# Patient Record
Sex: Female | Born: 1955
Health system: Southern US, Community
[De-identification: ages and names within clinical notes are randomized; demographics above are authoritative.]

## PROBLEM LIST (undated history)

## (undated) DIAGNOSIS — I10 Essential (primary) hypertension: Secondary | ICD-10-CM

## (undated) DIAGNOSIS — G43909 Migraine, unspecified, not intractable, without status migrainosus: Secondary | ICD-10-CM

## (undated) DIAGNOSIS — Z87442 Personal history of urinary calculi: Secondary | ICD-10-CM

## (undated) DIAGNOSIS — Z9989 Dependence on other enabling machines and devices: Secondary | ICD-10-CM

## (undated) DIAGNOSIS — Z800 Family history of malignant neoplasm of digestive organs: Secondary | ICD-10-CM

## (undated) DIAGNOSIS — F419 Anxiety disorder, unspecified: Secondary | ICD-10-CM

## (undated) DIAGNOSIS — C649 Malignant neoplasm of unspecified kidney, except renal pelvis: Secondary | ICD-10-CM

## (undated) DIAGNOSIS — R112 Nausea with vomiting, unspecified: Secondary | ICD-10-CM

## (undated) DIAGNOSIS — G4730 Sleep apnea, unspecified: Secondary | ICD-10-CM

## (undated) DIAGNOSIS — K649 Unspecified hemorrhoids: Secondary | ICD-10-CM

## (undated) DIAGNOSIS — Z8051 Family history of malignant neoplasm of kidney: Secondary | ICD-10-CM

## (undated) DIAGNOSIS — J45909 Unspecified asthma, uncomplicated: Secondary | ICD-10-CM

## (undated) DIAGNOSIS — Z808 Family history of malignant neoplasm of other organs or systems: Secondary | ICD-10-CM

## (undated) DIAGNOSIS — E785 Hyperlipidemia, unspecified: Secondary | ICD-10-CM

## (undated) DIAGNOSIS — N189 Chronic kidney disease, unspecified: Secondary | ICD-10-CM

## (undated) DIAGNOSIS — K219 Gastro-esophageal reflux disease without esophagitis: Secondary | ICD-10-CM

## (undated) DIAGNOSIS — R7303 Prediabetes: Secondary | ICD-10-CM

## (undated) DIAGNOSIS — M1990 Unspecified osteoarthritis, unspecified site: Secondary | ICD-10-CM

## (undated) DIAGNOSIS — D649 Anemia, unspecified: Secondary | ICD-10-CM

## (undated) DIAGNOSIS — Z9889 Other specified postprocedural states: Secondary | ICD-10-CM

## (undated) DIAGNOSIS — E042 Nontoxic multinodular goiter: Secondary | ICD-10-CM

## (undated) DIAGNOSIS — C73 Malignant neoplasm of thyroid gland: Secondary | ICD-10-CM

## (undated) DIAGNOSIS — K5792 Diverticulitis of intestine, part unspecified, without perforation or abscess without bleeding: Secondary | ICD-10-CM

## (undated) HISTORY — DX: Family history of malignant neoplasm of other organs or systems: Z80.8

## (undated) HISTORY — DX: Essential (primary) hypertension: I10

## (undated) HISTORY — DX: Nontoxic multinodular goiter: E04.2

## (undated) HISTORY — DX: Family history of malignant neoplasm of kidney: Z80.51

## (undated) HISTORY — DX: Malignant neoplasm of unspecified kidney, except renal pelvis: C64.9

## (undated) HISTORY — DX: Family history of malignant neoplasm of digestive organs: Z80.0

## (undated) HISTORY — DX: Malignant neoplasm of thyroid gland: C73

## (undated) HISTORY — PX: OTHER SURGICAL HISTORY: SHX169

## (undated) HISTORY — PX: NASAL SEPTUM SURGERY: SHX37

## (undated) HISTORY — PX: ABDOMINAL HYSTERECTOMY: SHX81

---

## 1976-09-10 HISTORY — PX: APPENDECTOMY: SHX54

## 1998-05-10 ENCOUNTER — Encounter: Payer: Self-pay | Admitting: *Deleted

## 1998-05-10 ENCOUNTER — Ambulatory Visit (HOSPITAL_COMMUNITY): Admission: RE | Admit: 1998-05-10 | Discharge: 1998-05-10 | Payer: Self-pay | Admitting: *Deleted

## 1998-05-10 ENCOUNTER — Encounter (HOSPITAL_COMMUNITY): Admission: RE | Admit: 1998-05-10 | Discharge: 1998-08-08 | Payer: Self-pay | Admitting: *Deleted

## 2000-07-26 ENCOUNTER — Ambulatory Visit (HOSPITAL_COMMUNITY): Admission: RE | Admit: 2000-07-26 | Discharge: 2000-07-26 | Payer: Self-pay | Admitting: *Deleted

## 2000-08-02 ENCOUNTER — Ambulatory Visit (HOSPITAL_COMMUNITY): Admission: RE | Admit: 2000-08-02 | Discharge: 2000-08-02 | Payer: Self-pay | Admitting: *Deleted

## 2003-09-11 HISTORY — PX: CHOLECYSTECTOMY: SHX55

## 2004-04-27 ENCOUNTER — Ambulatory Visit (HOSPITAL_COMMUNITY): Admission: RE | Admit: 2004-04-27 | Discharge: 2004-04-27 | Payer: Self-pay | Admitting: Internal Medicine

## 2004-05-01 ENCOUNTER — Observation Stay (HOSPITAL_COMMUNITY): Admission: RE | Admit: 2004-05-01 | Discharge: 2004-05-02 | Payer: Self-pay | Admitting: Internal Medicine

## 2004-07-20 ENCOUNTER — Ambulatory Visit (HOSPITAL_COMMUNITY): Admission: RE | Admit: 2004-07-20 | Discharge: 2004-07-20 | Payer: Self-pay | Admitting: Internal Medicine

## 2004-12-28 ENCOUNTER — Ambulatory Visit (HOSPITAL_COMMUNITY): Admission: RE | Admit: 2004-12-28 | Discharge: 2004-12-28 | Payer: Self-pay | Admitting: Internal Medicine

## 2005-05-03 ENCOUNTER — Ambulatory Visit (HOSPITAL_COMMUNITY): Admission: RE | Admit: 2005-05-03 | Discharge: 2005-05-03 | Payer: Self-pay | Admitting: Internal Medicine

## 2005-05-21 ENCOUNTER — Ambulatory Visit: Admission: RE | Admit: 2005-05-21 | Discharge: 2005-05-21 | Payer: Self-pay | Admitting: Internal Medicine

## 2005-05-29 ENCOUNTER — Ambulatory Visit: Payer: Self-pay | Admitting: Pulmonary Disease

## 2005-06-27 ENCOUNTER — Ambulatory Visit: Payer: Self-pay | Admitting: Pulmonary Disease

## 2005-07-25 ENCOUNTER — Ambulatory Visit: Payer: Self-pay | Admitting: Pulmonary Disease

## 2006-02-12 ENCOUNTER — Emergency Department (HOSPITAL_COMMUNITY): Admission: EM | Admit: 2006-02-12 | Discharge: 2006-02-12 | Payer: Self-pay | Admitting: Emergency Medicine

## 2006-05-16 ENCOUNTER — Ambulatory Visit (HOSPITAL_COMMUNITY): Admission: RE | Admit: 2006-05-16 | Discharge: 2006-05-16 | Payer: Self-pay | Admitting: Internal Medicine

## 2006-08-30 ENCOUNTER — Ambulatory Visit (HOSPITAL_COMMUNITY): Admission: RE | Admit: 2006-08-30 | Discharge: 2006-08-30 | Payer: Self-pay | Admitting: Internal Medicine

## 2007-06-05 ENCOUNTER — Ambulatory Visit (HOSPITAL_COMMUNITY): Admission: RE | Admit: 2007-06-05 | Discharge: 2007-06-05 | Payer: Self-pay | Admitting: Internal Medicine

## 2007-08-17 ENCOUNTER — Emergency Department (HOSPITAL_COMMUNITY): Admission: EM | Admit: 2007-08-17 | Discharge: 2007-08-17 | Payer: Self-pay | Admitting: Emergency Medicine

## 2008-06-10 ENCOUNTER — Ambulatory Visit (HOSPITAL_COMMUNITY): Admission: RE | Admit: 2008-06-10 | Discharge: 2008-06-10 | Payer: Self-pay | Admitting: Internal Medicine

## 2009-05-24 ENCOUNTER — Ambulatory Visit (HOSPITAL_COMMUNITY): Admission: RE | Admit: 2009-05-24 | Discharge: 2009-05-25 | Payer: Self-pay | Admitting: Urology

## 2010-07-03 ENCOUNTER — Ambulatory Visit (HOSPITAL_COMMUNITY): Admission: RE | Admit: 2010-07-03 | Discharge: 2010-07-03 | Payer: Self-pay | Admitting: Internal Medicine

## 2010-07-19 ENCOUNTER — Ambulatory Visit (HOSPITAL_COMMUNITY): Admission: RE | Admit: 2010-07-19 | Discharge: 2010-07-19 | Payer: Self-pay | Admitting: Internal Medicine

## 2010-10-01 ENCOUNTER — Encounter: Payer: Self-pay | Admitting: Internal Medicine

## 2010-12-15 LAB — CBC
HCT: 32.9 % — ABNORMAL LOW (ref 36.0–46.0)
Hemoglobin: 11.6 g/dL — ABNORMAL LOW (ref 12.0–15.0)
MCHC: 33.7 g/dL (ref 30.0–36.0)
MCHC: 34 g/dL (ref 30.0–36.0)
MCV: 86.7 fL (ref 78.0–100.0)
MCV: 87 fL (ref 78.0–100.0)
Platelets: 144 10*3/uL — ABNORMAL LOW (ref 150–400)
RBC: 3.78 MIL/uL — ABNORMAL LOW (ref 3.87–5.11)
RDW: 15.8 % — ABNORMAL HIGH (ref 11.5–15.5)

## 2010-12-15 LAB — ABO/RH: ABO/RH(D): O POS

## 2010-12-15 LAB — TYPE AND SCREEN
ABO/RH(D): O POS
Antibody Screen: NEGATIVE

## 2010-12-15 LAB — PROTIME-INR
INR: 0.9 (ref 0.00–1.49)
Prothrombin Time: 12.3 seconds (ref 11.6–15.2)

## 2010-12-15 LAB — BASIC METABOLIC PANEL: Creatinine, Ser: 0.7 mg/dL (ref 0.4–1.2)

## 2011-01-26 NOTE — Procedures (Signed)
Rebecca Scott, Rebecca Scott           ACCOUNT NO.:  [id number]   MEDICAL RECORD NO.:  [id number]          PATIENT TYPE:  OUT   LOCATION:  SLEEP LAB                     FACILITY:  [facility]   PHYSICIAN:  Marcelyn Bruins, M.D. [facility] DATE OF BIRTH:  02-13-1956   DATE OF STUDY:  05/21/2005                              NOCTURNAL POLYSOMNOGRAM   REFERRING PHYSICIAN:  Dr. Carylon Perches   DATE OF STUDY:  May 21, 2005   INDICATION FOR THE STUDY:  Hypersomnia with sleep apnea. Epworth score: 16.   SLEEP ARCHITECTURE:  The patient had a total sleep time of 276 minutes with  adequate slow wave sleep but significantly decreased REM. Sleep onset  latency was very prolonged at 64 minutes as was REM onset at 169 minutes.  Sleep efficiency was only 72%.   RESPIRATORY DATA:  Split night protocol revealed 51 obstructive events in  the first 96 minutes of sleep, which gave the patient a respiratory  disturbance index of 32 events per hour with O2 desaturation as low as 82%.  Events were not positional and were associated with loud snoring. By  protocol the patient was then placed on nasal pillows and CPAP pressure was  increased to control both events and snoring. At a final pressure of 8 cm  the patient had excellent control of both parameters.   OXYGEN DATA:  The patient had desaturation as low as 82% associated with her  obstructive events.   CARDIAC DATA:  No clinically significant cardiac arrhythmias.   MOVEMENT/PARASOMNIA:  The patient was found to have very large numbers of  leg jerks with 3.5 of them per hour causing arousal or awakening. It is  unclear how much of this may be due to the patient's obstructive sleep  apnea, and how it may be due to a secondary sleep disorder such as the  periodic leg movement syndrome. Clinical correlation is suggested if the  patient fails to respond appropriately to CPAP therapy.   IMPRESSION/RECOMMENDATION:  1.  Moderate obstructive sleep apnea with excellent  control with 8 cm of      continuous positive airway pressure delivered by nasal pillows.  2.  Frequent leg jerks with significant sleep disruption. Clinical      correlation is suggested if the patient fails to respond appropriately      to  continuous positive airway pressure.                                            ______________________________  Marcelyn Bruins, M.D. [facility]  Diplomate, American Board of Sleep  Medicine     KC/MEDQ  D:  05/29/2005 08:43:36  T:  05/29/2005 18:26:20  Job:  [id number]

## 2011-01-26 NOTE — Op Note (Signed)
NAME:  Rebecca Scott, Rebecca Scott                     ACCOUNT NO.:  [id number]   MEDICAL RECORD NO.:  [id number]                   PATIENT TYPE:  AMB   LOCATION:  DAY                                  FACILITY:  [facility]   PHYSICIAN:  Bernerd Limbo. Leona Carry, M.D.             DATE OF BIRTH:  1955/09/18   DATE OF PROCEDURE:  05/01/2004  DATE OF DISCHARGE:                                 OPERATIVE REPORT   PREOPERATIVE DIAGNOSIS:  Chronic cholecystitis.   POSTOPERATIVE DIAGNOSIS:  Chronic cholecystitis.   PROCEDURE:  Laparoscopic cholecystectomy.   COMPLICATIONS:  None.   SURGEON:  Bernerd Limbo. Leona Carry, M.D.   ASSISTANT:  Dalia Heading, M.D.   OPERATIVE PROCEDURE:  Under adequate general anesthesia the patient was  prepped and draped in the usual manner.  A small incision was made above the  umbilicus.  Through this incision, a Veress needle was inserted into the  peritoneal cavity and the abdomen then insufflated with 4-5 liters of CO2  and 15 mmHg.  The needle was removed and the trocar with the Vis-A-Port  attached was inserted into the peritoneal cavity under direct vision.   A small incision was then made about 2 cm below and to the right of the  xiphoid.  Through this opening a 10-mm trocar was inserted.  Through the 10-  mm port the dissecting instrument was inserted.  Two small incisions were  made on the right below the lowest rib.  The first about 2 cm below the  lowest rib and the second, 2 cm and lateral to the first.  Through these  openings 5-mm trocars were inserted.  Through the 5-mm ports the retracting  instruments were inserted.  The gallbladder was grasped with the tip and at  the infundibulum upward lateral traction placed on this structure.  There  were multiple adhesions around the gallbladder.  These were removed by blunt  traction and electrofulguration.  The gallbladder was also grasped at the  infundibulum and upward and lateral traction placed on it.  The cystic duct  was identified, mobilized, doubly clipped distally and singly clipped  proximally and then transected.   The scar tissue around the cystic duct was electrofulgurated.  The adhesions  were freed.  The cystic duct was then triply clipped distally, singly  clipped proximally and transected.  Then, utilizing electrocautery the  gallbladder was removed from below upwards with minimal difficulty.  The  gallbladder was then removed through the upper 10 mm port utilizing a bag.  A small piece of Surgicel was placed in the gallbladder bed and at the  completion of this portion of the operation all bleeding was under control.   The instruments were removed under direct vision.  The fascial layers were  closed with interrupted #0 Vicryl.  The skin and subcutaneous tissues were  closed with skin clips.  Telfa and OpSite dressings were applied.  The  patient tolerated the procedure nicely  and left the room in good condition.      ___________________________________________                                            Bernerd Limbo. Leona Carry, M.D.   NMD/MEDQ  D:  05/01/2004  T:  05/01/2004  Job:  [id number]

## 2011-01-26 NOTE — Procedures (Signed)
[name], [name]           ACCOUNT NO.:  [id number]   MEDICAL RECORD NO.:  [id number]         PATIENT TYPE:  REC   LOCATION:                                 FACILITY:   PHYSICIAN:  Kingsley Callander. Ouida Sills, MD            DATE OF BIRTH:   DATE OF PROCEDURE:  12/28/2004  DATE OF DISCHARGE:                                    STRESS TEST   Rebecca Scott exercised 9 minutes 15 seconds (15 seconds into stage IV of  the Bruce protocol), obtaining a maximal heart rate of 159 (92% of the age  predicted maximal heart rate) at a work load of 10.1 METs and discontinued  exercise due to leg fatigue.  She experienced mild chest pain at peak  exercise.  There were no arrhythmias.  There were no EKG changes diagnostic  of ischemia.  Her baseline EKG revealed normal sinus rhythm at 69 beats per  minute with nonspecific T-wave changes.   IMPRESSION:  No evidence of exercise-induced ischemia.  Myoview images  pending.      ROF/MEDQ  D:  12/28/2004  T:  12/28/2004  Job:  [id number]

## 2011-01-26 NOTE — H&P (Signed)
NAME:  Rebecca Scott, Rebecca Scott                     ACCOUNT NO.:  [id number]   MEDICAL RECORD NO.:  [id number]                   PATIENT TYPE:  AMB   LOCATION:  DAY                                  FACILITY:  [facility]   PHYSICIAN:  Bernerd Limbo. Leona Carry, M.D.             DATE OF BIRTH:  1956/04/13   DATE OF ADMISSION:  DATE OF DISCHARGE:                                HISTORY & PHYSICAL   HISTORY OF PRESENT ILLNESS:  This 55 year old white female is admitted to  this hospital for a laparoscopic cholecystectomy.   HISTORY OF PRESENT ILLNESS:  The patient gives a one to two year history of  recurring episodes of right upper quadrant pain radiating to the right  subscapular area.  There is associated belching, bloating, and excessive  flatus.  The symptoms have progressed in severity despite being given an  antiacid by her physician.   She did have a CT scan at [facility] that did reveal contracted  gallbladder.  Hepatobiliary scan was performed on the 18th of August.  It  did reveal a fairly good ejection fraction, but later in the day, the  patient developed severe symptoms of pain in the right upper quadrant with  radiation to the right subscapular area secondary to the ingestion of a  fatty meal.   PAST MEDICAL HISTORY:  The patient is a gravida 2, para 2, AB 0.  She has  had an appendectomy in 1978.  She had a hysterectomy in 1986 for  endometriosis.  She has been hospitalized on two occasions for kidney  stones.   ALLERGIES:  She is allergic to SULFA.   MEDICATIONS:  She presently is taking Evista.   PHYSICAL EXAMINATION:  GENERAL:  A healthy, 55 year old white female, in no  acute distress.  VITAL SIGNS:  Blood pressure 136/86.  Pulse 80.  Respirations 20.  HEENT:  Negative.  No evidence of any jaundice.  NECK:  Supple.  Thyroid is not enlarged.  No palpable cervical adenopathy.  CARDIOVASCULAR:  Regular sinus rhythm, no thrills or murmurs.  RESPIRATORY:  Chest clear to  percussion and auscultation.  BREASTS:  Moderate size, nipples symmetrical.  No abnormal masses.  ABDOMEN:  Slightly obese.  There is tenderness to deep palpation in the  right upper quadrant with radiation to the right subscapular area.  There is  normal peristalsis.  There is a healed Pfannenstiel operative scar.  PELVIC:  Examination was deferred.  RECTAL:  Negative.  BACK/LIMBS:  Negative.   ADMITTING DIAGNOSIS:  Chronic cholecystitis.   DISPOSITION:  The patient is admitted for a laparoscopic cholecystectomy.  The surgery, risks and complications and possible consequences have been  discussed with the patient and her husband.  They agree to the surgery.  She  has been scheduled for the 22nd of August.     ___________________________________________  Bernerd Limbo. Leona Carry, M.D.   NMD/MEDQ  D:  04/28/2004  T:  04/28/2004  Job:  [id number]

## 2011-06-26 ENCOUNTER — Other Ambulatory Visit (HOSPITAL_COMMUNITY): Payer: Self-pay | Admitting: Internal Medicine

## 2011-06-26 DIAGNOSIS — Z139 Encounter for screening, unspecified: Secondary | ICD-10-CM

## 2011-07-13 ENCOUNTER — Ambulatory Visit (HOSPITAL_COMMUNITY)
Admission: RE | Admit: 2011-07-13 | Discharge: 2011-07-13 | Disposition: A | Discharge status: Home / Self Care | Payer: BC Managed Care – PPO | Source: Ambulatory Visit | Attending: Internal Medicine | Admitting: Internal Medicine

## 2011-07-13 DIAGNOSIS — Z1231 Encounter for screening mammogram for malignant neoplasm of breast: Secondary | ICD-10-CM | POA: Insufficient documentation

## 2011-07-13 DIAGNOSIS — Z139 Encounter for screening, unspecified: Secondary | ICD-10-CM

## 2011-11-15 ENCOUNTER — Other Ambulatory Visit (HOSPITAL_COMMUNITY): Payer: Self-pay | Admitting: Internal Medicine

## 2011-11-15 ENCOUNTER — Ambulatory Visit (HOSPITAL_COMMUNITY)
Admission: RE | Admit: 2011-11-15 | Discharge: 2011-11-15 | Disposition: A | Discharge status: Home / Self Care | Payer: 59 | Source: Ambulatory Visit | Attending: Internal Medicine | Admitting: Internal Medicine

## 2011-11-15 DIAGNOSIS — R1031 Right lower quadrant pain: Secondary | ICD-10-CM | POA: Insufficient documentation

## 2011-11-15 DIAGNOSIS — N200 Calculus of kidney: Secondary | ICD-10-CM

## 2011-11-15 DIAGNOSIS — N1330 Unspecified hydronephrosis: Secondary | ICD-10-CM | POA: Insufficient documentation

## 2012-02-07 DIAGNOSIS — H029 Unspecified disorder of eyelid: Secondary | ICD-10-CM | POA: Insufficient documentation

## 2012-05-21 ENCOUNTER — Telehealth: Payer: Self-pay | Admitting: Pulmonary Disease

## 2012-05-21 NOTE — Telephone Encounter (Signed)
I spoke with pt and stated she wanted an rx sent to her DME for new cpap machien.s he has not seen [facility] since 2006. I advised her she was suppose to follow up after being set up on cpap. She stated she had no reason to follow up with him and did not want to see him. She requested we send her sleep study to her and she will get this through her pcp. She wanted this faxed to her at [phone]. I have done so. Nothing further was needed

## 2012-07-04 ENCOUNTER — Other Ambulatory Visit (HOSPITAL_COMMUNITY): Payer: Self-pay | Admitting: Internal Medicine

## 2012-07-04 DIAGNOSIS — Z139 Encounter for screening, unspecified: Secondary | ICD-10-CM

## 2012-07-21 ENCOUNTER — Ambulatory Visit (HOSPITAL_COMMUNITY)
Admission: RE | Admit: 2012-07-21 | Discharge: 2012-07-21 | Disposition: A | Discharge status: Home / Self Care | Payer: 59 | Source: Ambulatory Visit | Attending: Internal Medicine | Admitting: Internal Medicine

## 2012-07-21 DIAGNOSIS — Z1231 Encounter for screening mammogram for malignant neoplasm of breast: Secondary | ICD-10-CM | POA: Insufficient documentation

## 2012-07-21 DIAGNOSIS — Z139 Encounter for screening, unspecified: Secondary | ICD-10-CM

## 2013-06-30 ENCOUNTER — Other Ambulatory Visit (HOSPITAL_COMMUNITY): Payer: Self-pay | Admitting: Internal Medicine

## 2013-06-30 DIAGNOSIS — Z139 Encounter for screening, unspecified: Secondary | ICD-10-CM

## 2013-07-27 ENCOUNTER — Ambulatory Visit (HOSPITAL_COMMUNITY)
Admission: RE | Admit: 2013-07-27 | Discharge: 2013-07-27 | Disposition: A | Discharge status: Home / Self Care | Payer: 59 | Source: Ambulatory Visit | Attending: Internal Medicine | Admitting: Internal Medicine

## 2013-07-27 DIAGNOSIS — Z1231 Encounter for screening mammogram for malignant neoplasm of breast: Secondary | ICD-10-CM | POA: Insufficient documentation

## 2013-07-27 DIAGNOSIS — Z139 Encounter for screening, unspecified: Secondary | ICD-10-CM

## 2014-01-14 DIAGNOSIS — N390 Urinary tract infection, site not specified: Secondary | ICD-10-CM | POA: Insufficient documentation

## 2014-01-14 DIAGNOSIS — N952 Postmenopausal atrophic vaginitis: Secondary | ICD-10-CM | POA: Insufficient documentation

## 2014-01-14 DIAGNOSIS — N3946 Mixed incontinence: Secondary | ICD-10-CM | POA: Insufficient documentation

## 2014-04-08 ENCOUNTER — Encounter: Payer: Self-pay | Admitting: Pulmonary Disease

## 2014-07-27 ENCOUNTER — Encounter: Payer: Self-pay | Admitting: Cardiology

## 2014-07-29 ENCOUNTER — Ambulatory Visit (INDEPENDENT_AMBULATORY_CARE_PROVIDER_SITE_OTHER): Payer: 59 | Admitting: Cardiology

## 2014-07-29 ENCOUNTER — Encounter: Payer: Self-pay | Admitting: Cardiology

## 2014-07-29 ENCOUNTER — Other Ambulatory Visit: Payer: Self-pay | Admitting: *Deleted

## 2014-07-29 VITALS — BP 102/64 | HR 62 | Ht 63.0 in | Wt 203.0 lb

## 2014-07-29 DIAGNOSIS — E785 Hyperlipidemia, unspecified: Secondary | ICD-10-CM

## 2014-07-29 DIAGNOSIS — I10 Essential (primary) hypertension: Secondary | ICD-10-CM

## 2014-07-29 DIAGNOSIS — R55 Syncope and collapse: Secondary | ICD-10-CM | POA: Insufficient documentation

## 2014-07-29 HISTORY — DX: Essential (primary) hypertension: I10

## 2014-07-29 MED ORDER — LOSARTAN POTASSIUM 50 MG PO TABS: 50.0000 mg | ORAL_TABLET | Freq: Every day | ORAL | Status: DC

## 2014-07-29 MED ORDER — HYDROCHLOROTHIAZIDE 25 MG PO TABS: 25.0000 mg | ORAL_TABLET | ORAL | Status: DC | PRN

## 2014-07-29 NOTE — Progress Notes (Signed)
Clinical Summary Rebecca Scott is a 58 y.o.female seen today for follow up of the following medical problems.   1. Syncope - she reports approximately 8 episodes starting in July. Over the last few months her husband had taken ill and recently passed away, the onset of her syncopal episodes correlates with recent severe stress related to this.  - first episode July. Occurred just after coming home from hospital with her husband. She was standing make the bed when she lost consciousness. Denies any prodrome. Does not remember any specific symptoms prior. LOC for a few minutes.  - second episode a few weeks later. Occurred while walking out of church helping her husband. Does not recall prodrome at that time either - episode 2 weeks ago while laying in hospital bed, after standing had episode. At that time had some mild chest pain prior.  PMH 1. HTN 2. Hyperlipidemia   Allergies  Allergen Reactions  . Isovue [Iopamidol] Itching    And hives   . Sulfa Antibiotics Hives     Current Outpatient Prescriptions  Medication Sig Dispense Refill  . clonazePAM (KLONOPIN) 0.5 MG tablet Take 0.5 mg by mouth 2 (two) times daily as needed for anxiety.    . CRESTOR 5 MG tablet Take 1 tablet by mouth daily.    Marland Kitchen KLOR-CON M20 20 MEQ tablet Take 1 tablet by mouth daily.    . hydrochlorothiazide (HYDRODIURIL) 25 MG tablet Take 1 tablet (25 mg total) by mouth as needed (edema / swelling). 30 tablet 6  . losartan (COZAAR) 50 MG tablet Take 1 tablet (50 mg total) by mouth daily. 30 tablet 6   No current facility-administered medications for this visit.     No past surgical history on file.   Allergies  Allergen Reactions  . Isovue [Iopamidol] Itching    And hives   . Sulfa Antibiotics Hives      No family history on file.   Social History Ms. Elsea does not have a smoking history on file. She has never used smokeless tobacco. Ms. Mcbeth reports that she drinks about 1.8 oz of  alcohol per week.   Review of Systems CONSTITUTIONAL: No weight loss, fever, chills, weakness or fatigue.  HEENT: Eyes: No visual loss, blurred vision, double vision or yellow sclerae.No hearing loss, sneezing, congestion, runny nose or sore throat.  SKIN: No rash or itching.  CARDIOVASCULAR: per HPI RESPIRATORY: No shortness of breath, cough or sputum.  GASTROINTESTINAL: No anorexia, nausea, vomiting or diarrhea. No abdominal pain or blood.  GENITOURINARY: No burning on urination, no polyuria NEUROLOGICAL: per HPI MUSCULOSKELETAL: No muscle, back pain, joint pain or stiffness.  LYMPHATICS: No enlarged nodes. No history of splenectomy.  PSYCHIATRIC: No history of depression or anxiety.  ENDOCRINOLOGIC: No reports of sweating, cold or heat intolerance. No polyuria or polydipsia.  Marland Kitchen   Physical Examination Filed Vitals:   07/29/14 1042  BP: 102/64  Pulse: 62   Filed Weights   07/29/14 1042  Weight: 203 lb (92.08 kg)  Lying p 62 bp 111/70 Sitting p 62 bp 111/68 Standing p 71 bp 101/67    Gen: resting comfortably, no acute distress HEENT: no scleral icterus, pupils equal round and reactive, no palptable cervical adenopathy,  CV: RRR, no m/r/g,no JVD, no carotid bruits Resp: Clear to auscultation bilaterally GI: abdomen is soft, non-tender, non-distended, normal bowel sounds, no hepatosplenomegaly MSK: extremities are warm, no edema.  Skin: warm, no rash Neuro:  no focal deficits Psych:  appropriate affect   Diagnostic Studies  07/29/14 Clinic EKG SR, non-specific ST/T changes   Assessment and Plan   1. Syncope - onset of symptoms correlated with recent stress related to serious health problems of her husband and his recent death. - blood pressure is borderline low at rest, she has borderline orthostatic changes. Suspect possible syncope due to orthostatic hypotension, or potentially vasovagal etiology.  - will decrease losartan to 50mg  daily and make HCTZ prn  swelling only, encouraged increased oral intake. Will obtain echo to evaluate for any structural heart disease. Pending response to initial strategy she may warrant further cardiac testing. Counseled to avoid driving at this time.   F/u 4 weeks.    Arnoldo Lenis, M.D

## 2014-07-29 NOTE — Patient Instructions (Signed)
   Stop Hyzaar  Begin Losartan 50mg  daily  Begin HCTZ 25mg  as needed only for edema / swelling  Above medications sent to pharm. Continue all other medications.   Your physician has requested that you have an echocardiogram. Echocardiography is a painless test that uses sound waves to create images of your heart. It provides your doctor with information about the size and shape of your heart and how well your heart's chambers and valves are working. This procedure takes approximately one hour. There are no restrictions for this procedure. Office will contact with results via phone or letter.   Follow up in  4 weeks

## 2014-07-30 ENCOUNTER — Other Ambulatory Visit (HOSPITAL_COMMUNITY): Payer: Self-pay | Admitting: Internal Medicine

## 2014-07-30 DIAGNOSIS — Z139 Encounter for screening, unspecified: Secondary | ICD-10-CM

## 2014-08-02 ENCOUNTER — Ambulatory Visit (HOSPITAL_COMMUNITY)
Admission: RE | Admit: 2014-08-02 | Discharge: 2014-08-02 | Disposition: A | Discharge status: Home / Self Care | Payer: 59 | Source: Ambulatory Visit | Attending: Internal Medicine | Admitting: Internal Medicine

## 2014-08-02 DIAGNOSIS — Z1231 Encounter for screening mammogram for malignant neoplasm of breast: Secondary | ICD-10-CM | POA: Diagnosis present

## 2014-08-02 DIAGNOSIS — Z139 Encounter for screening, unspecified: Secondary | ICD-10-CM

## 2014-08-03 ENCOUNTER — Other Ambulatory Visit: Payer: Self-pay

## 2014-08-03 ENCOUNTER — Other Ambulatory Visit (INDEPENDENT_AMBULATORY_CARE_PROVIDER_SITE_OTHER): Payer: 59

## 2014-08-03 DIAGNOSIS — R55 Syncope and collapse: Secondary | ICD-10-CM

## 2014-08-03 DIAGNOSIS — I340 Nonrheumatic mitral (valve) insufficiency: Secondary | ICD-10-CM

## 2014-08-09 ENCOUNTER — Telehealth: Payer: Self-pay | Admitting: *Deleted

## 2014-08-09 NOTE — Telephone Encounter (Signed)
Notes Recorded by Laurine Blazer, LPN on 63/87/5643 at 2:36 PM Patient notified and verbalized understanding. Already has f/u scheduled for 08/26/14 with Dr. Harl Bowie.

## 2014-08-09 NOTE — Telephone Encounter (Signed)
-----   Message from Arnoldo Lenis, MD sent at 08/09/2014  1:23 PM EST ----- Overall her echo looks good, there are no abnormalities from the study to explain her passing out episodes. Will discuss more at follow up  Zandra Abts MD

## 2014-08-26 ENCOUNTER — Encounter: Payer: Self-pay | Admitting: Cardiology

## 2014-08-26 ENCOUNTER — Ambulatory Visit (INDEPENDENT_AMBULATORY_CARE_PROVIDER_SITE_OTHER): Payer: 59 | Admitting: Cardiology

## 2014-08-26 VITALS — BP 114/70 | HR 55 | Ht 63.0 in | Wt 197.0 lb

## 2014-08-26 DIAGNOSIS — R55 Syncope and collapse: Secondary | ICD-10-CM

## 2014-08-26 NOTE — Patient Instructions (Signed)
Continue all current medications. Your physician wants you to follow up in: 6 months.  You will receive a reminder letter in the mail one-two months in advance.  If you don't receive a letter, please call our office to schedule the follow up appointment   

## 2014-08-26 NOTE — Progress Notes (Signed)
Clinical Summary Ms. Delafuente is a 58 y.o.female seen today for follow up of the following medical problems.    1. Syncope - she reports approximately 8 episodes starting in July. Over the last few months her husband had taken ill and recently passed away, the onset of her syncopal episodes correlates with recent severe stress related to this.  - first episode July. Occurred just after coming home from hospital with her husband. She was standing make the bed when she lost consciousness. Denies any prodrome. Does not remember any specific symptoms prior. LOC for a few minutes.  - second episode a few weeks later. Occurred while walking out of church helping her husband. Does not recall prodrome at that time either - episode 2 weeks ago while laying in hospital bed, after standing had episode. At that time had some mild chest pain prior.   - since last visit completed echo that showed VLEF 55% , grade I diastolic dysfunction, no significant valvular pathology. We also decreased her losartan to 50mg  daily and changed her HCTZ to prn only. Since those changes has not had any syncopal episodes. She has had some dizzy spells, approx 2 per week.        PMH 1. HTN 2. Hyperlipidemia    Allergies  Allergen Reactions  . Isovue [Iopamidol] Itching    And hives   . Sulfa Antibiotics Hives     Current Outpatient Prescriptions  Medication Sig Dispense Refill  . clonazePAM (KLONOPIN) 0.5 MG tablet Take 0.5 mg by mouth 2 (two) times daily as needed for anxiety.    . CRESTOR 5 MG tablet Take 1 tablet by mouth daily.    . hydrochlorothiazide (HYDRODIURIL) 25 MG tablet Take 1 tablet (25 mg total) by mouth as needed (edema / swelling). 30 tablet 6  . KLOR-CON M20 20 MEQ tablet Take 1 tablet by mouth daily.    Marland Kitchen losartan (COZAAR) 50 MG tablet Take 1 tablet (50 mg total) by mouth daily. 30 tablet 6   No current facility-administered medications for this visit.     No past surgical  history on file.   Allergies  Allergen Reactions  . Isovue [Iopamidol] Itching    And hives   . Sulfa Antibiotics Hives      No family history on file.   Social History Ms. Marney has an unknown smoking status. She has never used smokeless tobacco. Ms. Toft reports that she drinks about 1.8 oz of alcohol per week.   Review of Systems CONSTITUTIONAL: No weight loss, fever, chills, weakness or fatigue.  HEENT: Eyes: No visual loss, blurred vision, double vision or yellow sclerae.No hearing loss, sneezing, congestion, runny nose or sore throat.  SKIN: No rash or itching.  CARDIOVASCULAR: per HPI RESPIRATORY: No shortness of breath, cough or sputum.  GASTROINTESTINAL: No anorexia, nausea, vomiting or diarrhea. No abdominal pain or blood.  GENITOURINARY: No burning on urination, no polyuria NEUROLOGICAL: + dizziness  MUSCULOSKELETAL: No muscle, back pain, joint pain or stiffness.  LYMPHATICS: No enlarged nodes. No history of splenectomy.  PSYCHIATRIC: No history of depression or anxiety.  ENDOCRINOLOGIC: No reports of sweating, cold or heat intolerance. No polyuria or polydipsia.  Marland Kitchen   Physical Examination p 55 bp 114/70 Gen: resting comfortably, no acute distress HEENT: no scleral icterus, pupils equal round and reactive, no palptable cervical adenopathy,  CV: RRR, no m/r/g, no JVD, no carotid bruits Resp: Clear to auscultation bilaterally GI: abdomen is soft, non-tender, non-distended, normal bowel sounds,  no hepatosplenomegaly MSK: extremities are warm, no edema.  Skin: warm, no rash Neuro:  no focal deficits Psych: appropriate affect   Diagnostic Studies Echo 07/2014  Study Conclusions  - Left ventricle: The cavity size was normal. Wall thickness was normal. The estimated ejection fraction was 55%. Wall motion was normal; there were no regional wall motion abnormalities. Doppler parameters are consistent with abnormal left ventricular relaxation (grade  1 diastolic dysfunction). - Aortic valve: There was trivial regurgitation. - Mitral valve: Mildly thickened leaflets . Mild systolic bowing without prolapse, involving the anterior leaflet. There was mild regurgitation directed posteriorly. - Left atrium: The atrium was moderately dilated. - Right atrium: Central venous pressure (est): 3 mm Hg. - Atrial septum: The septum bowed from left to right, consistent with increased left atrial pressure. - Tricuspid valve: There was trivial regurgitation. - Pulmonary arteries: PA peak pressure: 23 mm Hg (S). - Pericardium, extracardiac: A prominent pericardial fat pad was present.  Impressions:  - Normal LV wall thickness and chamber size with LVEF approximately 10%, grade 1 diastolic dysfunction. Mildly thickened mitral leaflets with bowing of the anterior leaflet and mild posteriorly directed mitral regurgitation. Moderate left atrial enlargement. Trivial tricuspid regurgitation with PASP 23 mmHg.    Assessment and Plan   1. Syncope - onset of symptoms correlated with recent stress related to serious health problems of her husband and his recent death. - she had borderline orthostatic changes at last visit. Symptoms improved after decreasing her bp meds. Still with some dizzy spells, discussed decreasing her ARB further however she is reluctant at this time and wishes to continue current doses - continue to follow clinically, if recurrence of episodes consider home monitor.       Arnoldo Lenis, M.D.

## 2015-03-04 ENCOUNTER — Other Ambulatory Visit: Payer: Self-pay | Admitting: *Deleted

## 2015-03-04 MED ORDER — LOSARTAN POTASSIUM 50 MG PO TABS: 50.0000 mg | ORAL_TABLET | Freq: Every day | ORAL | Status: DC

## 2015-04-06 ENCOUNTER — Other Ambulatory Visit: Payer: Self-pay | Admitting: Cardiology

## 2015-07-20 ENCOUNTER — Other Ambulatory Visit (HOSPITAL_COMMUNITY): Payer: Self-pay | Admitting: Internal Medicine

## 2015-07-20 DIAGNOSIS — Z1231 Encounter for screening mammogram for malignant neoplasm of breast: Secondary | ICD-10-CM

## 2015-08-10 ENCOUNTER — Ambulatory Visit (HOSPITAL_COMMUNITY)
Admission: RE | Admit: 2015-08-10 | Discharge: 2015-08-10 | Disposition: A | Discharge status: Home / Self Care | Payer: 59 | Source: Ambulatory Visit | Attending: Internal Medicine | Admitting: Internal Medicine

## 2015-08-10 DIAGNOSIS — Z1231 Encounter for screening mammogram for malignant neoplasm of breast: Secondary | ICD-10-CM | POA: Diagnosis not present

## 2015-09-18 ENCOUNTER — Emergency Department (HOSPITAL_COMMUNITY)
Admission: EM | Admit: 2015-09-18 | Discharge: 2015-09-18 | Disposition: A | Discharge status: Home / Self Care | Payer: 59 | Attending: Emergency Medicine | Admitting: Emergency Medicine

## 2015-09-18 ENCOUNTER — Other Ambulatory Visit: Payer: Self-pay

## 2015-09-18 ENCOUNTER — Emergency Department (HOSPITAL_COMMUNITY): Payer: 59

## 2015-09-18 ENCOUNTER — Encounter (HOSPITAL_COMMUNITY): Payer: Self-pay | Admitting: Emergency Medicine

## 2015-09-18 DIAGNOSIS — Z792 Long term (current) use of antibiotics: Secondary | ICD-10-CM | POA: Insufficient documentation

## 2015-09-18 DIAGNOSIS — G4730 Sleep apnea, unspecified: Secondary | ICD-10-CM | POA: Diagnosis not present

## 2015-09-18 DIAGNOSIS — Z79899 Other long term (current) drug therapy: Secondary | ICD-10-CM | POA: Diagnosis not present

## 2015-09-18 DIAGNOSIS — Z7952 Long term (current) use of systemic steroids: Secondary | ICD-10-CM | POA: Diagnosis not present

## 2015-09-18 DIAGNOSIS — J45901 Unspecified asthma with (acute) exacerbation: Secondary | ICD-10-CM | POA: Diagnosis not present

## 2015-09-18 DIAGNOSIS — Z9981 Dependence on supplemental oxygen: Secondary | ICD-10-CM | POA: Insufficient documentation

## 2015-09-18 DIAGNOSIS — Z7982 Long term (current) use of aspirin: Secondary | ICD-10-CM | POA: Insufficient documentation

## 2015-09-18 DIAGNOSIS — I10 Essential (primary) hypertension: Secondary | ICD-10-CM | POA: Insufficient documentation

## 2015-09-18 DIAGNOSIS — E785 Hyperlipidemia, unspecified: Secondary | ICD-10-CM | POA: Diagnosis not present

## 2015-09-18 DIAGNOSIS — R05 Cough: Secondary | ICD-10-CM | POA: Diagnosis present

## 2015-09-18 HISTORY — DX: Hyperlipidemia, unspecified: E78.5

## 2015-09-18 HISTORY — DX: Unspecified asthma, uncomplicated: J45.909

## 2015-09-18 HISTORY — DX: Essential (primary) hypertension: I10

## 2015-09-18 HISTORY — DX: Sleep apnea, unspecified: G47.30

## 2015-09-18 HISTORY — DX: Dependence on other enabling machines and devices: Z99.89

## 2015-09-18 MED ORDER — HYDROCOD POLST-CPM POLST ER 10-8 MG/5ML PO SUER
5.0000 mL | Freq: Once | ORAL | Status: AC
  Administered 2015-09-18: 5 mL via ORAL
  Filled 2015-09-18: qty 5

## 2015-09-18 MED ORDER — PREDNISONE 10 MG PO TABS: 20.0000 mg | ORAL_TABLET | Freq: Two times a day (BID) | ORAL | Status: DC

## 2015-09-18 MED ORDER — HYDROCOD POLST-CPM POLST ER 10-8 MG/5ML PO SUER: 5.0000 mL | Freq: Two times a day (BID) | ORAL | Status: DC | PRN

## 2015-09-18 MED ORDER — ALBUTEROL SULFATE (2.5 MG/3ML) 0.083% IN NEBU
5.0000 mg | INHALATION_SOLUTION | Freq: Once | RESPIRATORY_TRACT | Status: AC
  Administered 2015-09-18: 5 mg via RESPIRATORY_TRACT
  Filled 2015-09-18: qty 6

## 2015-09-18 MED ORDER — ALBUTEROL SULFATE HFA 108 (90 BASE) MCG/ACT IN AERS
2.0000 | INHALATION_SPRAY | RESPIRATORY_TRACT | Status: DC | PRN
  Administered 2015-09-18: 2 via RESPIRATORY_TRACT
  Filled 2015-09-18: qty 6.7

## 2015-09-18 MED ORDER — IPRATROPIUM-ALBUTEROL 0.5-2.5 (3) MG/3ML IN SOLN
3.0000 mL | Freq: Once | RESPIRATORY_TRACT | Status: AC
  Administered 2015-09-18: 3 mL via RESPIRATORY_TRACT
  Filled 2015-09-18: qty 3

## 2015-09-18 MED ORDER — PREDNISONE 10 MG PO TABS
60.0000 mg | ORAL_TABLET | Freq: Once | ORAL | Status: AC
  Administered 2015-09-18: 60 mg via ORAL
  Filled 2015-09-18: qty 1

## 2015-09-18 NOTE — ED Notes (Signed)
PT states she was tx with tamiflu on 09/09/15 and started on zpack on 09/13/15 and continues to have "rattling" in her chest with SOB on exertion and with productive thick white mucus. PT states she took mucus relief medication today at 1300.

## 2015-09-18 NOTE — Discharge Instructions (Signed)
Follow up with Dr. Willey Blade. Return here for worsening symptoms.

## 2015-09-18 NOTE — ED Provider Notes (Signed)
CSN: [id number]     Arrival date & time 09/18/15  1836 History   First MD Initiated Contact with Patient 09/18/15 1942     Chief Complaint  Patient presents with  . Shortness of Breath     (Consider location/radiation/quality/duration/timing/severity/associated sxs/prior Treatment) Patient is a 60 y.o. female presenting with cough. The history is provided by the patient.  Cough Cough characteristics:  Productive Sputum characteristics:  White Severity:  Moderate Onset quality:  Gradual Duration:  3 weeks Progression:  Unchanged Chronicity:  New Smoker: no   Relieved by:  Nothing Worsened by:  Lying down Associated symptoms: shortness of breath    Rebecca Scott is a 60 y.o. female who presents to the ED with cough and shortness of breath. She reports being treated with Tamiflu 09/09/15 and ten a Z-Pak 09/13/15. She reports that she continues to feel short of breath and has a rattling in her chest. She has a productive cough with thick white mucous. She tood mucus relief today without relief. Patient reports feeling like she has a vibrator in her chest and that she has to sit up to sleep. She has had asthma in the past but only a couple episodes as an adult. She does not have an inhaler or medication for asthma.   Past Medical History  Diagnosis Date  . Hypertension   . Hyperlipemia   . Sleep apnea   . CPAP (continuous positive airway pressure) dependence   . Asthma    Past Surgical History  Procedure Laterality Date  . Abdominal hysterectomy    . Appendectomy    . Nasal septum surgery     History reviewed. No pertinent family history. Social History  Substance Use Topics  . Smoking status: Never Smoker   . Smokeless tobacco: Never Used  . Alcohol Use: No   OB History    Gravida Para Term Preterm AB TAB SAB Ectopic Multiple Living            1     Review of Systems  HENT: Positive for congestion.   Respiratory: Positive for cough, chest tightness and shortness of  breath.   all other systems negative    Allergies  Isovue; Sulfa antibiotics; and Macrobid  Home Medications   Prior to Admission medications   Medication Sig Start Date End Date Taking? Authorizing Provider  aspirin EC 81 MG tablet Take 81 mg by mouth daily.    Yes Historical Provider, MD  clonazePAM (KLONOPIN) 0.5 MG tablet Take 0.5 mg by mouth 2 (two) times daily as needed for anxiety.   Yes Historical Provider, MD  glucosamine-chondroitin (GLUCOSAMINE-CHONDROITIN DS) 500-400 MG tablet Take 1 tablet by mouth daily.    Yes Historical Provider, MD  hydrochlorothiazide (HYDRODIURIL) 25 MG tablet Take 1 tablet (25 mg total) by mouth as needed (edema / swelling). Patient taking differently: Take 25 mg by mouth every other day.  07/29/14  Yes Arnoldo Lenis, MD  losartan (COZAAR) 50 MG tablet Take 1 tablet (50 mg total) by mouth daily. 03/04/15  Yes Arnoldo Lenis, MD  Multiple Vitamin (MULTI-VITAMINS) TABS Take 1 tablet by mouth daily.    Yes Historical Provider, MD  Phenylephrine-DM-GG-APAP (MUCINEX FAST-MAX SEVERE COLD) 5-10-200-325 MG/10ML LIQD Take 20 mLs by mouth daily as needed (for cough).   Yes Historical Provider, MD  rosuvastatin (CRESTOR) 5 MG tablet Take 5 mg by mouth daily.  08/18/15  Yes Historical Provider, MD  traZODone (DESYREL) 50 MG tablet  08/18/15  Yes Historical  Provider, MD  azithromycin (ZITHROMAX) 250 MG tablet Take 250-500 mg by mouth See admin instructions. Reported on 09/18/2015    Historical Provider, MD  chlorpheniramine-HYDROcodone (TUSSIONEX PENNKINETIC ER) 10-8 MG/5ML SUER Take 5 mLs by mouth every 12 (twelve) hours as needed for cough. 09/18/15   Nyjai Graff Bunnie Pion, NP  predniSONE (DELTASONE) 10 MG tablet Take 2 tablets (20 mg total) by mouth 2 (two) times daily with a meal. 09/18/15   Jamarkus [place] Bunnie Pion, NP  TAMIFLU 75 MG capsule Take 75 mg by mouth 2 (two) times daily. Reported on 09/18/2015 09/09/15   Historical Provider, MD   BP 124/52 mmHg  Pulse 72  Temp(Src) 98.3 F  (36.8 C) (Oral)  Resp 14  Ht 5\' 3"  (1.6 m)  Wt 90.719 kg  BMI 35.44 kg/m2  SpO2 99% Physical Exam  Constitutional: She is oriented to person, place, and time. She appears well-developed and well-nourished.  HENT:  Head: Normocephalic.  Eyes: Conjunctivae and EOM are normal.  Neck: Neck supple.  Cardiovascular: Normal rate.   Pulmonary/Chest: No respiratory distress. She has decreased breath sounds in the right lower field and the left lower field. She has wheezes.  Abdominal: Soft. There is no tenderness.  Musculoskeletal: Normal range of motion.  Neurological: She is alert and oriented to person, place, and time. No cranial nerve deficit.  Skin: Skin is warm and dry.  Psychiatric: She has a normal mood and affect. Her behavior is normal.  Nursing note and vitals reviewed.   ED Course  Procedures (including critical care time) CXR, Duoneb, Prednisone 60 mg PO Tussionex  Labs Review Labs Reviewed - No data to display  Imaging Review Dg Chest 2 View  09/18/2015  CLINICAL DATA:  Shortness of breath for 3 days.  Cough. EXAM: CHEST  2 VIEW COMPARISON:  05/20/2009 FINDINGS: The cardiomediastinal contours are normal. Mild diffuse interstitial thickening. Subsegmental atelectasis or scarring in the left midlung zone, unchanged. Pulmonary vasculature is normal. No consolidation, pleural effusion, or pneumothorax. No acute osseous abnormalities are seen. IMPRESSION: Mild diffuse interstitial thickening, may reflect bronchitis or asthma. Electronically Signed   By: Jeb Levering M.D.   On: 09/18/2015 19:47   After breathing treatment patient states that she does not feel a lot different.  Re examined and there is improvement in air movement.   Dr. Alvino Chapel in to examine the patient and discuss plan of care with the patient.  MDM  60 y.o. female with cough and wheezing and flu like symptoms that have been persistent since 09/09/15. Stable for d/c without respiratory distress, O2 SAT  99% on R/A. Will give Rx for prednisone burst and she will use albuterol inhaler as needed. Will give Rx for Tussionex and she will follow up with her PCP or return here for worsening symptoms. X-ray results reviewed and discussed with the patient.  Final diagnoses:  Reactive airway disease with wheezing with acute exacerbation      Ashley Murrain, NP 09/19/15 [id number]  Davonna Belling, MD 09/22/15 [phone]

## 2016-01-06 ENCOUNTER — Ambulatory Visit (HOSPITAL_COMMUNITY)
Admission: RE | Admit: 2016-01-06 | Discharge: 2016-01-06 | Disposition: A | Discharge status: Home / Self Care | Payer: 59 | Source: Ambulatory Visit | Attending: Internal Medicine | Admitting: Internal Medicine

## 2016-01-06 ENCOUNTER — Other Ambulatory Visit (HOSPITAL_COMMUNITY): Payer: Self-pay | Admitting: Internal Medicine

## 2016-01-06 DIAGNOSIS — M545 Low back pain: Secondary | ICD-10-CM | POA: Insufficient documentation

## 2016-01-06 DIAGNOSIS — M5136 Other intervertebral disc degeneration, lumbar region: Secondary | ICD-10-CM | POA: Diagnosis not present

## 2016-08-21 ENCOUNTER — Other Ambulatory Visit (HOSPITAL_COMMUNITY): Payer: Self-pay | Admitting: Internal Medicine

## 2016-08-21 DIAGNOSIS — Z1231 Encounter for screening mammogram for malignant neoplasm of breast: Secondary | ICD-10-CM

## 2016-08-22 ENCOUNTER — Ambulatory Visit (HOSPITAL_COMMUNITY)
Admission: RE | Admit: 2016-08-22 | Discharge: 2016-08-22 | Disposition: A | Discharge status: Home / Self Care | Payer: 59 | Source: Ambulatory Visit | Attending: Internal Medicine | Admitting: Internal Medicine

## 2016-08-22 DIAGNOSIS — Z1231 Encounter for screening mammogram for malignant neoplasm of breast: Secondary | ICD-10-CM | POA: Diagnosis present

## 2016-12-17 ENCOUNTER — Other Ambulatory Visit (HOSPITAL_COMMUNITY): Payer: Self-pay | Admitting: Internal Medicine

## 2016-12-17 ENCOUNTER — Ambulatory Visit (HOSPITAL_COMMUNITY)
Admission: RE | Admit: 2016-12-17 | Discharge: 2016-12-17 | Disposition: A | Discharge status: Home / Self Care | Payer: 59 | Source: Ambulatory Visit | Attending: Internal Medicine | Admitting: Internal Medicine

## 2016-12-17 DIAGNOSIS — R05 Cough: Secondary | ICD-10-CM

## 2016-12-17 DIAGNOSIS — R059 Cough, unspecified: Secondary | ICD-10-CM

## 2017-02-09 ENCOUNTER — Emergency Department (HOSPITAL_COMMUNITY): Payer: 59

## 2017-02-09 ENCOUNTER — Emergency Department (HOSPITAL_COMMUNITY)
Admission: EM | Admit: 2017-02-09 | Discharge: 2017-02-09 | Disposition: A | Discharge status: Home / Self Care | Payer: 59 | Attending: Emergency Medicine | Admitting: Emergency Medicine

## 2017-02-09 ENCOUNTER — Encounter (HOSPITAL_COMMUNITY): Payer: Self-pay | Admitting: Emergency Medicine

## 2017-02-09 DIAGNOSIS — Z7982 Long term (current) use of aspirin: Secondary | ICD-10-CM | POA: Insufficient documentation

## 2017-02-09 DIAGNOSIS — K5792 Diverticulitis of intestine, part unspecified, without perforation or abscess without bleeding: Secondary | ICD-10-CM | POA: Diagnosis not present

## 2017-02-09 DIAGNOSIS — J45909 Unspecified asthma, uncomplicated: Secondary | ICD-10-CM | POA: Diagnosis not present

## 2017-02-09 DIAGNOSIS — R52 Pain, unspecified: Secondary | ICD-10-CM

## 2017-02-09 DIAGNOSIS — Z79899 Other long term (current) drug therapy: Secondary | ICD-10-CM | POA: Insufficient documentation

## 2017-02-09 DIAGNOSIS — I10 Essential (primary) hypertension: Secondary | ICD-10-CM | POA: Diagnosis not present

## 2017-02-09 DIAGNOSIS — R300 Dysuria: Secondary | ICD-10-CM | POA: Diagnosis present

## 2017-02-09 LAB — COMPREHENSIVE METABOLIC PANEL
ALT: 25 U/L (ref 14–54)
ANION GAP: 8 (ref 5–15)
AST: 23 U/L (ref 15–41)
Albumin: 3.9 g/dL (ref 3.5–5.0)
Alkaline Phosphatase: 94 U/L (ref 38–126)
BILIRUBIN TOTAL: 0.8 mg/dL (ref 0.3–1.2)
BUN: 9 mg/dL (ref 6–20)
CHLORIDE: 107 mmol/L (ref 101–111)
CO2: 29 mmol/L (ref 22–32)
Calcium: 9.5 mg/dL (ref 8.9–10.3)
Creatinine, Ser: 0.67 mg/dL (ref 0.44–1.00)
Glucose, Bld: 87 mg/dL (ref 65–99)
POTASSIUM: 3.2 mmol/L — AB (ref 3.5–5.1)
Sodium: 144 mmol/L (ref 135–145)
TOTAL PROTEIN: 7.6 g/dL (ref 6.5–8.1)

## 2017-02-09 LAB — CBC WITH DIFFERENTIAL/PLATELET
BASOS ABS: 0 10*3/uL (ref 0.0–0.1)
Basophils Relative: 1 %
EOS PCT: 6 %
Eosinophils Absolute: 0.3 10*3/uL (ref 0.0–0.7)
HEMATOCRIT: 35.5 % — AB (ref 36.0–46.0)
Hemoglobin: 11.1 g/dL — ABNORMAL LOW (ref 12.0–15.0)
Lymphocytes Relative: 23 %
Lymphs Abs: 1.4 10*3/uL (ref 0.7–4.0)
MCH: 27.2 pg (ref 26.0–34.0)
MCHC: 31.3 g/dL (ref 30.0–36.0)
MCV: 87 fL (ref 78.0–100.0)
MONO ABS: 0.5 10*3/uL (ref 0.1–1.0)
MONOS PCT: 8 %
NEUTROS ABS: 3.8 10*3/uL (ref 1.7–7.7)
Neutrophils Relative %: 64 %
PLATELETS: 154 10*3/uL (ref 150–400)
RBC: 4.08 MIL/uL (ref 3.87–5.11)
RDW: 15.5 % (ref 11.5–15.5)
WBC: 6 10*3/uL (ref 4.0–10.5)

## 2017-02-09 LAB — URINALYSIS, ROUTINE W REFLEX MICROSCOPIC
Bilirubin Urine: NEGATIVE
GLUCOSE, UA: NEGATIVE mg/dL
HGB URINE DIPSTICK: NEGATIVE
KETONES UR: NEGATIVE mg/dL
Leukocytes, UA: NEGATIVE
Nitrite: NEGATIVE
PH: 7 (ref 5.0–8.0)
PROTEIN: NEGATIVE mg/dL
Specific Gravity, Urine: 1.004 — ABNORMAL LOW (ref 1.005–1.030)

## 2017-02-09 MED ORDER — HYDROMORPHONE HCL 1 MG/ML IJ SOLN
1.0000 mg | Freq: Once | INTRAMUSCULAR | Status: AC
  Administered 2017-02-09: 1 mg via INTRAVENOUS
  Filled 2017-02-09: qty 1

## 2017-02-09 MED ORDER — METRONIDAZOLE 500 MG PO TABS: ORAL_TABLET | ORAL | 0 refills | Status: DC

## 2017-02-09 MED ORDER — HYDROCODONE-ACETAMINOPHEN 5-325 MG PO TABS: 1.0000 | ORAL_TABLET | Freq: Four times a day (QID) | ORAL | 0 refills | Status: DC | PRN

## 2017-02-09 MED ORDER — ONDANSETRON HCL 4 MG/2ML IJ SOLN: 4.0000 mg | Freq: Once | INTRAMUSCULAR | Status: DC

## 2017-02-09 MED ORDER — CIPROFLOXACIN HCL 500 MG PO TABS: ORAL_TABLET | ORAL | 0 refills | Status: DC

## 2017-02-09 MED ORDER — METRONIDAZOLE IN NACL 5-0.79 MG/ML-% IV SOLN
500.0000 mg | Freq: Once | INTRAVENOUS | Status: AC
  Administered 2017-02-09: 500 mg via INTRAVENOUS
  Filled 2017-02-09: qty 100

## 2017-02-09 MED ORDER — ONDANSETRON 4 MG PO TBDP: ORAL_TABLET | ORAL | 0 refills | Status: DC

## 2017-02-09 MED ORDER — CIPROFLOXACIN IN D5W 400 MG/200ML IV SOLN
400.0000 mg | Freq: Once | INTRAVENOUS | Status: AC
  Administered 2017-02-09: 400 mg via INTRAVENOUS
  Filled 2017-02-09: qty 200

## 2017-02-09 MED ORDER — ONDANSETRON HCL 4 MG/2ML IJ SOLN
4.0000 mg | Freq: Once | INTRAMUSCULAR | Status: AC
  Administered 2017-02-09: 4 mg via INTRAVENOUS
  Filled 2017-02-09: qty 2

## 2017-02-09 MED ORDER — HYDROMORPHONE HCL 1 MG/ML IJ SOLN
1.0000 mg | Freq: Once | INTRAMUSCULAR | Status: DC
Start: 2017-02-09 — End: 2017-02-09

## 2017-02-09 NOTE — ED Provider Notes (Signed)
[facility] DEPT Provider Note   CSN: [id number] Arrival date & time: 02/09/17  1103     History   Chief Complaint Chief Complaint  Patient presents with  . Dysuria    HPI Rebecca Scott is a 61 y.o. female.  Patient states that for 2 days she's had some dysuria and pain suprapubic. She started himself on Macrodantin yesterday. No fever no chills no vomiting   The history is provided by the patient. No language interpreter was used.  Dysuria   This is a recurrent problem. The current episode started 2 days ago. The problem occurs every urination. The problem has not changed since onset.The quality of the pain is described as burning. The pain is at a severity of 4/10. The pain is moderate. There has been no fever. Pertinent negatives include no chills, no frequency and no hematuria.    Past Medical History:  Diagnosis Date  . Asthma   . CPAP (continuous positive airway pressure) dependence   . Hyperlipemia   . Hypertension   . Sleep apnea     Patient Active Problem List   Diagnosis Date Noted  . HTN (hypertension) 07/29/2014  . Hyperlipidemia 07/29/2014  . Syncope 07/29/2014    Past Surgical History:  Procedure Laterality Date  . ABDOMINAL HYSTERECTOMY    . APPENDECTOMY    . NASAL SEPTUM SURGERY      OB History    Gravida Para Term Preterm AB Living             1   SAB TAB Ectopic Multiple Live Births                   Home Medications    Prior to Admission medications   Medication Sig Start Date End Date Taking? Authorizing Provider  aspirin EC 81 MG tablet Take 81 mg by mouth daily.    Yes [provider]  clonazePAM (KLONOPIN) 0.5 MG tablet Take 0.5 mg by mouth 2 (two) times daily as needed for anxiety.   Yes [provider]  esomeprazole (NEXIUM) 40 MG capsule Take 40 mg by mouth daily.   Yes [provider]  glucosamine-chondroitin (GLUCOSAMINE-CHONDROITIN DS) 500-400 MG tablet Take 1 tablet by mouth daily.    Yes  [provider]  losartan-hydrochlorothiazide (HYZAAR) 100-25 MG tablet Take 1 tablet by mouth daily. 01/15/17  Yes [provider]  MAGNESIUM CARBONATE PO Take 1 tablet by mouth daily.   Yes [provider]  Multiple Vitamin (MULTI-VITAMINS) TABS Take 1 tablet by mouth daily.    Yes [provider]  rosuvastatin (CRESTOR) 5 MG tablet Take 5 mg by mouth daily.  08/18/15  Yes [provider]    Family History History reviewed. No pertinent family history.  Social History Social History  Substance Use Topics  . Smoking status: Never Smoker  . Smokeless tobacco: Never Used  . Alcohol use No     Allergies   Iodinated diagnostic agents; Isovue [iopamidol]; Nitrofurantoin; Sulfa antibiotics; Macrobid [nitrofurantoin monohyd macro]; and Regulatory affairs officer   Review of Systems Review of Systems  Constitutional: Negative for appetite change, chills and fatigue.  HENT: Negative for congestion, ear discharge and sinus pressure.   Eyes: Negative for discharge.  Respiratory: Negative for cough.   Cardiovascular: Negative for chest pain.  Gastrointestinal: Positive for abdominal pain. Negative for diarrhea.  Genitourinary: Positive for dysuria. Negative for frequency and hematuria.  Musculoskeletal: Negative for back pain.  Skin: Negative for rash.  Neurological: Negative  for seizures and headaches.  Psychiatric/Behavioral: Negative for hallucinations.     Physical Exam Updated Vital Signs BP 110/68   Pulse 60   Temp 98.1 F (36.7 C) (Oral)   Resp 16   Ht 5\' 3"  (1.6 m)   Wt 93 kg (205 lb)   SpO2 98%   BMI 36.31 kg/m   Physical Exam  Constitutional: She is oriented to person, place, and time. She appears well-developed.  HENT:  Head: Normocephalic.  Eyes: Conjunctivae and EOM are normal. No scleral icterus.  Neck: Neck supple. No thyromegaly present.  Cardiovascular: Normal rate and regular rhythm.  Exam reveals no gallop and no friction  rub.   No murmur heard. Pulmonary/Chest: No stridor. She has no wheezes. She has no rales. She exhibits no tenderness.  Abdominal: She exhibits no distension. There is no tenderness. There is no rebound.  Musculoskeletal: Normal range of motion. She exhibits no edema.  Lymphadenopathy:    She has no cervical adenopathy.  Neurological: She is oriented to person, place, and time. She exhibits normal muscle tone. Coordination normal.  Skin: No rash noted. No erythema.  Psychiatric: She has a normal mood and affect. Her behavior is normal.     ED Treatments / Results  Labs (all labs ordered are listed, but only abnormal results are displayed) Labs Reviewed  URINALYSIS, ROUTINE W REFLEX MICROSCOPIC - Abnormal; Notable for the following:       Result Value   Color, Urine STRAW (*)    Specific Gravity, Urine 1.004 (*)    All other components within normal limits  CBC WITH DIFFERENTIAL/PLATELET  COMPREHENSIVE METABOLIC PANEL    EKG  EKG Interpretation None       Radiology Ct Renal Stone Study  Result Date: 02/09/2017 CLINICAL DATA:  Generalized abdominal pain, urinary frequency, history of bladder infections EXAM: CT ABDOMEN AND PELVIS WITHOUT CONTRAST TECHNIQUE: Multidetector CT imaging of the abdomen and pelvis was performed following the standard protocol without IV contrast. COMPARISON:  11/15/2011 FINDINGS: Lower chest: Minor basilar atelectasis. No lower lobe pneumonia. Normal heart size. No pericardial or pleural effusion. Hepatobiliary: No focal liver abnormality is seen. Status post cholecystectomy. No biliary dilatation. Pancreas: Unremarkable. No pancreatic ductal dilatation or surrounding inflammatory changes. Spleen: Normal in size without focal abnormality. Adrenals/Urinary Tract: Normal adrenal glands. Punctate nonobstructing left intrarenal calculi. No definite hydro nephrosis, perinephric or periureteral inflammatory process. No hydroureter. Negative for significant  obstructing ureteral calculus. Right kidney has an extrarenal pelvis. Stable left midpole renal cyst anteriorly measures 2.7 cm, image 30 series 2. Right kidney demonstrates a small exophytic hyperdense lesion, in the midpole posteriorly image 36, which may represent a hyperdense cyst with hemorrhage or debris. This remains indeterminate by noncontrast imaging. Stomach/Bowel: Negative for bowel obstruction, significant dilatation, ileus, or free air. Appendix not visualized. No fluid collection, or abscess. No ascites. Sigmoid diverticular disease evident. Focal mild edema/inflammation about a sigmoid diverticulum, image 67 series 2. This is confirmed on the coronal and sagittal reconstructions. Appearance compatible with mild acute focal diverticulitis. No evidence of perforation. Vascular/Lymphatic: Aortic atherosclerosis noted. Negative for aneurysm. Benign-appearing pelvic vascular calcifications. No adenopathy. Reproductive: Status post hysterectomy. No adnexal masses. Other: No abdominal wall hernia or abnormality. No abdominopelvic ascites. Musculoskeletal: Degenerative changes noted. No acute osseous finding. IMPRESSION: Findings compatible with mild focal acute sigmoid diverticulitis in the left hemipelvis. Negative for obstruction, perforation, or abscess. Remote cholecystectomy, appendectomy and hysterectomy Nonobstructing intrarenal nephrolithiasis No acute obstructing urinary tract or ureteral calculus Stable probable left  renal cyst Indeterminate 11 mm hyperdense right renal lesion. Consider further evaluation with nonemergent renal ultrasound initially. Aortic atherosclerosis without aneurysm Electronically Signed   By: Jerilynn Mages.  Shick M.D.   On: 02/09/2017 14:26    Procedures Procedures (including critical care time)  Medications Ordered in ED Medications  ciprofloxacin (CIPRO) IVPB 400 mg (not administered)  metroNIDAZOLE (FLAGYL) IVPB 500 mg (not administered)     Initial Impression /  Assessment and Plan / ED Course  I have reviewed the triage vital signs and the nursing notes.  Pertinent labs & imaging results that were available during my care of the patient were reviewed by me and considered in my medical decision making (see chart for details).     CT scan shows diverticulitis. But no perforation. Patient will be sent home with pain medicine nausea medicine Cipro and Flagyl Final Clinical Impressions(s) / ED Diagnoses   Final diagnoses:  Pain    New Prescriptions New Prescriptions   No medications on file     [place] Ferguson, MD 02/09/17 [phone]

## 2017-02-09 NOTE — Discharge Instructions (Signed)
Follow-up with her doctor this week for recheck drink plenty of fluids. Return if worsening pain or developing high fever

## 2017-02-09 NOTE — ED Triage Notes (Signed)
Several day hx of bladder infection type sx with flank pain  Dr Willey Blade is PCP

## 2017-03-19 ENCOUNTER — Other Ambulatory Visit (HOSPITAL_COMMUNITY): Payer: Self-pay | Admitting: Internal Medicine

## 2017-03-19 DIAGNOSIS — N281 Cyst of kidney, acquired: Secondary | ICD-10-CM

## 2017-03-26 ENCOUNTER — Ambulatory Visit (HOSPITAL_COMMUNITY)
Admission: RE | Admit: 2017-03-26 | Discharge: 2017-03-26 | Disposition: A | Discharge status: Home / Self Care | Payer: 59 | Source: Ambulatory Visit | Attending: Internal Medicine | Admitting: Internal Medicine

## 2017-03-26 DIAGNOSIS — N281 Cyst of kidney, acquired: Secondary | ICD-10-CM | POA: Insufficient documentation

## 2017-03-29 ENCOUNTER — Other Ambulatory Visit (HOSPITAL_COMMUNITY): Payer: Self-pay | Admitting: Internal Medicine

## 2017-03-29 DIAGNOSIS — R93421 Abnormal radiologic findings on diagnostic imaging of right kidney: Secondary | ICD-10-CM

## 2017-04-02 ENCOUNTER — Ambulatory Visit (HOSPITAL_COMMUNITY)
Admission: RE | Admit: 2017-04-02 | Discharge: 2017-04-02 | Disposition: A | Discharge status: Home / Self Care | Payer: 59 | Source: Ambulatory Visit | Attending: Internal Medicine | Admitting: Internal Medicine

## 2017-04-02 DIAGNOSIS — R93421 Abnormal radiologic findings on diagnostic imaging of right kidney: Secondary | ICD-10-CM | POA: Diagnosis present

## 2017-04-02 DIAGNOSIS — Q453 Other congenital malformations of pancreas and pancreatic duct: Secondary | ICD-10-CM | POA: Insufficient documentation

## 2017-04-02 DIAGNOSIS — N281 Cyst of kidney, acquired: Secondary | ICD-10-CM | POA: Diagnosis not present

## 2017-04-02 DIAGNOSIS — R938 Abnormal findings on diagnostic imaging of other specified body structures: Secondary | ICD-10-CM | POA: Diagnosis present

## 2017-04-02 DIAGNOSIS — K760 Fatty (change of) liver, not elsewhere classified: Secondary | ICD-10-CM | POA: Insufficient documentation

## 2017-04-02 MED ORDER — SODIUM CHLORIDE 0.9 % IV SOLN
INTRAVENOUS | Status: AC
  Filled 2017-04-02: qty 150

## 2017-04-02 MED ORDER — GADOBENATE DIMEGLUMINE 529 MG/ML IV SOLN
19.0000 mL | Freq: Once | INTRAVENOUS | Status: AC | PRN
  Administered 2017-04-02: 19 mL via INTRAVENOUS

## 2017-04-11 ENCOUNTER — Other Ambulatory Visit: Payer: Self-pay | Admitting: Urology

## 2017-05-08 NOTE — Patient Instructions (Addendum)
Rebecca Scott  05/08/2017   Your procedure is scheduled on: 05-17-17  Report to [facility] Main  Entrance Take [place]  elevators to 3rd floor to  [place] at 9:45AM.   Call this number if you have problems the morning of surgery [phone]   Remember: ONLY 1 PERSON MAY GO WITH YOU TO SHORT STAY TO GET  READY MORNING OF [place].  Do not eat food or drink liquids :After Midnight.     Take these medicines the morning of surgery with A SIP OF WATER: clonazepam(klonopin) if needed, rosuvastatin(crestor)                                 You may not have any metal on your body including hair pins and              piercings  Do not wear jewelry, make-up, lotions, powders or perfumes, deodorant             Do not wear nail polish.  Do not shave  48 hours prior to surgery.           Do not bring valuables to the hospital. [place].  Contacts, dentures or bridgework may not be worn into surgery.  Leave suitcase in the car. After surgery it may be brought to your room.                Please read over the following fact sheets you were given: _____________________________________________________________________            How to Manage Your Diabetes Before and After Surgery  Why is it important to control my blood sugar before and after surgery? . Improving blood sugar levels before and after surgery helps healing and can limit problems. . A way of improving blood sugar control is eating a healthy diet by: o  Eating less sugar and carbohydrates o  Increasing activity/exercise o  Talking with your doctor about reaching your blood sugar goals . High blood sugars (greater than 180 mg/dL) can raise your risk of infections and slow your recovery, so you will need to focus on controlling your diabetes during the weeks before surgery. . Make sure that the doctor who takes care of your diabetes knows about  your planned surgery including the date and location.  How to Manage Your Diabetes Before and After Surgery  Why is it important to control my blood sugar before and after surgery? . Improving blood sugar levels before and after surgery helps healing and can limit problems. . A way of improving blood sugar control is eating a healthy diet by: o  Eating less sugar and carbohydrates o  Increasing activity/exercise o  Talking with your doctor about reaching your blood sugar goals . High blood sugars (greater than 180 mg/dL) can raise your risk of infections and slow your recovery, so you will need to focus on controlling your diabetes during the weeks before surgery. . Make sure that the doctor who takes care of your diabetes knows about your planned surgery including the date and location.  How do I manage my blood sugar before surgery? . Check your blood sugar at least 4 times a day, starting 2 days before  surgery, to make sure that the level is not too high or low. o Check your blood sugar the morning of your surgery when you wake up and every 2 hours until you get to the Short Stay unit. . If your blood sugar is less than 70 mg/dL, you will need to treat for low blood sugar: o Do not take insulin. o Treat a low blood sugar (less than 70 mg/dL) with  cup of clear juice (cranberry or apple), 4 glucose tablets, OR glucose gel. o Recheck blood sugar in 15 minutes after treatment (to make sure it is greater than 70 mg/dL). If your blood sugar is not greater than 70 mg/dL on recheck, call [phone] for further instructions. . Report your blood sugar to the short stay nurse when you get to Short Stay.  . If you are admitted to the hospital after surgery: o Your blood sugar will be checked by the staff and you will probably be given insulin after surgery (instead of oral diabetes medicines) to make sure you have good blood sugar levels. o The goal for blood sugar control after surgery is 80-180  mg/dL.   Patient Signature:  Date:   Nurse Signature:  Date:   Reviewed and Endorsed by [facility] Patient Education Committee, August 2015  [facility] - Preparing for Surgery Before surgery, you can play an important role.  Because skin is not sterile, your skin needs to be as free of germs as possible.  You can reduce the number of germs on your skin by washing with CHG (chlorahexidine gluconate) soap before surgery.  CHG is an antiseptic cleaner which kills germs and bonds with the skin to continue killing germs even after washing. Please DO NOT use if you have an allergy to CHG or antibacterial soaps.  If your skin becomes reddened/irritated stop using the CHG and inform your nurse when you arrive at Short Stay. Do not shave (including legs and underarms) for at least 48 hours prior to the first CHG shower.  You may shave your face/neck. Please follow these instructions carefully:  1.  Shower with CHG Soap the night before surgery and the  morning of Surgery.  2.  If you choose to wash your hair, wash your hair first as usual with your  normal  shampoo.  3.  After you shampoo, rinse your hair and body thoroughly to remove the  shampoo.                           4.  Use CHG as you would any other liquid soap.  You can apply chg directly  to the skin and wash                       Gently with a scrungie or clean washcloth.  5.  Apply the CHG Soap to your body ONLY FROM THE NECK DOWN.   Do not use on face/ open                           Wound or open sores. Avoid contact with eyes, ears mouth and genitals (private parts).                       Wash face,  Genitals (private parts) with your normal soap.             6.  Wash thoroughly, paying special attention to the area where your surgery  will be performed.  7.  Thoroughly rinse your body with warm water from the neck down.  8.  DO NOT shower/wash with your normal soap after using and rinsing off  the CHG Soap.                9.  Pat  yourself dry with a clean towel.            10.  Wear clean pajamas.            11.  Place clean sheets on your bed the night of your first shower and do not  sleep with pets. Day of Surgery : Do not apply any lotions/deodorants the morning of surgery.  Please wear clean clothes to the hospital/surgery center.  FAILURE TO FOLLOW THESE INSTRUCTIONS MAY RESULT IN THE CANCELLATION OF YOUR SURGERY PATIENT SIGNATURE_________________________________  NURSE SIGNATURE__________________________________  ________________________________________________________________________

## 2017-05-08 NOTE — Progress Notes (Addendum)
LOV D Fagan III 03-14-17 on chart   Urinalysis Dr Willey Blade 04-30-17 on chart   ekg 09-11-16  FROM DR Asencion Noble III OFFICE ON CHART   CXR  12-17-16 EPIC

## 2017-05-09 ENCOUNTER — Encounter (HOSPITAL_COMMUNITY)
Admission: RE | Admit: 2017-05-09 | Discharge: 2017-05-09 | Disposition: A | Discharge status: Home / Self Care | Payer: 59 | Source: Ambulatory Visit | Attending: Urology | Admitting: Urology

## 2017-05-09 ENCOUNTER — Encounter (HOSPITAL_COMMUNITY): Payer: Self-pay

## 2017-05-09 DIAGNOSIS — Z01812 Encounter for preprocedural laboratory examination: Secondary | ICD-10-CM | POA: Insufficient documentation

## 2017-05-09 DIAGNOSIS — N289 Disorder of kidney and ureter, unspecified: Secondary | ICD-10-CM | POA: Diagnosis not present

## 2017-05-09 HISTORY — DX: Chronic kidney disease, unspecified: N18.9

## 2017-05-09 HISTORY — DX: Gastro-esophageal reflux disease without esophagitis: K21.9

## 2017-05-09 HISTORY — DX: Other specified postprocedural states: Z98.890

## 2017-05-09 HISTORY — DX: Anemia, unspecified: D64.9

## 2017-05-09 HISTORY — DX: Unspecified hemorrhoids: K64.9

## 2017-05-09 HISTORY — DX: Migraine, unspecified, not intractable, without status migrainosus: G43.909

## 2017-05-09 HISTORY — DX: Nausea with vomiting, unspecified: R11.2

## 2017-05-09 HISTORY — DX: Prediabetes: R73.03

## 2017-05-09 LAB — CBC
HCT: 34.4 % — ABNORMAL LOW (ref 36.0–46.0)
Hemoglobin: 11 g/dL — ABNORMAL LOW (ref 12.0–15.0)
MCH: 27.2 pg (ref 26.0–34.0)
MCHC: 32 g/dL (ref 30.0–36.0)
MCV: 85.1 fL (ref 78.0–100.0)
PLATELETS: 151 10*3/uL (ref 150–400)
RBC: 4.04 MIL/uL (ref 3.87–5.11)
RDW: 16.4 % — AB (ref 11.5–15.5)
WBC: 5.9 10*3/uL (ref 4.0–10.5)

## 2017-05-09 LAB — BASIC METABOLIC PANEL
Anion gap: 7 (ref 5–15)
BUN: 9 mg/dL (ref 6–20)
CALCIUM: 9.5 mg/dL (ref 8.9–10.3)
CHLORIDE: 106 mmol/L (ref 101–111)
CO2: 27 mmol/L (ref 22–32)
CREATININE: 0.61 mg/dL (ref 0.44–1.00)
GFR calc Af Amer: >60 mL/min (ref >60)
Glucose, Bld: 97 mg/dL (ref 65–99)
Potassium: 3.3 mmol/L — ABNORMAL LOW (ref 3.5–5.1)
SODIUM: 140 mmol/L (ref 135–145)

## 2017-05-09 LAB — HEMOGLOBIN A1C
Hgb A1c MFr Bld: 5.7 % — ABNORMAL HIGH (ref 4.8–5.6)
Mean Plasma Glucose: 116.89 mg/dL

## 2017-05-09 NOTE — Progress Notes (Signed)
No bowel prep indicated in epic . Patient brought bowel prep instructions from [facility] to PAT appt; presented to nurse at end of appt after PAT instructions were printed and reviewed. RN spent extra time reviewing bowel prep instructions  with patient as she had questions . Patient thanked Therapist, sports for reviewing.

## 2017-05-11 DIAGNOSIS — C649 Malignant neoplasm of unspecified kidney, except renal pelvis: Secondary | ICD-10-CM

## 2017-05-11 HISTORY — DX: Malignant neoplasm of unspecified kidney, except renal pelvis: C64.9

## 2017-05-17 ENCOUNTER — Encounter (HOSPITAL_COMMUNITY)
Admission: RE | Disposition: A | Discharge status: Home / Self Care | Payer: Self-pay | Source: Ambulatory Visit | Attending: Urology

## 2017-05-17 ENCOUNTER — Encounter (HOSPITAL_COMMUNITY): Payer: Self-pay | Admitting: Emergency Medicine

## 2017-05-17 ENCOUNTER — Inpatient Hospital Stay (HOSPITAL_COMMUNITY): Payer: 59 | Admitting: Registered Nurse

## 2017-05-17 ENCOUNTER — Inpatient Hospital Stay (HOSPITAL_COMMUNITY)
Admission: RE | Admit: 2017-05-17 | Discharge: 2017-05-20 | DRG: 658 | Disposition: A | Discharge status: Home / Self Care | Payer: 59 | Source: Ambulatory Visit | Attending: Urology | Admitting: Urology

## 2017-05-17 DIAGNOSIS — Z9049 Acquired absence of other specified parts of digestive tract: Secondary | ICD-10-CM | POA: Diagnosis not present

## 2017-05-17 DIAGNOSIS — Z7982 Long term (current) use of aspirin: Secondary | ICD-10-CM

## 2017-05-17 DIAGNOSIS — E785 Hyperlipidemia, unspecified: Secondary | ICD-10-CM | POA: Diagnosis present

## 2017-05-17 DIAGNOSIS — Z881 Allergy status to other antibiotic agents status: Secondary | ICD-10-CM

## 2017-05-17 DIAGNOSIS — G4730 Sleep apnea, unspecified: Secondary | ICD-10-CM | POA: Diagnosis present

## 2017-05-17 DIAGNOSIS — Z90710 Acquired absence of both cervix and uterus: Secondary | ICD-10-CM | POA: Diagnosis not present

## 2017-05-17 DIAGNOSIS — Z888 Allergy status to other drugs, medicaments and biological substances status: Secondary | ICD-10-CM | POA: Diagnosis not present

## 2017-05-17 DIAGNOSIS — G43909 Migraine, unspecified, not intractable, without status migrainosus: Secondary | ICD-10-CM | POA: Diagnosis present

## 2017-05-17 DIAGNOSIS — Z882 Allergy status to sulfonamides status: Secondary | ICD-10-CM | POA: Diagnosis not present

## 2017-05-17 DIAGNOSIS — K219 Gastro-esophageal reflux disease without esophagitis: Secondary | ICD-10-CM | POA: Diagnosis present

## 2017-05-17 DIAGNOSIS — K660 Peritoneal adhesions (postprocedural) (postinfection): Secondary | ICD-10-CM | POA: Diagnosis present

## 2017-05-17 DIAGNOSIS — N2889 Other specified disorders of kidney and ureter: Secondary | ICD-10-CM | POA: Diagnosis present

## 2017-05-17 DIAGNOSIS — E669 Obesity, unspecified: Secondary | ICD-10-CM | POA: Diagnosis present

## 2017-05-17 DIAGNOSIS — Z6838 Body mass index (BMI) 38.0-38.9, adult: Secondary | ICD-10-CM

## 2017-05-17 DIAGNOSIS — I129 Hypertensive chronic kidney disease with stage 1 through stage 4 chronic kidney disease, or unspecified chronic kidney disease: Secondary | ICD-10-CM | POA: Diagnosis present

## 2017-05-17 DIAGNOSIS — C641 Malignant neoplasm of right kidney, except renal pelvis: Principal | ICD-10-CM | POA: Diagnosis present

## 2017-05-17 DIAGNOSIS — N189 Chronic kidney disease, unspecified: Secondary | ICD-10-CM | POA: Diagnosis present

## 2017-05-17 DIAGNOSIS — Z91018 Allergy to other foods: Secondary | ICD-10-CM | POA: Diagnosis not present

## 2017-05-17 DIAGNOSIS — Z91041 Radiographic dye allergy status: Secondary | ICD-10-CM

## 2017-05-17 HISTORY — PX: ROBOTIC ASSITED PARTIAL NEPHRECTOMY: SHX6087

## 2017-05-17 LAB — GLUCOSE, CAPILLARY: Glucose-Capillary: 124 mg/dL — ABNORMAL HIGH (ref 65–99)

## 2017-05-17 LAB — TYPE AND SCREEN
ABO/RH(D): O POS
Antibody Screen: NEGATIVE

## 2017-05-17 LAB — HEMOGLOBIN AND HEMATOCRIT, BLOOD
HEMATOCRIT: 33.2 % — AB (ref 36.0–46.0)
HEMOGLOBIN: 10.7 g/dL — AB (ref 12.0–15.0)

## 2017-05-17 SURGERY — NEPHRECTOMY, PARTIAL, ROBOT-ASSISTED
Anesthesia: General | Laterality: Right

## 2017-05-17 MED ORDER — HYDROMORPHONE HCL-NACL 0.5-0.9 MG/ML-% IV SOSY
PREFILLED_SYRINGE | INTRAVENOUS | Status: AC
  Filled 2017-05-17: qty 4

## 2017-05-17 MED ORDER — PROMETHAZINE HCL 25 MG/ML IJ SOLN
6.2500 mg | INTRAMUSCULAR | Status: DC | PRN
  Administered 2017-05-17: 6.25 mg via INTRAVENOUS

## 2017-05-17 MED ORDER — FENTANYL CITRATE (PF) 250 MCG/5ML IJ SOLN
INTRAMUSCULAR | Status: AC
  Filled 2017-05-17: qty 5

## 2017-05-17 MED ORDER — ONDANSETRON HCL 4 MG/2ML IJ SOLN
INTRAMUSCULAR | Status: DC | PRN
  Administered 2017-05-17: 4 mg via INTRAVENOUS

## 2017-05-17 MED ORDER — OXYCODONE HCL 5 MG PO TABS
5.0000 mg | ORAL_TABLET | ORAL | Status: DC | PRN
  Administered 2017-05-17 – 2017-05-20 (×6): 5 mg via ORAL
  Filled 2017-05-17 (×6): qty 1

## 2017-05-17 MED ORDER — MIDAZOLAM HCL 2 MG/2ML IJ SOLN
INTRAMUSCULAR | Status: AC
  Filled 2017-05-17: qty 2

## 2017-05-17 MED ORDER — SODIUM CHLORIDE 0.9 % IJ SOLN
INTRAMUSCULAR | Status: AC
  Filled 2017-05-17: qty 10

## 2017-05-17 MED ORDER — HYDROCODONE-ACETAMINOPHEN 5-325 MG PO TABS: 1.0000 | ORAL_TABLET | Freq: Four times a day (QID) | ORAL | 0 refills | Status: DC | PRN

## 2017-05-17 MED ORDER — LACTATED RINGERS IV SOLN
INTRAVENOUS | Status: DC
  Administered 2017-05-17 – 2017-05-19 (×4): via INTRAVENOUS

## 2017-05-17 MED ORDER — FENTANYL CITRATE (PF) 250 MCG/5ML IJ SOLN
INTRAMUSCULAR | Status: DC | PRN
  Administered 2017-05-17: 100 ug via INTRAVENOUS
  Administered 2017-05-17 (×3): 50 ug via INTRAVENOUS

## 2017-05-17 MED ORDER — LIDOCAINE 2% (20 MG/ML) 5 ML SYRINGE
INTRAMUSCULAR | Status: DC | PRN
  Administered 2017-05-17: 60 mg via INTRAVENOUS

## 2017-05-17 MED ORDER — LIDOCAINE 2% (20 MG/ML) 5 ML SYRINGE
INTRAMUSCULAR | Status: AC
  Filled 2017-05-17: qty 5

## 2017-05-17 MED ORDER — ROCURONIUM BROMIDE 50 MG/5ML IV SOSY
PREFILLED_SYRINGE | INTRAVENOUS | Status: AC
  Filled 2017-05-17: qty 10

## 2017-05-17 MED ORDER — SUGAMMADEX SODIUM 200 MG/2ML IV SOLN
INTRAVENOUS | Status: DC | PRN
  Administered 2017-05-17: 200 mg via INTRAVENOUS

## 2017-05-17 MED ORDER — PROPOFOL 10 MG/ML IV BOLUS
INTRAVENOUS | Status: DC | PRN
  Administered 2017-05-17: 200 mg via INTRAVENOUS

## 2017-05-17 MED ORDER — ACETAMINOPHEN 500 MG PO TABS
1000.0000 mg | ORAL_TABLET | Freq: Four times a day (QID) | ORAL | Status: AC
  Administered 2017-05-17 – 2017-05-18 (×3): 1000 mg via ORAL
  Filled 2017-05-17 (×4): qty 2

## 2017-05-17 MED ORDER — FENTANYL CITRATE (PF) 100 MCG/2ML IJ SOLN
INTRAMUSCULAR | Status: AC
  Filled 2017-05-17: qty 4

## 2017-05-17 MED ORDER — PANTOPRAZOLE SODIUM 40 MG PO TBEC
80.0000 mg | DELAYED_RELEASE_TABLET | Freq: Every day | ORAL | Status: DC
  Administered 2017-05-18 – 2017-05-19 (×2): 80 mg via ORAL
  Filled 2017-05-17 (×2): qty 2

## 2017-05-17 MED ORDER — HYDROMORPHONE HCL-NACL 0.5-0.9 MG/ML-% IV SOSY
0.2500 mg | PREFILLED_SYRINGE | INTRAVENOUS | Status: DC | PRN
  Administered 2017-05-17 (×4): 0.5 mg via INTRAVENOUS

## 2017-05-17 MED ORDER — STERILE WATER FOR IRRIGATION IR SOLN
Status: DC | PRN
  Administered 2017-05-17: 1000 mL

## 2017-05-17 MED ORDER — ONDANSETRON HCL 4 MG/2ML IJ SOLN
4.0000 mg | INTRAMUSCULAR | Status: DC | PRN
  Administered 2017-05-18 – 2017-05-20 (×5): 4 mg via INTRAVENOUS
  Filled 2017-05-17 (×5): qty 2

## 2017-05-17 MED ORDER — PROMETHAZINE HCL 25 MG/ML IJ SOLN
INTRAMUSCULAR | Status: AC
  Filled 2017-05-17: qty 1

## 2017-05-17 MED ORDER — HYDROMORPHONE HCL-NACL 0.5-0.9 MG/ML-% IV SOSY
0.5000 mg | PREFILLED_SYRINGE | INTRAVENOUS | Status: DC | PRN
  Administered 2017-05-17 – 2017-05-18 (×3): 0.5 mg via INTRAVENOUS
  Filled 2017-05-17 (×3): qty 1

## 2017-05-17 MED ORDER — DIPHENHYDRAMINE HCL 50 MG/ML IJ SOLN: 12.5000 mg | Freq: Four times a day (QID) | INTRAMUSCULAR | Status: DC | PRN

## 2017-05-17 MED ORDER — DEXAMETHASONE SODIUM PHOSPHATE 10 MG/ML IJ SOLN
INTRAMUSCULAR | Status: AC
  Filled 2017-05-17: qty 1

## 2017-05-17 MED ORDER — SCOPOLAMINE 1 MG/3DAYS TD PT72
MEDICATED_PATCH | TRANSDERMAL | Status: DC | PRN
  Administered 2017-05-17: 1 via TRANSDERMAL

## 2017-05-17 MED ORDER — HEMOSTATIC AGENTS (NO CHARGE) OPTIME
TOPICAL | Status: DC | PRN
  Administered 2017-05-17: 1 via TOPICAL

## 2017-05-17 MED ORDER — DEXAMETHASONE SODIUM PHOSPHATE 10 MG/ML IJ SOLN
INTRAMUSCULAR | Status: DC | PRN
  Administered 2017-05-17: 10 mg via INTRAVENOUS

## 2017-05-17 MED ORDER — PROPOFOL 10 MG/ML IV BOLUS
INTRAVENOUS | Status: AC
  Filled 2017-05-17: qty 20

## 2017-05-17 MED ORDER — ROCURONIUM BROMIDE 10 MG/ML (PF) SYRINGE
PREFILLED_SYRINGE | INTRAVENOUS | Status: DC | PRN
  Administered 2017-05-17: 20 mg via INTRAVENOUS
  Administered 2017-05-17: 10 mg via INTRAVENOUS
  Administered 2017-05-17: 50 mg via INTRAVENOUS

## 2017-05-17 MED ORDER — HYDROCHLOROTHIAZIDE 25 MG PO TABS: 25.0000 mg | ORAL_TABLET | Freq: Every day | ORAL | Status: DC

## 2017-05-17 MED ORDER — LACTATED RINGERS IR SOLN
Status: DC | PRN
  Administered 2017-05-17: 1

## 2017-05-17 MED ORDER — CLONAZEPAM 0.5 MG PO TABS: 0.5000 mg | ORAL_TABLET | Freq: Two times a day (BID) | ORAL | Status: DC | PRN

## 2017-05-17 MED ORDER — SCOPOLAMINE 1 MG/3DAYS TD PT72
MEDICATED_PATCH | TRANSDERMAL | Status: AC
  Filled 2017-05-17: qty 1

## 2017-05-17 MED ORDER — LOSARTAN POTASSIUM 50 MG PO TABS
100.0000 mg | ORAL_TABLET | Freq: Every day | ORAL | Status: DC
  Administered 2017-05-19 – 2017-05-20 (×2): 100 mg via ORAL
  Filled 2017-05-17 (×3): qty 2

## 2017-05-17 MED ORDER — LOSARTAN POTASSIUM-HCTZ 100-25 MG PO TABS: 1.0000 | ORAL_TABLET | Freq: Every day | ORAL | Status: DC

## 2017-05-17 MED ORDER — ROSUVASTATIN CALCIUM 5 MG PO TABS
5.0000 mg | ORAL_TABLET | Freq: Every day | ORAL | Status: DC
  Administered 2017-05-18 – 2017-05-20 (×3): 5 mg via ORAL
  Filled 2017-05-17 (×3): qty 1

## 2017-05-17 MED ORDER — CEFAZOLIN SODIUM-DEXTROSE 2-4 GM/100ML-% IV SOLN
2.0000 g | INTRAVENOUS | Status: AC
  Administered 2017-05-17: 2 g via INTRAVENOUS
  Filled 2017-05-17: qty 100

## 2017-05-17 MED ORDER — DIPHENHYDRAMINE HCL 12.5 MG/5ML PO ELIX
12.5000 mg | ORAL_SOLUTION | Freq: Four times a day (QID) | ORAL | Status: DC | PRN
Start: 2017-05-17 — End: 2017-05-20

## 2017-05-17 MED ORDER — MIDAZOLAM HCL 5 MG/5ML IJ SOLN
INTRAMUSCULAR | Status: DC | PRN
  Administered 2017-05-17 (×2): 1 mg via INTRAVENOUS

## 2017-05-17 MED ORDER — BUPIVACAINE LIPOSOME 1.3 % IJ SUSP
20.0000 mL | Freq: Once | INTRAMUSCULAR | Status: AC
  Administered 2017-05-17: 40 mL
  Filled 2017-05-17: qty 20

## 2017-05-17 MED ORDER — ONDANSETRON HCL 4 MG/2ML IJ SOLN
INTRAMUSCULAR | Status: AC
  Filled 2017-05-17: qty 2

## 2017-05-17 SURGICAL SUPPLY — 74 items
ADH SKN CLS APL DERMABOND .7 (GAUZE/BANDAGES/DRESSINGS) ×1
AGENT HMST KT MTR STRL THRMB (HEMOSTASIS)
APL ESCP 34 STRL LF DISP (HEMOSTASIS) ×1
APPLICATOR SURGIFLO ENDO (HEMOSTASIS) ×2 IMPLANT
BAG SPEC RTRVL LRG 6X4 10 (ENDOMECHANICALS) ×1
CHLORAPREP W/TINT 26ML (MISCELLANEOUS) ×2 IMPLANT
CLIP SUT LAPRA TY ABSORB (SUTURE) ×4 IMPLANT
CLIP VESOLOCK LG 6/CT PURPLE (CLIP) ×2 IMPLANT
CLIP VESOLOCK MED LG 6/CT (CLIP) ×4 IMPLANT
CLIP VESOLOCK XL 6/CT (CLIP) IMPLANT
COVER SURGICAL LIGHT HANDLE (MISCELLANEOUS) ×2 IMPLANT
COVER TIP SHEARS 8 DVNC (MISCELLANEOUS) ×1 IMPLANT
COVER TIP SHEARS 8MM DA VINCI (MISCELLANEOUS) ×1
DECANTER SPIKE VIAL GLASS SM (MISCELLANEOUS) ×2 IMPLANT
DERMABOND ADVANCED (GAUZE/BANDAGES/DRESSINGS) ×1
DERMABOND ADVANCED .7 DNX12 (GAUZE/BANDAGES/DRESSINGS) ×1 IMPLANT
DRAIN CHANNEL 15F RND FF 3/16 (WOUND CARE) ×2 IMPLANT
DRAPE ARM DVNC X/XI (DISPOSABLE) ×4 IMPLANT
DRAPE COLUMN DVNC XI (DISPOSABLE) ×1 IMPLANT
DRAPE DA VINCI XI ARM (DISPOSABLE) ×4
DRAPE DA VINCI XI COLUMN (DISPOSABLE) ×1
DRAPE INCISE IOBAN 66X45 STRL (DRAPES) ×2 IMPLANT
DRAPE LAPAROSCOPIC ABDOMINAL (DRAPES) ×2 IMPLANT
DRAPE SHEET LG 3/4 BI-LAMINATE (DRAPES) ×2 IMPLANT
ELECT PENCIL ROCKER SW 15FT (MISCELLANEOUS) ×2 IMPLANT
ELECT REM PT RETURN 15FT ADLT (MISCELLANEOUS) ×2 IMPLANT
EVACUATOR SILICONE 100CC (DRAIN) ×2 IMPLANT
GLOVE BIO SURGEON STRL SZ 6.5 (GLOVE) ×2 IMPLANT
GLOVE BIOGEL M STRL SZ7.5 (GLOVE) ×4 IMPLANT
GOWN STRL REUS W/TWL LRG LVL3 (GOWN DISPOSABLE) ×4 IMPLANT
HEMOSTAT SURGICEL 4X8 (HEMOSTASIS) ×1 IMPLANT
IRRIG SUCT STRYKERFLOW 2 WTIP (MISCELLANEOUS) ×2
IRRIGATION SUCT STRKRFLW 2 WTP (MISCELLANEOUS) IMPLANT
KIT BASIN OR (CUSTOM PROCEDURE TRAY) ×2 IMPLANT
LOOP VESSEL MAXI BLUE (MISCELLANEOUS) ×2 IMPLANT
MARKER SKIN DUAL TIP RULER LAB (MISCELLANEOUS) ×2 IMPLANT
NDL INSUFFLATION 14GA 120MM (NEEDLE) ×1 IMPLANT
NEEDLE INSUFFLATION 14GA 120MM (NEEDLE) ×2 IMPLANT
NS IRRIG 1000ML POUR BTL (IV SOLUTION) ×2 IMPLANT
PORT ACCESS TROCAR AIRSEAL 12 (TROCAR) ×1 IMPLANT
PORT ACCESS TROCAR AIRSEAL 5M (TROCAR) ×1
POSITIONER SURGICAL ARM (MISCELLANEOUS) ×4 IMPLANT
POUCH SPECIMEN RETRIEVAL 10MM (ENDOMECHANICALS) ×2 IMPLANT
RELOAD STAPLE 60 2.6 WHT THN (STAPLE) IMPLANT
RELOAD STAPLER WHITE 60MM (STAPLE) IMPLANT
SEAL CANN UNIV 5-8 DVNC XI (MISCELLANEOUS) ×4 IMPLANT
SEAL XI 5MM-8MM UNIVERSAL (MISCELLANEOUS) ×4
SET TRI-LUMEN FLTR TB AIRSEAL (TUBING) ×2 IMPLANT
SOLUTION ELECTROLUBE (MISCELLANEOUS) ×2 IMPLANT
SPONGE LAP 4X18 X RAY DECT (DISPOSABLE) ×2 IMPLANT
STAPLE ECHEON FLEX 60 POW ENDO (STAPLE) IMPLANT
STAPLER RELOAD WHITE 60MM (STAPLE)
SURGIFLO W/THROMBIN 8M KIT (HEMOSTASIS) ×1 IMPLANT
SUT ETHILON 3 0 PS 1 (SUTURE) ×2 IMPLANT
SUT MNCRL AB 4-0 PS2 18 (SUTURE) ×4 IMPLANT
SUT PDS AB 1 CT1 27 (SUTURE) ×4 IMPLANT
SUT V-LOC BARB 180 2/0GR6 GS22 (SUTURE)
SUT VIC AB 0 CT1 27 (SUTURE) ×8
SUT VIC AB 0 CT1 27XBRD ANTBC (SUTURE) ×4 IMPLANT
SUT VIC AB 2-0 SH 27 (SUTURE) ×4
SUT VIC AB 2-0 SH 27X BRD (SUTURE) ×2 IMPLANT
SUT VLOC 180 2-0 9IN GS21 (SUTURE) ×1 IMPLANT
SUT VLOC BARB 180 ABS3/0GR12 (SUTURE) ×2
SUTURE V-LC BRB 180 2/0GR6GS22 (SUTURE) IMPLANT
SUTURE VLOC BRB 180 ABS3/0GR12 (SUTURE) ×1 IMPLANT
TAPE STRIPS DRAPE STRL (GAUZE/BANDAGES/DRESSINGS) ×2 IMPLANT
TOWEL OR 17X26 10 PK STRL BLUE (TOWEL DISPOSABLE) ×4 IMPLANT
TOWEL OR NON WOVEN STRL DISP B (DISPOSABLE) ×2 IMPLANT
TRAY FOLEY CATH 14FR (SET/KITS/TRAYS/PACK) ×1 IMPLANT
TRAY FOLEY W/METER SILVER 16FR (SET/KITS/TRAYS/PACK) ×1 IMPLANT
TRAY LAPAROSCOPIC (CUSTOM PROCEDURE TRAY) ×2 IMPLANT
TROCAR BLADELESS OPT 5 100 (ENDOMECHANICALS) IMPLANT
TROCAR XCEL 12X100 BLDLESS (ENDOMECHANICALS) ×2 IMPLANT
WATER STERILE IRR 1000ML POUR (IV SOLUTION) ×4 IMPLANT

## 2017-05-17 NOTE — H&P (Signed)
Rebecca Scott is an 61 y.o. female.    Chief Complaint: Pre-op RIGHT Robotic Partial Nephrectomy  HPI:   1 - Right Renal Cancer - 3mm Rt mid lateral 90% exophytic avidly enhancing solid mass incidental on CT then dedicated MRI 2018. 1 artery / 1 vein right renovascular anatomy.    PMH sig for obesity, benign hyst, lap chole, HLD, HTN. Her PCP is Rebecca Noble MD in [place].   Today "Rebecca Scott" is seen to proceed with RIGHT robotic partial nephrectomy. No interval fevers.    Past Medical History:  Diagnosis Date  . Anemia   . Asthma    seasonal asthma rarely   . Chronic kidney disease    renal mass right kidney   . CPAP (continuous positive airway pressure) dependence   . GERD (gastroesophageal reflux disease)   . Hemorrhoids   . Hyperlipemia   . Hypertension   . Migraine   . PONV (postoperative nausea and vomiting)    patient reports being "slower to wake than average "   . Pre-diabetes    "ive been told i'm borderline"   . Sleep apnea    CPAP use     Past Surgical History:  Procedure Laterality Date  . ABDOMINAL HYSTERECTOMY    . APPENDECTOMY    . CHOLECYSTECTOMY    . NASAL SEPTUM SURGERY    . TONSILLECTOMY     per patient not surgically removed " i htink they may have rotted out "     No family history on file. Social History:  reports that she has never smoked. She has never used smokeless tobacco. She reports that she drinks alcohol. She reports that she does not use drugs.  Allergies:  Allergies  Allergen Reactions  . Iodinated Diagnostic Agents Anaphylaxis  . Isovue [Iopamidol] Itching    And hives   . Nitrofurantoin Hives  . Sulfa Antibiotics Hives  . Adhesive [Tape] Rash and Other (See Comments)    Paper tape ONLY  . Macrobid [Nitrofurantoin Monohyd Macro] Rash  . Mango Flavor Swelling and Rash    Tongue swelling, feels like throat is closing in on her     No prescriptions prior to admission.    No results found for this or any previous visit  (from the past 48 hour(s)). No results found.  Review of Systems  Constitutional: Negative.  Negative for chills and fever.  HENT: Negative.   Eyes: Negative.   Respiratory: Negative.   Cardiovascular: Negative.   Gastrointestinal: Negative for nausea and vomiting.  Genitourinary: Negative for hematuria.  Musculoskeletal: Negative.   Skin: Negative.   Neurological: Negative.   Endo/Heme/Allergies: Negative.   Psychiatric/Behavioral: Negative.     There were no vitals taken for this visit. Physical Exam  Constitutional: She appears well-developed.  HENT:  Head: Normocephalic.  Eyes: Pupils are equal, round, and reactive to light.  Neck: Normal range of motion.  Cardiovascular: Normal rate.   Respiratory: Effort normal.  GI: Soft.  Stable truncal obesity. Few scars w/o hernias.   Genitourinary:  Genitourinary Comments: No CVAT  Musculoskeletal: Normal range of motion.  Neurological: She is alert.  Skin: Skin is warm.  Psychiatric: She has a normal mood and affect.     Assessment/Plan  Proceed as planned with RIGHT robotic partial nephrectomy. Risks, benefits, alternatives, expected peri-op course discussed previously and reiterated today.   Alexis Frock, MD 05/17/2017, 6:24 AM

## 2017-05-17 NOTE — Anesthesia Procedure Notes (Signed)
Procedure Name: Intubation Date/Time: 05/17/2017 12:42 PM Performed by: Talbot Grumbling Pre-anesthesia Checklist: Patient identified, Emergency Drugs available, Suction available, Patient being monitored and Timeout performed Patient Re-evaluated:Patient Re-evaluated prior to induction Oxygen Delivery Method: Circle system utilized Preoxygenation: Pre-oxygenation with 100% oxygen Induction Type: IV induction Ventilation: Oral airway inserted - appropriate to patient size Laryngoscope Size: Mac and 3 Grade View: Grade III Tube type: Oral Tube size: 7.5 mm Number of attempts: 1 Airway Equipment and Method: Rigid stylet Placement Confirmation: ETT inserted through vocal cords under direct vision,  positive ETCO2,  CO2 detector and breath sounds checked- equal and bilateral Secured at: 22 cm Tube secured with: Tape Dental Injury: Teeth and Oropharynx as per pre-operative assessment

## 2017-05-17 NOTE — Transfer of Care (Signed)
Immediate Anesthesia Transfer of Care Note  Patient: Rebecca Scott  Procedure(s) Performed: Procedure(s): XI ROBOTIC ASSITED PARTIAL NEPHRECTOMY (Right)  Patient Location: PACU  Anesthesia Type:general  Level of Consciousness:  sedated, patient cooperative and responds to stimulation  Airway & Oxygen Therapy:Patient Spontanous Breathing and Patient connected to face mask oxgen  Post-op Assessment:  Report given to PACU RN and Post -op Vital signs reviewed and stable  Post vital signs:  Reviewed and stable  Last Vitals:  Vitals:   05/17/17 1048  BP: 123/66  Pulse: (!) 57  Resp: 18  Temp: 36.9 C  SpO2: 938%    Complications: No apparent anesthesia complications

## 2017-05-17 NOTE — Discharge Instructions (Signed)

## 2017-05-17 NOTE — Brief Op Note (Signed)
05/17/2017  4:23 PM  PATIENT:  Rebecca Scott  61 y.o. female  PRE-OPERATIVE DIAGNOSIS:  RIGHT RENAL MASS  POST-OPERATIVE DIAGNOSIS:  RIGHT RENAL MASS  PROCEDURE:  Procedure(s): XI ROBOTIC ASSITED PARTIAL NEPHRECTOMY (Right)  SURGEON:  Surgeon(s) and Role:    Alexis Frock, MD - Primary  PHYSICIAN ASSISTANT:   ASSISTANTS: Clemetine Marker PA   ANESTHESIA:   general  EBL:  Total I/O In: 1000 [I.V.:1000] Out: 170 [Urine:120; Blood:50]  BLOOD ADMINISTERED:none  DRAINS: 1 - JP to bulb, 2- Foley to gravity   LOCAL MEDICATIONS USED:  MARCAINE     SPECIMEN:  Source of Specimen:  Rt renal mass  DISPOSITION OF SPECIMEN:  PATHOLOGY  COUNTS:  YES  TOURNIQUET:  * No tourniquets in log *  DICTATION: .Other Dictation: Dictation Number [id number]  PLAN OF CARE: Admit to inpatient   PATIENT DISPOSITION:  PACU - hemodynamically stable.   Delay start of Pharmacological VTE agent (>24hrs) due to surgical blood loss or risk of bleeding: yes

## 2017-05-17 NOTE — Interval H&P Note (Signed)
History and Physical Interval Note:  05/17/2017 12:01 PM  Rebecca Scott  has presented today for surgery, with the diagnosis of RIGHT RENAL MASS  The various methods of treatment have been discussed with the patient and family. After consideration of risks, benefits and other options for treatment, the patient has consented to  Procedure(s): XI ROBOTIC ASSITED PARTIAL NEPHRECTOMY (Right) as a surgical intervention .  The patient's history has been reviewed, patient examined, no change in status, stable for surgery.  I have reviewed the patient's chart and labs.  Questions were answered to the patient's satisfaction.     Schawn Byas

## 2017-05-17 NOTE — Anesthesia Preprocedure Evaluation (Signed)
Anesthesia Evaluation  Patient identified by MRN, date of birth, ID band Patient awake    Reviewed: Allergy & Precautions, NPO status , Patient's Chart, lab work & pertinent test results  History of Anesthesia Complications (+) PONV  Airway Mallampati: II  TM Distance: >3 FB Neck ROM: Full    Dental no notable dental hx.    Pulmonary sleep apnea and Continuous Positive Airway Pressure Ventilation ,    Pulmonary exam normal breath sounds clear to auscultation       Cardiovascular hypertension, Normal cardiovascular exam Rhythm:Regular Rate:Normal     Neuro/Psych negative neurological ROS  negative psych ROS   GI/Hepatic negative GI ROS, Neg liver ROS,   Endo/Other  negative endocrine ROS  Renal/GU negative Renal ROS  negative genitourinary   Musculoskeletal negative musculoskeletal ROS (+)   Abdominal   Peds negative pediatric ROS (+)  Hematology negative hematology ROS (+)   Anesthesia Other Findings   Reproductive/Obstetrics negative OB ROS                             Anesthesia Physical Anesthesia Plan  ASA: III  Anesthesia Plan: General   Post-op Pain Management:    Induction: Intravenous  PONV Risk Score and Plan: 2 and Ondansetron and Dexamethasone  Airway Management Planned: Oral ETT  Additional Equipment:   Intra-op Plan:   Post-operative Plan: Extubation in OR  Informed Consent: I have reviewed the patients History and Physical, chart, labs and discussed the procedure including the risks, benefits and alternatives for the proposed anesthesia with the patient or authorized representative who has indicated his/her understanding and acceptance.   Dental advisory given  Plan Discussed with: CRNA and Surgeon  Anesthesia Plan Comments:         Anesthesia Quick Evaluation

## 2017-05-17 NOTE — Anesthesia Postprocedure Evaluation (Signed)
Anesthesia Post Note  Patient: Rebecca Scott  Procedure(s) Performed: Procedure(s) (LRB): XI ROBOTIC ASSITED PARTIAL NEPHRECTOMY (Right)     Patient location during evaluation: PACU Anesthesia Type: General Level of consciousness: awake and alert Pain management: pain level controlled Vital Signs Assessment: post-procedure vital signs reviewed and stable Respiratory status: spontaneous breathing, nonlabored ventilation, respiratory function stable and patient connected to nasal cannula oxygen Cardiovascular status: blood pressure returned to baseline and stable Postop Assessment: no signs of nausea or vomiting Anesthetic complications: no    Last Vitals:  Vitals:   05/17/17 1545 05/17/17 1600  BP: (!) 141/81 137/70  Pulse: 66 64  Resp: 15 18  Temp:    SpO2: 100% 100%    Last Pain:  Vitals:   05/17/17 1600  TempSrc:   PainSc: Asleep                 Deaisa Merida S

## 2017-05-18 ENCOUNTER — Encounter (HOSPITAL_COMMUNITY): Payer: Self-pay | Admitting: Urology

## 2017-05-18 LAB — BASIC METABOLIC PANEL
Anion gap: 8 (ref 5–15)
BUN: 12 mg/dL (ref 6–20)
CALCIUM: 8.5 mg/dL — AB (ref 8.9–10.3)
CO2: 26 mmol/L (ref 22–32)
CREATININE: 0.78 mg/dL (ref 0.44–1.00)
Chloride: 104 mmol/L (ref 101–111)
GLUCOSE: 132 mg/dL — AB (ref 65–99)
Potassium: 3.8 mmol/L (ref 3.5–5.1)
Sodium: 138 mmol/L (ref 135–145)

## 2017-05-18 LAB — HEMOGLOBIN AND HEMATOCRIT, BLOOD
HCT: 30.4 % — ABNORMAL LOW (ref 36.0–46.0)
Hemoglobin: 9.8 g/dL — ABNORMAL LOW (ref 12.0–15.0)

## 2017-05-18 MED ORDER — ACETAMINOPHEN 10 MG/ML IV SOLN
1000.0000 mg | Freq: Four times a day (QID) | INTRAVENOUS | Status: AC
  Administered 2017-05-18 – 2017-05-19 (×4): 1000 mg via INTRAVENOUS
  Filled 2017-05-18 (×4): qty 100

## 2017-05-18 MED ORDER — SODIUM CHLORIDE 0.9 % IV BOLUS (SEPSIS)
500.0000 mL | Freq: Once | INTRAVENOUS | Status: AC
  Administered 2017-05-18: 500 mL via INTRAVENOUS

## 2017-05-18 NOTE — Progress Notes (Signed)
1 Day Post-Op Subjective: Patient reports worry about bp/low pulse. Pain 7/10--better w/ narcotics. No N/V.  Objective: Vital signs in last 24 hours: Temp:  [87.4 F (30.8 C)-98.5 F (36.9 C)] 98.4 F (36.9 C) (09/08 0345) Pulse Rate:  [44-66] 44 (09/08 0737) Resp:  [10-18] 16 (09/08 0345) BP: (84-141)/(35-81) 84/44 (09/08 0737) SpO2:  [94 %-100 %] 97 % (09/08 0549) Weight:  [94.8 kg (209 lb)] 94.8 kg (209 lb) (09/07 1048)  Intake/Output from previous day: 09/07 0701 - 09/08 0700 In: 3785 [P.O.:960; I.V.:2825] Out: 855 [Urine:670; Drains:135; Blood:50] Intake/Output this shift: No intake/output data recorded.  Physical Exam:  Constitutional: Vital signs reviewed. WD WN in NAD   Eyes: PERRL, No scleral icterus.   Cardiovascular: Low rate Pulmonary/Chest: Normal effort Abdominal: Soft. Appropriately tender. Incisions look fine.  Extremities: No cyanosis or edema   Lab Results:  Recent Labs  05/17/17 1619 05/18/17 0544  HGB 10.7* 9.8*  HCT 33.2* 30.4*   BMET  Recent Labs  05/18/17 0544  NA 138  K 3.8  CL 104  CO2 26  GLUCOSE 132*  BUN 12  CREATININE 0.78  CALCIUM 8.5*   No results for input(s): LABPT, INR in the last 72 hours. No results for input(s): LABURIN in the last 72 hours. No results found for this or any previous visit.  Studies/Results: No results found.  Assessment/Plan:   POD 1 rt part. Neph. Somewhat lor HR and BP but no signs of active bleeding. Hct sl lower. UOP excellent, JP drain serosanguinous.   Will recheck hct in am. D/C scop patch Begin IV tylenol Advance diet as tol OOB in chair--will hold off on ambulation til BP better Reassured pt   LOS: 1 day   Franchot Gallo M 05/18/2017, 8:46 AM

## 2017-05-19 LAB — CREATININE, FLUID (PLEURAL, PERITONEAL, JP DRAINAGE): CREAT FL: 0.7 mg/dL

## 2017-05-19 LAB — HEMOGLOBIN AND HEMATOCRIT, BLOOD
HCT: 28.2 % — ABNORMAL LOW (ref 36.0–46.0)
Hemoglobin: 9.1 g/dL — ABNORMAL LOW (ref 12.0–15.0)

## 2017-05-19 NOTE — Progress Notes (Signed)
2 Days Post-Op Subjective: Patient reports feels better--Ofirmev helping w/ pain. + flatus. Minimal nausea  Objective: Vital signs in last 24 hours: Temp:  [98.8 F (37.1 C)-99.8 F (37.7 C)] 98.8 F (37.1 C) (09/09 0529) Pulse Rate:  [41-58] 49 (09/09 0529) Resp:  [15-16] 16 (09/09 0529) BP: (84-113)/(38-61) 103/47 (09/09 0529) SpO2:  [94 %-100 %] 98 % (09/09 0529)  Intake/Output from previous day: 09/08 0701 - 09/09 0700 In: 1845 [P.O.:120; I.V.:1425; IV Piggyback:300] Out: 2836 [Urine:2750; Drains:705] Intake/Output this shift: No intake/output data recorded.  Physical Exam:  Constitutional: Vital signs reviewed. WD WN in NAD   Eyes: PERRL, No scleral icterus.   Cardiovascular: Decreased rate. Pulmonary/Chest: Normal effort Abdominal: Soft. Sl. tympanitic. Appropriate tenderness, wounds C/D/I   Lab Results:  Recent Labs  05/17/17 1619 05/18/17 0544 05/19/17 0510  HGB 10.7* 9.8* 9.1*  HCT 33.2* 30.4* 28.2*   BMET  Recent Labs  05/18/17 0544  NA 138  K 3.8  CL 104  CO2 26  GLUCOSE 132*  BUN 12  CREATININE 0.78  CALCIUM 8.5*   No results for input(s): LABPT, INR in the last 72 hours. No results for input(s): LABURIN in the last 72 hours. No results found for this or any previous visit.  Studies/Results: No results found.  Assessment/Plan:   POD 2 rt part nephrectomy. Overall looking better.   JP drainage up--will send for creatinine.  Hct down some--will follow  Ambulate, d/c foley, sl IV   LOS: 2 days   Franchot Gallo M 05/19/2017, 7:18 AM

## 2017-05-20 LAB — HEMOGLOBIN AND HEMATOCRIT, BLOOD
HEMATOCRIT: 28.6 % — AB (ref 36.0–46.0)
Hemoglobin: 9.1 g/dL — ABNORMAL LOW (ref 12.0–15.0)

## 2017-05-20 NOTE — Op Note (Signed)
NAME:  SHANERA, MESKE                   ACCOUNT NO.:  MEDICAL RECORD NO.:  [id number]  LOCATION:                                 FACILITY:  PHYSICIAN:  Alexis Frock, MD          DATE OF BIRTH:  DATE OF PROCEDURE: 05/17/2017                              OPERATIVE REPORT   DIAGNOSIS:  Small right renal mass.  PROCEDURES: 1. Robotic-assisted laparoscopic right partial nephrectomy. 2. Intraoperative ultrasound with interpretation.  ESTIMATED BLOOD LOSS:  50 mL.  COMPLICATION:  None.  SPECIMENS:  Right partial nephrectomy for permanent pathology.  FINDINGS: 1. Approximately 80% exophytic right lateral renal mass by direct     visualization and intraoperative ultrasound. 2. Single artery, single vein right renovascular anatomy. 3. Moderate intraabdominal adhesions, mostly omental, some right     pericolonic consistent with prior cholecystectomy.  ASSISTANT:  Debbrah Alar, PA.  DRAINS: 1. Jackson-Pratt drain to bulb suction. 2. Foley catheter to straight drain.  INDICATION:  Ms. Laskowski is a very pleasant 61 year old woman who was found incidentally to have a right renal mass that was solid and enhancing.  This was confirmed on dedicated MRI, this was concerning for localized renal neoplasm.  She was referred for consideration of management options.  Options were discussed including surveillance protocols, which, she does meet criteria for versus ablative therapy versus surgical extirpation with and without nephron sparing, and she adamantly wished to proceed with partial nephrectomy.  Informed consent was obtained and placed in the medical record.  PROCEDURE IN DETAIL:  The patient being [facility], was verified. Procedure being right robotic partial nephrectomy was confirmed. Procedure was carried out.  Time-out was performed.  Intravenous antibiotics were administered.  Foley catheter was placed per urethra to straight drain.  General endotracheal anesthesia was  introduced.  The patient then placed into a right side up full flank position, applying 15 degrees of stable flexion, superior arm elevator, axillary roll, sequential compression devices, bottom leg bent, top leg straight.  She was further fashioned to the operating table using 3-inch tape over foam padding across her supraxiphoid chest and her pelvis.  Beanbag was also deployed as her superior arm elevator.  All bony prominences were carefully padded.  Next, a high-flow, low pressure pneumoperitoneum was obtained using Veress technique in the right lower quadrant having passed the aspiration and drop test.  An 8-mm robotic camera port was placed and positioned approximately 1 handbreadth superolateral to the umbilicus.  Laparoscopic examination of the peritoneal cavity immediately revealed some significant adhesions, mostly omental, some pericolonic of the ascending colon and some along the inferior edge of the liver consistent with prior cholecystectomy.  Additional ports were then very carefully placed as follows:  Right subcostal 8-mm robotic port, right inferior paramedian 8-mm robotic port approximately 1 handbreadth superior to the pubic ramus and two 12-mm assistant port sites in the midline; one AirSeal type 2 fingerbreadths above the camera port and one non-AirSeal type 2 fingerbreadths below the camera port. Robot was docked and passed through the electronic checks.  Initial attention was directed at adhesiolysis, very careful adhesiolysis was performed versus some omental attachments between the omentum  and anterolateral abdominal wall, then of the inferior liver edge to allow more superior liver mobility.  Next, an additional 5-mm trocar was placed in the subxiphoid location.  Through which, a self-locking grasper was used to elevate the lower aspect of the liver to allow better visualization of the anterior surface of Gerota's fascia. Additional adhesiolysis was performed of  some pericolonic adhesions of the ascending colon.  With better visualization now, a right far lateral 8-mm robotic port was placed approximately 1 handbreadth superomedial to the anterior superior iliac spine.  The colon was then carefully swept medially the ascending from the area of the cecum to the area of the hepatic flexure, and the lower pole of the kidney was identified, placed on gentle lateral traction and the duodenum was encountered and very carefully kocherized medially, such that it lie completely medial to the lateral edge of the inferior vena cava.  The gonadal vein was encountered and visualized as it coursed into the inferior vena cava. Dissection proceeded just lateral to this and the right ureter was encountered, placed on gentle lateral traction and dissection was proceeded within this triangle towards the area of the renal hilum. Renal hilum consisted of single artery, single vein, renovascular anatomy as anticipated.  The artery was circumferentially mobilized and marked with vessel loop.  Attention was directed identifying the mass in question.  The anterior and lateral aspects of the kidney were defatted from the area of the hilum inferiorly according to suspected area of mass and the mass was encountered at approximately the level of the hilum, then a lateral orientation.  It appeared to be predominantly exophytic.  Intraoperative ultrasound was then performed using drop in probe.  Intraoperative ultrasound revealed a solid mass approximately 80% exophytic.  It was hypervascular as anticipated.  Using combination of the visual cues and ultrasound guidance, the edges for partial nephrectomy was scored using cautery.  Next, warm ischemia was achieved using two bulldog clamps on the artery alone and careful partial nephrectomy was performed using cold scissors keeping what appeared to be a rim of normal parenchyma with the partial nephrectomy specimen, which was  then placed into an EndoCatch bag for later retrieval.  First layer renorrhaphy was performed using running 3-0 V-Loc suture oversewing several small venous sinuses.  Second layer renorrhaphy performed using interrupted 0 Vicryl, sandwiched between Hem-O-Loks and lapper ties, which resulted in excellent parenchymal apposition. Bulldog clamps were then removed for total warm ischemia time of 12 minutes.  Hemostasis appeared excellent.  A 10 mL of FloSeal was applied on the area of the partial nephrectomy.  All needles were removed. Sponge and needle counts were correct.  At this point, given the extensive mobilization of the kidney, given the lateral posterior aspect of the mass in order to avoid renal ptosis, nephropexy was performed using running 2-0 V-Loc suture reapproximating lateral perirenal fascia to the posterolateral peri-psoas fat, but not directly to the psoas musculature, this resulted in excellent reapproximation of the anatomic position of the kidney.  At this point, all sponge and needle counts were correct.  The colon was carefully inspected.  Given adhesiolysis, no evidence of injury was noted.  A closed suction drain was brought through the previous lateral most robotic port site near the peritoneal cavity.  Vessel loop was removed.  Sponge and needle counts were again verified correct.  Robot was then undocked.  Specimen was retrieved by extending the previous inferior most assistant port site for distance approximately 2 cm removing the  partial nephrectomy specimen and closing the site at the level of the fascia using figure-of-eight PDS.  The superior most assistant port site was closed at the level of the fascia using Carter-Thomason suture passer under laparoscopic vision.  All incision sites were infiltrated with dilute lyophilized Marcaine and closed at the level of the skin using subcuticular Monocryl followed by Dermabond.  Procedure was then terminated.  The  patient tolerated the procedure well.  There were no immediate periprocedural complications. The patient was taken to the postanesthesia care unit in stable condition.  Please note, first assistant, Debbrah Alar, was absolutely crucial for all robotic portions of the procedure today.  She provided invaluable retraction, suctioning, clip application, vascular clamp application, navigating intraoperative ultrasound; without which, this would not be possible.          ______________________________ Alexis Frock, MD     TM/MEDQ  D:  05/17/2017  T:  05/17/2017  Job:  [id number]

## 2017-05-20 NOTE — Progress Notes (Signed)
Patient is alert,oriented x 4. Ambulatory without assist. Gait is steady.  Dressing RLQ CDI. Incisions intact. Discharge instructions reviewed with patient. Questions, concerns denied. Discussed used of pain medication adverse effects. Pt made aware not to take OTC in combination with Narcotic medication.

## 2017-05-20 NOTE — Progress Notes (Signed)
Pulled JP, per order. Patient tolerated well. Gauze dressing in place. Will continue to monitor.

## 2017-05-20 NOTE — Discharge Summary (Signed)
[facility] Discharge Summary  Admit date: 05/17/2017  Discharge date and time: 05/20/17   Discharge to: Home  Discharge Service: Urology  Discharge Attending Physician:  Dr. Tresa Moore  Discharge  Diagnoses: Right renal mass  Secondary Diagnosis: Active Problems:   Renal mass   OR Procedures: Procedure(s): XI ROBOTIC ASSITED PARTIAL NEPHRECTOMY 05/17/2017   Ancillary Procedures: None   Discharge Day Services: The patient was seen and examined by the Urology team both in the morning and immediately prior to discharge.  Vital signs and laboratory values were stable and within normal limits.  The physical exam was benign and unchanged and all surgical wounds were examined.  Discharge instructions were explained and all questions answered.  Subjective  No acute events overnight. Pain Controlled. No fever or chills.  Objective Patient Vitals for the past 8 hrs:  BP Temp Temp src Pulse Resp SpO2 Weight  05/20/17 0423 (!) 109/53 98.9 F (37.2 C) Oral (!) 52 15 100 % 98.4 kg (216 lb 14.9 oz)  05/19/17 2352 - 99.5 F (37.5 C) Oral - - - -   No intake/output data recorded.  General Appearance:        No acute distress Lungs:                       Normal work of breathing on room air Heart:                                Regular rate and rhythm Abdomen:                         Soft, non-tender, non-distended. Incisions c/d/i Extremities:                      Warm and well perfused   Hospital Course:  The patient underwent a right robotic partial nephrectomy for a small renal mass on 05/17/2017.  The patient tolerated the procedure well, was extubated in the OR, and afterwards was taken to the PACU for routine post-surgical care. When stable the patient was transferred to the floor.   The patient did well postoperatively.  The patient's diet was slowly advanced and at the time of discharge was tolerating a regular diet. The JP was checked for Cr, which was serum, and as such removed  prior to discharge. Hgb initially trended downward, but was stable at discharge at 9.1.  The patient was discharged home 3 Days Post-Op, at which point was tolerating a regular solid diet, was able to void spontaneously, have adequate pain control with P.O. pain medication, and could ambulate without difficulty. The patient will follow up with [place] for post op check.   Condition at Discharge: Improved  Discharge Medications:  Allergies as of 05/20/2017      Reactions   Iodinated Diagnostic Agents Anaphylaxis   Isovue [iopamidol] Itching   And hives    Nitrofurantoin Hives   Sulfa Antibiotics Hives   Adhesive [tape] Rash, Other (See Comments)   Paper tape ONLY   Macrobid [nitrofurantoin Monohyd Macro] Rash   Mango Flavor Swelling, Rash   Tongue swelling, feels like throat is closing in on her       Medication List    STOP taking these medications   aspirin EC 81 MG tablet   cholecalciferol 1000 units tablet Commonly known as:  VITAMIN D   ciprofloxacin 500  MG tablet Commonly known as:  CIPRO   COSAMIN DS PO   doxycycline 100 MG EC tablet Commonly known as:  DORYX   Magnesium 250 MG Tabs   metroNIDAZOLE 500 MG tablet Commonly known as:  FLAGYL   MULTI-VITAMINS Tabs   vitamin B-12 1000 MCG tablet Commonly known as:  CYANOCOBALAMIN     TAKE these medications   clonazePAM 0.5 MG tablet Commonly known as:  KLONOPIN Take 0.5 mg by mouth 2 (two) times daily as needed for anxiety.   esomeprazole 40 MG capsule Commonly known as:  NEXIUM Take 40 mg by mouth daily as needed (for acid reflux.).   HYDROcodone-acetaminophen 5-325 MG tablet Commonly known as:  NORCO/VICODIN Take 1-2 tablets by mouth every 6 (six) hours as needed for moderate pain. What changed:  how much to take   losartan-hydrochlorothiazide 100-25 MG tablet Commonly known as:  HYZAAR Take 1 tablet by mouth daily.   ondansetron 4 MG disintegrating tablet Commonly known as:  ZOFRAN ODT 4mg  ODT q4 hours  prn nausea/vomit   rosuvastatin 5 MG tablet Commonly known as:  CRESTOR Take 5 mg by mouth daily.            Discharge Care Instructions        Start     Ordered   05/20/17 0000  Diet - low sodium heart healthy     05/20/17 0708   05/20/17 0000  Increase activity slowly     05/20/17 0708   05/20/17 0000  Discharge instructions    Comments:  Activity:  You are encouraged to ambulate frequently (about every hour during waking hours) to help prevent blood clots from forming in your legs or lungs.  However, you should not engage in any heavy lifting (> 10-15 lbs), strenuous activity, or straining.  Diet: You should advance your diet as instructed by your physician.  It will be normal to have some bloating, nausea, and abdominal discomfort intermittently.  Prescriptions:  You will be provided a prescription for pain medication to take as needed.  If your pain is not severe enough to require the prescription pain medication, you may take extra strength Tylenol instead which will have less side effects.  You should also take a prescribed stool softener to avoid straining with bowel movements as the prescription pain medication may constipate you.  Incisions: You may remove your dressing bandages 48 hours after surgery if not removed in the hospital.  You will either have some small staples or special tissue glue at each of the incision sites. Once the bandages are removed (if present), the incisions may stay open to air.  You may start showering (but not soaking or bathing in water) the 2nd day after surgery and the incisions simply need to be patted dry after the shower.  No additional care is needed.  What to call us about: You should call the office [phone]) if you develop fever > 101 or develop persistent vomiting.   05/20/17 0708   05/20/17 0000  Call MD for:  temperature >100.4     05/20/17 0708   05/20/17 0000  Call MD for:  persistant nausea and vomiting     05/20/17 0708    05/20/17 0000  Call MD for:  severe uncontrolled pain     05/20/17 0708   05/20/17 0000  Call MD for:  difficulty breathing, headache or visual disturbances     05/20/17 0708   05/20/17 0000  Call MD for:  redness, tenderness, or  signs of infection (pain, swelling, redness, odor or green/yellow discharge around incision site)     05/20/17 0708   05/17/17 0000  HYDROcodone-acetaminophen (NORCO/VICODIN) 5-325 MG tablet  Every 6 hours PRN    Question:  Supervising Provider  AnswerTresa Moore, Hubbard Robinson   05/17/17 1518

## 2017-05-22 ENCOUNTER — Telehealth: Payer: Self-pay | Admitting: General Surgery

## 2017-05-22 NOTE — Telephone Encounter (Signed)
Spoke with patient overnight who states that she had a temperature of 100.7. Otherwise feeling bloated and denies BM since surgery. Spoke with her about options including coming to the ED for evaluation, and taking antipyretics overnight and CTM. She elected for the latter.   Spoke with patient this AM, T down to 99.15F. Feels slightly better. Still no BM. Encouraged her to try a suppository, enema, or Mg Citrate. Also gave call back instructions including if fever returned. All questions answered.

## 2017-06-11 ENCOUNTER — Other Ambulatory Visit (HOSPITAL_COMMUNITY): Payer: Self-pay | Admitting: Internal Medicine

## 2017-06-11 DIAGNOSIS — Z780 Asymptomatic menopausal state: Secondary | ICD-10-CM

## 2017-06-13 ENCOUNTER — Other Ambulatory Visit (HOSPITAL_COMMUNITY): Payer: Self-pay | Admitting: Internal Medicine

## 2017-06-13 DIAGNOSIS — R1011 Right upper quadrant pain: Secondary | ICD-10-CM

## 2017-06-17 ENCOUNTER — Ambulatory Visit (HOSPITAL_COMMUNITY)
Admission: RE | Admit: 2017-06-17 | Discharge: 2017-06-17 | Disposition: A | Discharge status: Home / Self Care | Payer: 59 | Source: Ambulatory Visit | Attending: Internal Medicine | Admitting: Internal Medicine

## 2017-06-17 DIAGNOSIS — R1011 Right upper quadrant pain: Secondary | ICD-10-CM | POA: Insufficient documentation

## 2017-06-17 DIAGNOSIS — Z9049 Acquired absence of other specified parts of digestive tract: Secondary | ICD-10-CM | POA: Diagnosis not present

## 2017-06-19 ENCOUNTER — Ambulatory Visit (HOSPITAL_COMMUNITY)
Admission: RE | Admit: 2017-06-19 | Discharge: 2017-06-19 | Disposition: A | Discharge status: Home / Self Care | Payer: 59 | Source: Ambulatory Visit | Attending: Internal Medicine | Admitting: Internal Medicine

## 2017-06-19 DIAGNOSIS — Z780 Asymptomatic menopausal state: Secondary | ICD-10-CM | POA: Diagnosis present

## 2017-06-24 ENCOUNTER — Encounter: Payer: Self-pay | Admitting: Internal Medicine

## 2017-06-27 ENCOUNTER — Emergency Department (HOSPITAL_COMMUNITY): Payer: 59

## 2017-06-27 ENCOUNTER — Emergency Department (HOSPITAL_COMMUNITY)
Admission: EM | Admit: 2017-06-27 | Discharge: 2017-06-28 | Disposition: A | Discharge status: Home / Self Care | Payer: 59 | Attending: Emergency Medicine | Admitting: Emergency Medicine

## 2017-06-27 ENCOUNTER — Encounter (HOSPITAL_COMMUNITY): Payer: Self-pay | Admitting: Emergency Medicine

## 2017-06-27 DIAGNOSIS — I10 Essential (primary) hypertension: Secondary | ICD-10-CM | POA: Insufficient documentation

## 2017-06-27 DIAGNOSIS — R1011 Right upper quadrant pain: Secondary | ICD-10-CM | POA: Diagnosis present

## 2017-06-27 DIAGNOSIS — R109 Unspecified abdominal pain: Secondary | ICD-10-CM | POA: Diagnosis not present

## 2017-06-27 DIAGNOSIS — Z79899 Other long term (current) drug therapy: Secondary | ICD-10-CM | POA: Insufficient documentation

## 2017-06-27 DIAGNOSIS — E876 Hypokalemia: Secondary | ICD-10-CM | POA: Insufficient documentation

## 2017-06-27 DIAGNOSIS — J45909 Unspecified asthma, uncomplicated: Secondary | ICD-10-CM | POA: Diagnosis not present

## 2017-06-27 HISTORY — DX: Diverticulitis of intestine, part unspecified, without perforation or abscess without bleeding: K57.92

## 2017-06-27 LAB — URINALYSIS, ROUTINE W REFLEX MICROSCOPIC
Bilirubin Urine: NEGATIVE
Glucose, UA: NEGATIVE mg/dL
HGB URINE DIPSTICK: NEGATIVE
Ketones, ur: NEGATIVE mg/dL
LEUKOCYTES UA: NEGATIVE
NITRITE: NEGATIVE
PROTEIN: NEGATIVE mg/dL
SPECIFIC GRAVITY, URINE: 1.01 (ref 1.005–1.030)
pH: 7 (ref 5.0–8.0)

## 2017-06-27 LAB — COMPREHENSIVE METABOLIC PANEL
ALBUMIN: 3.9 g/dL (ref 3.5–5.0)
ALK PHOS: 104 U/L (ref 38–126)
ALT: 20 U/L (ref 14–54)
AST: 23 U/L (ref 15–41)
Anion gap: 9 (ref 5–15)
BUN: 12 mg/dL (ref 6–20)
CALCIUM: 9.4 mg/dL (ref 8.9–10.3)
CHLORIDE: 104 mmol/L (ref 101–111)
CO2: 26 mmol/L (ref 22–32)
CREATININE: 0.66 mg/dL (ref 0.44–1.00)
GFR calc non Af Amer: >60 mL/min (ref >60)
GLUCOSE: 101 mg/dL — AB (ref 65–99)
Potassium: 3.2 mmol/L — ABNORMAL LOW (ref 3.5–5.1)
SODIUM: 139 mmol/L (ref 135–145)
Total Bilirubin: 0.6 mg/dL (ref 0.3–1.2)
Total Protein: 7.4 g/dL (ref 6.5–8.1)

## 2017-06-27 LAB — CBC WITH DIFFERENTIAL/PLATELET
BASOS ABS: 0 10*3/uL (ref 0.0–0.1)
Basophils Relative: 1 %
Eosinophils Absolute: 0.5 10*3/uL (ref 0.0–0.7)
Eosinophils Relative: 5 %
HEMATOCRIT: 33.8 % — AB (ref 36.0–46.0)
Hemoglobin: 10.8 g/dL — ABNORMAL LOW (ref 12.0–15.0)
LYMPHS ABS: 2.5 10*3/uL (ref 0.7–4.0)
LYMPHS PCT: 30 %
MCH: 28.1 pg (ref 26.0–34.0)
MCHC: 32 g/dL (ref 30.0–36.0)
MCV: 87.8 fL (ref 78.0–100.0)
MONO ABS: 0.5 10*3/uL (ref 0.1–1.0)
Monocytes Relative: 6 %
NEUTROS ABS: 5.1 10*3/uL (ref 1.7–7.7)
Neutrophils Relative %: 58 %
Platelets: 148 10*3/uL — ABNORMAL LOW (ref 150–400)
RBC: 3.85 MIL/uL — AB (ref 3.87–5.11)
RDW: 15.8 % — AB (ref 11.5–15.5)
WBC: 8.6 10*3/uL (ref 4.0–10.5)

## 2017-06-27 LAB — LIPASE, BLOOD: Lipase: 26 U/L (ref 11–51)

## 2017-06-27 LAB — TROPONIN I: Troponin I: <0.03 ng/mL (ref <0.03)

## 2017-06-27 MED ORDER — DICYCLOMINE HCL 20 MG PO TABS: 20.0000 mg | ORAL_TABLET | Freq: Four times a day (QID) | ORAL | 0 refills | Status: DC | PRN

## 2017-06-27 MED ORDER — MORPHINE SULFATE (PF) 4 MG/ML IV SOLN
4.0000 mg | INTRAVENOUS | Status: DC | PRN
  Administered 2017-06-27: 4 mg via INTRAVENOUS
  Filled 2017-06-27: qty 1

## 2017-06-27 MED ORDER — HYDROCODONE-ACETAMINOPHEN 5-325 MG PO TABS: ORAL_TABLET | ORAL | 0 refills | Status: DC

## 2017-06-27 MED ORDER — POTASSIUM CHLORIDE CRYS ER 20 MEQ PO TBCR
40.0000 meq | EXTENDED_RELEASE_TABLET | Freq: Once | ORAL | Status: AC
  Administered 2017-06-27: 40 meq via ORAL
  Filled 2017-06-27: qty 2

## 2017-06-27 NOTE — ED Notes (Signed)
Tried for an IV, but did not get it. Am asking another RN to try so that I am able to give Morphine and also draw blood

## 2017-06-27 NOTE — ED Triage Notes (Signed)
Intermittent for a while, pain under rib cage on right side, particle kidney removed in September

## 2017-06-27 NOTE — Discharge Instructions (Signed)
Eat a bland diet, avoiding greasy, fatty, fried foods, as well as spicy and acidic foods or beverages.  Avoid eating within 2 to 3 hours before going to bed or laying down.  Also avoid teas, colas, coffee, chocolate, pepermint and spearment.  Take over the counter pepcid, one tablet by mouth twice a day, for the next 2 to 3 weeks.  May also take over the counter maalox/mylanta, as directed on packaging, as needed for discomfort.  Take the prescriptions as directed.  Call your regular medical doctor tomorrow to schedule a follow up appointment within the next 3 days. Call your GI doctor tomorrow to confirm your previously scheduled follow up appointment for next week.  Return to the Emergency Department immediately if worsening.

## 2017-06-27 NOTE — ED Provider Notes (Signed)
[facility] EMERGENCY DEPARTMENT Provider Note   CSN: [id number] Arrival date & time: 06/27/17  1739     History   Chief Complaint Chief Complaint  Patient presents with  . Abdominal Pain    HPI Rebecca Scott is a 61 y.o. female.   Abdominal Pain      Pt was seen at 2100. Per pt, c/o gradual onset and worsening of persistent right upper abd "pain" for the past 2 months, worse since yesterday. Pt describes the pain as "aching," and "bloating," with radiation into her mid-epigastric and right flank areas. Pt has been evaluated by her Urologist as well as her PMD for this complaint and has a referral to GI MD. Denies CP/palpitations, no SOB/cough, no N/V/D, no fevers, no rash.   Past Medical History:  Diagnosis Date  . Anemia   . Asthma    seasonal asthma rarely   . Chronic kidney disease    renal mass right kidney   . CPAP (continuous positive airway pressure) dependence   . Diverticulitis   . GERD (gastroesophageal reflux disease)   . Hemorrhoids   . Hyperlipemia   . Hypertension   . Migraine   . PONV (postoperative nausea and vomiting)    patient reports being "slower to wake than average "   . Pre-diabetes    "ive been told i'm borderline"   . Sleep apnea    CPAP use     Patient Active Problem List   Diagnosis Date Noted  . Renal mass 05/17/2017  . HTN (hypertension) 07/29/2014  . Hyperlipidemia 07/29/2014  . Syncope 07/29/2014    Past Surgical History:  Procedure Laterality Date  . ABDOMINAL HYSTERECTOMY    . APPENDECTOMY    . CHOLECYSTECTOMY    . NASAL SEPTUM SURGERY    . ROBOTIC ASSITED PARTIAL NEPHRECTOMY Right 05/17/2017   Procedure: XI ROBOTIC ASSITED PARTIAL NEPHRECTOMY;  Surgeon: Alexis Frock, MD;  Location: [facility]S;  Service: Urology;  Laterality: Right;  . TONSILLECTOMY     per patient not surgically removed " i htink they may have rotted out "     OB History    Gravida Para Term Preterm AB Living             1   SAB TAB Ectopic  Multiple Live Births                   Home Medications    Prior to Admission medications   Medication Sig Start Date End Date Taking? Authorizing Provider  clonazePAM (KLONOPIN) 0.5 MG tablet Take 0.5 mg by mouth 2 (two) times daily as needed for anxiety.    [provider]  esomeprazole (NEXIUM) 40 MG capsule Take 40 mg by mouth daily as needed (for acid reflux.).     [provider]  HYDROcodone-acetaminophen (NORCO/VICODIN) 5-325 MG tablet Take 1-2 tablets by mouth every 6 (six) hours as needed for moderate pain. 05/17/17   Debbrah Alar, PA-C  losartan-hydrochlorothiazide (HYZAAR) 100-25 MG tablet Take 1 tablet by mouth daily. 01/15/17   [provider]  ondansetron (ZOFRAN ODT) 4 MG disintegrating tablet 4mg  ODT q4 hours prn nausea/vomit Patient not taking: Reported on 05/06/2017 02/09/17   Milton Ferguson, MD  rosuvastatin (CRESTOR) 5 MG tablet Take 5 mg by mouth daily.  08/18/15   [provider]    Family History No family history on file.  Social History Social History  Substance Use Topics  . Smoking status: Never Smoker  .  Smokeless tobacco: Never Used  . Alcohol use Yes     Comment: rarely      Allergies   Iodinated diagnostic agents; Isovue [iopamidol]; Nitrofurantoin; Sulfa antibiotics; Adhesive [tape]; Macrobid [nitrofurantoin monohyd macro]; and Regulatory affairs officer   Review of Systems Review of Systems  Gastrointestinal: Positive for abdominal pain.  ROS: Statement: All systems negative except as marked or noted in the HPI; Constitutional: Negative for fever and chills. ; ; Eyes: Negative for eye pain, redness and discharge. ; ; ENMT: Negative for ear pain, hoarseness, nasal congestion, sinus pressure and sore throat. ; ; Cardiovascular: Negative for chest pain, palpitations, diaphoresis, dyspnea and peripheral edema. ; ; Respiratory: Negative for cough, wheezing and stridor. ; ; Gastrointestinal: +abd pain. Negative for nausea, vomiting,  diarrhea, blood in stool, hematemesis, jaundice and rectal bleeding. . ; ; Genitourinary: Negative for dysuria, flank pain and hematuria. ; ; Musculoskeletal: Negative for back pain and neck pain. Negative for swelling and trauma.; ; Skin: Negative for pruritus, rash, abrasions, blisters, bruising and skin lesion.; ; Neuro: Negative for headache, lightheadedness and neck stiffness. Negative for weakness, altered level of consciousness, altered mental status, extremity weakness, paresthesias, involuntary movement, seizure and syncope.        Physical Exam Updated Vital Signs BP 126/65 (BP Location: Right Arm)   Pulse (!) 57   Temp 98.6 F (37 C) (Oral)   Resp 19   Ht 5\' 3"  (1.6 m)   Wt 93 kg (205 lb)   SpO2 100%   BMI 36.31 kg/m   Physical Exam 2105: Physical examination:  Nursing notes reviewed; Vital signs and O2 SAT reviewed;  Constitutional: Well developed, Well nourished, Well hydrated, In no acute distress; Head:  Normocephalic, atraumatic; Eyes: EOMI, PERRL, No scleral icterus; ENMT: Mouth and pharynx normal, Mucous membranes moist; Neck: Supple, Full range of motion, No lymphadenopathy; Cardiovascular: Regular rate and rhythm, No gallop; Respiratory: Breath sounds clear & equal bilaterally, No wheezes.  Speaking full sentences with ease, Normal respiratory effort/excursion; Chest: Nontender, Movement normal. No rash.; Abdomen: Soft, +RUQ and mid-epigastric area tenderness to palp. No rebound or guarding. Nondistended, Normal bowel sounds; Genitourinary: No CVA tenderness; Spine:  No midline CS, TS, LS tenderness. No rash.;; Extremities: Pulses normal, No tenderness, No edema, No calf edema or asymmetry.; Neuro: AA&Ox3, Major CN grossly intact.  Speech clear. No gross focal motor or sensory deficits in extremities. Climbs on and off stretcher easily by herself. Gait steady..; Skin: Color normal, Warm, Dry.   ED Treatments / Results  Labs (all labs ordered are listed, but only abnormal  results are displayed)   EKG  EKG Interpretation  Date/Time:  Thursday June 27 2017 22:26:04 EDT Ventricular Rate:  70 PR Interval:    QRS Duration: 105 QT Interval:  495 QTC Calculation: 535 R Axis:   77 Text Interpretation:  Sinus rhythm Low voltage, precordial leads Borderline T abnormalities, anterior leads Prolonged QT interval When compared with ECG of 09/18/2015 and  05/20/2009 QT has lengthened Confirmed by Francine Graven [phone]) on 06/27/2017 10:46:16 PM       Radiology   Procedures Procedures (including critical care time)  Medications Ordered in ED Medications  morphine 4 MG/ML injection 4 mg (4 mg Intravenous Given 06/27/17 2213)     Initial Impression / Assessment and Plan / ED Course  I have reviewed the triage vital signs and the nursing notes.  Pertinent labs & imaging results that were available during my care of the patient were reviewed by me  and considered in my medical decision making (see chart for details).  MDM Reviewed: previous chart, nursing note and vitals Reviewed previous: labs, ECG and ultrasound Interpretation: labs, ECG and x-ray    Results for orders placed or performed during the hospital encounter of 06/27/17  Urinalysis, Routine w reflex microscopic  Result Value Ref Range   Color, Urine STRAW (A) YELLOW   APPearance CLEAR CLEAR   Specific Gravity, Urine 1.010 1.005 - 1.030   pH 7.0 5.0 - 8.0   Glucose, UA NEGATIVE NEGATIVE mg/dL   Hgb urine dipstick NEGATIVE NEGATIVE   Bilirubin Urine NEGATIVE NEGATIVE   Ketones, ur NEGATIVE NEGATIVE mg/dL   Protein, ur NEGATIVE NEGATIVE mg/dL   Nitrite NEGATIVE NEGATIVE   Leukocytes, UA NEGATIVE NEGATIVE  Comprehensive metabolic panel  Result Value Ref Range   Sodium 139 135 - 145 mmol/L   Potassium 3.2 (L) 3.5 - 5.1 mmol/L   Chloride 104 101 - 111 mmol/L   CO2 26 22 - 32 mmol/L   Glucose, Bld 101 (H) 65 - 99 mg/dL   BUN 12 6 - 20 mg/dL   Creatinine, Ser 0.66 0.44 - 1.00 mg/dL     Calcium 9.4 8.9 - 10.3 mg/dL   Total Protein 7.4 6.5 - 8.1 g/dL   Albumin 3.9 3.5 - 5.0 g/dL   AST 23 15 - 41 U/L   ALT 20 14 - 54 U/L   Alkaline Phosphatase 104 38 - 126 U/L   Total Bilirubin 0.6 0.3 - 1.2 mg/dL   GFR calc non Af Amer >60 >60 mL/min   GFR calc Af Amer >60 >60 mL/min   Anion gap 9 5 - 15  Lipase, blood  Result Value Ref Range   Lipase 26 11 - 51 U/L  Troponin I  Result Value Ref Range   Troponin I <0.03 <0.03 ng/mL  CBC with Differential  Result Value Ref Range   WBC 8.6 4.0 - 10.5 K/uL   RBC 3.85 (L) 3.87 - 5.11 MIL/uL   Hemoglobin 10.8 (L) 12.0 - 15.0 g/dL   HCT 33.8 (L) 36.0 - 46.0 %   MCV 87.8 78.0 - 100.0 fL   MCH 28.1 26.0 - 34.0 pg   MCHC 32.0 30.0 - 36.0 g/dL   RDW 15.8 (H) 11.5 - 15.5 %   Platelets 148 (L) 150 - 400 K/uL   Neutrophils Relative % 58 %   Neutro Abs 5.1 1.7 - 7.7 K/uL   Lymphocytes Relative 30 %   Lymphs Abs 2.5 0.7 - 4.0 K/uL   Monocytes Relative 6 %   Monocytes Absolute 0.5 0.1 - 1.0 K/uL   Eosinophils Relative 5 %   Eosinophils Absolute 0.5 0.0 - 0.7 K/uL   Basophils Relative 1 %   Basophils Absolute 0.0 0.0 - 0.1 K/uL    US Abdomen Limited Ruq Result Date: 06/17/2017 CLINICAL DATA:  Right upper quadrant pain ; history of previous cholecystectomy. EXAM: ULTRASOUND ABDOMEN LIMITED RIGHT UPPER QUADRANT COMPARISON:  Abdominal CT scan of February 09, 2017 FINDINGS: Gallbladder: The gallbladder is surgically absent. Common bile duct: Diameter: 10-13 mm. Liver: No focal lesion identified. The echotexture is mildly increased. The surface contour is smooth. There is no intrahepatic ductal dilation. Portal vein is patent on color Doppler imaging with normal direction of blood flow towards the liver. IMPRESSION: Previous cholecystectomy. Post cholecystectomy dilation of the common bile duct unchanged in appearance from the abdominal CT of February 09, 2017. Increased hepatic echotexture most compatible with fatty infiltrative change.  Electronically  Signed   By: David  Martinique M.D.   On: 06/17/2017 08:10   Dg Chest 2 View Result Date: 06/27/2017 CLINICAL DATA:  Right-sided lower chest pain for 2 days. EXAM: CHEST  2 VIEW COMPARISON:  12/17/2016 FINDINGS: Slightly shallow inspiration. Heart size and pulmonary vascularity are normal. No airspace disease or consolidation in the lungs. No blunting of costophrenic angles. No pneumothorax. Mediastinal contours appear intact. IMPRESSION: No active cardiopulmonary disease. Electronically Signed   By: Lucienne Capers M.D.   On: 06/27/2017 22:46   Ct Abdomen Pelvis Wo Contrast Result Date: 06/27/2017 CLINICAL DATA:  Pain under the right ribcage. Portion of the kidney removed in September. Allergic to IV contrast material. EXAM: CT ABDOMEN AND PELVIS WITHOUT CONTRAST TECHNIQUE: Multidetector CT imaging of the abdomen and pelvis was performed following the standard protocol without IV contrast. COMPARISON:  02/09/2017 FINDINGS: Lower chest: The lung bases are clear. Hepatobiliary: No focal liver abnormality is seen. Status post cholecystectomy. No biliary dilatation. Pancreas: Unremarkable. No pancreatic ductal dilatation or surrounding inflammatory changes. Spleen: Normal in size without focal abnormality. Adrenals/Urinary Tract: No adrenal gland nodules. Postoperative changes along the posterior mid pole of the right kidney with present suture material and stranding consistent with recent postoperative change. No loculated fluid collections. Cyst on the left kidney without change. Punctate sized stone in the upper pole left kidney. No hydronephrosis or hydroureter on either side. Bladder is decompressed. No bladder filling defects identified. Stomach/Bowel: Contrast material flows through to the colon without evidence of bowel obstruction. Stomach and small bowel are unremarkable without evidence of distention or wall thickening. Scattered stool throughout the colon. Diverticula in the sigmoid region. No  inflammatory changes. No colonic distention or wall thickening. Vascular/Lymphatic: A few scattered aortic calcifications are present. No aortic aneurysm. No significant lymphadenopathy. Reproductive: Status post hysterectomy. No adnexal masses. Other: No free air or free fluid in the abdomen. Infiltration in the midline anterior abdominal wall likely represents postoperative change. No loculated fluid collections. Musculoskeletal: Mild degenerative changes in the spine. No destructive bone lesions or acute displaced fractures identified. IMPRESSION: 1. Postoperative changes consistent with partial right nephrectomy. Stranding in the perinephric space. No loculated fluid collections. 2. Nonobstructing stone in the upper pole left kidney. Small left renal cyst. 3. No evidence of bowel obstruction or inflammation. 4. Aortic atherosclerosis. Electronically Signed   By: Lucienne Capers M.D.   On: 06/27/2017 23:25    2330:  Potassium repleted PO. H/H per baseline. Workup otherwise reassuring. VS remain stable. Pt has tol PO well without N/V. States she would like to go home now. Tx symptomatically at this time; f/u with GI MD as previously arranged. Dx and testing d/w pt and family.  Questions answered.  Verb understanding, agreeable to d/c home with outpt f/u.      Final Clinical Impressions(s) / ED Diagnoses   Final diagnoses:  None    New Prescriptions New Prescriptions   No medications on file      Francine Graven, DO 07/01/17 3154

## 2017-07-04 ENCOUNTER — Ambulatory Visit (INDEPENDENT_AMBULATORY_CARE_PROVIDER_SITE_OTHER): Payer: 59 | Admitting: Internal Medicine

## 2017-07-04 ENCOUNTER — Encounter (INDEPENDENT_AMBULATORY_CARE_PROVIDER_SITE_OTHER): Payer: Self-pay | Admitting: *Deleted

## 2017-07-04 ENCOUNTER — Encounter (INDEPENDENT_AMBULATORY_CARE_PROVIDER_SITE_OTHER): Payer: Self-pay | Admitting: Internal Medicine

## 2017-07-04 VITALS — BP 112/82 | HR 68 | Temp 98.7°F | Resp 18 | Ht 63.0 in | Wt 208.5 lb

## 2017-07-04 DIAGNOSIS — K219 Gastro-esophageal reflux disease without esophagitis: Secondary | ICD-10-CM | POA: Diagnosis not present

## 2017-07-04 DIAGNOSIS — R1011 Right upper quadrant pain: Secondary | ICD-10-CM | POA: Diagnosis not present

## 2017-07-04 MED ORDER — LIDOCAINE 5 % EX PTCH: 1.0000 | MEDICATED_PATCH | CUTANEOUS | 1 refills | Status: DC

## 2017-07-04 MED ORDER — PANTOPRAZOLE SODIUM 40 MG PO TBEC: 40.0000 mg | DELAYED_RELEASE_TABLET | Freq: Every day | ORAL | 5 refills | Status: DC

## 2017-07-04 NOTE — Patient Instructions (Signed)
Apply Lidoderm patch as directed. 12 hours on 12 hours off. Esophagogastroduodenoscopy to be scheduled.

## 2017-07-04 NOTE — Progress Notes (Signed)
Reason for consultatio.;  Right upper quadrant abdominal pain and nausea.  History of present illness:  Rebecca Scott 61-year-old Caucasian female who is referred through courtesy of Dr. Willey Blade for GI evaluation. She is accompanied by her friend Rebecca Scott. She began to have right upper quadrant/subcostal pain in August 2018. This pain was sporadic and associated with nausea. She felt as if she had pressure in this area and would last from a few minutes to several hours. Back in June 2018 she was evaluated in emergency room for dysuria and hematuria and felt to have diverticulitis and treated with Cipro and Flagyl. Unenhanced CT revealed right renal lesion which was further evaluated with ultrasound and MR and was felt to be renal cell carcinoma. She states she had robotic surgery on 05/17/2017. She spent 3 days in the hospital because of low heart rate and hypotension. She had some postop pain which has gradually resolved. About 3 weeks ago she began to have pain and right upper quadrant and would last almost all day. She felt as if she has baby's foot stuck in the rib cage and other times she would feel as if there is softball and right upper quadrant. She has noted intermittent retrosternal pain. She also has had low-grade fever. She's had nausea but no vomiting. This pain has been radiating into her back. At times his pain is sharp and intense. She had one episode like this on 06/27/2017 when she was seen in emergency room and had lab studies including CBC comprehensive chemistry panel lipase and troponin level. Lipase and troponin levels were normal. LFTs were also normal. She was discharged in dicyclomine which she took for a few days and did not see any benefit and therefore stopped this medication. She feels worse. She has been burping frequently. She also complains of regurgitation heartburn and has early satiety. However she has not lost any weight. Her bowels been irregular since she had kidney  surgery. She may have couple stools on a given day and then may skip for few days. She denies melena or rectal bleeding. There is no history of fall or blunt injury to her abdomen. She also denies back problems.    Current Medications: Outpatient Encounter Prescriptions as of 07/04/2017  Medication Sig  . clonazePAM (KLONOPIN) 0.5 MG tablet Take 0.5 mg by mouth 2 (two) times daily as needed for anxiety.  Marland Kitchen losartan-hydrochlorothiazide (HYZAAR) 100-25 MG tablet Take 1 tablet by mouth daily.  . ondansetron (ZOFRAN ODT) 4 MG disintegrating tablet 4mg  ODT q4 hours prn nausea/vomit  . rosuvastatin (CRESTOR) 5 MG tablet Take 5 mg by mouth daily.   Marland Kitchen dicyclomine (BENTYL) 20 MG tablet Take 1 tablet (20 mg total) by mouth every 6 (six) hours as needed for spasms (abdominal cramping). (Patient not taking: Reported on 07/04/2017)  . HYDROcodone-acetaminophen (NORCO/VICODIN) 5-325 MG tablet Take 1-2 tablets by mouth every 6 (six) hours as needed for moderate pain. (Patient not taking: Reported on 07/04/2017)  . [DISCONTINUED] esomeprazole (NEXIUM) 40 MG capsule Take 40 mg by mouth daily as needed (for acid reflux.).   . [DISCONTINUED] HYDROcodone-acetaminophen (NORCO/VICODIN) 5-325 MG tablet 1 or 2 tabs PO q6 hours prn pain (Patient not taking: Reported on 07/04/2017)   No facility-administered encounter medications on file as of 07/04/2017.    Past medical history:   Hypertension. Hyperlipidemia. Pre-diabetes. Obesity. Peak weight was 235 pounds. Now she is 208 pounds. History of kidney stones. Sleep apnea. Uses CPAP. Striatum migraine. Anxiety. Appendectomy 1978. She had one  ovary removed 7 years ago. Multiple laparoscopies for endometriosis. She had hysterectomy with removal of remaining ovary in 1986. Nasal surgery for DNS and 2003. Cholecystectomy in 2005. Anterior and posterior repair with sling in 2010. She has screening colonoscopy in 2011 and [place]. She states she had few  small polyps. Repeat advised in 10 years. Robotic-assisted partial right nephrectomy for renal cell carcinoma on 05/17/2017.   Allergies:  Allergies  Allergen Reactions  . Iodinated Diagnostic Agents Anaphylaxis  . Isovue [Iopamidol] Itching    And hives   . Nitrofurantoin Hives  . Sulfa Antibiotics Hives  . Adhesive [Tape] Rash and Other (See Comments)    Paper tape ONLY  . Macrobid [Nitrofurantoin Monohyd Macro] Rash  . Mango Flavor Swelling and Rash    Tongue swelling, feels like throat is closing in on her     Family history:  Mother died at age 67 of cholangiocarcinoma within 4 months of diagnosis. She also had diabetes hypertension and COPD. Health status of father unknown as she was adopted. She has 2 brothers and 1 sister and they all have hypertension. Sister also has COPD and back problems.  Social history:  She is widowed. Her husband died of lung carcinoma at age 66. She lost one son 3 days after birth due to congenital abnormalities. She has son age 57 in good health. She does not smoke cigarettes or drink alcohol. She works at [employer].    Physical examination: Blood pressure 112/82, pulse 68, temperature 98.7 F (37.1 C), temperature source Oral, resp. rate 18, height 5\' 3"  (1.6 m), weight 208 lb 8 oz (94.6 kg). Patient is alert and in no acute distress. Conjunctiva is pink. Sclera is nonicteric Oropharyngeal mucosa is normal. No neck masses or thyromegaly noted. Cardiac exam with regular rhythm normal S1 and S2. No murmur or gallop noted. Lungs are clear to auscultation. Abdomen is full. Bowel sounds are normal. No bruit noted. On palpation abdomen is soft. She has mild tenderness midepigastric region as well as below the right costal margin. She also has mild tenderness over right costal margin. No organomegaly or masses. Well-healed small scars from recent robotic kidney surgery. No LE edema or clubbing noted.  Labs/studies Results: Lab data  from 06/10/2017   WBC 7.2, H&H 10.2 and 31.8. Platelet count 164K.  Glucose 94, BUN 8, creatinine 0.66  Serum calcium 9.6.  Serum sodium 143 potassium 3.9, chloride 104 CO2 28. Bilirubin 0.3, AP 108, AST 18, ALT 25, total protein 6.6 and albumin 4.1.  Serum amylase 63.   Lab data from 06/27/2017 (ER visit )  WBC 8.6, H&H 10.8 and 33.8 and platelet count 148K.   bilirubin 0.6, AP 104, AST 23, ALT 20, total protein 7.4 and albumin 3.9.   serum lipase 26.  Ultrasound from 06/17/2017  Reveals absent gallbladder dilated CBD measuring 10-13 mm but no evidence of gallstones. Fatty liver.  Abdominopelvic CT without contrast from 06/27/2017  Evidence of cholecystectomy., Fatty liver. Mildly dilated CBD and CHD. Postoperative changes along the posterior midpole of right kidney with suture material and stranding. No fluid collection. Punctate stone in upper pole of left kidney and a small cyst.  I have reviewed ultrasound and CT.  Assessment:  #1. Right upper quadrant abdominal pain. This pain started over 2 months ago but has gotten worse over the last 3 weeks. This pain radiates into her back and at times she has chest discomfort. She also complains of regurgitation nausea and early satiety. Workup includes  normal LFTs serum calcium serum amylase and lipase; ultrasound and abdominopelvic CT revealing no abnormality to account for her symptoms.  Her pain and presentation has features of both GI and non-GI etiology. Differential diagnosis includes biliary colic, peptic ulcer disease or she could have neuropathic pain. Peptic ulcer disease can be easily ruled out. If she develops bump in transaminases it was suggests biliary colic. If GI workup is negative would consider MR of lower thoracic spine.   #2. GERD. GERD symptoms are possibly secondary to ongoing symptomatology. She does not have alarm symptoms.   #3. Mild anemia. She developed post op anemia with hemoglobin dropping to 9.1 g  following robotic partial nephrectomy for renal cell carcinoma. Hemoglobin has been gradually coming up and now up to 10.8 g.    Recommendations:  Pantoprazole 40 mg by mouth every morning. Lidoderm patch to be applied to right subcostal costal area where she experiences most of the pain. She will applied 12 hours on 12 hours off. If Lidoderm patch is not covered by her insurance she will try OTC preparation. Patient will keeps daily symptom diary with her next week or two. Diagnostic esophagogastroduodenoscopy near future. Office visit in 4 weeks.

## 2017-07-05 ENCOUNTER — Other Ambulatory Visit (INDEPENDENT_AMBULATORY_CARE_PROVIDER_SITE_OTHER): Payer: Self-pay | Admitting: *Deleted

## 2017-07-05 DIAGNOSIS — R1011 Right upper quadrant pain: Secondary | ICD-10-CM

## 2017-07-05 DIAGNOSIS — K219 Gastro-esophageal reflux disease without esophagitis: Secondary | ICD-10-CM | POA: Insufficient documentation

## 2017-07-08 ENCOUNTER — Other Ambulatory Visit (HOSPITAL_COMMUNITY): Payer: Self-pay | Admitting: Internal Medicine

## 2017-07-08 ENCOUNTER — Ambulatory Visit (HOSPITAL_COMMUNITY)
Admission: RE | Admit: 2017-07-08 | Discharge: 2017-07-08 | Disposition: A | Discharge status: Home / Self Care | Payer: 59 | Source: Ambulatory Visit | Attending: Internal Medicine | Admitting: Internal Medicine

## 2017-07-08 ENCOUNTER — Encounter (HOSPITAL_COMMUNITY): Payer: Self-pay | Admitting: *Deleted

## 2017-07-08 ENCOUNTER — Encounter (HOSPITAL_COMMUNITY)
Admission: RE | Disposition: A | Discharge status: Home / Self Care | Payer: Self-pay | Source: Ambulatory Visit | Attending: Internal Medicine

## 2017-07-08 DIAGNOSIS — Z8249 Family history of ischemic heart disease and other diseases of the circulatory system: Secondary | ICD-10-CM | POA: Insufficient documentation

## 2017-07-08 DIAGNOSIS — Z888 Allergy status to other drugs, medicaments and biological substances status: Secondary | ICD-10-CM | POA: Diagnosis not present

## 2017-07-08 DIAGNOSIS — I10 Essential (primary) hypertension: Secondary | ICD-10-CM | POA: Insufficient documentation

## 2017-07-08 DIAGNOSIS — Z833 Family history of diabetes mellitus: Secondary | ICD-10-CM | POA: Diagnosis not present

## 2017-07-08 DIAGNOSIS — Z882 Allergy status to sulfonamides status: Secondary | ICD-10-CM | POA: Diagnosis not present

## 2017-07-08 DIAGNOSIS — G43809 Other migraine, not intractable, without status migrainosus: Secondary | ICD-10-CM | POA: Diagnosis not present

## 2017-07-08 DIAGNOSIS — Z91041 Radiographic dye allergy status: Secondary | ICD-10-CM | POA: Insufficient documentation

## 2017-07-08 DIAGNOSIS — R1011 Right upper quadrant pain: Secondary | ICD-10-CM | POA: Diagnosis not present

## 2017-07-08 DIAGNOSIS — R110 Nausea: Secondary | ICD-10-CM | POA: Insufficient documentation

## 2017-07-08 DIAGNOSIS — R1013 Epigastric pain: Secondary | ICD-10-CM | POA: Diagnosis not present

## 2017-07-08 DIAGNOSIS — Z79899 Other long term (current) drug therapy: Secondary | ICD-10-CM | POA: Diagnosis not present

## 2017-07-08 DIAGNOSIS — E669 Obesity, unspecified: Secondary | ICD-10-CM | POA: Insufficient documentation

## 2017-07-08 DIAGNOSIS — E785 Hyperlipidemia, unspecified: Secondary | ICD-10-CM | POA: Diagnosis not present

## 2017-07-08 DIAGNOSIS — F419 Anxiety disorder, unspecified: Secondary | ICD-10-CM | POA: Diagnosis not present

## 2017-07-08 DIAGNOSIS — Z90710 Acquired absence of both cervix and uterus: Secondary | ICD-10-CM | POA: Insufficient documentation

## 2017-07-08 DIAGNOSIS — R6881 Early satiety: Secondary | ICD-10-CM | POA: Diagnosis not present

## 2017-07-08 DIAGNOSIS — Z825 Family history of asthma and other chronic lower respiratory diseases: Secondary | ICD-10-CM | POA: Diagnosis not present

## 2017-07-08 DIAGNOSIS — K219 Gastro-esophageal reflux disease without esophagitis: Secondary | ICD-10-CM

## 2017-07-08 DIAGNOSIS — G4730 Sleep apnea, unspecified: Secondary | ICD-10-CM | POA: Diagnosis not present

## 2017-07-08 DIAGNOSIS — Z9049 Acquired absence of other specified parts of digestive tract: Secondary | ICD-10-CM | POA: Diagnosis not present

## 2017-07-08 DIAGNOSIS — Z9102 Food additives allergy status: Secondary | ICD-10-CM | POA: Diagnosis not present

## 2017-07-08 DIAGNOSIS — R7303 Prediabetes: Secondary | ICD-10-CM | POA: Insufficient documentation

## 2017-07-08 DIAGNOSIS — Z85528 Personal history of other malignant neoplasm of kidney: Secondary | ICD-10-CM | POA: Insufficient documentation

## 2017-07-08 DIAGNOSIS — Z1231 Encounter for screening mammogram for malignant neoplasm of breast: Secondary | ICD-10-CM

## 2017-07-08 DIAGNOSIS — K2970 Gastritis, unspecified, without bleeding: Secondary | ICD-10-CM | POA: Diagnosis not present

## 2017-07-08 DIAGNOSIS — Z6836 Body mass index (BMI) 36.0-36.9, adult: Secondary | ICD-10-CM | POA: Insufficient documentation

## 2017-07-08 DIAGNOSIS — Z905 Acquired absence of kidney: Secondary | ICD-10-CM | POA: Diagnosis not present

## 2017-07-08 HISTORY — PX: ESOPHAGOGASTRODUODENOSCOPY: SHX5428

## 2017-07-08 HISTORY — PX: BIOPSY: SHX5522

## 2017-07-08 SURGERY — EGD (ESOPHAGOGASTRODUODENOSCOPY)
Anesthesia: Moderate Sedation

## 2017-07-08 MED ORDER — MEPERIDINE HCL 50 MG/ML IJ SOLN
INTRAMUSCULAR | Status: AC
  Filled 2017-07-08: qty 1

## 2017-07-08 MED ORDER — LIDOCAINE VISCOUS 2 % MT SOLN
OROMUCOSAL | Status: AC
  Filled 2017-07-08: qty 15

## 2017-07-08 MED ORDER — MIDAZOLAM HCL 5 MG/5ML IJ SOLN
INTRAMUSCULAR | Status: AC
  Filled 2017-07-08: qty 10

## 2017-07-08 MED ORDER — STERILE WATER FOR IRRIGATION IR SOLN
Status: DC | PRN
  Administered 2017-07-08: 14:00:00

## 2017-07-08 MED ORDER — LIDOCAINE VISCOUS 2 % MT SOLN
OROMUCOSAL | Status: DC | PRN
  Administered 2017-07-08: 4 mL via OROMUCOSAL

## 2017-07-08 MED ORDER — SODIUM CHLORIDE 0.9 % IV SOLN
INTRAVENOUS | Status: DC
  Administered 2017-07-08: 14:00:00 via INTRAVENOUS

## 2017-07-08 MED ORDER — MIDAZOLAM HCL 5 MG/5ML IJ SOLN
INTRAMUSCULAR | Status: DC | PRN
  Administered 2017-07-08 (×3): 2 mg via INTRAVENOUS
  Administered 2017-07-08: 1 mg via INTRAVENOUS

## 2017-07-08 MED ORDER — MEPERIDINE HCL 50 MG/ML IJ SOLN
INTRAMUSCULAR | Status: DC | PRN
  Administered 2017-07-08 (×2): 25 mg via INTRAVENOUS

## 2017-07-08 NOTE — Interval H&P Note (Signed)
History and Physical Interval Note:  07/08/2017 2:18 PM  Rebecca Scott  has presented today for surgery, with the diagnosis of Right upper quadrant pain  The various methods of treatment have been discussed with the patient and family. After consideration of risks, benefits and other options for treatment, the patient has consented to  Procedure(s) with comments: ESOPHAGOGASTRODUODENOSCOPY (EGD) (N/A) - 220 as a surgical intervention .  The patient's history has been reviewed, patient examined, no change in status, stable for surgery.  I have reviewed the patient's chart and labs.  Questions were answered to the patient's satisfaction.    She thought she felt better over the weekend but the pain is back today.  She is not using Lidoderm as it was not approved yet.   [employer]

## 2017-07-08 NOTE — H&P (View-Only) (Signed)
Reason for consultatio.;  Right upper quadrant abdominal pain and nausea.  History of present illness:  Rebecca Scott 61-year-old Caucasian female who is referred through courtesy of Dr. Willey Blade for GI evaluation. She is accompanied by her friend Ellery Plunk. She began to have right upper quadrant/subcostal pain in August 2018. This pain was sporadic and associated with nausea. She felt as if she had pressure in this area and would last from a few minutes to several hours. Back in June 2018 she was evaluated in emergency room for dysuria and hematuria and felt to have diverticulitis and treated with Cipro and Flagyl. Unenhanced CT revealed right renal lesion which was further evaluated with ultrasound and MR and was felt to be renal cell carcinoma. She states she had robotic surgery on 05/17/2017. She spent 3 days in the hospital because of low heart rate and hypotension. She had some postop pain which has gradually resolved. About 3 weeks ago she began to have pain and right upper quadrant and would last almost all day. She felt as if she has baby's foot stuck in the rib cage and other times she would feel as if there is softball and right upper quadrant. She has noted intermittent retrosternal pain. She also has had low-grade fever. She's had nausea but no vomiting. This pain has been radiating into her back. At times his pain is sharp and intense. She had one episode like this on 06/27/2017 when she was seen in emergency room and had lab studies including CBC comprehensive chemistry panel lipase and troponin level. Lipase and troponin levels were normal. LFTs were also normal. She was discharged in dicyclomine which she took for a few days and did not see any benefit and therefore stopped this medication. She feels worse. She has been burping frequently. She also complains of regurgitation heartburn and has early satiety. However she has not lost any weight. Her bowels been irregular since she had kidney  surgery. She may have couple stools on a given day and then may skip for few days. She denies melena or rectal bleeding. There is no history of fall or blunt injury to her abdomen. She also denies back problems.    Current Medications: Outpatient Encounter Prescriptions as of 07/04/2017  Medication Sig  . clonazePAM (KLONOPIN) 0.5 MG tablet Take 0.5 mg by mouth 2 (two) times daily as needed for anxiety.  Marland Kitchen losartan-hydrochlorothiazide (HYZAAR) 100-25 MG tablet Take 1 tablet by mouth daily.  . ondansetron (ZOFRAN ODT) 4 MG disintegrating tablet 4mg  ODT q4 hours prn nausea/vomit  . rosuvastatin (CRESTOR) 5 MG tablet Take 5 mg by mouth daily.   Marland Kitchen dicyclomine (BENTYL) 20 MG tablet Take 1 tablet (20 mg total) by mouth every 6 (six) hours as needed for spasms (abdominal cramping). (Patient not taking: Reported on 07/04/2017)  . HYDROcodone-acetaminophen (NORCO/VICODIN) 5-325 MG tablet Take 1-2 tablets by mouth every 6 (six) hours as needed for moderate pain. (Patient not taking: Reported on 07/04/2017)  . [DISCONTINUED] esomeprazole (NEXIUM) 40 MG capsule Take 40 mg by mouth daily as needed (for acid reflux.).   . [DISCONTINUED] HYDROcodone-acetaminophen (NORCO/VICODIN) 5-325 MG tablet 1 or 2 tabs PO q6 hours prn pain (Patient not taking: Reported on 07/04/2017)   No facility-administered encounter medications on file as of 07/04/2017.    Past medical history:   Hypertension. Hyperlipidemia. Pre-diabetes. Obesity. Peak weight was 235 pounds. Now she is 208 pounds. History of kidney stones. Sleep apnea. Uses CPAP. Striatum migraine. Anxiety. Appendectomy 1978. She had one  ovary removed 7 years ago. Multiple laparoscopies for endometriosis. She had hysterectomy with removal of remaining ovary in 1986. Nasal surgery for DNS and 2003. Cholecystectomy in 2005. Anterior and posterior repair with sling in 2010. She has screening colonoscopy in 2011 and [place]. She states she had few  small polyps. Repeat advised in 10 years. Robotic-assisted partial right nephrectomy for renal cell carcinoma on 05/17/2017.   Allergies:  Allergies  Allergen Reactions  . Iodinated Diagnostic Agents Anaphylaxis  . Isovue [Iopamidol] Itching    And hives   . Nitrofurantoin Hives  . Sulfa Antibiotics Hives  . Adhesive [Tape] Rash and Other (See Comments)    Paper tape ONLY  . Macrobid [Nitrofurantoin Monohyd Macro] Rash  . Mango Flavor Swelling and Rash    Tongue swelling, feels like throat is closing in on her     Family history:  Mother died at age 13 of cholangiocarcinoma within 4 months of diagnosis. She also had diabetes hypertension and COPD. Health status of father unknown as she was adopted. She has 2 brothers and 1 sister and they all have hypertension. Sister also has COPD and back problems.  Social history:  She is widowed. Her husband died of lung carcinoma at age 65. She lost one son 3 days after birth due to congenital abnormalities. She has son age 75 in good health. She does not smoke cigarettes or drink alcohol. She works at [employer].    Physical examination: Blood pressure 112/82, pulse 68, temperature 98.7 F (37.1 C), temperature source Oral, resp. rate 18, height 5\' 3"  (1.6 m), weight 208 lb 8 oz (94.6 kg). Patient is alert and in no acute distress. Conjunctiva is pink. Sclera is nonicteric Oropharyngeal mucosa is normal. No neck masses or thyromegaly noted. Cardiac exam with regular rhythm normal S1 and S2. No murmur or gallop noted. Lungs are clear to auscultation. Abdomen is full. Bowel sounds are normal. No bruit noted. On palpation abdomen is soft. She has mild tenderness midepigastric region as well as below the right costal margin. She also has mild tenderness over right costal margin. No organomegaly or masses. Well-healed small scars from recent robotic kidney surgery. No LE edema or clubbing noted.  Labs/studies Results: Lab data  from 06/10/2017   WBC 7.2, H&H 10.2 and 31.8. Platelet count 164K.  Glucose 94, BUN 8, creatinine 0.66  Serum calcium 9.6.  Serum sodium 143 potassium 3.9, chloride 104 CO2 28. Bilirubin 0.3, AP 108, AST 18, ALT 25, total protein 6.6 and albumin 4.1.  Serum amylase 63.   Lab data from 06/27/2017 (ER visit )  WBC 8.6, H&H 10.8 and 33.8 and platelet count 148K.   bilirubin 0.6, AP 104, AST 23, ALT 20, total protein 7.4 and albumin 3.9.   serum lipase 26.  Ultrasound from 06/17/2017  Reveals absent gallbladder dilated CBD measuring 10-13 mm but no evidence of gallstones. Fatty liver.  Abdominopelvic CT without contrast from 06/27/2017  Evidence of cholecystectomy., Fatty liver. Mildly dilated CBD and CHD. Postoperative changes along the posterior midpole of right kidney with suture material and stranding. No fluid collection. Punctate stone in upper pole of left kidney and a small cyst.  I have reviewed ultrasound and CT.  Assessment:  #1. Right upper quadrant abdominal pain. This pain started over 2 months ago but has gotten worse over the last 3 weeks. This pain radiates into her back and at times she has chest discomfort. She also complains of regurgitation nausea and early satiety. Workup includes  normal LFTs serum calcium serum amylase and lipase; ultrasound and abdominopelvic CT revealing no abnormality to account for her symptoms.  Her pain and presentation has features of both GI and non-GI etiology. Differential diagnosis includes biliary colic, peptic ulcer disease or she could have neuropathic pain. Peptic ulcer disease can be easily ruled out. If she develops bump in transaminases it was suggests biliary colic. If GI workup is negative would consider MR of lower thoracic spine.   #2. GERD. GERD symptoms are possibly secondary to ongoing symptomatology. She does not have alarm symptoms.   #3. Mild anemia. She developed post op anemia with hemoglobin dropping to 9.1 g  following robotic partial nephrectomy for renal cell carcinoma. Hemoglobin has been gradually coming up and now up to 10.8 g.    Recommendations:  Pantoprazole 40 mg by mouth every morning. Lidoderm patch to be applied to right subcostal costal area where she experiences most of the pain. She will applied 12 hours on 12 hours off. If Lidoderm patch is not covered by her insurance she will try OTC preparation. Patient will keeps daily symptom diary with her next week or two. Diagnostic esophagogastroduodenoscopy near future. Office visit in 4 weeks.

## 2017-07-08 NOTE — Discharge Instructions (Signed)
Resume aspirin on 07/10/2017. Resume medications and diet as before. No driving for 24 hours. Physician will call with biopsy results and further recommendations.   Upper Endoscopy, Care After Refer to this sheet in the next few weeks. These instructions provide you with information about caring for yourself after your procedure. Your health care provider may also give you more specific instructions. Your treatment has been planned according to current medical practices, but problems sometimes occur. Call your health care provider if you have any problems or questions after your procedure. What can I expect after the procedure? After the procedure, it is common to have:  A sore throat.  Bloating.  Nausea.  Follow these instructions at home:  Follow instructions from your health care provider about what to eat or drink after your procedure.  Return to your normal activities as told by your health care provider. Ask your health care provider what activities are safe for you.  Take over-the-counter and prescription medicines only as told by your health care provider.  Do not drive for 24 hours if you received a sedative.  Keep all follow-up visits as told by your health care provider. This is important. Contact a health care provider if:  You have a sore throat that lasts longer than one day.  You have trouble swallowing. Get help right away if:  You have a fever.  You vomit blood or your vomit looks like coffee grounds.  You have bloody, black, or tarry stools.  You have a severe sore throat or you cannot swallow.  You have difficulty breathing.  You have severe pain in your chest or belly. This information is not intended to replace advice given to you by your health care provider. Make sure you discuss any questions you have with your health care provider. Document Released: 02/26/2012 Document Revised: 02/02/2016 Document Reviewed: 06/09/2015 Elsevier Interactive  Patient Education  2017 [employer].

## 2017-07-08 NOTE — Op Note (Signed)
[facility] Patient Name: Rebecca Scott Procedure Date: 07/08/2017 2:05 PM MRN: [id number] Date of Birth: June 14, 1956 Attending MD: Hildred Laser , MD CSN: [id number] Age: 61 Admit Type: Outpatient Procedure:                Upper GI endoscopy Indications:              Epigastric abdominal pain, Abdominal pain in the                            right upper quadrant Providers:                Hildred Laser, MD, Otis Peak B. Sharon Seller, RN, Janeece Riggers, RN Referring MD:             Asencion Noble, MD Medicines:                Lidocaine spray, Meperidine 50 mg IV, Midazolam 7                            mg IV Complications:            No immediate complications. Estimated Blood Loss:     Estimated blood loss was minimal. Procedure:                Pre-Anesthesia Assessment:                           - Prior to the procedure, a History and Physical                            was performed, and patient medications and                            allergies were reviewed. The patient's tolerance of                            previous anesthesia was also reviewed. The risks                            and benefits of the procedure and the sedation                            options and risks were discussed with the patient.                            All questions were answered, and informed consent                            was obtained. Prior Anticoagulants: The patient                            last took aspirin 4 days prior to the procedure.  ASA Grade Assessment: II - A patient with mild                            systemic disease. After reviewing the risks and                            benefits, the patient was deemed in satisfactory                            condition to undergo the procedure.                           After obtaining informed consent, the endoscope was                            passed under direct vision. Throughout the                             procedure, the patient's blood pressure, pulse, and                            oxygen saturations were monitored continuously. The                            [id number] ([id number]) scope was introduced through the                            mouth, and advanced to the third part of duodenum.                            The upper GI endoscopy was accomplished without                            difficulty. The patient tolerated the procedure                            well. Scope In: 2:28:48 PM Scope Out: 2:36:56 PM Total Procedure Duration: 0 hours 8 minutes 8 seconds  Findings:      The examined esophagus was normal.      The Z-line was regular and was found 39 cm from the incisors.      Patchy mild inflammation characterized by congestion (edema) and       erythema was found in the gastric antrum. Biopsies were taken with a       cold forceps for histology.      The exam of the stomach was otherwise normal.      The duodenal bulb, second portion of the duodenum and third portion of       the duodenum were normal. Impression:               - Normal esophagus.                           - Z-line regular, 39 cm from the incisors.                           -  Gastritis. Biopsied.                           - Normal duodenal bulb, second portion of the                            duodenum and third portion of the duodenum.                           Comment: This finding would not explain patien't                            symptoms. Moderate Sedation:      Moderate (conscious) sedation was administered by the endoscopy nurse       and supervised by the endoscopist. The following parameters were       monitored: oxygen saturation, heart rate, blood pressure, CO2       capnography and response to care. Total physician intraservice time was       15 minutes. Recommendation:           - Patient has a contact number available for                            emergencies. The  signs and symptoms of potential                            delayed complications were discussed with the                            patient. Return to normal activities tomorrow.                            Written discharge instructions were provided to the                            patient.                           - Resume previous diet today.                           - Continue present medications.                           - No aspirin, ibuprofen, naproxen, or other                            non-steroidal anti-inflammatory drugs for 2 days.                           - Await pathology results. Procedure Code(s):        --- Professional ---                           [phone], Esophagogastroduodenoscopy, flexible,  transoral; with biopsy, single or multiple                           99152, Moderate sedation services provided by the                            same physician or other qualified health care                            professional performing the diagnostic or                            therapeutic service that the sedation supports,                            requiring the presence of an independent trained                            observer to assist in the monitoring of the                            patient's level of consciousness and physiological                            status; initial 15 minutes of intraservice time,                            patient age 91 years or older Diagnosis Code(s):        --- Professional ---                           K29.70, Gastritis, unspecified, without bleeding                           R10.13, Epigastric pain                           R10.11, Right upper quadrant pain CPT copyright 2016 [employer]. All rights reserved. The codes documented in this report are preliminary and upon coder review may  be revised to meet current compliance requirements. Hildred Laser, MD Hildred Laser,  MD 07/08/2017 2:49:01 PM This report has been signed electronically. Number of Addenda: 0

## 2017-07-11 ENCOUNTER — Encounter (HOSPITAL_COMMUNITY): Payer: Self-pay | Admitting: Internal Medicine

## 2017-07-11 ENCOUNTER — Encounter (INDEPENDENT_AMBULATORY_CARE_PROVIDER_SITE_OTHER): Payer: Self-pay | Admitting: Internal Medicine

## 2017-07-12 ENCOUNTER — Other Ambulatory Visit (HOSPITAL_COMMUNITY)
Admission: RE | Admit: 2017-07-12 | Discharge: 2017-07-12 | Disposition: A | Discharge status: Home / Self Care | Payer: 59 | Source: Ambulatory Visit | Attending: Internal Medicine | Admitting: Internal Medicine

## 2017-07-12 ENCOUNTER — Other Ambulatory Visit (INDEPENDENT_AMBULATORY_CARE_PROVIDER_SITE_OTHER): Payer: Self-pay | Admitting: *Deleted

## 2017-07-12 ENCOUNTER — Other Ambulatory Visit (INDEPENDENT_AMBULATORY_CARE_PROVIDER_SITE_OTHER): Payer: Self-pay | Admitting: Internal Medicine

## 2017-07-12 ENCOUNTER — Encounter (INDEPENDENT_AMBULATORY_CARE_PROVIDER_SITE_OTHER): Payer: Self-pay

## 2017-07-12 DIAGNOSIS — R1011 Right upper quadrant pain: Secondary | ICD-10-CM

## 2017-07-12 LAB — HEPATIC FUNCTION PANEL
ALK PHOS: 94 U/L (ref 38–126)
ALT: 26 U/L (ref 14–54)
AST: 25 U/L (ref 15–41)
Albumin: 3.8 g/dL (ref 3.5–5.0)
Total Bilirubin: 0.6 mg/dL (ref 0.3–1.2)
Total Protein: 6.7 g/dL (ref 6.5–8.1)

## 2017-07-17 ENCOUNTER — Ambulatory Visit (INDEPENDENT_AMBULATORY_CARE_PROVIDER_SITE_OTHER): Payer: 59 | Admitting: Internal Medicine

## 2017-07-17 ENCOUNTER — Telehealth (INDEPENDENT_AMBULATORY_CARE_PROVIDER_SITE_OTHER): Payer: Self-pay | Admitting: *Deleted

## 2017-07-17 ENCOUNTER — Ambulatory Visit (HOSPITAL_COMMUNITY)
Admission: RE | Admit: 2017-07-17 | Discharge: 2017-07-17 | Disposition: A | Discharge status: Home / Self Care | Payer: 59 | Source: Ambulatory Visit | Attending: Internal Medicine | Admitting: Internal Medicine

## 2017-07-17 DIAGNOSIS — E079 Disorder of thyroid, unspecified: Secondary | ICD-10-CM | POA: Insufficient documentation

## 2017-07-17 DIAGNOSIS — R1011 Right upper quadrant pain: Secondary | ICD-10-CM | POA: Diagnosis present

## 2017-07-17 NOTE — Telephone Encounter (Signed)
Rec'd additional information from [facility] regarding the Lidoderm patches. I called and redid the PA and this medication was approved. Both the patient's pharmacy and patient was made aware.  Reference # [id number] Approved till 01/14/2017.

## 2017-07-19 ENCOUNTER — Other Ambulatory Visit (INDEPENDENT_AMBULATORY_CARE_PROVIDER_SITE_OTHER): Payer: Self-pay | Admitting: Internal Medicine

## 2017-07-19 DIAGNOSIS — G8929 Other chronic pain: Secondary | ICD-10-CM

## 2017-07-19 DIAGNOSIS — E079 Disorder of thyroid, unspecified: Secondary | ICD-10-CM

## 2017-07-19 DIAGNOSIS — M549 Dorsalgia, unspecified: Principal | ICD-10-CM

## 2017-07-19 DIAGNOSIS — R937 Abnormal findings on diagnostic imaging of other parts of musculoskeletal system: Secondary | ICD-10-CM

## 2017-07-19 DIAGNOSIS — E0789 Other specified disorders of thyroid: Secondary | ICD-10-CM

## 2017-07-22 ENCOUNTER — Other Ambulatory Visit (INDEPENDENT_AMBULATORY_CARE_PROVIDER_SITE_OTHER): Payer: Self-pay | Admitting: Internal Medicine

## 2017-07-24 ENCOUNTER — Ambulatory Visit (HOSPITAL_COMMUNITY)
Admission: RE | Admit: 2017-07-24 | Discharge: 2017-07-24 | Disposition: A | Discharge status: Home / Self Care | Payer: 59 | Source: Ambulatory Visit | Attending: Internal Medicine | Admitting: Internal Medicine

## 2017-07-24 DIAGNOSIS — E0789 Other specified disorders of thyroid: Secondary | ICD-10-CM | POA: Diagnosis present

## 2017-07-24 DIAGNOSIS — E042 Nontoxic multinodular goiter: Secondary | ICD-10-CM | POA: Insufficient documentation

## 2017-07-24 DIAGNOSIS — E079 Disorder of thyroid, unspecified: Secondary | ICD-10-CM

## 2017-07-30 ENCOUNTER — Encounter (INDEPENDENT_AMBULATORY_CARE_PROVIDER_SITE_OTHER): Payer: Self-pay | Admitting: Internal Medicine

## 2017-07-30 ENCOUNTER — Ambulatory Visit (INDEPENDENT_AMBULATORY_CARE_PROVIDER_SITE_OTHER): Payer: 59 | Admitting: Internal Medicine

## 2017-07-30 VITALS — BP 112/76 | HR 68 | Temp 98.7°F | Resp 18 | Ht 63.0 in | Wt 209.4 lb

## 2017-07-30 DIAGNOSIS — K219 Gastro-esophageal reflux disease without esophagitis: Secondary | ICD-10-CM

## 2017-07-30 DIAGNOSIS — R1011 Right upper quadrant pain: Secondary | ICD-10-CM | POA: Diagnosis not present

## 2017-07-30 DIAGNOSIS — E042 Nontoxic multinodular goiter: Secondary | ICD-10-CM

## 2017-07-30 DIAGNOSIS — R112 Nausea with vomiting, unspecified: Secondary | ICD-10-CM | POA: Diagnosis not present

## 2017-07-30 MED ORDER — FAMOTIDINE 20 MG PO TABS: 20.0000 mg | ORAL_TABLET | Freq: Every day | ORAL | Status: DC

## 2017-07-30 MED ORDER — ONDANSETRON 4 MG PO TBDP: 4.0000 mg | ORAL_TABLET | Freq: Three times a day (TID) | ORAL | 0 refills | Status: DC | PRN

## 2017-07-30 NOTE — Patient Instructions (Addendum)
Resume regular brisk walking or exercise 3-4 times a week as discussed. Further follow-up of thyroid nodules per Dr. Willey Blade. Bone scan to be scheduled when approved.

## 2017-07-30 NOTE — Progress Notes (Signed)
Presenting complaint;  Follow-up for right upper quadrant abdominal pain and GERD.  Database and subjective:  Patient is 61 year old Caucasian female who was last seen on 07/04/2017 for 19-month history of right upper quadrant abdominal pain.  She had normal LFTs serum calcium amylase lipase ultrasound and CT.  She was also having GERD symptoms.  Patient was begun on pantoprazole.  I felt her pain could be referred from her back.  Therefore MR of thoracic spine was obtained.  It reveals broad disc bulging between T11 and 12 without nerve impingement.  Study also showed multiple hemangiomas and thyroid nodules.  Thyroid ultrasound was obtained revealing multiple nodules felt to be benign and follow-up exam recommended in 1 year. She was also prescribed Lidoderm. She had LFTs after an episode of intense pain these were normal.  She returns for reevaluation today.  She does not feel any better.  She remains with intermittent sharp pain under the right rib cage.  She had an episode Saturday and took a pain pill.  She has been using Lidoderm patch for the last 5-6 days and it may have helped slightly.  She describes her pain to be something pushing or twisting.  She seemed to get better when she lies flat and stretches.  She remains with intermittent nausea.  She is having less heartburn but is not completely gone away.  Her appetite is fair.  She has not lost any weight.  She did vomit once a few weeks ago after lunch.  She did not experience hematemesis.  Her bowels are regular.  Every now and then she has loose stool.  She did take dicyclomine given to her by Dr. Willey Blade but it did not make much difference.  She Has food and symptom diary which was reviewed with the patient.  She has been under a lot of stress.  She lost her aunts recently because of complicated pancreatitis.   Current Medications: Outpatient Encounter Medications as of 07/30/2017  Medication Sig  . aspirin EC 81 MG tablet Take 1 tablet  (81 mg total) by mouth daily.  . clonazePAM (KLONOPIN) 0.5 MG tablet Take 0.5 mg by mouth 2 (two) times daily as needed for anxiety.  Marland Kitchen HYDROcodone-acetaminophen (NORCO/VICODIN) 5-325 MG tablet Take 1-2 tablets by mouth every 6 (six) hours as needed for moderate pain.  Marland Kitchen lidocaine (LIDODERM) 5 % Place 1 patch onto the skin daily. Remove & Discard patch within 12 hours or as directed by MD  . losartan-hydrochlorothiazide (HYZAAR) 100-25 MG tablet Take 1 tablet by mouth daily.  . ondansetron (ZOFRAN ODT) 4 MG disintegrating tablet 4mg  ODT q4 hours prn nausea/vomit  . pantoprazole (PROTONIX) 40 MG tablet Take 1 tablet (40 mg total) by mouth daily before breakfast.  . rosuvastatin (CRESTOR) 5 MG tablet Take 5 mg by mouth daily.   . [DISCONTINUED] Lidocaine 4 % LOTN Apply 1 application topically daily as needed (pain).   No facility-administered encounter medications on file as of 07/30/2017.      Objective: Blood pressure 112/76, pulse 68, temperature 98.7 F (37.1 C), temperature source Oral, resp. rate 18, height 5\' 3"  (1.6 m), weight 209 lb 6.4 oz (95 kg). Patient is alert and in no acute distress. Conjunctiva is pink. Sclera is nonicteric Oropharyngeal mucosa is normal. No neck masses or thyromegaly noted. Cardiac exam with regular rhythm normal S1 and S2. No murmur or gallop noted. Lungs are clear to auscultation. Abdomen is full.  On palpation abdomen is soft.  She remains with mild  tenderness below the right costal margin on deep palpation.  No organomegaly or masses noted. No LE edema or clubbing noted.  Labs/studies Results:  MRI THORACIC SPINE WITHOUT CONTRAST  TECHNIQUE: Multiplanar, multisequence MR imaging of the thoracic spine was performed. No intravenous contrast was administered.  COMPARISON:  None.  FINDINGS: Alignment:  Physiologic.  Vertebrae: No fracture, evidence of discitis, or aggressive bone lesion. T1 and T2 hyperintense bone lesion in the T3, T9, T10  and T11 vertebral bodies most consistent with hemangiomas. T1 hypointense and T2 hyperintense 11 mm bone lesion in the posterior aspect of the T6 vertebral body with stippled low signal foci on the T2 weighted images suggesting an atypical hemangioma.  Cord:  Normal signal and morphology.  Paraspinal and other soft tissues: No focal paraspinal abnormality. 2 x 2.2 cm T2 hyperintense right thyroid mass of uncertain etiology. 13 mm T2 hyperintense left thyroid mass.  Disc levels:  Disc spaces:  Disc spaces are relatively well maintained.  T1-T2: No significant disc protrusion, foraminal stenosis or central canal stenosis.  T2-T3: No significant disc protrusion, foraminal stenosis or central canal stenosis.  T3-T4: No significant disc protrusion, foraminal stenosis or central canal stenosis.  T4-T5: No significant disc protrusion, foraminal stenosis or central canal stenosis.  T5-T6: No significant disc protrusion, foraminal stenosis or central canal stenosis.  T6-T7: No significant disc protrusion, foraminal stenosis or central canal stenosis.  T7-T8: No significant disc protrusion, foraminal stenosis or central canal stenosis.  T8-T9: No significant disc protrusion, foraminal stenosis or central canal stenosis.  T9-T10: No significant disc protrusion, foraminal stenosis or central canal stenosis.  T10-T11: No significant disc protrusion, foraminal stenosis or central canal stenosis.  T11-T12: Mild broad-based disc bulge. No foraminal or central canal stenosis.  IMPRESSION: 1. No acute osseous abnormality of the thoracic spine. 2. No significant thoracic spine disc protrusion, foraminal stenosis or central canal stenosis. 3. 2 x 2.2 cm T2 hyperintense right thyroid mass of uncertain etiology. 13 mm T2 hyperintense left thyroid mass. Recommend further evaluation with a dedicated thyroid ultrasound.   Electronically Signed   By: Kathreen Devoid    On: 07/17/2017 15:48  LFTs from 07/12/2017(following an episode of intense pain).  Bilirubin 0.6, AP 94, AST 25, ALT 26, total protein 6.7 and albumin 3.8.  Thyroid ultrasound.  THYROID ULTRASOUND  TECHNIQUE: Ultrasound examination of the thyroid gland and adjacent soft tissues was performed.  COMPARISON:  07/17/2017  FINDINGS: Parenchymal Echotexture: Moderately heterogenous  Isthmus: 3 mm  Right lobe: 5.0 x 2.4 x 2.3 cm  Left lobe: 4.3 x 1.5 x 1.5 cm  _________________________________________________________  Estimated total number of nodules >/= 1 cm: 2  Number of spongiform nodules >/=  2 cm not described below (TR1): 0  Number of mixed cystic and solid nodules >/= 1.5 cm not described below ([place]): 0  _________________________________________________________  Nodule # 3:  Location: Right; Inferior  Maximum size: 2.4 cm; Other 2 dimensions: 2.2 x 2.2 cm  Composition: solid/almost completely solid (2)  Echogenicity: isoechoic (1)  Shape: not taller-than-wide (0)  Margins: smooth (0)  Echogenic foci: none (0)  ACR TI-RADS total points: 3.  ACR TI-RADS risk category: TR3 (3 points).  ACR TI-RADS recommendations:  *Given size (>/= 1.5 - 2.4 cm) and appearance, a follow-up ultrasound in 1 year should be considered based on TI-RADS criteria.  _________________________________________________________  Nodule # 4:  Location: Left; Superior  Maximum size: 1.5 cm; Other 2 dimensions: 1.3 x 1.2 cm  Composition: solid/almost completely solid (  2)  Echogenicity: isoechoic (1)  Shape: not taller-than-wide (0)  Margins: smooth (0)  Echogenic foci: none (0)  ACR TI-RADS total points: 3.  ACR TI-RADS risk category: TR3 (3 points).  ACR TI-RADS recommendations:  *Given size (>/= 1.5 - 2.4 cm) and appearance, a follow-up ultrasound in 1 year should be considered based on TI-RADS  criteria.  _________________________________________________________  There are additional small subcentimeter hypoechoic cystic nodules in the right lobe noted. These are not fully described by TI RADS criteria but would not meet criteria for any biopsy or follow-up.  IMPRESSION: 2.4 cm right inferior TR 3 nodule and 1.5 cm left superior TR 3 nodule, both meet criteria for follow-up in 1 year. Currently no biopsy is indicated.  The above is in keeping with the ACR TI-RADS recommendations - J Am Coll Radiol 2017;14:587-595.   Electronically Signed   By: Jerilynn Mages.  Shick M.D.   On: 07/24/2017 14:19  Assessment:  #1.  Right upper quadrant abdominal pain.  She remains with pain.  She has had few episodes where she had to take hydrocodone for relief.  MRI of thoracic spine did not reveal the abnormality to account for her pain.  She does have mild broad-based disc bulge at T11/T12 without nerve impingement.  I wonder if her pain is caused by more than one mechanism.  Bone scan has not yet been approved.  #2.  GERD.  Symptoms are not well controlled with single dose of PPI and dietary measures.  Recent EGD was negative for erosive esophagitis.  #3.  Nausea with sporadic vomiting.  Recent EGD was negative for peptic ulcer disease.  Gastric biopsy was negative for H. pylori infection.  If nausea and vomiting continue may have to we will out gastroparesis.  #4.  Multiple thyroid nodules.  These were discovered on MRI of thoracic spine and subsequently followed up with thyroid ultrasound.  Dr. Annamaria Boots recommended follow-up ultrasound in 1 year but will let Dr. Willey Blade make the final determination.   Plan:  Ondansetron prescription refilled. Pepcid OTC 20 mg p.o. Nightly. Patient advised to begin brisk walking or exercise 3-4 times a week. Bone scan pending.  It has not yet been approved by her insurance. Office visit in 2 months.

## 2017-07-31 ENCOUNTER — Ambulatory Visit: Payer: 59 | Admitting: Nurse Practitioner

## 2017-08-05 ENCOUNTER — Encounter (INDEPENDENT_AMBULATORY_CARE_PROVIDER_SITE_OTHER): Payer: Self-pay | Admitting: Internal Medicine

## 2017-08-07 ENCOUNTER — Encounter (HOSPITAL_COMMUNITY)
Admission: RE | Admit: 2017-08-07 | Discharge: 2017-08-07 | Disposition: A | Discharge status: Home / Self Care | Payer: 59 | Source: Ambulatory Visit | Attending: Internal Medicine | Admitting: Internal Medicine

## 2017-08-07 DIAGNOSIS — R937 Abnormal findings on diagnostic imaging of other parts of musculoskeletal system: Secondary | ICD-10-CM

## 2017-08-07 DIAGNOSIS — G8929 Other chronic pain: Secondary | ICD-10-CM

## 2017-08-07 DIAGNOSIS — M549 Dorsalgia, unspecified: Secondary | ICD-10-CM | POA: Diagnosis present

## 2017-08-07 MED ORDER — TECHNETIUM TC 99M MEDRONATE IV KIT
25.0000 | PACK | Freq: Once | INTRAVENOUS | Status: AC | PRN
  Administered 2017-08-07: 19.8 via INTRAVENOUS

## 2017-08-08 ENCOUNTER — Ambulatory Visit (INDEPENDENT_AMBULATORY_CARE_PROVIDER_SITE_OTHER): Payer: 59 | Admitting: Internal Medicine

## 2017-08-11 ENCOUNTER — Other Ambulatory Visit: Payer: Self-pay

## 2017-08-11 ENCOUNTER — Emergency Department (HOSPITAL_COMMUNITY)
Admission: EM | Admit: 2017-08-11 | Discharge: 2017-08-11 | Disposition: A | Discharge status: Home / Self Care | Payer: 59 | Attending: Emergency Medicine | Admitting: Emergency Medicine

## 2017-08-11 ENCOUNTER — Encounter (HOSPITAL_COMMUNITY): Payer: Self-pay | Admitting: Emergency Medicine

## 2017-08-11 ENCOUNTER — Emergency Department (HOSPITAL_COMMUNITY): Payer: 59

## 2017-08-11 DIAGNOSIS — Z79899 Other long term (current) drug therapy: Secondary | ICD-10-CM | POA: Insufficient documentation

## 2017-08-11 DIAGNOSIS — N189 Chronic kidney disease, unspecified: Secondary | ICD-10-CM | POA: Diagnosis not present

## 2017-08-11 DIAGNOSIS — R55 Syncope and collapse: Secondary | ICD-10-CM

## 2017-08-11 DIAGNOSIS — I129 Hypertensive chronic kidney disease with stage 1 through stage 4 chronic kidney disease, or unspecified chronic kidney disease: Secondary | ICD-10-CM | POA: Insufficient documentation

## 2017-08-11 DIAGNOSIS — Z7982 Long term (current) use of aspirin: Secondary | ICD-10-CM | POA: Diagnosis not present

## 2017-08-11 LAB — CBC WITH DIFFERENTIAL/PLATELET
BASOS ABS: 0 10*3/uL (ref 0.0–0.1)
Basophils Relative: 1 %
EOS PCT: 5 %
Eosinophils Absolute: 0.3 10*3/uL (ref 0.0–0.7)
HCT: 35.4 % — ABNORMAL LOW (ref 36.0–46.0)
HEMOGLOBIN: 10.8 g/dL — AB (ref 12.0–15.0)
LYMPHS ABS: 1.7 10*3/uL (ref 0.7–4.0)
LYMPHS PCT: 34 %
MCH: 27.3 pg (ref 26.0–34.0)
MCHC: 30.5 g/dL (ref 30.0–36.0)
MCV: 89.6 fL (ref 78.0–100.0)
Monocytes Absolute: 0.5 10*3/uL (ref 0.1–1.0)
Monocytes Relative: 10 %
NEUTROS ABS: 2.6 10*3/uL (ref 1.7–7.7)
NEUTROS PCT: 50 %
PLATELETS: 160 10*3/uL (ref 150–400)
RBC: 3.95 MIL/uL (ref 3.87–5.11)
RDW: 15.7 % — ABNORMAL HIGH (ref 11.5–15.5)
WBC: 5.1 10*3/uL (ref 4.0–10.5)

## 2017-08-11 LAB — COMPREHENSIVE METABOLIC PANEL
ALK PHOS: 108 U/L (ref 38–126)
ALT: 25 U/L (ref 14–54)
AST: 25 U/L (ref 15–41)
Albumin: 3.9 g/dL (ref 3.5–5.0)
Anion gap: 5 (ref 5–15)
BUN: 11 mg/dL (ref 6–20)
CALCIUM: 9.5 mg/dL (ref 8.9–10.3)
CHLORIDE: 109 mmol/L (ref 101–111)
CO2: 29 mmol/L (ref 22–32)
CREATININE: 0.68 mg/dL (ref 0.44–1.00)
Glucose, Bld: 101 mg/dL — ABNORMAL HIGH (ref 65–99)
Potassium: 3.4 mmol/L — ABNORMAL LOW (ref 3.5–5.1)
SODIUM: 143 mmol/L (ref 135–145)
Total Bilirubin: 0.6 mg/dL (ref 0.3–1.2)
Total Protein: 7.4 g/dL (ref 6.5–8.1)

## 2017-08-11 LAB — CBG MONITORING, ED: GLUCOSE-CAPILLARY: 99 mg/dL (ref 65–99)

## 2017-08-11 LAB — TSH: TSH: 1.464 u[IU]/mL (ref 0.350–4.500)

## 2017-08-11 LAB — T4, FREE: Free T4: 0.68 ng/dL (ref 0.61–1.12)

## 2017-08-11 MED ORDER — SODIUM CHLORIDE 0.9 % IV BOLUS (SEPSIS)
500.0000 mL | Freq: Once | INTRAVENOUS | Status: AC
  Administered 2017-08-11: 500 mL via INTRAVENOUS

## 2017-08-11 MED ORDER — SODIUM CHLORIDE 0.9 % IV SOLN
INTRAVENOUS | Status: DC
  Administered 2017-08-11: 125 mL/h via INTRAVENOUS

## 2017-08-11 NOTE — ED Triage Notes (Signed)
Pt reports singing in church and having a syncopal episode.  States she did not fall and pretty much just sat back down in the pew.  This has been occurring for several months with vertigo.

## 2017-08-11 NOTE — Discharge Instructions (Signed)
Follow-up with your primary care doctor this coming week to be rechecked.  Return to the emergency room as needed for worsening symptoms.  Make sure to drink plenty of fluids and stay hydrated

## 2017-08-11 NOTE — ED Notes (Signed)
Pt reports persistent pain at right ribs at right upper quad of abdomen.  States this has been present x 4 months and she is concerned about what it could be.  States she was called with her bone scan results, but she did not understand what was explained on the phone.  Reports she has had syncope like this before when under a lot of stress with her husband's cancer and her mother's illness and death in 23-Oct-2022.  The dx of kidney cancer has been especially hard on her and she does not know what to do.  Noted pt has not been referred to oncology and no tx plan is in place.

## 2017-08-11 NOTE — ED Provider Notes (Signed)
[facility] EMERGENCY DEPARTMENT Provider Note   CSN: [id number] Arrival date & time: 08/11/17  1230   History   Chief Complaint Chief Complaint  Patient presents with  . Loss of Consciousness    HPI Rebecca Scott is a 61 y.o. female.  HPI Patient presents to the emergency room for evaluation of a syncopal episode.  Patient states she was at church today.  The patient was singing at church when she started to feel lightheaded.  Apparently patient sat back down in the pew and did not fall.  Patient thinks she may have lost consciousness for a few seconds.  Patient feels very weak now.  She denies any trouble with chest pain or shortness of breath.  She denies any trouble with headache.  No vomiting or diarrhea.  No focal numbness or weakness just generalized weakness.  Patient does have a history of having constant pain in the right upper quadrant for several months now.  She is not sure what is causing it.  She has seen Dr. Laural Golden.  As part of her evaluation she recently a bone scan.  Patient also states she had a thoracic MRI and thyroid nodules were noted as part of that.  She was told that she just needed to get repeat imaging in 1 year but is planning on seeing someone regarding the thyroid nodules.  She denies having any blood testing Past Medical History:  Diagnosis Date  . Anemia   . Asthma    seasonal asthma rarely   . Chronic kidney disease    renal mass right kidney   . CPAP (continuous positive airway pressure) dependence   . Diverticulitis   . GERD (gastroesophageal reflux disease)   . Hemorrhoids   . Hyperlipemia   . Hypertension   . Migraine   . PONV (postoperative nausea and vomiting)    patient reports being "slower to wake than average "   . Pre-diabetes    "ive been told i'm borderline"   . Sleep apnea    CPAP use     Patient Active Problem List   Diagnosis Date Noted  . Abdominal pain, right upper quadrant 07/05/2017  . Gastroesophageal reflux  disease 07/05/2017  . Renal mass 05/17/2017  . HTN (hypertension) 07/29/2014  . Hyperlipidemia 07/29/2014  . Syncope 07/29/2014    Past Surgical History:  Procedure Laterality Date  . ABDOMINAL HYSTERECTOMY    . APPENDECTOMY  1978  . BIOPSY  07/08/2017   Procedure: BIOPSY;  Surgeon: Rogene Houston, MD;  Location: [facility]ITE;  Service: Endoscopy;;  gastric  . CHOLECYSTECTOMY  2005  . ESOPHAGOGASTRODUODENOSCOPY N/A 07/08/2017   Procedure: ESOPHAGOGASTRODUODENOSCOPY (EGD);  Surgeon: Rogene Houston, MD;  Location: [facility];  Service: Endoscopy;  Laterality: N/A;  220  . NASAL SEPTUM SURGERY    . ROBOTIC ASSITED PARTIAL NEPHRECTOMY Right 05/17/2017   Procedure: XI ROBOTIC ASSITED PARTIAL NEPHRECTOMY;  Surgeon: Alexis Frock, MD;  Location: [facility] ORS;  Service: Urology;  Laterality: Right;    OB History    Gravida Para Term Preterm AB Living             1   SAB TAB Ectopic Multiple Live Births                   Home Medications    Prior to Admission medications   Medication Sig Start Date End Date Taking? Authorizing Provider  aspirin EC 81 MG tablet Take 1 tablet (81 mg total)  by mouth daily. 07/10/17   Rehman, Mechele Dawley, MD  clonazePAM (KLONOPIN) 0.5 MG tablet Take 0.5 mg by mouth 2 (two) times daily as needed for anxiety.    [provider]  famotidine (PEPCID) 20 MG tablet Take 1 tablet (20 mg total) at bedtime by mouth. 07/30/17   Rehman, Mechele Dawley, MD  HYDROcodone-acetaminophen (NORCO/VICODIN) 5-325 MG tablet Take 1-2 tablets by mouth every 6 (six) hours as needed for moderate pain. 05/17/17   Debbrah Alar, PA-C  lidocaine (LIDODERM) 5 % Place 1 patch onto the skin daily. Remove & Discard patch within 12 hours or as directed by MD 07/04/17   Rogene Houston, MD  losartan-hydrochlorothiazide (HYZAAR) 100-25 MG tablet Take 1 tablet by mouth daily. 01/15/17   [provider]  ondansetron (ZOFRAN ODT) 4 MG disintegrating tablet Take 1 tablet (4 mg total)  every 8 (eight) hours as needed by mouth for nausea or vomiting. 4mg  ODT q4 hours prn nausea/vomit 07/30/17   Rehman, Mechele Dawley, MD  pantoprazole (PROTONIX) 40 MG tablet Take 1 tablet (40 mg total) by mouth daily before breakfast. 07/04/17   Rehman, Mechele Dawley, MD  rosuvastatin (CRESTOR) 5 MG tablet Take 5 mg by mouth daily.  08/18/15   [provider]    Family History Family History  Problem Relation Age of Onset  . COPD Mother   . Diabetes Mother   . Hypertension Mother   . High Cholesterol Mother   . COPD Sister   . Hypertension Brother   . High Cholesterol Brother   . Hypertension Brother   . Asthma Son     Social History Social History   Tobacco Use  . Smoking status: Never Smoker  . Smokeless tobacco: Never Used  Substance Use Topics  . Alcohol use: Yes    Comment: rarely   . Drug use: No     Allergies   Iodinated diagnostic agents; Isovue [iopamidol]; Nitrofurantoin; Sulfa antibiotics; Adhesive [tape]; Macrobid [nitrofurantoin monohyd macro]; and Regulatory affairs officer   Review of Systems Review of Systems  All other systems reviewed and are negative.    Physical Exam Updated Vital Signs Ht 1.6 m (5\' 3" )   Wt 94.8 kg (209 lb)   BMI 37.02 kg/m   Physical Exam  Constitutional: She appears well-developed and well-nourished. No distress.  HENT:  Head: Normocephalic and atraumatic.  Right Ear: External ear normal.  Left Ear: External ear normal.  Eyes: Conjunctivae are normal. Right eye exhibits no discharge. Left eye exhibits no discharge. No scleral icterus.  Neck: Neck supple. No tracheal deviation present.  Cardiovascular: Normal rate, regular rhythm and intact distal pulses.  Pulmonary/Chest: Effort normal and breath sounds normal. No stridor. No respiratory distress. She has no wheezes. She has no rales.  Abdominal: Soft. Bowel sounds are normal. She exhibits no distension. There is no tenderness. There is no rebound and no guarding.  Musculoskeletal:  She exhibits no edema or tenderness.  Neurological: She is alert. She has normal strength. No cranial nerve deficit (no facial droop, extraocular movements intact, no slurred speech) or sensory deficit. She exhibits normal muscle tone. She displays no seizure activity. Coordination normal.  Skin: Skin is warm and dry. No rash noted.  Psychiatric: She has a normal mood and affect.  Nursing note and vitals reviewed.    ED Treatments / Results  Labs (all labs ordered are listed, but only abnormal results are displayed) Labs Reviewed  CBC WITH DIFFERENTIAL/PLATELET  COMPREHENSIVE METABOLIC PANEL  TSH  T4,  FREE  CBG MONITORING, ED    EKG  EKG Interpretation  Date/Time:  Sunday August 11 2017 12:43:25 EST Ventricular Rate:  60 PR Interval:    QRS Duration: 99 QT Interval:  498 QTC Calculation: 498 R Axis:   33 Text Interpretation:  Sinus rhythm Borderline repolarization abnormality Borderline prolonged QT interval No significant change since last tracing Confirmed by Dorie Rank [phone]) on 08/11/2017 12:55:28 PM       Radiology No results found.  Outpatient Bone scan IMPRESSION: Scattered degenerative type uptake as above. Questionable foci of abnormal tracer localization versus artifacts at the posterior RIGHT seventh and eighth ribs ; these lesions are doubtful, consider radiographic correlation. Otherwise negative exam. Electronically Signed   By: Lavonia Dana M.D.   On: 08/07/2017 17:46  Outpatient thyroid US result IMPRESSION: 2.4 cm right inferior TR 3 nodule and 1.5 cm left superior TR 3 nodule, both meet criteria for follow-up in 1 year. Currently no biopsy is indicated.  The above is in keeping with the ACR TI-RADS recommendations - J Am Coll Radiol 2017;14:587-595.   Electronically Signed   By: Jerilynn Mages.  Shick M.D.   On: 07/24/2017 14:19 Procedures Procedures (including critical care time)  Medications Ordered in ED Medications  sodium chloride 0.9  % bolus 500 mL (not administered)    And  0.9 %  sodium chloride infusion (not administered)     Initial Impression / Assessment and Plan / ED Course  I have reviewed the triage vital signs and the nursing notes.  Pertinent labs & imaging results that were available during my care of the patient were reviewed by me and considered in my medical decision making (see chart for details).   Pt presented to the ED after a syncopal episode.  Pt states she has had episodes in the past.  ED workup is reassuring.  She does not have a history of heart failure coronary artery disease.  Overall low risk for any severe cardiac dysrhythmia.  I think it is reasonable for the patient to follow-up with her primary care doctor to discuss further outpatient testing.  Patient also mentions some concerns with her incidental thyroid nodules and chronic right-sided abdominal pain.  No acute abnormalities noted with that today.  I did add on thyroid testing and rib x-rays because of the abnormalities noted on her recent outpatient workup.  No acute findings noted  Final Clinical Impressions(s) / ED Diagnoses   Final diagnoses:  None    ED Discharge Orders    None       Dorie Rank, MD 08/11/17 1539

## 2017-08-21 DIAGNOSIS — E042 Nontoxic multinodular goiter: Secondary | ICD-10-CM | POA: Insufficient documentation

## 2017-08-21 DIAGNOSIS — R7303 Prediabetes: Secondary | ICD-10-CM | POA: Insufficient documentation

## 2017-08-21 HISTORY — DX: Nontoxic multinodular goiter: E04.2

## 2017-08-26 ENCOUNTER — Ambulatory Visit (HOSPITAL_COMMUNITY)
Admission: RE | Admit: 2017-08-26 | Discharge: 2017-08-26 | Disposition: A | Discharge status: Home / Self Care | Payer: 59 | Source: Ambulatory Visit | Attending: Internal Medicine | Admitting: Internal Medicine

## 2017-08-26 ENCOUNTER — Encounter (HOSPITAL_COMMUNITY): Payer: Self-pay

## 2017-08-26 DIAGNOSIS — Z1231 Encounter for screening mammogram for malignant neoplasm of breast: Secondary | ICD-10-CM | POA: Diagnosis present

## 2017-09-16 ENCOUNTER — Ambulatory Visit (HOSPITAL_COMMUNITY)
Admission: RE | Admit: 2017-09-16 | Discharge: 2017-09-16 | Disposition: A | Discharge status: Home / Self Care | Payer: 59 | Source: Ambulatory Visit | Attending: Internal Medicine | Admitting: Internal Medicine

## 2017-09-16 DIAGNOSIS — R55 Syncope and collapse: Secondary | ICD-10-CM

## 2017-09-16 DIAGNOSIS — I493 Ventricular premature depolarization: Secondary | ICD-10-CM | POA: Insufficient documentation

## 2017-10-03 ENCOUNTER — Ambulatory Visit (INDEPENDENT_AMBULATORY_CARE_PROVIDER_SITE_OTHER): Payer: 59 | Admitting: Internal Medicine

## 2017-10-03 ENCOUNTER — Encounter (INDEPENDENT_AMBULATORY_CARE_PROVIDER_SITE_OTHER): Payer: Self-pay | Admitting: Internal Medicine

## 2017-10-03 VITALS — BP 102/62 | HR 66 | Temp 98.2°F | Resp 18 | Ht 63.0 in | Wt 213.1 lb

## 2017-10-03 DIAGNOSIS — R1011 Right upper quadrant pain: Secondary | ICD-10-CM | POA: Diagnosis not present

## 2017-10-03 DIAGNOSIS — R194 Change in bowel habit: Secondary | ICD-10-CM | POA: Diagnosis not present

## 2017-10-03 NOTE — Progress Notes (Signed)
Presenting complaint;  Follow-up for right upper quadrant abdominal pain and GERD.  Database and Subjective:  Patient is 62 year old Caucasian female who was initially seen on 07/04/2017 for right upper quadrant abdominal pain which started sometime in August 2018 but got worse after robotic surgery for small right renal cell carcinoma.  Initial evaluation included normal LFTs amylase lipase serum calcium ultrasound and abdominal pelvic CT.Marland Kitchen She also had typical symptoms of GERD.  She was begun on Pantoprazole and given prescription for Lidoderm.  She underwent diagnostic esophagogastroduodenoscopy. EGD revealed gastritis and H. pylori serology was negative. MR of thoracic spine was obtained revealing multiple hemangiomas and thyroid nodules along with bulging disc between T11 and T12.  There was no evidence of nerve impingement. Thyroid ultrasound suggested multinodular goiter. Bone scan revealed vague increased uptake involving right seventh and eighth ribs but rib films were negative. Patient was given pain medication to be used on as-needed basis. Patient requested consultation at [facility] where she was seen by Dr. Odette Fraction of hematology oncology.  She was begun on gabapentin.  She had CBC comprehensive chemistry panel and LDH and these are unremarkable except mild anemia with hemoglobin of 11.1 g which is chronic.  Has been referred to pain clinic.  Also seeing an endocrinologist at [facility].  Patient does not feel any better.  She says Lidoderm helps with the back pain but not the pain in the right rib cage.  She seemed to have worse pain when she has been sitting for more than an hour and a half.  She stands and stretches it seemed to help.  She describes pain to be intense at times.  She is taking hydrocodone with acetaminophen on as-needed basis.  She usually takes 1 or 2 doses a week but 1 day she had to take 3 pills.  Denies if there is a big bulging mass there.  Yesterday she had  chili beans and develop pain on the right rib cage.  She believes this is a different kind of pain. She remains concerned as to the cause of this pain.  Her biggest fear is that she may have metastatic disease.  She is having difficult time coping with her symptoms. Heartburn is well controlled with therapy.  She does not have regurgitation.  She remains with irregular bowel movements.  She either has constipation or diarrhea.  For the last several weeks she has been passing pencil thin stools.  Last colonoscopy was 7 years ago. Says family history is positive for inflammatory bowel disease.  Her maternal aunt has ulcerative colitis and maternal grandmother had Crohn's disease. Patient states gabapentin has helped her sleep better at night but so far it does not help with pain.   Current Medications: Outpatient Encounter Medications as of 10/03/2017  Medication Sig  . aspirin EC 81 MG tablet Take 1 tablet (81 mg total) by mouth daily.  . clonazePAM (KLONOPIN) 0.5 MG tablet Take 0.5 mg by mouth 2 (two) times daily as needed for anxiety.  . gabapentin (NEURONTIN) 100 MG capsule Take 100 mg by mouth 3 (three) times daily.  Marland Kitchen HYDROcodone-acetaminophen (NORCO/VICODIN) 5-325 MG tablet Take 1-2 tablets by mouth every 6 (six) hours as needed for moderate pain.  Marland Kitchen lidocaine (LIDODERM) 5 % Place 1 patch onto the skin daily. Remove & Discard patch within 12 hours or as directed by MD  . losartan-hydrochlorothiazide (HYZAAR) 100-25 MG tablet Take 1 tablet by mouth daily.  . ondansetron (ZOFRAN ODT) 4 MG disintegrating tablet  Take 1 tablet (4 mg total) every 8 (eight) hours as needed by mouth for nausea or vomiting. 4mg  ODT q4 hours prn nausea/vomit  . pantoprazole (PROTONIX) 40 MG tablet Take 1 tablet (40 mg total) by mouth daily before breakfast. (Patient taking differently: Take 40 mg by mouth daily. )  . potassium chloride SA (K-DUR,KLOR-CON) 20 MEQ tablet Take 20 mEq by mouth daily.   . rosuvastatin  (CRESTOR) 5 MG tablet Take 5 mg by mouth daily.   . famotidine (PEPCID) 20 MG tablet Take 1 tablet (20 mg total) at bedtime by mouth. (Patient not taking: Reported on 10/03/2017)  . predniSONE (DELTASONE) 50 MG tablet Take 50 mg by mouth. Patient is to take prior to kidney test on 10/04/2017.   No facility-administered encounter medications on file as of 10/03/2017.      Objective: Blood pressure 102/62, pulse 66, temperature 98.2 F (36.8 C), temperature source Oral, resp. rate 18, height 5\' 3"  (1.6 m), weight 213 lb 1.6 oz (96.7 kg). Patient is alert and in no acute distress. Conjunctiva is pink. Sclera is nonicteric Oropharyngeal mucosa is normal. No neck masses or thyromegaly noted. Cardiac exam with regular rhythm normal S1 and S2. No murmur or gallop noted. Lungs are clear to auscultation. Abdomen is full.  Bowel sounds are normal.  On palpation abdomen is soft.  She has mild vague tenderness below the costal margin.  No organomegaly or masses. No LE edema or clubbing noted.  Labs/studies Results: Lab data from 09/26/2017 C5 0.9.  H&H 11.6 and 33.6.  Platelet count 158K.   Glucose 117 BUN 14 and creatinine 0.75. Bilirubin 0.4, AP 106, AST 19, ALT 26 and albumin 3.8. Serum calcium 9.4.  Assessment:  #1.  Right upper quadrant abdominal pain this duration.  She had this pain prior to renal surgery for pain got worse after she had robotic partial right nephrectomy.  She has extensive workup as outlined in database.  Workup has been negative.  This pain appears to be musculoskeletal or neuropathic pain.  She may also have pain due to dyspepsia which appears to be different pain and triggered by meals.  EGD 3 months ago was negative for peptic ulcer disease or gastritis. Since patient is having difficult time coping with her symptoms it is very appropriate for her to be seen at the pain clinic so that her pain can be managed more effectively.  #2.  GERD.  Doing well with therapy.  #3.   Change in bowel habits.  She has irregular bowel habits and most likely has IBS but now she is having pencil thin stools and this abnormality appears to be fixed.  Therefore colonic neoplasms/stricture needs to be ruled out.  Index of suspicion is low.   Plan:  Famotidine removed from the list as she is not taking this medication. Pantoprazole at 40 mg p.o. every morning. Diagnostic.  Colonoscopy to be scheduled in the near future. Patient encouraged to begin physical activity gradually as tolerated. Office visit in 6 months.

## 2017-10-03 NOTE — Patient Instructions (Signed)
Colonoscopy to be scheduled. 

## 2017-10-07 ENCOUNTER — Telehealth (INDEPENDENT_AMBULATORY_CARE_PROVIDER_SITE_OTHER): Payer: Self-pay | Admitting: *Deleted

## 2017-10-07 ENCOUNTER — Encounter (INDEPENDENT_AMBULATORY_CARE_PROVIDER_SITE_OTHER): Payer: Self-pay | Admitting: *Deleted

## 2017-10-07 ENCOUNTER — Other Ambulatory Visit (INDEPENDENT_AMBULATORY_CARE_PROVIDER_SITE_OTHER): Payer: Self-pay | Admitting: Internal Medicine

## 2017-10-07 DIAGNOSIS — R1011 Right upper quadrant pain: Secondary | ICD-10-CM

## 2017-10-07 DIAGNOSIS — R194 Change in bowel habit: Secondary | ICD-10-CM

## 2017-10-07 MED ORDER — PEG 3350-KCL-NA BICARB-NACL 420 G PO SOLR: 4000.0000 mL | Freq: Once | ORAL | 0 refills | Status: AC

## 2017-10-07 NOTE — Telephone Encounter (Signed)
Patient needs trilyte 

## 2017-10-22 DIAGNOSIS — F329 Major depressive disorder, single episode, unspecified: Secondary | ICD-10-CM | POA: Insufficient documentation

## 2017-10-22 NOTE — Patient Instructions (Addendum)
Rebecca Scott  10/22/2017     @PREFPERIOPPHARMACY @   Your procedure is scheduled on  10/28/2017   Report to [facility] at  615   A.M.  Call this number if you have problems the morning of surgery:  [phone]   Remember:  Do not eat food or drink liquids after midnight.  Take these medicines the morning of surgery with A SIP OF WATER  Klonopin, neurontin, hydrocodone, claritin, zofran, protonix.   Do not wear jewelry, make-up or nail polish.  Do not wear lotions, powders, or perfumes, or deodorant.  Do not shave 48 hours prior to surgery.  Men may shave face and neck.  Do not bring valuables to the hospital.  [facility] is not responsible for any belongings or valuables.  Contacts, dentures or bridgework may not be worn into surgery.  Leave your suitcase in the car.  After surgery it may be brought to your room.  For patients admitted to the hospital, discharge time will be determined by your treatment team.  Patients discharged the day of surgery will not be allowed to drive home.   Name and phone number of your driver:   family Special instructions:  Follow the diet and prep instructions given to you by Dr Olevia Perches office.  Please read over the following fact sheets that you were given. Anesthesia Post-op Instructions and Care and Recovery After Surgery       Colonoscopy, Adult A colonoscopy is an exam to look at the large intestine. It is done to check for problems, such as:  Lumps (tumors).  Growths (polyps).  Swelling (inflammation).  Bleeding.  What happens before the procedure? Eating and drinking Follow instructions from your doctor about eating and drinking. These instructions may include:  A few days before the procedure - follow a low-fiber diet. ? Avoid nuts. ? Avoid seeds. ? Avoid dried fruit. ? Avoid raw fruits. ? Avoid vegetables.  1-3 days before the procedure - follow a clear liquid diet. Avoid liquids that have red or  purple dye. Drink only clear liquids, such as: ? Clear broth or bouillon. ? Black coffee or tea. ? Clear juice. ? Clear soft drinks or sports drinks. ? Gelatin dessert. ? Popsicles.  On the day of the procedure - do not eat or drink anything during the 2 hours before the procedure.  Bowel prep If you were prescribed an oral bowel prep:  Take it as told by your doctor. Starting the day before your procedure, you will need to drink a lot of liquid. The liquid will cause you to poop (have bowel movements) until your poop is almost clear or light green.  If your skin or butt gets irritated from diarrhea, you may: ? Wipe the area with wipes that have medicine in them, such as adult wet wipes with aloe and vitamin E. ? Put something on your skin that soothes the area, such as petroleum jelly.  If you throw up (vomit) while drinking the bowel prep, take a break for up to 60 minutes. Then begin the bowel prep again. If you keep throwing up and you cannot take the bowel prep without throwing up, call your doctor.  General instructions  Ask your doctor about changing or stopping your normal medicines. This is important if you take diabetes medicines or blood thinners.  Plan to have someone take you home from the hospital or clinic. What happens during the procedure?  An IV tube may be put into one of your veins.  You will be given medicine to help you relax (sedative).  To reduce your risk of infection: ? Your doctors will wash their hands. ? Your anal area will be washed with soap.  You will be asked to lie on your side with your knees bent.  Your doctor will get a long, thin, flexible tube ready. The tube will have a camera and a light on the end.  The tube will be put into your anus.  The tube will be gently put into your large intestine.  Air will be delivered into your large intestine to keep it open. You may feel some pressure or cramping.  The camera will be used to take  photos.  A small tissue sample may be removed from your body to be looked at under a microscope (biopsy). If any possible problems are found, the tissue will be sent to a lab for testing.  If small growths are found, your doctor may remove them and have them checked for cancer.  The tube that was put into your anus will be slowly removed. The procedure may vary among doctors and hospitals. What happens after the procedure?  Your doctor will check on you often until the medicines you were given have worn off.  Do not drive for 24 hours after the procedure.  You may have a small amount of blood in your poop.  You may pass gas.  You may have mild cramps or bloating in your belly (abdomen).  It is up to you to get the results of your procedure. Ask your doctor, or the department performing the procedure, when your results will be ready. This information is not intended to replace advice given to you by your health care provider. Make sure you discuss any questions you have with your health care provider. Document Released: 09/29/2010 Document Revised: 06/27/2016 Document Reviewed: 11/08/2015 Elsevier Interactive Patient Education  2017 [employer]  Colonoscopy, Adult, Care After This sheet gives you information about how to care for yourself after your procedure. Your health care provider may also give you more specific instructions. If you have problems or questions, contact your health care provider. What can I expect after the procedure? After the procedure, it is common to have:  A small amount of blood in your stool for 24 hours after the procedure.  Some gas.  Mild abdominal cramping or bloating.  Follow these instructions at home: General instructions   For the first 24 hours after the procedure: ? Do not drive or use machinery. ? Do not sign important documents. ? Do not drink alcohol. ? Do your regular daily activities at a slower pace than normal. ? Eat soft,  easy-to-digest foods. ? Rest often.  Take over-the-counter or prescription medicines only as told by your health care provider.  It is up to you to get the results of your procedure. Ask your health care provider, or the department performing the procedure, when your results will be ready. Relieving cramping and bloating  Try walking around when you have cramps or feel bloated.  Apply heat to your abdomen as told by your health care provider. Use a heat source that your health care provider recommends, such as a moist heat pack or a heating pad. ? Place a towel between your skin and the heat source. ? Leave the heat on for 20-30 minutes. ? Remove the heat if your skin turns bright red. This is  especially important if you are unable to feel pain, heat, or cold. You may have a greater risk of getting burned. Eating and drinking  Drink enough fluid to keep your urine clear or pale yellow.  Resume your normal diet as instructed by your health care provider. Avoid heavy or fried foods that are hard to digest.  Avoid drinking alcohol for as long as instructed by your health care provider. Contact a health care provider if:  You have blood in your stool 2-3 days after the procedure. Get help right away if:  You have more than a small spotting of blood in your stool.  You pass large blood clots in your stool.  Your abdomen is swollen.  You have nausea or vomiting.  You have a fever.  You have increasing abdominal pain that is not relieved with medicine. This information is not intended to replace advice given to you by your health care provider. Make sure you discuss any questions you have with your health care provider. Document Released: 04/10/2004 Document Revised: 05/21/2016 Document Reviewed: 11/08/2015 Elsevier Interactive Patient Education  2018 [place] Anesthesia is a term that refers to techniques, procedures, and medicines that help a  person stay safe and comfortable during a medical procedure. Monitored anesthesia care, or sedation, is one type of anesthesia. Your anesthesia specialist may recommend sedation if you will be having a procedure that does not require you to be unconscious, such as:  Cataract surgery.  A dental procedure.  A biopsy.  A colonoscopy.  During the procedure, you may receive a medicine to help you relax (sedative). There are three levels of sedation:  Mild sedation. At this level, you may feel awake and relaxed. You will be able to follow directions.  Moderate sedation. At this level, you will be sleepy. You may not remember the procedure.  Deep sedation. At this level, you will be asleep. You will not remember the procedure.  The more medicine you are given, the deeper your level of sedation will be. Depending on how you respond to the procedure, the anesthesia specialist may change your level of sedation or the type of anesthesia to fit your needs. An anesthesia specialist will monitor you closely during the procedure. Let your health care provider know about:  Any allergies you have.  All medicines you are taking, including vitamins, herbs, eye drops, creams, and over-the-counter medicines.  Any use of steroids (by mouth or as a cream).  Any problems you or family members have had with sedatives and anesthetic medicines.  Any blood disorders you have.  Any surgeries you have had.  Any medical conditions you have, such as sleep apnea.  Whether you are pregnant or may be pregnant.  Any use of cigarettes, alcohol, or street drugs. What are the risks? Generally, this is a safe procedure. However, problems may occur, including:  Getting too much medicine (oversedation).  Nausea.  Allergic reaction to medicines.  Trouble breathing. If this happens, a breathing tube may be used to help with breathing. It will be removed when you are awake and breathing on your own.  Heart  trouble.  Lung trouble.  Before the procedure Staying hydrated Follow instructions from your health care provider about hydration, which may include:  Up to 2 hours before the procedure - you may continue to drink clear liquids, such as water, clear fruit juice, black coffee, and plain tea.  Eating and drinking restrictions Follow instructions from your health care provider  about eating and drinking, which may include:  8 hours before the procedure - stop eating heavy meals or foods such as meat, fried foods, or fatty foods.  6 hours before the procedure - stop eating light meals or foods, such as toast or cereal.  6 hours before the procedure - stop drinking milk or drinks that contain milk.  2 hours before the procedure - stop drinking clear liquids.  Medicines Ask your health care provider about:  Changing or stopping your regular medicines. This is especially important if you are taking diabetes medicines or blood thinners.  Taking medicines such as aspirin and ibuprofen. These medicines can thin your blood. Do not take these medicines before your procedure if your health care provider instructs you not to.  Tests and exams  You will have a physical exam.  You may have blood tests done to show: ? How well your kidneys and liver are working. ? How well your blood can clot.  General instructions  Plan to have someone take you home from the hospital or clinic.  If you will be going home right after the procedure, plan to have someone with you for 24 hours.  What happens during the procedure?  Your blood pressure, heart rate, breathing, level of pain and overall condition will be monitored.  An IV tube will be inserted into one of your veins.  Your anesthesia specialist will give you medicines as needed to keep you comfortable during the procedure. This may mean changing the level of sedation.  The procedure will be performed. After the procedure  Your blood  pressure, heart rate, breathing rate, and blood oxygen level will be monitored until the medicines you were given have worn off.  Do not drive for 24 hours if you received a sedative.  You may: ? Feel sleepy, clumsy, or nauseous. ? Feel forgetful about what happened after the procedure. ? Have a sore throat if you had a breathing tube during the procedure. ? Vomit. This information is not intended to replace advice given to you by your health care provider. Make sure you discuss any questions you have with your health care provider. Document Released: 05/23/2005 Document Revised: 02/03/2016 Document Reviewed: 12/18/2015 Elsevier Interactive Patient Education  2018 [place], Care After These instructions provide you with information about caring for yourself after your procedure. Your health care provider may also give you more specific instructions. Your treatment has been planned according to current medical practices, but problems sometimes occur. Call your health care provider if you have any problems or questions after your procedure. What can I expect after the procedure? After your procedure, it is common to:  Feel sleepy for several hours.  Feel clumsy and have poor balance for several hours.  Feel forgetful about what happened after the procedure.  Have poor judgment for several hours.  Feel nauseous or vomit.  Have a sore throat if you had a breathing tube during the procedure.  Follow these instructions at home: For at least 24 hours after the procedure:   Do not: ? Participate in activities in which you could fall or become injured. ? Drive. ? Use heavy machinery. ? Drink alcohol. ? Take sleeping pills or medicines that cause drowsiness. ? Make important decisions or sign legal documents. ? Take care of children on your own.  Rest. Eating and drinking  Follow the diet that is recommended by your health care provider.  If you  vomit, drink water, juice,  or soup when you can drink without vomiting.  Make sure you have little or no nausea before eating solid foods. General instructions  Have a responsible adult stay with you until you are awake and alert.  Take over-the-counter and prescription medicines only as told by your health care provider.  If you smoke, do not smoke without supervision.  Keep all follow-up visits as told by your health care provider. This is important. Contact a health care provider if:  You keep feeling nauseous or you keep vomiting.  You feel light-headed.  You develop a rash.  You have a fever. Get help right away if:  You have trouble breathing. This information is not intended to replace advice given to you by your health care provider. Make sure you discuss any questions you have with your health care provider. Document Released: 12/18/2015 Document Revised: 04/18/2016 Document Reviewed: 12/18/2015 Elsevier Interactive Patient Education  2018 [place], Care After These instructions provide you with information about caring for yourself after your procedure. Your health care provider may also give you more specific instructions. Your treatment has been planned according to current medical practices, but problems sometimes occur. Call your health care provider if you have any problems or questions after your procedure. What can I expect after the procedure? After your procedure, it is common to:  Feel sleepy for several hours.  Feel clumsy and have poor balance for several hours.  Feel forgetful about what happened after the procedure.  Have poor judgment for several hours.  Feel nauseous or vomit.  Have a sore throat if you had a breathing tube during the procedure.  Follow these instructions at home: For at least 24 hours after the procedure:   Do not: ? Participate in activities in which you could fall or become  injured. ? Drive. ? Use heavy machinery. ? Drink alcohol. ? Take sleeping pills or medicines that cause drowsiness. ? Make important decisions or sign legal documents. ? Take care of children on your own.  Rest. Eating and drinking  Follow the diet that is recommended by your health care provider.  If you vomit, drink water, juice, or soup when you can drink without vomiting.  Make sure you have little or no nausea before eating solid foods. General instructions  Have a responsible adult stay with you until you are awake and alert.  Take over-the-counter and prescription medicines only as told by your health care provider.  If you smoke, do not smoke without supervision.  Keep all follow-up visits as told by your health care provider. This is important. Contact a health care provider if:  You keep feeling nauseous or you keep vomiting.  You feel light-headed.  You develop a rash.  You have a fever. Get help right away if:  You have trouble breathing. This information is not intended to replace advice given to you by your health care provider. Make sure you discuss any questions you have with your health care provider. Document Released: 12/18/2015 Document Revised: 04/18/2016 Document Reviewed: 12/18/2015 Elsevier Interactive Patient Education  [employer].

## 2017-10-23 ENCOUNTER — Encounter (HOSPITAL_COMMUNITY)
Admission: RE | Admit: 2017-10-23 | Discharge: 2017-10-23 | Disposition: A | Discharge status: Home / Self Care | Payer: 59 | Source: Ambulatory Visit | Attending: Internal Medicine | Admitting: Internal Medicine

## 2017-10-23 ENCOUNTER — Encounter (HOSPITAL_COMMUNITY): Payer: Self-pay

## 2017-10-28 ENCOUNTER — Ambulatory Visit (HOSPITAL_COMMUNITY)
Admission: RE | Admit: 2017-10-28 | Discharge: 2017-10-28 | Disposition: A | Discharge status: Home Health | Payer: 59 | Source: Ambulatory Visit | Attending: Internal Medicine | Admitting: Internal Medicine

## 2017-10-28 ENCOUNTER — Ambulatory Visit (HOSPITAL_COMMUNITY): Payer: 59 | Admitting: Anesthesiology

## 2017-10-28 ENCOUNTER — Encounter (HOSPITAL_COMMUNITY)
Admission: RE | Disposition: A | Discharge status: Home Health | Payer: Self-pay | Source: Ambulatory Visit | Attending: Internal Medicine

## 2017-10-28 DIAGNOSIS — Z79899 Other long term (current) drug therapy: Secondary | ICD-10-CM | POA: Insufficient documentation

## 2017-10-28 DIAGNOSIS — Z882 Allergy status to sulfonamides status: Secondary | ICD-10-CM | POA: Insufficient documentation

## 2017-10-28 DIAGNOSIS — Z881 Allergy status to other antibiotic agents status: Secondary | ICD-10-CM | POA: Diagnosis not present

## 2017-10-28 DIAGNOSIS — Z85528 Personal history of other malignant neoplasm of kidney: Secondary | ICD-10-CM | POA: Diagnosis not present

## 2017-10-28 DIAGNOSIS — E785 Hyperlipidemia, unspecified: Secondary | ICD-10-CM | POA: Diagnosis not present

## 2017-10-28 DIAGNOSIS — K219 Gastro-esophageal reflux disease without esophagitis: Secondary | ICD-10-CM | POA: Insufficient documentation

## 2017-10-28 DIAGNOSIS — R7303 Prediabetes: Secondary | ICD-10-CM | POA: Diagnosis not present

## 2017-10-28 DIAGNOSIS — K5730 Diverticulosis of large intestine without perforation or abscess without bleeding: Secondary | ICD-10-CM | POA: Insufficient documentation

## 2017-10-28 DIAGNOSIS — Z7982 Long term (current) use of aspirin: Secondary | ICD-10-CM | POA: Insufficient documentation

## 2017-10-28 DIAGNOSIS — Z91041 Radiographic dye allergy status: Secondary | ICD-10-CM | POA: Diagnosis not present

## 2017-10-28 DIAGNOSIS — I10 Essential (primary) hypertension: Secondary | ICD-10-CM | POA: Diagnosis not present

## 2017-10-28 DIAGNOSIS — R194 Change in bowel habit: Secondary | ICD-10-CM

## 2017-10-28 DIAGNOSIS — R1011 Right upper quadrant pain: Secondary | ICD-10-CM

## 2017-10-28 DIAGNOSIS — G4730 Sleep apnea, unspecified: Secondary | ICD-10-CM | POA: Diagnosis not present

## 2017-10-28 DIAGNOSIS — K644 Residual hemorrhoidal skin tags: Secondary | ICD-10-CM | POA: Diagnosis not present

## 2017-10-28 DIAGNOSIS — Z9989 Dependence on other enabling machines and devices: Secondary | ICD-10-CM | POA: Insufficient documentation

## 2017-10-28 DIAGNOSIS — Z888 Allergy status to other drugs, medicaments and biological substances status: Secondary | ICD-10-CM | POA: Diagnosis not present

## 2017-10-28 DIAGNOSIS — Z905 Acquired absence of kidney: Secondary | ICD-10-CM | POA: Insufficient documentation

## 2017-10-28 HISTORY — PX: COLONOSCOPY WITH PROPOFOL: SHX5780

## 2017-10-28 LAB — GLUCOSE, CAPILLARY
GLUCOSE-CAPILLARY: 94 mg/dL (ref 65–99)
Glucose-Capillary: 124 mg/dL — ABNORMAL HIGH (ref 65–99)

## 2017-10-28 SURGERY — COLONOSCOPY WITH PROPOFOL
Anesthesia: Monitor Anesthesia Care

## 2017-10-28 MED ORDER — MIDAZOLAM HCL 2 MG/2ML IJ SOLN
INTRAMUSCULAR | Status: AC
  Filled 2017-10-28: qty 2

## 2017-10-28 MED ORDER — LACTATED RINGERS IV SOLN
INTRAVENOUS | Status: DC
  Administered 2017-10-28: 1000 mL via INTRAVENOUS

## 2017-10-28 MED ORDER — MIDAZOLAM HCL 2 MG/2ML IJ SOLN
1.0000 mg | INTRAMUSCULAR | Status: AC
  Administered 2017-10-28: 2 mg via INTRAVENOUS

## 2017-10-28 MED ORDER — PROPOFOL 500 MG/50ML IV EMUL
INTRAVENOUS | Status: DC | PRN
  Administered 2017-10-28: 100 ug/kg/min via INTRAVENOUS

## 2017-10-28 MED ORDER — GLYCOPYRROLATE 0.2 MG/ML IJ SOLN
0.2000 mg | Freq: Once | INTRAMUSCULAR | Status: AC | PRN
  Administered 2017-10-28: 0.2 mg via INTRAVENOUS

## 2017-10-28 MED ORDER — PANTOPRAZOLE SODIUM 40 MG PO TBEC: 40.0000 mg | DELAYED_RELEASE_TABLET | Freq: Two times a day (BID) | ORAL | 2 refills | Status: DC

## 2017-10-28 MED ORDER — FENTANYL CITRATE (PF) 100 MCG/2ML IJ SOLN
INTRAMUSCULAR | Status: AC
  Filled 2017-10-28: qty 2

## 2017-10-28 MED ORDER — ATROPINE SULFATE 0.4 MG/ML IJ SOLN
INTRAMUSCULAR | Status: DC | PRN
  Administered 2017-10-28: 0.2 mg via INTRAVENOUS

## 2017-10-28 MED ORDER — GLYCOPYRROLATE 0.2 MG/ML IJ SOLN
INTRAMUSCULAR | Status: AC
  Filled 2017-10-28: qty 1

## 2017-10-28 MED ORDER — ONDANSETRON HCL 4 MG/2ML IJ SOLN
4.0000 mg | Freq: Once | INTRAMUSCULAR | Status: AC
  Administered 2017-10-28: 4 mg via INTRAVENOUS

## 2017-10-28 MED ORDER — FENTANYL CITRATE (PF) 100 MCG/2ML IJ SOLN
25.0000 ug | Freq: Once | INTRAMUSCULAR | Status: AC
  Administered 2017-10-28: 25 ug via INTRAVENOUS

## 2017-10-28 MED ORDER — PROPOFOL 10 MG/ML IV BOLUS
INTRAVENOUS | Status: DC | PRN
  Administered 2017-10-28 (×2): 20 mg via INTRAVENOUS

## 2017-10-28 MED ORDER — ONDANSETRON HCL 4 MG/2ML IJ SOLN
INTRAMUSCULAR | Status: AC
Start: 2017-10-28 — End: 2017-10-28
  Filled 2017-10-28: qty 2

## 2017-10-28 NOTE — Discharge Instructions (Signed)
Increase pantoprazole to 40 mg by mouth twice daily. Resume other medications as before. High fiber diet. No driving for 24 hours. Next colonoscopy in 10 years.   Colonoscopy, Adult, Care After This sheet gives you information about how to care for yourself after your procedure. Your health care provider may also give you more specific instructions. If you have problems or questions, contact your health care provider. What can I expect after the procedure? After the procedure, it is common to have:  A small amount of blood in your stool for 24 hours after the procedure.  Some gas.  Mild abdominal cramping or bloating.  Follow these instructions at home: General instructions   For the first 24 hours after the procedure: ? Do not drive or use machinery. ? Do not sign important documents. ? Do not drink alcohol. ? Do your regular daily activities at a slower pace than normal. ? Eat soft, easy-to-digest foods. ? Rest often.  Take over-the-counter or prescription medicines only as told by your health care provider.  It is up to you to get the results of your procedure. Ask your health care provider, or the department performing the procedure, when your results will be ready. Relieving cramping and bloating  Try walking around when you have cramps or feel bloated.  Apply heat to your abdomen as told by your health care provider. Use a heat source that your health care provider recommends, such as a moist heat pack or a heating pad. ? Place a towel between your skin and the heat source. ? Leave the heat on for 20-30 minutes. ? Remove the heat if your skin turns bright red. This is especially important if you are unable to feel pain, heat, or cold. You may have a greater risk of getting burned. Eating and drinking  Drink enough fluid to keep your urine clear or pale yellow.  Resume your normal diet as instructed by your health care provider. Avoid heavy or fried foods that are  hard to digest.  Avoid drinking alcohol for as long as instructed by your health care provider. Contact a health care provider if:  You have blood in your stool 2-3 days after the procedure. Get help right away if:  You have more than a small spotting of blood in your stool.  You pass large blood clots in your stool.  Your abdomen is swollen.  You have nausea or vomiting.  You have a fever.  You have increasing abdominal pain that is not relieved with medicine. This information is not intended to replace advice given to you by your health care provider. Make sure you discuss any questions you have with your health care provider.   High-Fiber Diet Fiber, also called dietary fiber, is a type of carbohydrate found in fruits, vegetables, whole grains, and beans. A high-fiber diet can have many health benefits. Your health care provider may recommend a high-fiber diet to help:  Prevent constipation. Fiber can make your bowel movements more regular.  Lower your cholesterol.  Relieve hemorrhoids, uncomplicated diverticulosis, or irritable bowel syndrome.  Prevent overeating as part of a weight-loss plan.  Prevent heart disease, type 2 diabetes, and certain cancers.  What is my plan? The recommended daily intake of fiber includes:  38 grams for men under age 25.  60 grams for men over age 49.  73 grams for women under age 48.  64 grams for women over age 3.  You can get the recommended daily intake of dietary  fiber by eating a variety of fruits, vegetables, grains, and beans. Your health care provider may also recommend a fiber supplement if it is not possible to get enough fiber through your diet. What do I need to know about a high-fiber diet?  Fiber supplements have not been widely studied for their effectiveness, so it is better to get fiber through food sources.  Always check the fiber content on thenutrition facts label of any prepackaged food. Look for foods that  contain at least 5 grams of fiber per serving.  Ask your dietitian if you have questions about specific foods that are related to your condition, especially if those foods are not listed in the following section.  Increase your daily fiber consumption gradually. Increasing your intake of dietary fiber too quickly may cause bloating, cramping, or gas.  Drink plenty of water. Water helps you to digest fiber. What foods can I eat? Grains Whole-grain breads. Multigrain cereal. Oats and oatmeal. Brown rice. Barley. Bulgur wheat. [place]. Bran muffins. Popcorn. Rye wafer crackers. Vegetables Sweet potatoes. Spinach. Kale. Artichokes. Cabbage. Broccoli. Green peas. Carrots. Squash. Fruits Berries. Pears. Apples. Oranges. Avocados. Prunes and raisins. Dried figs. Meats and Other Protein Sources [place], kidney, pinto, and soy beans. Split peas. Lentils. Nuts and seeds. Dairy Fiber-fortified yogurt. Beverages Fiber-fortified soy milk. Fiber-fortified orange juice. Other Fiber bars. The items listed above may not be a complete list of recommended foods or beverages. Contact your dietitian for more options. What foods are not recommended? Grains White bread. Pasta made with refined flour. White rice. Vegetables Fried potatoes. Canned vegetables. Well-cooked vegetables. Fruits Fruit juice. Cooked, strained fruit. Meats and Other Protein Sources Fatty cuts of meat. Fried Sales executive or fried fish. Dairy Milk. Yogurt. Cream cheese. Sour cream. Beverages Soft drinks. Other Cakes and pastries. Butter and oils. The items listed above may not be a complete list of foods and beverages to avoid. Contact your dietitian for more information. What are some tips for including high-fiber foods in my diet?  Eat a wide variety of high-fiber foods.  Make sure that half of all grains consumed each day are whole grains.  Replace breads and cereals made from refined flour or white flour with whole-grain breads  and cereals.  Replace white rice with brown rice, bulgur wheat, or millet.  Start the day with a breakfast that is high in fiber, such as a cereal that contains at least 5 grams of fiber per serving.  Use beans in place of meat in soups, salads, or pasta.  Eat high-fiber snacks, such as berries, raw vegetables, nuts, or popcorn. This information is not intended to replace advice given to you by your health care provider. Make sure you discuss any questions you have with your health care provider.

## 2017-10-28 NOTE — Anesthesia Postprocedure Evaluation (Addendum)
Anesthesia Post Note  Patient: Rebecca Scott  Procedure(s) Performed: COLONOSCOPY WITH PROPOFOL (N/A )  Patient location during evaluation: PACU Anesthesia Type: MAC Level of consciousness: awake and alert and patient cooperative Pain management: satisfactory to patient Vital Signs Assessment: post-procedure vital signs reviewed and stable Respiratory status: spontaneous breathing Cardiovascular status: stable Postop Assessment: no apparent nausea or vomiting Anesthetic complications: no     Last Vitals:  Vitals:   10/28/17 0815 10/28/17 0833  BP: (!) 97/54 (!) 100/59  Pulse: 69 65  Resp: 14 16  Temp:  36.6 C  SpO2: 100% 100%    Last Pain:  Vitals:   10/28/17 0833  TempSrc: Oral  PainSc: 0-No pain                 Chayanne Speir

## 2017-10-28 NOTE — Anesthesia Procedure Notes (Signed)
Procedure Name: MAC Date/Time: 10/28/2017 7:30 AM Performed by: Vista Deck, CRNA Pre-anesthesia Checklist: Patient identified, Emergency Drugs available, Suction available, Timeout performed and Patient being monitored Patient Re-evaluated:Patient Re-evaluated prior to induction Oxygen Delivery Method: Non-rebreather mask

## 2017-10-28 NOTE — Transfer of Care (Signed)
Immediate Anesthesia Transfer of Care Note  Patient: Rebecca Scott  Procedure(s) Performed: COLONOSCOPY WITH PROPOFOL (N/A )  Patient Location: PACU  Anesthesia Type:MAC  Level of Consciousness: awake, alert  and patient cooperative  Airway & Oxygen Therapy: Patient Spontanous Breathing and non-rebreather face mask  Post-op Assessment: Report given to RN and Post -op Vital signs reviewed and stable  Post vital signs: Reviewed and stable  Last Vitals:  Vitals:   10/28/17 0715 10/28/17 0730  BP: (!) 115/54 (!) 118/56  Pulse:    Resp: (!) 22 14  Temp:    SpO2: 95% 93%    Last Pain:  Vitals:   10/28/17 0732  PainSc: 3          Complications: No apparent anesthesia complications

## 2017-10-28 NOTE — Anesthesia Preprocedure Evaluation (Signed)
Anesthesia Evaluation  Patient identified by MRN, date of birth, ID band Patient awake    Reviewed: Allergy & Precautions, NPO status , Patient's Chart, lab work & pertinent test results  History of Anesthesia Complications (+) PONV and history of anesthetic complications  Airway Mallampati: II  TM Distance: >3 FB Neck ROM: Full    Dental no notable dental hx.    Pulmonary sleep apnea and Continuous Positive Airway Pressure Ventilation ,    Pulmonary exam normal breath sounds clear to auscultation       Cardiovascular hypertension, Pt. on medications Normal cardiovascular exam Rhythm:Regular Rate:Normal     Neuro/Psych  Headaches, negative psych ROS   GI/Hepatic negative GI ROS, Neg liver ROS, GERD  ,  Endo/Other  negative endocrine ROSdiabetes (pre-DM)  Renal/GU Renal diseasenegative Renal ROS  negative genitourinary   Musculoskeletal negative musculoskeletal ROS (+)   Abdominal   Peds negative pediatric ROS (+)  Hematology negative hematology ROS (+) anemia ,   Anesthesia Other Findings   Reproductive/Obstetrics negative OB ROS                             Anesthesia Physical Anesthesia Plan  ASA: III  Anesthesia Plan: MAC   Post-op Pain Management:    Induction: Intravenous  PONV Risk Score and Plan:   Airway Management Planned: Simple Face Mask  Additional Equipment:   Intra-op Plan:   Post-operative Plan:   Informed Consent: I have reviewed the patients History and Physical, chart, labs and discussed the procedure including the risks, benefits and alternatives for the proposed anesthesia with the patient or authorized representative who has indicated his/her understanding and acceptance.     Plan Discussed with:   Anesthesia Plan Comments:         Anesthesia Quick Evaluation

## 2017-10-28 NOTE — Op Note (Signed)
[facility] Patient Name: Rebecca Scott Procedure Date: 10/28/2017 7:03 AM MRN: [id number] Date of Birth: 1956-04-04 Attending MD: Hildred Laser , MD CSN: [id number] Age: 62 Admit Type: Outpatient Procedure:                Colonoscopy Indications:              Change in bowel habits, Incidental abdominal pain                            noted Providers:                Hildred Laser, MD, Otis Peak B. Sharon Seller, RN, Aram Candela Referring MD:             Asencion Noble, MD Medicines:                Propofol per Anesthesia Complications:            No immediate complications. Estimated Blood Loss:     Estimated blood loss: none. Procedure:                Pre-Anesthesia Assessment:                           - Prior to the procedure, a History and Physical                            was performed, and patient medications and                            allergies were reviewed. The patient's tolerance of                            previous anesthesia was also reviewed. The risks                            and benefits of the procedure and the sedation                            options and risks were discussed with the patient.                            All questions were answered, and informed consent                            was obtained. Prior Anticoagulants: The patient has                            taken no previous anticoagulant or antiplatelet                            agents. ASA Grade Assessment: III - A patient with  severe systemic disease. After reviewing the risks                            and benefits, the patient was deemed in                            satisfactory condition to undergo the procedure.                           After obtaining informed consent, the colonoscope                            was passed under direct vision. Throughout the                            procedure, the patient's blood pressure, pulse,  and                            oxygen saturations were monitored continuously. The                            EC-[id number] ([id number]) scope was introduced through                            the and advanced to the the cecum, identified by                            appendiceal orifice and ileocecal valve. The                            ileocecal valve, appendiceal orifice, and rectum                            were photographed. The colonoscopy was somewhat                            difficult due to significant looping. Successful                            completion of the procedure was aided by using                            manual pressure and withdrawing and reinserting the                            scope. The quality of the bowel preparation was                            excellent. Scope In: 7:39:27 AM Scope Out: 7:57:44 AM Scope Withdrawal Time: 0 hours 10 minutes 6 seconds  Total Procedure Duration: 0 hours 18 minutes 17 seconds  Findings:      The perianal and digital rectal examinations were normal.      Two small-mouthed diverticula were found in the sigmoid colon.  The exam was otherwise normal throughout the examined colon.      External hemorrhoids were found during retroflexion. The hemorrhoids       were small. Impression:               - Diverticulosis in the sigmoid colon.                           - External hemorrhoids.                           - No specimens collected. Moderate Sedation:      Per Anesthesia Care Recommendation:           - Patient has a contact number available for                            emergencies. The signs and symptoms of potential                            delayed complications were discussed with the                            patient. Return to normal activities tomorrow.                            Written discharge instructions were provided to the                            patient.                           - High fiber diet  today.                           - Continue present medications.                           - Repeat colonoscopy in 10 years for screening                            purposes. Procedure Code(s):        --- Professional ---                           [phone], Colonoscopy, flexible; diagnostic, including                            collection of specimen(s) by brushing or washing,                            when performed (separate procedure) Diagnosis Code(s):        --- Professional ---                           K64.4, Residual hemorrhoidal skin tags  R19.4, Change in bowel habit                           K57.30, Diverticulosis of large intestine without                            perforation or abscess without bleeding CPT copyright 2016 American Medical Association. All rights reserved. The codes documented in this report are preliminary and upon coder review may  be revised to meet current compliance requirements. Hildred Laser, MD Hildred Laser, MD 10/28/2017 8:07:37 AM This report has been signed electronically. Number of Addenda: 0

## 2017-10-28 NOTE — H&P (Signed)
Rebecca Scott is an 62 y.o. female.   Chief Complaint: Patient is here for colonoscopy. HPI: Patient is 63 year old Caucasian female who presents with several week history of irregular bowel movements.  She has diarrhea and/or constipation.  She also has noted her stool to be pencil thin.  She denies melena or rectal bleeding.  She remains with right upper quadrant pain which started over 6 months ago.  She has undergone extensive workup which has been negative.  She has been evaluated at [facility] and begun on gabapentin.  She also is being evaluated pain clinic and she was advised to increase pantoprazole to twice daily.  She wanted to talk with me today before increasing the dose.  Heartburn is well controlled but she also complains of intermittent mild epigastric pain.  She denies nausea vomiting or weight loss. Family history is significant for ulcerative colitis and maternal aunt and Crohn's disease in maternal grandmother.  Last colonoscopy was normal 7 years ago. Family history is negative for CRC.  Past Medical History:  Diagnosis Date  .    Marland Kitchen Asthma    seasonal asthma rarely   .  History of small renal cell carcinoma right kidney; status post robotic partial nephrectomy in September 2018.      Marland Kitchen CPAP (continuous positive airway pressure) dependence   . Diverticulitis   . GERD (gastroesophageal reflux disease)   . Hemorrhoids   . Hyperlipemia   . Hypertension   . Migraine   . PONV (postoperative nausea and vomiting)    patient reports being "slower to wake than average "   . Pre-diabetes    "ive been told i'm borderline"   . Sleep apnea    CPAP use     Past Surgical History:  Procedure Laterality Date  . ABDOMINAL HYSTERECTOMY    . APPENDECTOMY  1978  . BIOPSY  07/08/2017   Procedure: BIOPSY;  Surgeon: Rogene Houston, MD;  [facility];  Service: Endoscopy;;  gastric  . CHOLECYSTECTOMY  2005  . ESOPHAGOGASTRODUODENOSCOPY N/A 07/08/2017   Procedure:  ESOPHAGOGASTRODUODENOSCOPY (EGD);  Surgeon: Rogene Houston, MD;  [facility];  Service: Endoscopy;  Laterality: N/A;  220  . NASAL SEPTUM SURGERY    . ROBOTIC ASSITED PARTIAL NEPHRECTOMY Right 05/17/2017   Procedure: XI ROBOTIC ASSITED PARTIAL NEPHRECTOMY;  Surgeon: Alexis Frock, MD;  Location: [facility];  Service: Urology;  Laterality: Right;    Family History  Problem Relation Age of Onset  . COPD Mother   . Diabetes Mother   . Hypertension Mother   . High Cholesterol Mother   . COPD Sister   . Hypertension Brother   . High Cholesterol Brother   . Hypertension Brother   . Asthma Son    Social History:  reports that  has never smoked. she has never used smokeless tobacco. She reports that she drinks alcohol. She reports that she does not use drugs.  Allergies:  Allergies  Allergen Reactions  . Iodinated Diagnostic Agents Hives and Itching  . Isovue [Iopamidol] Itching    And hives   . Nitrofurantoin Hives  . Sulfa Antibiotics Hives  . Adhesive [Tape] Rash and Other (See Comments)    Paper tape ONLY  . Mango Flavor Swelling and Rash    Tongue swelling, feels like throat is closing in on her     Medications Prior to Admission  Medication Sig Dispense Refill  . clonazePAM (KLONOPIN) 0.5 MG tablet Take 0.5 mg by  mouth 2 (two) times daily.     . DULoxetine (CYMBALTA) 20 MG capsule Take 20 mg by mouth 2 (two) times daily.    Marland Kitchen gabapentin (NEURONTIN) 100 MG capsule Take 100 mg by mouth 3 (three) times daily.  3  . HYDROcodone-acetaminophen (NORCO/VICODIN) 5-325 MG tablet Take 1-2 tablets by mouth every 6 (six) hours as needed for moderate pain. (Patient taking differently: Take 1 tablet by mouth daily as needed for moderate pain. ) 20 tablet 0  . lidocaine (LIDODERM) 5 % Place 1 patch onto the skin daily. Remove & Discard patch within 12 hours or as directed by MD (Patient taking differently: Place 1 patch onto the skin daily as needed (pain). Remove & Discard patch  within 12 hours or as directed by MD) 30 patch 1  . loratadine (CLARITIN) 10 MG tablet Take 10 mg by mouth daily as needed for allergies.    Marland Kitchen losartan-hydrochlorothiazide (HYZAAR) 100-25 MG tablet Take 1 tablet by mouth daily.    . ondansetron (ZOFRAN ODT) 4 MG disintegrating tablet Take 1 tablet (4 mg total) every 8 (eight) hours as needed by mouth for nausea or vomiting. 4mg  ODT q4 hours prn nausea/vomit 30 tablet 0  . pantoprazole (PROTONIX) 40 MG tablet Take 1 tablet (40 mg total) by mouth daily before breakfast. (Patient taking differently: Take 40 mg by mouth daily. ) 30 tablet 5  . potassium chloride SA (K-DUR,KLOR-CON) 20 MEQ tablet Take 20 mEq by mouth daily.     . rosuvastatin (CRESTOR) 5 MG tablet Take 5 mg by mouth daily.     Marland Kitchen aspirin EC 81 MG tablet Take 1 tablet (81 mg total) by mouth daily.      Results for orders placed or performed during the hospital encounter of 10/28/17 (from the past 48 hour(s))  Glucose, capillary     Status: Abnormal   Collection Time: 10/28/17  6:54 AM  Result Value Ref Range   Glucose-Capillary 124 (H) 65 - 99 mg/dL   No results found.  ROS  Pulse (!) 53, temperature 98.2 F (36.8 C), resp. rate (!) 21, SpO2 94 %. Physical Exam  Constitutional: She appears well-developed and well-nourished.  HENT:  Mouth/Throat: Oropharynx is clear and moist.  Eyes: Conjunctivae are normal. No scleral icterus.  Neck: No thyromegaly present.  Cardiovascular: Normal rate, regular rhythm and normal heart sounds.  No murmur heard. Respiratory: Effort normal and breath sounds normal.  GI:  Abdomen is full.  It is soft with mild tenderness below the right costal margin epigastric region.  No organomegaly or masses.  Lymphadenopathy:    She has no cervical adenopathy.     Assessment/Plan Change in bowel habits. Chronic right upper quadrant pain felt to be unrelated to GI tract. Diagnostic colonoscopy.  Hildred Laser, MD 10/28/2017, 7:18 AM

## 2017-10-31 ENCOUNTER — Encounter (HOSPITAL_COMMUNITY): Payer: Self-pay | Admitting: Internal Medicine

## 2017-11-05 ENCOUNTER — Encounter: Payer: Self-pay | Admitting: Physical Therapy

## 2017-11-05 ENCOUNTER — Ambulatory Visit: Payer: 59 | Attending: Anesthesiology | Admitting: Physical Therapy

## 2017-11-05 DIAGNOSIS — M545 Low back pain: Secondary | ICD-10-CM | POA: Insufficient documentation

## 2017-11-05 DIAGNOSIS — G8929 Other chronic pain: Secondary | ICD-10-CM

## 2017-11-05 DIAGNOSIS — M546 Pain in thoracic spine: Secondary | ICD-10-CM

## 2017-11-05 NOTE — Therapy (Signed)
[facility] [place], [place], [address] Phone: [phone]   Fax:  [phone]  Physical Therapy Evaluation  Patient Details  Name: Rebecca Scott MRN: [id number] Date of Birth: September 27, 1955 Referring Provider: Barnet Glasgow   Encounter Date: 11/05/2017  PT End of Session - 11/05/17 1554    Visit Number  1    Number of Visits  12    Date for PT Re-Evaluation  12/17/17    PT Start Time  0235    PT Stop Time  0324    PT Time Calculation (min)  49 min    Activity Tolerance  Patient tolerated treatment well    Behavior During Therapy  [facility] for tasks assessed/performed       Past Medical History:  Diagnosis Date  . Anemia   . Asthma    seasonal asthma rarely   . Chronic kidney disease    renal mass right kidney   . CPAP (continuous positive airway pressure) dependence   . Diverticulitis   . GERD (gastroesophageal reflux disease)   . Hemorrhoids   . Hyperlipemia   . Hypertension   . Migraine   . PONV (postoperative nausea and vomiting)    patient reports being "slower to wake than average "   . Pre-diabetes    "ive been told i'm borderline"   . Sleep apnea    CPAP use     Past Surgical History:  Procedure Laterality Date  . ABDOMINAL HYSTERECTOMY    . APPENDECTOMY  1978  . BIOPSY  07/08/2017   Procedure: BIOPSY;  Surgeon: Rogene Houston, MD;  [facility];  Service: Endoscopy;;  gastric  . CHOLECYSTECTOMY  2005  . COLONOSCOPY WITH PROPOFOL N/A 10/28/2017   Procedure: COLONOSCOPY WITH PROPOFOL;  Surgeon: Rogene Houston, MD;  [facility];  Service: Endoscopy;  Laterality: N/A;  7:30  . ESOPHAGOGASTRODUODENOSCOPY N/A 07/08/2017   Procedure: ESOPHAGOGASTRODUODENOSCOPY (EGD);  Surgeon: Rogene Houston, MD;  [facility];  Service: Endoscopy;  Laterality: N/A;  220  . NASAL SEPTUM SURGERY    . ROBOTIC ASSITED PARTIAL NEPHRECTOMY Right 05/17/2017   Procedure: XI ROBOTIC ASSITED  PARTIAL NEPHRECTOMY;  Surgeon: Alexis Frock, MD;  [facility];  Service: Urology;  Laterality: Right;    There were no vitals filed for this visit.   Subjective Assessment - 11/05/17 1652    Subjective  The patient reports ongoing mid and low back since october of 2018.  She reports radiating pain into her right anterior chest wall.  She also reports right sided low back pain.  Her reported pain-level in both regions is rated at a 6/10 and higher with prolonged sitting and walking.      Pertinent History  Partial kidney removal (2018); knee pain.    Limitations  Sitting    How long can you sit comfortably?  15 minutes.    How long can you walk comfortably?  Short community distances.    Diagnostic tests  Multilevel lumbar DDD.    Pain Score  6     Pain Location  Back    Pain Orientation  Right    Pain Descriptors / Indicators  Aching;Shooting;Sharp    Pain Type  Acute pain         [facility] PT Assessment - 11/05/17 0001      Assessment   Medical Diagnosis  Thoracic and low abck pain.    Referring Provider  Barnet Glasgow  Onset Date/Surgical Date  -- October 2018.      Precautions   Precautions  None      Restrictions   Weight Bearing Restrictions  No      Balance Screen   Has the patient fallen in the past 6 months  Yes    How many times?  -- 1.    Has the patient had a decrease in activity level because of a fear of falling?   No    Is the patient reluctant to leave their home because of a fear of falling?   No      Home Environment   Living Environment  Private residence      Prior Function   Level of Independence  Independent      Posture/Postural Control   Posture Comments  The patient's posture is quite good.      ROM / Strength   AROM / PROM / Strength  AROM;Strength      AROM   Overall AROM Comments  Active lumbar extension= 9 degrees and flexion limited by 75% at least in part due to pain.      Strength   Overall Strength Comments  Normal  bilateral LE strength.      Palpation   Palpation comment  Tender to palpation over right mid-thoracic region from T5 to T8 and very tender over right lumbar erector spinae musculature.      Special Tests    Special Tests  -- Bilateral LE DTR's= 1+/4+.    Other special tests  (-)SLR;(-)FABER testing and equal leg lengths.      Ambulation/Gait   Gait Comments  WNL.             Objective measurements completed on examination: See above findings.      [facility] Adult PT Treatment/Exercise - 11/05/17 0001      Modalities   Modalities  Electrical Stimulation;Moist Heat      Moist Heat Therapy   Number Minutes Moist Heat  15 Minutes    Moist Heat Location  -- T and L-spine.      Electrical Stimulation   Electrical Stimulation Location  Right mid-back and right low back.    Electrical Stimulation Action  Pre-mod.    Electrical Stimulation Parameters  Constant at 80-150 Hz x 15 minutes.                  PT Long Term Goals - 11/05/17 1732      PT LONG TERM GOAL #1   Title  Independent with a HEP.    Time  6    Period  Weeks    Status  New      PT LONG TERM GOAL #2   Title  Sit 30 minutes with pain not > 3/10.    Time  6    Period  Weeks    Status  New      PT LONG TERM GOAL #3   Title  Walk a community distance with pain not > 3/10.    Time  6    Period  Weeks    Status  New      PT LONG TERM GOAL #4   Title  Perform ADL's with pain not > 3/10.    Time  6    Period  Weeks    Status  New             Plan - 11/05/17 1728    Clinical Impression Statement  The patient presents to OPPT with c/o right sided mid and low back pain.  She has radiating pain to her right anterior chest wall.  She is palpably tender in her mid right thoracic region and right low back region.  Her lumbar range of motion is very limited and her pain and deficits have impaired her functional mobility.  Patient will benefit from skilled physical therapy intervention.     Clinical Presentation  Stable    Clinical Decision Making  Low    Rehab Potential  Good    PT Frequency  2x / week    PT Duration  6 weeks    PT Treatment/Interventions  ADLs/Self Care Home Management;Ultrasound;Traction;Moist Heat;Therapeutic activities;Therapeutic exercise;Patient/family education;Manual techniques;Dry needling    PT Next Visit Plan  Modalited as needed.  Right thoracic and costo-vertebral mobs; STW/M to right low back musculature.  Scapular strengthening and low back/core exercises.    Consulted and Agree with Plan of Care  Patient       Patient will benefit from skilled therapeutic intervention in order to improve the following deficits and impairments:  Decreased activity tolerance, Decreased range of motion, Pain  Visit Diagnosis: Pain in thoracic spine - Plan: PT plan of care cert/re-cert  Chronic right-sided low back pain without sciatica - Plan: PT plan of care cert/re-cert     Problem List Patient Active Problem List   Diagnosis Date Noted  . RUQ abdominal pain 10/07/2017  . Change in bowel habits 10/07/2017  . Abdominal pain, right upper quadrant 07/05/2017  . Gastroesophageal reflux disease 07/05/2017  . Renal mass 05/17/2017  . HTN (hypertension) 07/29/2014  . Hyperlipidemia 07/29/2014  . Syncope 07/29/2014    Lossie Kalp, [place] MPT 11/05/2017, 5:36 PM  [facility] [place], [place], [address] Phone: [phone]   Fax:  [phone]  Name: Rebecca Scott MRN: [id number] Date of Birth: [birth year]

## 2017-11-07 ENCOUNTER — Ambulatory Visit: Payer: 59 | Admitting: Physical Therapy

## 2017-11-07 DIAGNOSIS — M545 Low back pain: Secondary | ICD-10-CM

## 2017-11-07 DIAGNOSIS — G8929 Other chronic pain: Secondary | ICD-10-CM

## 2017-11-07 DIAGNOSIS — M546 Pain in thoracic spine: Secondary | ICD-10-CM

## 2017-11-07 NOTE — Therapy (Addendum)
[facility] [place], [place], [address] Phone: [phone]   Fax:  [phone]  Physical Therapy Treatment  Patient Details  Name: Rebecca Scott MRN: [id number] Date of Birth: 25-Mar-1956 Referring Provider: Barnet Glasgow   Encounter Date: 11/07/2017  PT End of Session - 11/07/17 1602    Visit Number  2    Number of Visits  12    Date for PT Re-Evaluation  12/17/17    PT Start Time  1601    PT Stop Time  1652    PT Time Calculation (min)  51 min    Activity Tolerance  Patient tolerated treatment well    Behavior During Therapy  [facility] for tasks assessed/performed       Past Medical History:  Diagnosis Date  . Anemia   . Asthma    seasonal asthma rarely   . Chronic kidney disease    renal mass right kidney   . CPAP (continuous positive airway pressure) dependence   . Diverticulitis   . GERD (gastroesophageal reflux disease)   . Hemorrhoids   . Hyperlipemia   . Hypertension   . Migraine   . PONV (postoperative nausea and vomiting)    patient reports being "slower to wake than average "   . Pre-diabetes    "ive been told i'm borderline"   . Sleep apnea    CPAP use     Past Surgical History:  Procedure Laterality Date  . ABDOMINAL HYSTERECTOMY    . APPENDECTOMY  1978  . BIOPSY  07/08/2017   Procedure: BIOPSY;  Surgeon: Rogene Houston, MD;  [facility];  Service: Endoscopy;;  gastric  . CHOLECYSTECTOMY  2005  . COLONOSCOPY WITH PROPOFOL N/A 10/28/2017   Procedure: COLONOSCOPY WITH PROPOFOL;  Surgeon: Rogene Houston, MD;  [facility];  Service: Endoscopy;  Laterality: N/A;  7:30  . ESOPHAGOGASTRODUODENOSCOPY N/A 07/08/2017   Procedure: ESOPHAGOGASTRODUODENOSCOPY (EGD);  Surgeon: Rogene Houston, MD;  [facility];  Service: Endoscopy;  Laterality: N/A;  220  . NASAL SEPTUM SURGERY    . ROBOTIC ASSITED PARTIAL NEPHRECTOMY Right 05/17/2017   Procedure: XI ROBOTIC ASSITED  PARTIAL NEPHRECTOMY;  Surgeon: Alexis Frock, MD;  Location: [facility];  Service: Urology;  Laterality: Right;    There were no vitals filed for this visit.  Subjective Assessment - 11/07/17 1603    Subjective  Patient reported thoracic and low back pain is about a 5-6/10.    Pertinent History  Partial kidney removal (2018); knee pain.    Limitations  Sitting    How long can you sit comfortably?  15 minutes.    How long can you walk comfortably?  Short community distances.    Diagnostic tests  Multilevel lumbar DDD.    Currently in Pain?  Yes    Pain Score  6     Pain Location  Back    Pain Orientation  Right;Mid    Pain Descriptors / Indicators  Sore;Aching    Pain Type  Acute pain         [facility] PT Assessment - 11/07/17 0001      Assessment   Medical Diagnosis  Thoracic and low abck pain.      Precautions   Precautions  None                  [facility] Adult PT Treatment/Exercise - 11/07/17 0001      Exercises   Exercises  Lumbar      Lumbar Exercises: Seated   Other Seated Lumbar Exercises  scapular retractions x20      Lumbar Exercises: Supine   Pelvic Tilt  20 reps;5 seconds      Modalities   Modalities  Electrical Stimulation      Moist Heat Therapy   Number Minutes Moist Heat  10 Minutes      Electrical Stimulation   Electrical Stimulation Location  Right mid-back and right low back.    Electrical Stimulation Action  Pre-mod    Electrical Stimulation Parameters  80-150 Hz x10    Electrical Stimulation Goals  Pain      Manual Therapy   Manual Therapy  Soft tissue mobilization;Joint mobilization    Manual therapy comments  STW/M to right low back/QL and thoracic paraspinals     Joint Mobilization  Costovertebral mobs and mid thoracic mobilization Grade II for pain        Nustep Level 3 x10 minutes Bridging 2x10           PT Long Term Goals - 11/05/17 1732      PT LONG TERM GOAL #1   Title  Independent with a HEP.    Time  6     Period  Weeks    Status  New      PT LONG TERM GOAL #2   Title  Sit 30 minutes with pain not > 3/10.    Time  6    Period  Weeks    Status  New      PT LONG TERM GOAL #3   Title  Walk a community distance with pain not > 3/10.    Time  6    Period  Weeks    Status  New      PT LONG TERM GOAL #4   Title  Perform ADL's with pain not > 3/10.    Time  6    Period  Weeks    Status  New            Plan - 11/07/17 1820    Clinical Impression Statement  Patient was limited with today's treatment secondary to pain. Patient stated she had low back pain with nustep but was unable to describe her pain. Patient also stated "pulling" with bridging but continued to end of set. Patient very tender to palpation at R QL and along thoracic paraspinals. Patient stated improvement in pain after modalities. No adverse effects noted upon removal.    Clinical Presentation  Stable    Clinical Decision Making  Low    Rehab Potential  Good    PT Frequency  2x / week    PT Duration  6 weeks    PT Treatment/Interventions  ADLs/Self Care Home Management;Ultrasound;Traction;Moist Heat;Therapeutic activities;Therapeutic exercise;Patient/family education;Manual techniques;Dry needling    PT Next Visit Plan  Modalited as needed.  Right thoracic and costo-vertebral mobs; STW/M to right low back musculature.  Scapular strengthening and low back/core exercises.    Consulted and Agree with Plan of Care  Patient       Patient will benefit from skilled therapeutic intervention in order to improve the following deficits and impairments:  Decreased activity tolerance, Decreased range of motion, Pain  Visit Diagnosis: Pain in thoracic spine  Chronic right-sided low back pain without sciatica     Problem List Patient Active Problem List   Diagnosis Date Noted  . RUQ abdominal pain 10/07/2017  . Change in bowel habits 10/07/2017  .  Abdominal pain, right upper quadrant 07/05/2017  . Gastroesophageal  reflux disease 07/05/2017  . Renal mass 05/17/2017  . HTN (hypertension) 07/29/2014  . Hyperlipidemia 07/29/2014  . Syncope 07/29/2014   Gabriela Eves, PT, DPT 11/07/2017, 6:24 PM  [facility] [address] [place], [place], [address] Phone: [phone]   Fax:  [phone]  Name: LOLLIE GUNNER MRN: [id number] Date of Birth: 10/17/1955

## 2017-11-12 ENCOUNTER — Encounter: Payer: Self-pay | Admitting: Family Medicine

## 2017-11-12 ENCOUNTER — Ambulatory Visit: Payer: 59 | Attending: Anesthesiology | Admitting: Physical Therapy

## 2017-11-12 ENCOUNTER — Ambulatory Visit: Payer: 59 | Admitting: Family Medicine

## 2017-11-12 VITALS — BP 115/68 | HR 59 | Temp 97.4°F | Ht 63.0 in | Wt 215.0 lb

## 2017-11-12 DIAGNOSIS — N3000 Acute cystitis without hematuria: Secondary | ICD-10-CM | POA: Diagnosis not present

## 2017-11-12 DIAGNOSIS — M545 Low back pain: Secondary | ICD-10-CM | POA: Diagnosis present

## 2017-11-12 DIAGNOSIS — G8929 Other chronic pain: Secondary | ICD-10-CM | POA: Diagnosis present

## 2017-11-12 DIAGNOSIS — R55 Syncope and collapse: Secondary | ICD-10-CM | POA: Diagnosis not present

## 2017-11-12 DIAGNOSIS — M546 Pain in thoracic spine: Secondary | ICD-10-CM | POA: Diagnosis present

## 2017-11-12 DIAGNOSIS — Z7689 Persons encountering health services in other specified circumstances: Secondary | ICD-10-CM

## 2017-11-12 DIAGNOSIS — E042 Nontoxic multinodular goiter: Secondary | ICD-10-CM

## 2017-11-12 LAB — MICROSCOPIC EXAMINATION: RBC, UA: NONE SEEN /hpf (ref 0–?)

## 2017-11-12 LAB — URINALYSIS, COMPLETE
BILIRUBIN UA: NEGATIVE
GLUCOSE, UA: NEGATIVE
KETONES UA: NEGATIVE
Nitrite, UA: NEGATIVE
PROTEIN UA: NEGATIVE
RBC UA: NEGATIVE
SPEC GRAV UA: 1.01 (ref 1.005–1.030)
Urobilinogen, Ur: 0.2 mg/dL (ref 0.2–1.0)
pH, UA: 7.5 (ref 5.0–7.5)

## 2017-11-12 MED ORDER — CEPHALEXIN 500 MG PO CAPS: 500.0000 mg | ORAL_CAPSULE | Freq: Two times a day (BID) | ORAL | 0 refills | Status: DC

## 2017-11-12 MED ORDER — FLUCONAZOLE 150 MG PO TABS: 150.0000 mg | ORAL_TABLET | Freq: Once | ORAL | 0 refills | Status: AC

## 2017-11-12 NOTE — Patient Instructions (Signed)
I referred you to cardiology in [place].  He will be contacted with an appointment.  If you do not hear within the next week, please call me.  We discussed the risks of prolonged benzodiazepine use, especially in the setting of pain medication use.  I want you to cut either your morning or your evening dose of Klonopin in half and follow-up with me in 1 month for further wean off of this medication.   Syncope Syncope is when you lose temporarily pass out (faint). Signs that you may be about to pass out include:  Feeling dizzy or light-headed.  Feeling sick to your stomach (nauseous).  Seeing all white or all black.  Having cold, clammy skin.  If you passed out, get help right away. Call your local emergency services (911 in the [place] Do not drive yourself to the hospital. Follow these instructions at home: Pay attention to any changes in your symptoms. Take these actions to help with your condition:  Have someone stay with you until you feel stable.  Do not drive, use machinery, or play sports until your doctor says it is okay.  Keep all follow-up visits as told by your doctor. This is important.  If you start to feel like you might pass out, lie down right away and raise (elevate) your feet above the level of your heart. Breathe deeply and steadily. Wait until all of the symptoms are gone.  Drink enough fluid to keep your pee (urine) clear or pale yellow.  If you are taking blood pressure or heart medicine, get up slowly and spend many minutes getting ready to sit and then stand. This can help with dizziness.  Take over-the-counter and prescription medicines only as told by your doctor.  Get help right away if:  You have a very bad headache.  You have unusual pain in your chest, tummy, or back.  You are bleeding from your mouth or rectum.  You have black or tarry poop (stool).  You have a very fast or uneven heartbeat (palpitations).  It hurts to breathe.  You pass out  once or more than once.  You have jerky movements that you cannot control (seizure).  You are confused.  You have trouble walking.  You are very weak.  You have vision problems. These symptoms may be an emergency. Do not wait to see if the symptoms will go away. Get medical help right away. Call your local emergency services (911 in the [place] Do not drive yourself to the hospital. This information is not intended to replace advice given to you by your health care provider. Make sure you discuss any questions you have with your health care provider. Document Released: 02/13/2008 Document Revised: 02/02/2016 Document Reviewed: 05/11/2015 Elsevier Interactive Patient Education  [employer].

## 2017-11-12 NOTE — Therapy (Addendum)
[facility] [place], [place], [address] Phone: [phone]   Fax:  [phone]  Physical Therapy Treatment  Patient Details  Name: Rebecca Scott MRN: [id number] Date of Birth: 03-04-1956 Referring Provider: Barnet Glasgow   Encounter Date: 11/12/2017  PT End of Session - 11/12/17 1304    Visit Number  3    Number of Visits  12    Date for PT Re-Evaluation  12/17/17    PT Start Time  1301    PT Stop Time  1350    PT Time Calculation (min)  49 min    Activity Tolerance  Patient limited by pain;Patient tolerated treatment well    Behavior During Therapy  [facility] for tasks assessed/performed       Past Medical History:  Diagnosis Date  . Anemia   . Asthma    seasonal asthma rarely   . Chronic kidney disease    renal mass right kidney   . CPAP (continuous positive airway pressure) dependence   . Diverticulitis   . GERD (gastroesophageal reflux disease)   . Hemorrhoids   . Hyperlipemia   . Hypertension   . Migraine   . 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 PONV (postoperative nausea and vomiting)    patient reports being "slower to wake than average "   . Pre-diabetes    "ive been told i'm borderline"   . Sleep apnea    CPAP use     Past Surgical History:  Procedure Laterality Date  . ABDOMINAL HYSTERECTOMY    . APPENDECTOMY  1978  . BIOPSY  07/08/2017   Procedure: BIOPSY;  Surgeon: Rogene Houston, MD;  [facility];  Service: Endoscopy;;  gastric  . CHOLECYSTECTOMY  2005  . COLONOSCOPY WITH PROPOFOL N/A 10/28/2017   Procedure: COLONOSCOPY WITH PROPOFOL;  Surgeon: Rogene Houston, MD;  [facility];  Service: Endoscopy;  Laterality: N/A;  7:30  . ESOPHAGOGASTRODUODENOSCOPY N/A 07/08/2017   Procedure: ESOPHAGOGASTRODUODENOSCOPY (EGD);  Surgeon: Rogene Houston, MD;  [facility];  Service: Endoscopy;  Laterality: N/A;  220  . NASAL SEPTUM  SURGERY    . ROBOTIC ASSITED PARTIAL NEPHRECTOMY Right 05/17/2017   Procedure: XI ROBOTIC ASSITED PARTIAL NEPHRECTOMY;  Surgeon: Alexis Frock, MD;  [facility];  Service: Urology;  Laterality: Right;    There were no vitals filed for this visit.  Subjective Assessment - 11/12/17 1302    Subjective  Patient reported feeling "okay." Pain is about 6/10 both in thoracic spine, around right side to xiphoid process and low back.    Pertinent History  Partial kidney removal (2018); knee pain.    Limitations  Sitting    How long can you sit comfortably?  15 minutes.    How long can you walk comfortably?  Short community distances.    Diagnostic tests  Multilevel lumbar DDD.    Currently in Pain?  Yes    Pain Score  6     Pain Location  Back    Pain Orientation  Right;Mid    Pain Descriptors / Indicators  Sore    Pain Type  Acute pain         [facility] PT Assessment - 11/12/17 0001      Assessment   Medical Diagnosis  Thoracic and low back pain.      Precautions   Precautions  None  [facility] Adult PT Treatment/Exercise - 11/12/17 0001      Exercises   Exercises  Lumbar      Lumbar Exercises: Stretches   Single Knee to Chest Stretch  Right;Left;4 reps;20 seconds    Lower Trunk Rotation  4 reps;20 seconds      Lumbar Exercises: Aerobic   UBE (Upper Arm Bike)  Level 3 x8 minutes; seat 7      Lumbar Exercises: Standing   Row  Both;20 reps with emphasis on draw in    Row Limitations  Pink XTS      Modalities   Modalities  Electrical Stimulation;Moist Heat      Manual Therapy   Manual Therapy  Soft tissue mobilization    Manual therapy comments  STW/M to right low back/QL and thoracic paraspinals and right lateral back in left sidelying     Joint Mobilization  --      Addendum: No UBE performed, NuStep Level 3 x8 minutes seat 7             PT Long Term Goals - 11/05/17 1732      PT LONG TERM GOAL #1   Title  Independent with a HEP.     Time  6    Period  Weeks    Status  New      PT LONG TERM GOAL #2   Title  Sit 30 minutes with pain not > 3/10.    Time  6    Period  Weeks    Status  New      PT LONG TERM GOAL #3   Title  Walk a community distance with pain not > 3/10.    Time  6    Period  Weeks    Status  New      PT LONG TERM GOAL #4   Title  Perform ADL's with pain not > 3/10.    Time  6    Period  Weeks    Status  New            Plan - 11/12/17 1743    Clinical Impression Statement  Patient continues to be limited with pain during some ther-ex. Patient stated rows did not cause pain in throacic spine but caused "tightness" in low back. Pt provided with multiple rest breaks to alleviate low back pain. PT instructed pt to perfrom lower trunk rotations and single knee to chest stretchses 2x daily to help stretch low back and glute musculature. PT and pt discussed trying to perfrom rows seated with lumbar support next visit. Patient tender to palpation at right QL and along the lateral aspect of the mid back. Patient noted with a decrease of pain after STW/M 5/10. Patient declined e-stim due to needing to go to another doctor's appointment.    Clinical Presentation  Stable    Clinical Decision Making  Low    Rehab Potential  Good    PT Frequency  2x / week    PT Duration  6 weeks    PT Treatment/Interventions  ADLs/Self Care Home Management;Ultrasound;Traction;Moist Heat;Therapeutic activities;Therapeutic exercise;Patient/family education;Manual techniques;Dry needling    PT Next Visit Plan  Modalited as needed.  Right thoracic and costo-vertebral mobs; STW/M to right low back musculature.  Scapular strengthening and low back/core exercises.    Consulted and Agree with Plan of Care  Patient       Patient will benefit from skilled therapeutic intervention in order to improve the following deficits and impairments:  Decreased activity tolerance, Decreased range of motion, Pain  Visit Diagnosis: Pain  in thoracic spine  Chronic right-sided low back pain without sciatica     Problem List Patient Active Problem List   Diagnosis Date Noted  . Reactive depression 10/22/2017  . RUQ abdominal pain 10/07/2017  . Change in bowel habits 10/07/2017  . Multiple thyroid nodules 08/21/2017  . Prediabetes 08/21/2017  . Right upper quadrant pain 07/05/2017  . Gastroesophageal reflux disease without esophagitis 07/05/2017  . HTN (hypertension) 07/29/2014  . Hyperlipidemia 07/29/2014  . Syncope 07/29/2014  . Mixed stress and urge urinary incontinence 01/14/2014  . Recurrent UTI 01/14/2014  . Vaginal atrophy 01/14/2014  . Benign lesion of eyelid 02/07/2012   Gabriela Eves, PT, DPT 11/12/2017, 5:52 PM  [facility] [address] [place], [place], [address] Phone: [phone]   Fax:  [phone]  Name: Rebecca Scott MRN: [id number] Date of Birth: 1956/02/22

## 2017-11-12 NOTE — Progress Notes (Signed)
Subjective: XN:[id number] care, Dysuria/ Syncope/ chronic pain HPI: Rebecca Scott is a 62 y.o. female presenting to clinic today for:  1. Urinary symptoms Patient reports a several day h/o urinary frequency and urgency .  Denies hematuria, fevers, chills, nausea, vomiting, vaginal discharge.  She has had chronic back pain since the partial nephrectomy.  2.  Syncope Patient reports history of syncope since 2015.  There is no prodrome.  She notes that this is occurred at least 2 or 3 times over the last year with the last episode being in December of this year.  These episodes have been witnessed.  She thinks that she is typically out for about 5-10 minutes.  Denies bowel or bladder incontinence during episodes.  No observed seizure-like activity.  She actually had a 48-hour Holter monitor performed in the past but this was unremarkable.  Her previous provider was supposed to refer her to cardiology for further assessment and consideration of 30-day monitor but she said she never received a call for an appointment.  She does note increased stress.  She reports that she intermittently has rib cage pain after the partial nephrectomy but never had chest pain, heart palpitations, shortness of breath, dizziness, visual disturbance, weakness with syncopal episodes.  3.  Chronic pain Patiently is under the care of pain management for pain in her right back, right side and underneath the right anterior rib cage.  She notes that this onset after her partial kidney removal.  She recently saw pain management who increased her dose of gabapentin to 300 mg 3 times daily.  She notes very rare use of the Norco, as it causes sedation and she does not like how it makes her feel.  She does report slight improvement on the gabapentin increase but pain continues to be significant and constant.  She wonders if her pain will ever be controlled.  She is afraid that pain management will not be able to help her and "give up  on her.  Denies associated nausea, vomiting.  Additionally, she wonders if this could be "an internal" shingles.  She denies current rash but does note that she had a rash immediately following surgery in the right upper quadrant.  This is totally resolved.  4.  Anxiety Patient reports that she was started on Klonopin daily after her husband became ill.  She reports that dose was increased later when her mother became ill and then she is continued it because she had recently been treated for a kidney cancer.  She does not want to be on this medication if possible, citing that she was never told that the medication could have adverse effects, particularly as she grows older and in the conjunction with opioid medications.  No ETOH use.  5.  Thyroid nodules Patient reports that she was told that she had multiple thyroid nodules found incidentally on imaging when she is being worked up for kidney cancer.  She notes that she has had a thyroid ultrasound and is being followed by endocrinology.  At this point, there are no plans for biopsy of any of the nodules.  She voices concern over this because she has history of renal carcinoma.  She notes that the lesions on the neck seem to be "getting bigger" and they are now tender.   Past Medical History:  Diagnosis Date  . Anemia   . Asthma    seasonal asthma rarely   . Chronic kidney disease    renal mass right kidney   .  CPAP (continuous positive airway pressure) dependence   . Diverticulitis   . GERD (gastroesophageal reflux disease)   . Hemorrhoids   . Hyperlipemia   . Hypertension   . Migraine   . PONV (postoperative nausea and vomiting)    patient reports being "slower to wake than average "   . Pre-diabetes    "ive been told i'm borderline"   . Sleep apnea    CPAP use    Past Surgical History:  Procedure Laterality Date  . ABDOMINAL HYSTERECTOMY    . APPENDECTOMY  1978  . BIOPSY  07/08/2017   Procedure: BIOPSY;  Surgeon: Rogene Houston, MD;  [facility];  Service: Endoscopy;;  gastric  . CHOLECYSTECTOMY  2005  . COLONOSCOPY WITH PROPOFOL N/A 10/28/2017   Procedure: COLONOSCOPY WITH PROPOFOL;  Surgeon: Rogene Houston, MD;  [facility];  Service: Endoscopy;  Laterality: N/A;  7:30  . ESOPHAGOGASTRODUODENOSCOPY N/A 07/08/2017   Procedure: ESOPHAGOGASTRODUODENOSCOPY (EGD);  Surgeon: Rogene Houston, MD;  [facility];  Service: Endoscopy;  Laterality: N/A;  220  . NASAL SEPTUM SURGERY    . ROBOTIC ASSITED PARTIAL NEPHRECTOMY Right 05/17/2017   Procedure: XI ROBOTIC ASSITED PARTIAL NEPHRECTOMY;  Surgeon: Alexis Frock, MD;  [facility]: [facility];  Service: Urology;  Laterality: Right;   Social History   Socioeconomic History  . Marital status: Widowed    Spouse name: Not on file  . Number of children: Not on file  . Years of education: Not on file  . Highest education level: Not on file  Social Needs  . Financial resource strain: Not on file  . Food insecurity - worry: Not on file  . Food insecurity - inability: Not on file  . Transportation needs - medical: Not on file  . Transportation needs - non-medical: Not on file  Occupational History  . Not on file  Tobacco Use  . Smoking status: Never Smoker  . Smokeless tobacco: Never Used  Substance and Sexual Activity  . Alcohol use: Yes    Comment: rarely   . Drug use: No  . Sexual activity: Not Currently  Other Topics Concern  . Not on file  Social History Narrative  . Not on file   Current Meds  Medication Sig  . aspirin EC 81 MG tablet Take 1 tablet (81 mg total) by mouth daily.  . clonazePAM (KLONOPIN) 0.5 MG tablet Take 0.5 mg by mouth 2 (two) times daily.   . DULoxetine (CYMBALTA) 20 MG capsule Take 20 mg by mouth 2 (two) times daily.  Marland Kitchen gabapentin (NEURONTIN) 100 MG capsule Take 300 mg by mouth 3 (three) times daily.   Marland Kitchen HYDROcodone-acetaminophen (NORCO/VICODIN) 5-325 MG tablet Take 1-2 tablets by mouth every 6 (six)  hours as needed for moderate pain. (Patient taking differently: Take 1 tablet by mouth daily as needed for moderate pain. )  . lidocaine (LIDODERM) 5 % Place 1 patch onto the skin daily. Remove & Discard patch within 12 hours or as directed by MD (Patient taking differently: Place 1 patch onto the skin daily as needed (pain). Remove & Discard patch within 12 hours or as directed by MD)  . loratadine (CLARITIN) 10 MG tablet Take 10 mg by mouth daily as needed for allergies.  Marland Kitchen losartan-hydrochlorothiazide (HYZAAR) 100-25 MG tablet Take 1 tablet by mouth daily.  . ondansetron (ZOFRAN ODT) 4 MG disintegrating tablet Take 1 tablet (4 mg total) every 8 (eight) hours as needed by mouth for  nausea or vomiting. 4mg  ODT q4 hours prn nausea/vomit  . pantoprazole (PROTONIX) 40 MG tablet Take 1 tablet (40 mg total) by mouth 2 (two) times daily before a meal.  . potassium chloride SA (K-DUR,KLOR-CON) 20 MEQ tablet Take 20 mEq by mouth daily.   . rosuvastatin (CRESTOR) 5 MG tablet Take 5 mg by mouth daily.    Family History  Problem Relation Age of Onset  . COPD Mother   . Diabetes Mother   . Hypertension Mother   . High Cholesterol Mother   . COPD Sister   . Hypertension Brother   . High Cholesterol Brother   . Hypertension Brother   . Asthma Son    Allergies  Allergen Reactions  . Iodinated Diagnostic Agents Hives and Itching  . Isovue [Iopamidol] Itching    And hives   . Nitrofurantoin Hives  . Sulfa Antibiotics Hives  . Adhesive [Tape] Rash and Other (See Comments)    Paper tape ONLY  . Mango Flavor Swelling and Rash    Tongue swelling, feels like throat is closing in on her    ROS: Per HPI  Objective: Office vital signs reviewed. BP 115/68   Pulse (!) 59   Temp (!) 97.4 F (36.3 C) (Oral)   Ht 5\' 3"  (1.6 m)   Wt 215 lb (97.5 kg)   BMI 38.09 kg/m   Physical Examination:  General: Awake, alert, well nourished, nontoxic, No acute distress HEENT: sclera white, MMM, EOMI, no  exophthalmos. Neck: Thyroid full feeling.  No discrete goiter or thyroid nodules palpated. Cardio: regular rate and rhythm, S1S2 heard, no murmurs appreciated Pulm: clear to auscultation bilaterally, no wheezes, rhonchi or rales; normal work of breathing on room air MSK: normal gait and normal station Skin: dry; intact; no rashes or lesions; appropriate temperature. Neuro: Follows commands.  No focal neurologic deficits.  No resting tremor noted. Psych: Mood stable, speech normal, affect appropriate, engaging, good eye contact, does not appear to be responding to internal stimuli. Depression screen PHQ 2/9 11/12/2017  Decreased Interest 1  Down, Depressed, Hopeless 1  PHQ - 2 Score 2  Altered sleeping 3  Tired, decreased energy 2  Change in appetite 0  Feeling bad or failure about yourself  0  Trouble concentrating 1  Moving slowly or fidgety/restless 0  Suicidal thoughts 0  PHQ-9 Score 8   No flowsheet data found.   Assessment/ Plan: 62 y.o. female   1. Acute cystitis without hematuria Urinalysis with trace leukocytes.  Urine microscopy with 3-10 white blood cells and few bacteria.  Urine sent for culture.  Given symptoms, will treat with antibiotics for UTI.  She is afebrile well-appearing with no evidence of pyelonephritis.  Home care instructions reviewed with the patient.  She voiced good understanding. - Urinalysis, Complete - Urine Culture  2. Syncope, unspecified syncope type Does not sound like seizure-like activity.  Possibly vasovagal in nature given stressful situations surrounding episodes.  Per her report, this is never been worked up with the exception of a 48-hour Holter monitor.  Given its chronicity, I do not think it needs emergent evaluation in the emergency department.  However, she would probably benefit from a 30-day cardiac monitor and cardiac ultrasound.  For this reason, I have referred her to cardiology.  Reasons for emergent evaluation emergency department  discussed with the patient.  She was good understanding and follow-up as needed. - Ambulatory referral to Cardiology  3. Encounter to establish care with new doctor Release of information  form completed.  Will await records from previous PCP, Dr Willey Blade  4. Multiple thyroid nodules Currently under the care of endocrinology.  Will cc today's note to them that they are aware of patient's concerns with regards to nodules and will address biopsy with patient.  I suspect that the nodules did not meet criteria for biopsy.  I did discuss this with the patient.  We will continue to follow along and assist as able.   Janora Norlander, DO [place] [phone]

## 2017-11-13 LAB — URINE CULTURE

## 2017-11-14 ENCOUNTER — Ambulatory Visit: Payer: 59 | Admitting: Physical Therapy

## 2017-11-14 DIAGNOSIS — M546 Pain in thoracic spine: Secondary | ICD-10-CM

## 2017-11-14 DIAGNOSIS — G8929 Other chronic pain: Secondary | ICD-10-CM

## 2017-11-14 DIAGNOSIS — M545 Low back pain: Secondary | ICD-10-CM

## 2017-11-14 NOTE — Therapy (Signed)
[facility] [place], [place], [address] Phone: [phone]   Fax:  [phone]  Physical Therapy Treatment  Patient Details  Name: Rebecca Scott MRN: [id number] Date of Birth: 1956-03-06 Referring Provider: Barnet Glasgow   Encounter Date: 11/14/2017  PT End of Session - 11/14/17 1306    Visit Number  4    Number of Visits  12    Date for PT Re-Evaluation  12/17/17    PT Start Time  1300    Activity Tolerance  Patient tolerated treatment well;Patient limited by pain    Behavior During Therapy  [facility] for tasks assessed/performed       Past Medical History:  Diagnosis Date  . Anemia   . Asthma    seasonal asthma rarely   . Chronic kidney disease    renal mass right kidney   . CPAP (continuous positive airway pressure) dependence   . Diverticulitis   . GERD (gastroesophageal reflux disease)   . Hemorrhoids   . Hyperlipemia   . Hypertension   . Migraine   . 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 PONV (postoperative nausea and vomiting)    patient reports being "slower to wake than average "   . Pre-diabetes    "ive been told i'm borderline"   . Sleep apnea    CPAP use     Past Surgical History:  Procedure Laterality Date  . ABDOMINAL HYSTERECTOMY    . APPENDECTOMY  1978  . BIOPSY  07/08/2017   Procedure: BIOPSY;  Surgeon: Rogene Houston, MD;  [facility];  Service: Endoscopy;;  gastric  . CHOLECYSTECTOMY  2005  . COLONOSCOPY WITH PROPOFOL N/A 10/28/2017   Procedure: COLONOSCOPY WITH PROPOFOL;  Surgeon: Rogene Houston, MD;  [facility];  Service: Endoscopy;  Laterality: N/A;  7:30  . ESOPHAGOGASTRODUODENOSCOPY N/A 07/08/2017   Procedure: ESOPHAGOGASTRODUODENOSCOPY (EGD);  Surgeon: Rogene Houston, MD;  [facility];  Service: Endoscopy;  Laterality: N/A;  220  . NASAL SEPTUM SURGERY    . ROBOTIC ASSITED PARTIAL NEPHRECTOMY Right  05/17/2017   Procedure: XI ROBOTIC ASSITED PARTIAL NEPHRECTOMY;  Surgeon: Alexis Frock, MD;  [facility];  Service: Urology;  Laterality: Right;    There were no vitals filed for this visit.  Subjective Assessment - 11/14/17 1304    Subjective  Pain is about an 8/10 today. Patient reported pain was so bad yesterday 9-10/10. Patient took pain medication for relief.    Pertinent History  Partial kidney removal (2018); knee pain.    Limitations  Sitting    How long can you sit comfortably?  15 minutes.    How long can you walk comfortably?  Short community distances.    Diagnostic tests  Multilevel lumbar DDD.    Currently in Pain?  Yes    Pain Score  8     Pain Location  Back    Pain Orientation  Right;Mid    Pain Descriptors / Indicators  Sore    Pain Type  Acute pain         [facility] PT Assessment - 11/14/17 0001      Assessment   Medical Diagnosis  Thoracic and low back pain.      Precautions   Precautions  None                  [facility] Adult PT Treatment/Exercise - 11/14/17 0001  Exercises   Exercises  Lumbar;Shoulder      Lumbar Exercises: Aerobic   UBE (Upper Arm Bike)  6 minutes (3 forward. 3 backward) seat 4      Lumbar Exercises: Supine   Ab Set  20 reps;3 seconds    Bent Knee Raise  10 reps;2 seconds      Shoulder Exercises: Seated   Row  Strengthening;Both;20 reps;Theraband 3 second hold with pillow behind back.     Horizontal ABduction  Strengthening;Both;20 reps;Theraband red theraband      Modalities   Modalities  Electrical Stimulation      Moist Heat Therapy   Number Minutes Moist Heat  10 Minutes    Moist Heat Location  Lumbar Spine and thoracic spine      Electrical Stimulation   Electrical Stimulation Location  mid back and low back    Electrical Stimulation Action  Pre-mod    Electrical Stimulation Parameters  80-150 Hz x10    Electrical Stimulation Goals  Pain      Manual Therapy   Manual Therapy  Soft tissue  mobilization    Manual therapy comments  STW/M to right low back/QL and thoracic paraspinals and in left sidelying                   PT Long Term Goals - 11/05/17 1732      PT LONG TERM GOAL #1   Title  Independent with a HEP.    Time  6    Period  Weeks    Status  New      PT LONG TERM GOAL #2   Title  Sit 30 minutes with pain not > 3/10.    Time  6    Period  Weeks    Status  New      PT LONG TERM GOAL #3   Title  Walk a community distance with pain not > 3/10.    Time  6    Period  Weeks    Status  New      PT LONG TERM GOAL #4   Title  Perform ADL's with pain not > 3/10.    Time  6    Period  Weeks    Status  New            Plan - 11/14/17 1355    Clinical Impression Statement  Patient was able to tolerate exercises today with no increase of pain; pain remained the same. Patient still tender to palpation at right QL. Patient requested to lay down during e-stim and moist heat next visit as sitting aggravates pain. Normal response to modalities upon removal. Patient reported 5/10 pain at end of session.    Clinical Presentation  Stable    Clinical Decision Making  Low    Rehab Potential  Good    PT Frequency  2x / week    PT Duration  6 weeks    PT Treatment/Interventions  ADLs/Self Care Home Management;Ultrasound;Traction;Moist Heat;Therapeutic activities;Therapeutic exercise;Patient/family education;Manual techniques;Dry needling    PT Next Visit Plan  Ultrasound or combo for pain relief. Modalited as needed.  Right thoracic and costo-vertebral mobs; STW/M to right low back musculature.  Scapular strengthening and low back/core exercises.    Consulted and Agree with Plan of Care  Patient       Patient will benefit from skilled therapeutic intervention in order to improve the following deficits and impairments:  Decreased activity tolerance, Decreased range of motion, Pain  Visit  Diagnosis: Pain in thoracic spine  Chronic right-sided low back pain  without sciatica     Problem List Patient Active Problem List   Diagnosis Date Noted  . Reactive depression 10/22/2017  . RUQ abdominal pain 10/07/2017  . Change in bowel habits 10/07/2017  . Multiple thyroid nodules 08/21/2017  . Prediabetes 08/21/2017  . Right upper quadrant pain 07/05/2017  . Gastroesophageal reflux disease without esophagitis 07/05/2017  . HTN (hypertension) 07/29/2014  . Hyperlipidemia 07/29/2014  . Syncope 07/29/2014  . Mixed stress and urge urinary incontinence 01/14/2014  . Recurrent UTI 01/14/2014  . Vaginal atrophy 01/14/2014  . Benign lesion of eyelid 02/07/2012    Gabriela Eves, PT, DPT 11/14/2017, 2:02 PM  [facility] [place], [place], [address] Phone: [phone]   Fax:  [phone]  Name: Rebecca Scott MRN: [id number] Date of Birth: October 29, 1955

## 2017-11-19 ENCOUNTER — Ambulatory Visit: Payer: 59 | Admitting: Physical Therapy

## 2017-11-19 ENCOUNTER — Encounter: Payer: Self-pay | Admitting: Physical Therapy

## 2017-11-19 DIAGNOSIS — G8929 Other chronic pain: Secondary | ICD-10-CM

## 2017-11-19 DIAGNOSIS — M545 Low back pain, unspecified: Secondary | ICD-10-CM

## 2017-11-19 DIAGNOSIS — M546 Pain in thoracic spine: Secondary | ICD-10-CM | POA: Diagnosis not present

## 2017-11-19 NOTE — Therapy (Signed)
[facility] [place], [place], [address] Phone: [phone]   Fax:  [phone]  Physical Therapy Treatment  Patient Details  Name: Rebecca Scott MRN: [id number] Date of Birth: 02/04/1956 Referring Provider: Barnet Glasgow   Encounter Date: 11/19/2017  PT End of Session - 11/19/17 1515    Visit Number  5    Number of Visits  12    Date for PT Re-Evaluation  12/17/17    PT Start Time  7425    PT Stop Time  1605    PT Time Calculation (min)  50 min    Activity Tolerance  Patient tolerated treatment well;Patient limited by pain    Behavior During Therapy  [facility] for tasks assessed/performed       Past Medical History:  Diagnosis Date  . Anemia   . Asthma    seasonal asthma rarely   . Chronic kidney disease    renal mass right kidney   . CPAP (continuous positive airway pressure) dependence   . Diverticulitis   . GERD (gastroesophageal reflux disease)   . Hemorrhoids   . Hyperlipemia   . Hypertension   . Migraine   . 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 PONV (postoperative nausea and vomiting)    patient reports being "slower to wake than average "   . Pre-diabetes    "ive been told i'm borderline"   . Sleep apnea    CPAP use     Past Surgical History:  Procedure Laterality Date  . ABDOMINAL HYSTERECTOMY    . APPENDECTOMY  1978  . BIOPSY  07/08/2017   Procedure: BIOPSY;  Surgeon: Rogene Houston, MD;  [facility];  Service: Endoscopy;;  gastric  . CHOLECYSTECTOMY  2005  . COLONOSCOPY WITH PROPOFOL N/A 10/28/2017   Procedure: COLONOSCOPY WITH PROPOFOL;  Surgeon: Rogene Houston, MD;  [facility];  Service: Endoscopy;  Laterality: N/A;  7:30  . ESOPHAGOGASTRODUODENOSCOPY N/A 07/08/2017   Procedure: ESOPHAGOGASTRODUODENOSCOPY (EGD);  Surgeon: Rogene Houston, MD;  [facility];  Service: Endoscopy;  Laterality: N/A;  220  . NASAL SEPTUM  SURGERY    . ROBOTIC ASSITED PARTIAL NEPHRECTOMY Right 05/17/2017   Procedure: XI ROBOTIC ASSITED PARTIAL NEPHRECTOMY;  Surgeon: Alexis Frock, MD;  [facility];  Service: Urology;  Laterality: Right;    There were no vitals filed for this visit.  Subjective Assessment - 11/19/17 1513    Subjective  Reports pain upon arrival.    Pertinent History  Partial kidney removal (2018); knee pain.    Limitations  Sitting    How long can you sit comfortably?  15 minutes.    How long can you walk comfortably?  Short community distances.    Diagnostic tests  Multilevel lumbar DDD.    Currently in Pain?  Yes    Pain Score  7     Pain Location  Back    Pain Orientation  Mid    Pain Descriptors / Indicators  Discomfort    Pain Type  Acute pain         [facility] PT Assessment - 11/19/17 0001      Assessment   Medical Diagnosis  Thoracic and low back pain.      Precautions   Precautions  None                  [facility] Adult PT Treatment/Exercise - 11/19/17 0001  Lumbar Exercises: Aerobic   UBE (Upper Arm Bike)  6 minutes (3 forward. 3 backward) seat 4      Lumbar Exercises: Supine   Ab Set  20 reps;5 seconds    Glut Set  20 reps;5 seconds    Bent Knee Raise  15 reps;2 seconds "pushing 8/10"      Shoulder Exercises: Standing   Horizontal ABduction  Strengthening;Both;10 reps;Theraband    Theraband Level (Shoulder Horizontal ABduction)  Level 1 (Yellow)    External Rotation  Strengthening;Both;10 reps;Theraband    Theraband Level (Shoulder External Rotation)  Level 1 (Yellow)    Row  Strengthening;Both;10 reps;Theraband    Theraband Level (Shoulder Row)  Level 1 (Yellow)    Other Standing Exercises  B snow angel limited to 5 reps secondary to pain      Modalities   Modalities  Electrical Stimulation;Moist Heat      Moist Heat Therapy   Number Minutes Moist Heat  15 Minutes    Moist Heat Location  Lumbar Spine      Electrical Stimulation   Electrical Stimulation  Location  B thoracolumbar paraspinals    Electrical Stimulation Action  IFC    Electrical Stimulation Parameters  1-10 hz x15 min    Electrical Stimulation Goals  Pain      Manual Therapy   Manual Therapy  Soft tissue mobilization    Soft tissue mobilization  STW/MFR to B thoracolumbar paraspinals, QL to reduce muscle tightness in prone over 2 pillows                  PT Long Term Goals - 11/05/17 1732      PT LONG TERM GOAL #1   Title  Independent with a HEP.    Time  6    Period  Weeks    Status  New      PT LONG TERM GOAL #2   Title  Sit 30 minutes with pain not > 3/10.    Time  6    Period  Weeks    Status  New      PT LONG TERM GOAL #3   Title  Walk a community distance with pain not > 3/10.    Time  6    Period  Weeks    Status  New      PT LONG TERM GOAL #4   Title  Perform ADL's with pain not > 3/10.    Time  6    Period  Weeks    Status  New            Plan - 11/19/17 1618    Clinical Impression Statement  Patient tolerated today's treatment fairly well as she intermittantly reported discomfort in mid to low back. Patient unable to tolerate snow angels at wall as she encountered increased pain and block of motion. Patient able to complete other light postural exercises with light resistance and light reps as well. Patient guided through low level core/lumbar strengthening as well with only reports of LBP with supine marching. Reported ab set as "feeling good." Patient positioned in prone over two pillows per patient approval for STW. Increased muscle tightness palpated in B lumbar paraspinals and QL. Normal modalities response noted following removal of the modalities. Patient reported 3.5/10 back pain following end of treatment.    Rehab Potential  Good    PT Frequency  2x / week    PT Duration  6 weeks    PT Treatment/Interventions  ADLs/Self Care Home Management;Ultrasound;Traction;Moist Heat;Therapeutic activities;Therapeutic  exercise;Patient/family education;Manual techniques;Dry needling    PT Next Visit Plan  Ultrasound or combo for pain relief. Modalited as needed.  Right thoracic and costo-vertebral mobs; STW/M to right low back musculature.  Scapular strengthening and low back/core exercises.    Consulted and Agree with Plan of Care  Patient       Patient will benefit from skilled therapeutic intervention in order to improve the following deficits and impairments:  Decreased activity tolerance, Decreased range of motion, Pain  Visit Diagnosis: Pain in thoracic spine  Chronic right-sided low back pain without sciatica     Problem List Patient Active Problem List   Diagnosis Date Noted  . Reactive depression 10/22/2017  . RUQ abdominal pain 10/07/2017  . Change in bowel habits 10/07/2017  . Multiple thyroid nodules 08/21/2017  . Prediabetes 08/21/2017  . Right upper quadrant pain 07/05/2017  . Gastroesophageal reflux disease without esophagitis 07/05/2017  . HTN (hypertension) 07/29/2014  . Hyperlipidemia 07/29/2014  . Syncope 07/29/2014  . Mixed stress and urge urinary incontinence 01/14/2014  . Recurrent UTI 01/14/2014  . Vaginal atrophy 01/14/2014  . Benign lesion of eyelid 02/07/2012    Standley Brooking, PTA 11/19/2017, 5:08 PM  [facility] [place], [place], [address] Phone: [phone]   Fax:  [phone]  Name: Rebecca Scott MRN: [id number] Date of Birth: July 30, 1956

## 2017-11-21 ENCOUNTER — Ambulatory Visit: Payer: 59 | Admitting: Physical Therapy

## 2017-11-21 ENCOUNTER — Encounter: Payer: Self-pay | Admitting: Physical Therapy

## 2017-11-21 DIAGNOSIS — M546 Pain in thoracic spine: Secondary | ICD-10-CM | POA: Diagnosis not present

## 2017-11-21 DIAGNOSIS — G8929 Other chronic pain: Secondary | ICD-10-CM

## 2017-11-21 DIAGNOSIS — M545 Low back pain: Secondary | ICD-10-CM

## 2017-11-21 NOTE — Therapy (Signed)
[facility] [place], [place], [address] Phone: [phone]   Fax:  [phone]  Physical Therapy Treatment  Patient Details  Name: Rebecca Scott MRN: [id number] Date of Birth: 03-05-1956 Referring Provider: Barnet Glasgow   Encounter Date: 11/21/2017  PT End of Session - 11/21/17 1523    Visit Number  6    Number of Visits  12    Date for PT Re-Evaluation  12/17/17    PT Start Time  8563    PT Stop Time  1610    PT Time Calculation (min)  53 min    Activity Tolerance  Patient tolerated treatment well;Patient limited by pain    Behavior During Therapy  [facility] for tasks assessed/performed       Past Medical History:  Diagnosis Date  . Anemia   . Asthma    seasonal asthma rarely   . Chronic kidney disease    renal mass right kidney   . CPAP (continuous positive airway pressure) dependence   . Diverticulitis   . GERD (gastroesophageal reflux disease)   . Hemorrhoids   . Hyperlipemia   . Hypertension   . Migraine   . 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 PONV (postoperative nausea and vomiting)    patient reports being "slower to wake than average "   . Pre-diabetes    "ive been told i'm borderline"   . Sleep apnea    CPAP use     Past Surgical History:  Procedure Laterality Date  . ABDOMINAL HYSTERECTOMY    . APPENDECTOMY  1978  . BIOPSY  07/08/2017   Procedure: BIOPSY;  Surgeon: Rogene Houston, MD;  [facility];  Service: Endoscopy;;  gastric  . CHOLECYSTECTOMY  2005  . COLONOSCOPY WITH PROPOFOL N/A 10/28/2017   Procedure: COLONOSCOPY WITH PROPOFOL;  Surgeon: Rogene Houston, MD;  [facility];  Service: Endoscopy;  Laterality: N/A;  7:30  . ESOPHAGOGASTRODUODENOSCOPY N/A 07/08/2017   Procedure: ESOPHAGOGASTRODUODENOSCOPY (EGD);  Surgeon: Rogene Houston, MD;  [facility];  Service: Endoscopy;  Laterality: N/A;  220  . NASAL SEPTUM  SURGERY    . ROBOTIC ASSITED PARTIAL NEPHRECTOMY Right 05/17/2017   Procedure: XI ROBOTIC ASSITED PARTIAL NEPHRECTOMY;  Surgeon: Alexis Frock, MD;  [facility]: [facility];  Service: Urology;  Laterality: Right;    There were no vitals filed for this visit.  Subjective Assessment - 11/21/17 1521    Subjective  Feels "so so."    Pertinent History  Partial kidney removal (2018); knee pain.    Limitations  Sitting    How long can you sit comfortably?  15 minutes.    How long can you walk comfortably?  Short community distances.    Diagnostic tests  Multilevel lumbar DDD.    Currently in Pain?  Yes    Pain Score  6     Pain Location  Back    Pain Orientation  Mid    Pain Descriptors / Indicators  Discomfort    Pain Type  Acute pain         [facility] PT Assessment - 11/21/17 0001      Assessment   Medical Diagnosis  Thoracic and low back pain.      Precautions   Precautions  None                  [facility] Adult PT Treatment/Exercise - 11/21/17 0001  Lumbar Exercises: Aerobic   UBE (Upper Arm Bike)  120 RPM x6 min (3 min forward, 3 min backward)    Nustep  L6 x10 min      Lumbar Exercises: Supine   Glut Set  20 reps;5 seconds    Clam  15 reps yellow theraband    Other Supine Lumbar Exercises  Mini crunch x15 reps      Shoulder Exercises: Standing   Horizontal ABduction  Strengthening;Both;10 reps;Theraband    Theraband Level (Shoulder Horizontal ABduction)  Level 1 (Yellow)    External Rotation  Strengthening;Both;10 reps;Theraband    Theraband Level (Shoulder External Rotation)  Level 1 (Yellow)    Row  Strengthening;Both;10 reps;Theraband    Theraband Level (Shoulder Row)  Level 1 (Yellow)      Modalities   Modalities  Electrical Stimulation;Moist Heat      Moist Heat Therapy   Number Minutes Moist Heat  15 Minutes    Moist Heat Location  Lumbar Spine      Electrical Stimulation   Electrical Stimulation Location  B thoracolumbar paraspinals    Electrical  Stimulation Action  IFC    Electrical Stimulation Parameters  80-150 hz x15 min    Electrical Stimulation Goals  Pain      Manual Therapy   Manual Therapy  Soft tissue mobilization    Soft tissue mobilization  STW/MFR to B thoracolumbar paraspinals, QL to reduce muscle tightness in prone over 2 pillows                  PT Long Term Goals - 11/05/17 1732      PT LONG TERM GOAL #1   Title  Independent with a HEP.    Time  6    Period  Weeks    Status  New      PT LONG TERM GOAL #2   Title  Sit 30 minutes with pain not > 3/10.    Time  6    Period  Weeks    Status  New      PT LONG TERM GOAL #3   Title  Walk a community distance with pain not > 3/10.    Time  6    Period  Weeks    Status  New      PT LONG TERM GOAL #4   Title  Perform ADL's with pain not > 3/10.    Time  6    Period  Weeks    Status  New            Plan - 11/21/17 1558    Clinical Impression Statement  Patient tolerated today's treatment fairly well as she reported mid level back pain. Patient able to tolerate postural exercises fairly well with no increased pain reports. Greatest complaint being of LBP today following supine resisted clams. Increases muscle tightness palpated in B low back and no reports of any increased pain during manual therapy in prone over two pillows. Normal modalities response noted following removal of the modalities    Rehab Potential  Good    PT Frequency  2x / week    PT Duration  6 weeks    PT Treatment/Interventions  ADLs/Self Care Home Management;Ultrasound;Traction;Moist Heat;Therapeutic activities;Therapeutic exercise;Patient/family education;Manual techniques;Dry needling    PT Next Visit Plan  Patient wishes to try traction during next treatment.    Consulted and Agree with Plan of Care  Patient       Patient will benefit from skilled therapeutic  intervention in order to improve the following deficits and impairments:  Decreased activity tolerance,  Decreased range of motion, Pain  Visit Diagnosis: Pain in thoracic spine  Chronic right-sided low back pain without sciatica     Problem List Patient Active Problem List   Diagnosis Date Noted  . Reactive depression 10/22/2017  . RUQ abdominal pain 10/07/2017  . Change in bowel habits 10/07/2017  . Multiple thyroid nodules 08/21/2017  . Prediabetes 08/21/2017  . Right upper quadrant pain 07/05/2017  . Gastroesophageal reflux disease without esophagitis 07/05/2017  . HTN (hypertension) 07/29/2014  . Hyperlipidemia 07/29/2014  . Syncope 07/29/2014  . Mixed stress and urge urinary incontinence 01/14/2014  . Recurrent UTI 01/14/2014  . Vaginal atrophy 01/14/2014  . Benign lesion of eyelid 02/07/2012    Standley Brooking, PTA 11/21/2017, 6:25 PM  [facility] [place], [place], [address] Phone: [phone]   Fax:  [phone]  Name: MADICYN MESINA MRN: [id number] Date of Birth: April 23, 1956

## 2017-11-26 ENCOUNTER — Encounter: Payer: Self-pay | Admitting: Physical Therapy

## 2017-11-26 ENCOUNTER — Ambulatory Visit: Payer: 59 | Admitting: Physical Therapy

## 2017-11-26 DIAGNOSIS — M546 Pain in thoracic spine: Secondary | ICD-10-CM

## 2017-11-26 DIAGNOSIS — M545 Low back pain, unspecified: Secondary | ICD-10-CM

## 2017-11-26 DIAGNOSIS — G8929 Other chronic pain: Secondary | ICD-10-CM

## 2017-11-26 NOTE — Therapy (Signed)
[facility] [place], [place], [address] Phone: [phone]   Fax:  [phone]  Physical Therapy Treatment  Patient Details  Name: Rebecca Scott MRN: [id number] Date of Birth: 19-Jun-1956 Referring Provider: Barnet Glasgow   Encounter Date: 11/26/2017  PT End of Session - 11/26/17 1522    Visit Number  7    Number of Visits  12    Date for PT Re-Evaluation  12/17/17    PT Start Time  6962    PT Stop Time  1610    PT Time Calculation (min)  53 min    Activity Tolerance  Patient tolerated treatment well    Behavior During Therapy  [facility] for tasks assessed/performed       Past Medical History:  Diagnosis Date  . Anemia   . Asthma    seasonal asthma rarely   . Chronic kidney disease    renal mass right kidney   . CPAP (continuous positive airway pressure) dependence   . Diverticulitis   . GERD (gastroesophageal reflux disease)   . Hemorrhoids   . Hyperlipemia   . Hypertension   . Migraine   . 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 PONV (postoperative nausea and vomiting)    patient reports being "slower to wake than average "   . Pre-diabetes    "ive been told i'm borderline"   . Sleep apnea    CPAP use     Past Surgical History:  Procedure Laterality Date  . ABDOMINAL HYSTERECTOMY    . APPENDECTOMY  1978  . BIOPSY  07/08/2017   Procedure: BIOPSY;  Surgeon: Rogene Houston, MD;  [facility];  Service: Endoscopy;;  gastric  . CHOLECYSTECTOMY  2005  . COLONOSCOPY WITH PROPOFOL N/A 10/28/2017   Procedure: COLONOSCOPY WITH PROPOFOL;  Surgeon: Rogene Houston, MD;  [facility];  Service: Endoscopy;  Laterality: N/A;  7:30  . ESOPHAGOGASTRODUODENOSCOPY N/A 07/08/2017   Procedure: ESOPHAGOGASTRODUODENOSCOPY (EGD);  Surgeon: Rogene Houston, MD;  [facility];  Service: Endoscopy;  Laterality: N/A;  220  . NASAL SEPTUM SURGERY    . ROBOTIC  ASSITED PARTIAL NEPHRECTOMY Right 05/17/2017   Procedure: XI ROBOTIC ASSITED PARTIAL NEPHRECTOMY;  Surgeon: Alexis Frock, MD;  [facility];  Service: Urology;  Laterality: Right;    There were no vitals filed for this visit.  Subjective Assessment - 11/26/17 1521    Subjective  Reports that today has been "a fair day." LBP is usually even but R can be worse. The mid back pain is just across R side.    Pertinent History  Partial kidney removal (2018); knee pain.    Limitations  Sitting    How long can you sit comfortably?  15 minutes.    How long can you walk comfortably?  Short community distances.    Diagnostic tests  Multilevel lumbar DDD.    Currently in Pain?  Yes    Pain Score  5     Pain Location  Back    Pain Orientation  Mid;Lower    Pain Descriptors / Indicators  Stabbing    Pain Type  Acute pain         [facility] PT Assessment - 11/26/17 0001      Assessment   Medical Diagnosis  Thoracic and low back pain.    Next MD Visit  01/2018      Precautions   Precautions  None                  [facility] Adult PT Treatment/Exercise - 11/26/17 0001      Lumbar Exercises: Aerobic   UBE (Upper Arm Bike)  90 RPM x6 min (3 min forward, 3 min backward)    Nustep  L4 x10 min      Lumbar Exercises: Supine   Clam  20 reps;Limitations    Clam Limitations  yellow theraband    Bent Knee Raise  15 reps    Bridge  10 reps LBP rated 7/10    Other Supine Lumbar Exercises  Mini crunch x15 reps      Shoulder Exercises: Standing   Horizontal ABduction  Strengthening;Both;15 reps;Theraband    Theraband Level (Shoulder Horizontal ABduction)  Level 1 (Yellow)    External Rotation  Strengthening;Both;15 reps;Theraband    Theraband Level (Shoulder External Rotation)  Level 1 (Yellow)      Shoulder Exercises: ROM/Strengthening   Wall Pushups  10 reps      Modalities   Modalities  Traction      Traction   Type of Traction  Lumbar    Min (lbs)  5    Max (lbs)  85    Hold  Time  99    Rest Time  5    Time  15                  PT Long Term Goals - 11/05/17 1732      PT LONG TERM GOAL #1   Title  Independent with a HEP.    Time  6    Period  Weeks    Status  New      PT LONG TERM GOAL #2   Title  Sit 30 minutes with pain not > 3/10.    Time  6    Period  Weeks    Status  New      PT LONG TERM GOAL #3   Title  Walk a community distance with pain not > 3/10.    Time  6    Period  Weeks    Status  New      PT LONG TERM GOAL #4   Title  Perform ADL's with pain not > 3/10.    Time  6    Period  Weeks    Status  New            Plan - 11/26/17 1602    Clinical Impression Statement  Patient tolerated today's treatment fairly well as more reps utilized intermittantly during the session. Patient reported LBP with bridges thus limited with exercise. VCs required for core activation throughout supine exercises. Patient interested in attempting traction which was completed at 85#. Patient educated that soreness may present following treatment and to assess her symptoms until next treatment. Patient reported "a little low back pain" during mechanical traction. Patient experienced pain and discomfort with transitioning from supine to sit from traction per patient report.     Rehab Potential  Good    PT Frequency  2x / week    PT Duration  6 weeks    PT Treatment/Interventions  ADLs/Self Care Home Management;Ultrasound;Traction;Moist Heat;Therapeutic activities;Therapeutic exercise;Patient/family education;Manual techniques;Dry needling    PT Next Visit Plan  Assess traction response and continue as symptoms dictate.    Consulted and Agree with Plan of Care  Patient       Patient will benefit from skilled therapeutic intervention in  order to improve the following deficits and impairments:  Decreased activity tolerance, Decreased range of motion, Pain  Visit Diagnosis: Pain in thoracic spine  Chronic right-sided low back pain without  sciatica     Problem List Patient Active Problem List   Diagnosis Date Noted  . Reactive depression 10/22/2017  . RUQ abdominal pain 10/07/2017  . Change in bowel habits 10/07/2017  . Multiple thyroid nodules 08/21/2017  . Prediabetes 08/21/2017  . Right upper quadrant pain 07/05/2017  . Gastroesophageal reflux disease without esophagitis 07/05/2017  . HTN (hypertension) 07/29/2014  . Hyperlipidemia 07/29/2014  . Syncope 07/29/2014  . Mixed stress and urge urinary incontinence 01/14/2014  . Recurrent UTI 01/14/2014  . Vaginal atrophy 01/14/2014  . Benign lesion of eyelid 02/07/2012    Standley Brooking, PTA 11/26/2017, 4:24 PM  [facility] [place], [place], [address] Phone: [phone]   Fax:  [phone]  Name: Rebecca Scott MRN: [id number] Date of Birth: 04-13-1956

## 2017-11-28 ENCOUNTER — Encounter: Payer: Self-pay | Admitting: Physical Therapy

## 2017-11-28 ENCOUNTER — Encounter (INDEPENDENT_AMBULATORY_CARE_PROVIDER_SITE_OTHER): Payer: Self-pay | Admitting: Internal Medicine

## 2017-11-28 ENCOUNTER — Encounter: Payer: Self-pay | Admitting: Cardiology

## 2017-11-28 ENCOUNTER — Ambulatory Visit: Payer: 59 | Admitting: Physical Therapy

## 2017-11-28 DIAGNOSIS — G8929 Other chronic pain: Secondary | ICD-10-CM

## 2017-11-28 DIAGNOSIS — M546 Pain in thoracic spine: Secondary | ICD-10-CM

## 2017-11-28 DIAGNOSIS — M545 Low back pain: Secondary | ICD-10-CM

## 2017-11-28 NOTE — Therapy (Signed)
[facility] [place], [place], [address] Phone: [phone]   Fax:  [phone]  Physical Therapy Treatment  Patient Details  Name: Rebecca Scott MRN: [id number] Date of Birth: [birth year] Referring Provider: Barnet Glasgow   Encounter Date: 11/28/2017  PT End of Session - 11/28/17 1741    Visit Number  8    Number of Visits  12    Date for PT Re-Evaluation  12/17/17    PT Start Time  0458    PT Stop Time  0555    PT Time Calculation (min)  57 min    Activity Tolerance  Patient tolerated treatment well    Behavior During Therapy  [facility] for tasks assessed/performed       Past Medical History:  Diagnosis Date  . Anemia   . Asthma    seasonal asthma rarely   . Chronic kidney disease    renal mass right kidney   . CPAP (continuous positive airway pressure) dependence   . Diverticulitis   . GERD (gastroesophageal reflux disease)   . Hemorrhoids   . Hyperlipemia   . Hypertension   . Migraine   . 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 PONV (postoperative nausea and vomiting)    patient reports being "slower to wake than average "   . Pre-diabetes    "ive been told i'm borderline"   . Sleep apnea    CPAP use     Past Surgical History:  Procedure Laterality Date  . ABDOMINAL HYSTERECTOMY    . APPENDECTOMY  1978  . BIOPSY  07/08/2017   Procedure: BIOPSY;  Surgeon: Rogene Houston, MD;  [facility];  Service: Endoscopy;;  gastric  . CHOLECYSTECTOMY  2005  . COLONOSCOPY WITH PROPOFOL N/A 10/28/2017   Procedure: COLONOSCOPY WITH PROPOFOL;  Surgeon: Rogene Houston, MD;  [facility];  Service: Endoscopy;  Laterality: N/A;  7:30  . ESOPHAGOGASTRODUODENOSCOPY N/A 07/08/2017   Procedure: ESOPHAGOGASTRODUODENOSCOPY (EGD);  Surgeon: Rogene Houston, MD;  [facility];  Service: Endoscopy;  Laterality: N/A;  220  . NASAL SEPTUM SURGERY    . ROBOTIC  ASSITED PARTIAL NEPHRECTOMY Right 05/17/2017   Procedure: XI ROBOTIC ASSITED PARTIAL NEPHRECTOMY;  Surgeon: Alexis Frock, MD;  [facility];  Service: Urology;  Laterality: Right;    There were no vitals filed for this visit.  Subjective Assessment - 11/28/17 1654    Subjective  My mid-back is better but my low back isn't.    Currently in Pain?  Yes    Pain Score  5     Pain Location  Back    Pain Orientation  Mid;Lower    Pain Descriptors / Indicators  Stabbing    Pain Type  Acute pain                      [facility] Adult PT Treatment/Exercise - 11/28/17 0001      Exercises   Exercises  Knee/Hip      Lumbar Exercises: Aerobic   UBE (Upper Arm Bike)  90 RPM's x 6 minutes.    Nustep  Level 4 x 11 minutes.      Traction   Type of Traction  Lumbar    Min (lbs)  15    Max (lbs)  85    Hold Time  99    Rest Time  5    Time  15  Manual Therapy   Manual Therapy  Soft tissue mobilization    Soft tissue mobilization  STW/M to right lower lumbar musculature and Mid-thoracic PA mobs (8 minutes total).                  PT Long Term Goals - 11/05/17 1732      PT LONG TERM GOAL #1   Title  Independent with a HEP.    Time  6    Period  Weeks    Status  New      PT LONG TERM GOAL #2   Title  Sit 30 minutes with pain not > 3/10.    Time  6    Period  Weeks    Status  New      PT LONG TERM GOAL #3   Title  Walk a community distance with pain not > 3/10.    Time  6    Period  Weeks    Status  New      PT LONG TERM GOAL #4   Title  Perform ADL's with pain not > 3/10.    Time  6    Period  Weeks    Status  New            Plan - 11/28/17 1746    Clinical Impression Statement  Patient did well with treatment today.  Resporting pain with deeper palpation today into right QL.  Thoracic pain is better.    PT Treatment/Interventions  ADLs/Self Care Home Management;Ultrasound;Traction;Moist Heat;Therapeutic activities;Therapeutic  exercise;Patient/family education;Manual techniques;Dry needling    PT Next Visit Plan  Assess traction response and continue as symptoms dictate.    Consulted and Agree with Plan of Care  Patient       Patient will benefit from skilled therapeutic intervention in order to improve the following deficits and impairments:  Decreased activity tolerance, Decreased range of motion, Pain  Visit Diagnosis: Pain in thoracic spine  Chronic right-sided low back pain without sciatica     Problem List Patient Active Problem List   Diagnosis Date Noted  . Reactive depression 10/22/2017  . RUQ abdominal pain 10/07/2017  . Change in bowel habits 10/07/2017  . Multiple thyroid nodules 08/21/2017  . Prediabetes 08/21/2017  . Right upper quadrant pain 07/05/2017  . Gastroesophageal reflux disease without esophagitis 07/05/2017  . HTN (hypertension) 07/29/2014  . Hyperlipidemia 07/29/2014  . Syncope 07/29/2014  . Mixed stress and urge urinary incontinence 01/14/2014  . Recurrent UTI 01/14/2014  . Vaginal atrophy 01/14/2014  . Benign lesion of eyelid 02/07/2012    Jarelle Ates, [place] MPT 11/28/2017, 5:55 PM  [facility] [place], [place], [address] Phone: [phone]   Fax:  [phone]  Name: Rebecca Scott MRN: [id number] Date of Birth: August 09, 1956

## 2017-12-03 ENCOUNTER — Encounter: Payer: Self-pay | Admitting: Family Medicine

## 2017-12-03 ENCOUNTER — Ambulatory Visit: Payer: 59 | Admitting: Physical Therapy

## 2017-12-03 DIAGNOSIS — M546 Pain in thoracic spine: Secondary | ICD-10-CM | POA: Diagnosis not present

## 2017-12-03 DIAGNOSIS — G8929 Other chronic pain: Secondary | ICD-10-CM

## 2017-12-03 DIAGNOSIS — Z85528 Personal history of other malignant neoplasm of kidney: Secondary | ICD-10-CM | POA: Insufficient documentation

## 2017-12-03 DIAGNOSIS — M545 Low back pain: Secondary | ICD-10-CM

## 2017-12-03 NOTE — Therapy (Signed)
[facility] [place], [place], [address] Phone: [phone]   Fax:  [phone]  Physical Therapy Treatment  Patient Details  Name: Rebecca Scott MRN: [id number] Date of Birth: Mar 04, 1956 Referring Provider: Barnet Glasgow   Encounter Date: 12/03/2017  PT End of Session - 12/03/17 1125    Visit Number  9    Number of Visits  12    Date for PT Re-Evaluation  12/17/17    PT Start Time  1119    PT Stop Time  1220    PT Time Calculation (min)  61 min    Activity Tolerance  Patient tolerated treatment well    Behavior During Therapy  [facility] for tasks assessed/performed       Past Medical History:  Diagnosis Date  . Anemia   . Asthma    seasonal asthma rarely   . Chronic kidney disease    renal mass right kidney   . CPAP (continuous positive airway pressure) dependence   . Diverticulitis   . GERD (gastroesophageal reflux disease)   . Hemorrhoids   . Hyperlipemia   . Hypertension   . Migraine   . 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 PONV (postoperative nausea and vomiting)    patient reports being "slower to wake than average "   . Pre-diabetes    "ive been told i'm borderline"   . Sleep apnea    CPAP use     Past Surgical History:  Procedure Laterality Date  . ABDOMINAL HYSTERECTOMY    . APPENDECTOMY  1978  . BIOPSY  07/08/2017   Procedure: BIOPSY;  Surgeon: Rogene Houston, MD;  [facility];  Service: Endoscopy;;  gastric  . CHOLECYSTECTOMY  2005  . COLONOSCOPY WITH PROPOFOL N/A 10/28/2017   Procedure: COLONOSCOPY WITH PROPOFOL;  Surgeon: Rogene Houston, MD;  [facility];  Service: Endoscopy;  Laterality: N/A;  7:30  . ESOPHAGOGASTRODUODENOSCOPY N/A 07/08/2017   Procedure: ESOPHAGOGASTRODUODENOSCOPY (EGD);  Surgeon: Rogene Houston, MD;  [facility];  Service: Endoscopy;  Laterality: N/A;  220  . NASAL SEPTUM SURGERY    .  ROBOTIC ASSITED PARTIAL NEPHRECTOMY Right 05/17/2017   Procedure: XI ROBOTIC ASSITED PARTIAL NEPHRECTOMY;  Surgeon: Alexis Frock, MD;  [facility]: [facility];  Service: Urology;  Laterality: Right;    There were no vitals filed for this visit.  Subjective Assessment - 12/03/17 1125    Subjective  Reports increased pain sunday to monday for unknown reason but to the point of crying.    Pertinent History  Partial kidney removal (2018); knee pain.    Limitations  Sitting    How long can you sit comfortably?  15 minutes.    How long can you walk comfortably?  Short community distances.    Diagnostic tests  Multilevel lumbar DDD.    Currently in Pain?  Yes    Pain Score  7     Pain Location  Back    Pain Orientation  Mid;Lower    Pain Descriptors / Indicators  Crying;Discomfort    Pain Type  Acute pain         [facility] PT Assessment - 12/03/17 0001      Assessment   Medical Diagnosis  Thoracic and low back pain.    Next MD Visit  01/2018      Precautions   Precautions  None  No data recorded       [facility] Adult PT Treatment/Exercise - 12/03/17 0001      Shoulder Exercises: ROM/Strengthening   UBE (Upper Arm Bike)  90 RPM x6 min (3 min forward, 3 min backward)      Modalities   Modalities  Electrical Stimulation;Moist Heat;Traction all modalities completed in supine      Moist Heat Therapy   Number Minutes Moist Heat  15 Minutes    Moist Heat Location  Lumbar Spine      Electrical Stimulation   Electrical Stimulation Location  B thoracolumbar paraspinals    Electrical Stimulation Action  IFC    Electrical Stimulation Parameters  1-10 hz x15 min    Electrical Stimulation Goals  Pain      Traction   Type of Traction  Lumbar    Min (lbs)  15    Max (lbs)  85    Hold Time  99    Rest Time  5    Time  15      Manual Therapy   Manual Therapy  Soft tissue mobilization    Soft tissue mobilization  STW to R thoracolumbar paraspinals and QL to reduce pain and  muscle tightness in prone                   PT Long Term Goals - 11/05/17 1732      PT LONG TERM GOAL #1   Title  Independent with a HEP.    Time  6    Period  Weeks    Status  New      PT LONG TERM GOAL #2   Title  Sit 30 minutes with pain not > 3/10.    Time  6    Period  Weeks    Status  New      PT LONG TERM GOAL #3   Title  Walk a community distance with pain not > 3/10.    Time  6    Period  Weeks    Status  New      PT LONG TERM GOAL #4   Title  Perform ADL's with pain not > 3/10.    Time  6    Period  Weeks    Status  New            Plan - 12/03/17 1211    Clinical Impression Statement  Patient limited by pain today during treatment. Limited exercise and manual therapy completed secondary to the pain. Increased muscle tightness noted with STW to R thoraclumbar paraspinals as well as R QL. Patient educated regarding dry needling as part of her POC and provided handout for education. Normal modalities response noted for all modalities to assist with pain control. Patient reported pain relief following treatment.    Rehab Potential  Good    PT Frequency  2x / week    PT Duration  6 weeks    PT Treatment/Interventions  ADLs/Self Care Home Management;Ultrasound;Traction;Moist Heat;Therapeutic activities;Therapeutic exercise;Patient/family education;Manual techniques;Dry needling    PT Next Visit Plan  Assess traction response and continue as symptoms dictate.    Consulted and Agree with Plan of Care  Patient       Patient will benefit from skilled therapeutic intervention in order to improve the following deficits and impairments:  Decreased activity tolerance, Decreased range of motion, Pain  Visit Diagnosis: Pain in thoracic spine  Chronic right-sided low back pain without sciatica     Problem  List Patient Active Problem List   Diagnosis Date Noted  . History of renal cell carcinoma 12/03/2017  . Reactive depression 10/22/2017  . RUQ  abdominal pain 10/07/2017  . Change in bowel habits 10/07/2017  . Multiple thyroid nodules 08/21/2017  . Prediabetes 08/21/2017  . Right upper quadrant pain 07/05/2017  . Gastroesophageal reflux disease without esophagitis 07/05/2017  . HTN (hypertension) 07/29/2014  . Hyperlipidemia 07/29/2014  . Syncope 07/29/2014  . Mixed stress and urge urinary incontinence 01/14/2014  . Recurrent UTI 01/14/2014  . Vaginal atrophy 01/14/2014  . Benign lesion of eyelid 02/07/2012    Standley Brooking, PTA 12/03/2017, 12:25 PM  [facility] [place], [place], [address] Phone: [phone]   Fax:  [phone]  Name: KARE DADO MRN: [id number] Date of Birth: Aug 02, 1956

## 2017-12-05 ENCOUNTER — Encounter: Payer: Self-pay | Admitting: Physical Therapy

## 2017-12-05 ENCOUNTER — Ambulatory Visit: Payer: 59 | Admitting: Physical Therapy

## 2017-12-05 DIAGNOSIS — G8929 Other chronic pain: Secondary | ICD-10-CM

## 2017-12-05 DIAGNOSIS — M546 Pain in thoracic spine: Secondary | ICD-10-CM | POA: Diagnosis not present

## 2017-12-05 DIAGNOSIS — M545 Low back pain: Secondary | ICD-10-CM

## 2017-12-05 NOTE — Therapy (Signed)
[facility] [place], [place], [address] Phone: [phone]   Fax:  [phone]  Physical Therapy Treatment  Patient Details  Name: Rebecca Scott MRN: [id number] Date of Birth: June 10, 1956 Referring Provider: Barnet Glasgow   Encounter Date: 12/05/2017  PT End of Session - 12/05/17 1518    Visit Number  10    Number of Visits  12    Date for PT Re-Evaluation  12/17/17    PT Start Time  7026    PT Stop Time  1610    PT Time Calculation (min)  57 min    Activity Tolerance  Patient tolerated treatment well    Behavior During Therapy  [facility] for tasks assessed/performed       Past Medical History:  Diagnosis Date  . Anemia   . Asthma    seasonal asthma rarely   . Chronic kidney disease    renal mass right kidney   . CPAP (continuous positive airway pressure) dependence   . Diverticulitis   . GERD (gastroesophageal reflux disease)   . Hemorrhoids   . Hyperlipemia   . Hypertension   . Migraine   . 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 PONV (postoperative nausea and vomiting)    patient reports being "slower to wake than average "   . Pre-diabetes    "ive been told i'm borderline"   . Sleep apnea    CPAP use     Past Surgical History:  Procedure Laterality Date  . ABDOMINAL HYSTERECTOMY    . APPENDECTOMY  1978  . BIOPSY  07/08/2017   Procedure: BIOPSY;  Surgeon: Rogene Houston, MD;  [facility];  Service: Endoscopy;;  gastric  . CHOLECYSTECTOMY  2005  . COLONOSCOPY WITH PROPOFOL N/A 10/28/2017   Procedure: COLONOSCOPY WITH PROPOFOL;  Surgeon: Rogene Houston, MD;  [facility];  Service: Endoscopy;  Laterality: N/A;  7:30  . ESOPHAGOGASTRODUODENOSCOPY N/A 07/08/2017   Procedure: ESOPHAGOGASTRODUODENOSCOPY (EGD);  Surgeon: Rogene Houston, MD;  [facility];  Service: Endoscopy;  Laterality: N/A;  220  . NASAL SEPTUM SURGERY    .  ROBOTIC ASSITED PARTIAL NEPHRECTOMY Right 05/17/2017   Procedure: XI ROBOTIC ASSITED PARTIAL NEPHRECTOMY;  Surgeon: Alexis Frock, MD;  [facility];  Service: Urology;  Laterality: Right;    There were no vitals filed for this visit.  Subjective Assessment - 12/05/17 1517    Subjective  Reports that she has not had any improvement in pain since last treatment. Reports that she does not have an oncologist as they told her they had removed all the cancer.    Pertinent History  Partial kidney removal (2018); knee pain.    Limitations  Sitting    How long can you sit comfortably?  15 minutes.    How long can you walk comfortably?  Short community distances.    Diagnostic tests  Multilevel lumbar DDD.    Currently in Pain?  Yes    Pain Score  6     Pain Location  Back    Pain Orientation  Mid;Lower    Pain Descriptors / Indicators  Discomfort    Pain Type  Acute pain         [facility] PT Assessment - 12/05/17 0001      Assessment   Medical Diagnosis  Thoracic and low back pain.    Next MD Visit  01/2018  Precautions   Precautions  None            No data recorded       [facility] Adult PT Treatment/Exercise - 12/05/17 0001      Lumbar Exercises: Aerobic   Nustep  L3 x10 min      Modalities   Modalities  Traction      Traction   Type of Traction  Lumbar    Min (lbs)  5    Max (lbs)  85    Hold Time  99    Rest Time  5    Time  15      Manual Therapy   Manual Therapy  Soft tissue mobilization    Soft tissue mobilization  STW to R thoracolumbar paraspinals and QL to reduce pain and muscle tightness in prone                   PT Long Term Goals - 11/05/17 1732      PT LONG TERM GOAL #1   Title  Independent with a HEP.    Time  6    Period  Weeks    Status  New      PT LONG TERM GOAL #2   Title  Sit 30 minutes with pain not > 3/10.    Time  6    Period  Weeks    Status  New      PT LONG TERM GOAL #3   Title  Walk a community distance  with pain not > 3/10.    Time  6    Period  Weeks    Status  New      PT LONG TERM GOAL #4   Title  Perform ADL's with pain not > 3/10.    Time  6    Period  Weeks    Status  New            Plan - 12/05/17 1645    Clinical Impression Statement  Patient tolerated today's treatment fair today as she arrived with continued LBP. Patient demonstrated facial grimacing intermittantly in R low back during Nustep which she described as a catch. Mali Applegate, MPT consulted in regards to patient's continued lack of progress and continued LBP. He recommended one more session of traction but encouraged patient to try DN. Increased discomfort with manual therapy to R low back and pressure with manual therapy to R low back. Increaesd muscle tightness palpated in patient's R lumbar paraspinals as well as QL. Mechanical lumbar traction maintained at 85# max.    Rehab Potential  Good    PT Frequency  2x / week    PT Duration  6 weeks    PT Treatment/Interventions  ADLs/Self Care Home Management;Ultrasound;Traction;Moist Heat;Therapeutic activities;Therapeutic exercise;Patient/family education;Manual techniques;Dry needling    PT Next Visit Plan  Complete DN.    Consulted and Agree with Plan of Care  Patient       Patient will benefit from skilled therapeutic intervention in order to improve the following deficits and impairments:  Decreased activity tolerance, Decreased range of motion, Pain  Visit Diagnosis: Pain in thoracic spine  Chronic right-sided low back pain without sciatica     Problem List Patient Active Problem List   Diagnosis Date Noted  . History of renal cell carcinoma 12/03/2017  . Reactive depression 10/22/2017  . RUQ abdominal pain 10/07/2017  . Change in bowel habits 10/07/2017  . Multiple thyroid nodules 08/21/2017  . Prediabetes  08/21/2017  . Right upper quadrant pain 07/05/2017  . Gastroesophageal reflux disease without esophagitis 07/05/2017  . HTN  (hypertension) 07/29/2014  . Hyperlipidemia 07/29/2014  . Syncope 07/29/2014  . Mixed stress and urge urinary incontinence 01/14/2014  . Recurrent UTI 01/14/2014  . Vaginal atrophy 01/14/2014  . Benign lesion of eyelid 02/07/2012    Standley Brooking, PTA 12/05/2017, 4:57 PM  [facility] [place], [place], [address] Phone: [phone]   Fax:  [phone]  Name: Rebecca Scott MRN: [id number] Date of Birth: [birth year]

## 2017-12-10 ENCOUNTER — Encounter: Payer: Self-pay | Admitting: Family Medicine

## 2017-12-10 ENCOUNTER — Ambulatory Visit: Payer: 59 | Admitting: Family Medicine

## 2017-12-10 ENCOUNTER — Ambulatory Visit: Payer: 59 | Attending: Anesthesiology | Admitting: Physical Therapy

## 2017-12-10 VITALS — BP 115/71 | HR 58 | Temp 98.0°F | Ht 63.0 in | Wt 214.0 lb

## 2017-12-10 DIAGNOSIS — E042 Nontoxic multinodular goiter: Secondary | ICD-10-CM | POA: Diagnosis not present

## 2017-12-10 DIAGNOSIS — F329 Major depressive disorder, single episode, unspecified: Secondary | ICD-10-CM | POA: Diagnosis not present

## 2017-12-10 DIAGNOSIS — M546 Pain in thoracic spine: Secondary | ICD-10-CM | POA: Diagnosis not present

## 2017-12-10 DIAGNOSIS — G8929 Other chronic pain: Secondary | ICD-10-CM | POA: Diagnosis present

## 2017-12-10 DIAGNOSIS — Z85528 Personal history of other malignant neoplasm of kidney: Secondary | ICD-10-CM | POA: Diagnosis not present

## 2017-12-10 DIAGNOSIS — M545 Low back pain: Secondary | ICD-10-CM | POA: Diagnosis present

## 2017-12-10 NOTE — Progress Notes (Signed)
Subjective: CC: Anxiety HPI: Rebecca Scott is a 62 y.o. female presenting to clinic today for:  1. Anxiety Patient was seen 1 month ago.  She has started wean off Klonopin.  She is down to 0.25mg  QAM and 0.25mg  QPM.  She notes that she has been doing well on this regimen.  No increased anxiety symptoms.  No excessive sedation.  2.  History of renal cell carcinoma Patient notes that she was assigned a nurse after being diagnosed with renal cell carcinoma, who voiced concern that she is never seen an oncologist.  Patient notes that in fact she has never seen an oncologist.  She does wish to see one however.  I reviewed her 11/20/2017 endocrinology note, which noted that her care had been in fact reviewed by their oncology group and it was determined that she had a very low probability for metastasis.  3.  Multiple thyroid nodules Patient was seen on 11/20/2017 by endocrinology at [facility].  She notes that there is plans for a thyroid ultrasound this Thursday.  Patient notes that her TSH and T3 levels have been fine but that her thyroglobulin level was higher than expected.  She voices some concern over this.  She does note that she continues to have intermittent episodes of heart palpitations and diarrhea alternating with constipation.  She is not currently treated with any thyroid replacement medications.  4.  Chronic thoracic back pain Patient has had a chronic thoracic back pain since having part of her kidney surgically removed for a renal cell carcinoma.  She notes that she is currently under the care of pain management and at this point they are proceeding with conservative treatment.  Today she went to the physical therapist and had dry needling performed.  She notes she feels sore after this treatment but this is her first 1 and she is not sure if it is going to be helpful yet.  Thus far, she notes that the therapies provided at physical therapy have not been substantially helpful.   Her next follow-up with pain management is not until May.  She does voice some concern because her last "approved physical therapy appointment" is upcoming.  She will essentially go without any interventions for about 2-3 weeks.  Denies lotion any weakness, numbness or tingling.  ROS: Per HPI  Past Medical History:  Diagnosis Date  . Anemia   . Asthma    seasonal asthma rarely   . Chronic kidney disease    renal mass right kidney   . CPAP (continuous positive airway pressure) dependence   . Diverticulitis   . GERD (gastroesophageal reflux disease)   . Hemorrhoids   . Hyperlipemia   . Hypertension   . Migraine   . 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 PONV (postoperative nausea and vomiting)    patient reports being "slower to wake than average "   . Pre-diabetes    "ive been told i'm borderline"   . Sleep apnea    CPAP use    Allergies  Allergen Reactions  . Iodinated Diagnostic Agents Hives and Itching  . Isovue [Iopamidol] Itching    And hives   . Nitrofurantoin Hives  . Sulfa Antibiotics Hives  . Adhesive [Tape] Rash and Other (See Comments)    Paper tape ONLY  . Mango Flavor Swelling and Rash    Tongue swelling, feels like throat is closing in on her     Current  Outpatient Medications:  .  aspirin EC 81 MG tablet, Take 1 tablet (81 mg total) by mouth daily., Disp: , Rfl:  .  cephALEXin (KEFLEX) 500 MG capsule, Take 1 capsule (500 mg total) by mouth 2 (two) times daily., Disp: 14 capsule, Rfl: 0 .  clonazePAM (KLONOPIN) 0.5 MG tablet, Take 0.5 mg by mouth 2 (two) times daily. , Disp: , Rfl:  .  DULoxetine (CYMBALTA) 20 MG capsule, Take 20 mg by mouth 2 (two) times daily., Disp: , Rfl:  .  gabapentin (NEURONTIN) 100 MG capsule, Take 300 mg by mouth 3 (three) times daily. , Disp: , Rfl: 3 .  HYDROcodone-acetaminophen (NORCO/VICODIN) 5-325 MG tablet, Take 1-2 tablets by mouth every 6 (six) hours as needed for moderate pain.  (Patient taking differently: Take 1 tablet by mouth daily as needed for moderate pain. ), Disp: 20 tablet, Rfl: 0 .  lidocaine (LIDODERM) 5 %, Place 1 patch onto the skin daily. Remove & Discard patch within 12 hours or as directed by MD (Patient taking differently: Place 1 patch onto the skin daily as needed (pain). Remove & Discard patch within 12 hours or as directed by MD), Disp: 30 patch, Rfl: 1 .  loratadine (CLARITIN) 10 MG tablet, Take 10 mg by mouth daily as needed for allergies., Disp: , Rfl:  .  losartan-hydrochlorothiazide (HYZAAR) 100-25 MG tablet, Take 1 tablet by mouth daily., Disp: , Rfl:  .  ondansetron (ZOFRAN ODT) 4 MG disintegrating tablet, Take 1 tablet (4 mg total) every 8 (eight) hours as needed by mouth for nausea or vomiting. 4mg  ODT q4 hours prn nausea/vomit, Disp: 30 tablet, Rfl: 0 .  pantoprazole (PROTONIX) 40 MG tablet, Take 1 tablet (40 mg total) by mouth 2 (two) times daily before a meal., Disp: 60 tablet, Rfl: 2 .  potassium chloride SA (K-DUR,KLOR-CON) 20 MEQ tablet, Take 20 mEq by mouth daily. , Disp: , Rfl:  .  rosuvastatin (CRESTOR) 5 MG tablet, Take 5 mg by mouth daily. , Disp: , Rfl:    Social History   Socioeconomic History  . Marital status: Widowed    Spouse name: Not on file  . Number of children: Not on file  . Years of education: Not on file  . Highest education level: Not on file  Occupational History  . Not on file  Social Needs  . Financial resource strain: Not on file  . Food insecurity:    Worry: Not on file    Inability: Not on file  . Transportation needs:    Medical: Not on file    Non-medical: Not on file  Tobacco Use  . Smoking status: Never Smoker  . Smokeless tobacco: Never Used  Substance and Sexual Activity  . Alcohol use: Yes    Comment: rarely   . Drug use: No  . Sexual activity: Not Currently  Lifestyle  . Physical activity:    Days per week: Not on file    Minutes per session: Not on file  . Stress: Not on file    Relationships  . Social connections:    Talks on phone: Not on file    Gets together: Not on file    Attends religious service: Not on file    Active member of club or organization: Not on file    Attends meetings of clubs or organizations: Not on file    Relationship status: Not on file  . Intimate partner violence:    Fear of current or ex partner: Not  on file    Emotionally abused: Not on file    Physically abused: Not on file    Forced sexual activity: Not on file  Other Topics Concern  . Not on file  Social History Narrative  . Not on file   Family History  Problem Relation Age of Onset  . COPD Mother   . Diabetes Mother   . Hypertension Mother   . High Cholesterol Mother   . Liver cancer Mother   . COPD Sister   . Hypertension Brother   . High Cholesterol Brother   . Hypertension Brother   . Asthma Son     Health Maintenance: ?Pap, HIV, TDap, Hep C screen  Objective: Office vital signs reviewed. BP 115/71   Pulse (!) 58   Temp 98 F (36.7 C) (Oral)   Ht 5\' 3"  (1.6 m)   Wt 214 lb (97.1 kg)   BMI 37.91 kg/m   Physical Examination:  General: Awake, alert, well appearing female, appears somewhat uncomfortable (grabbing her back) HEENT: Normal    Neck: No masses palpated. No lymphadenopathy; no palpable thyroid nodules.    Ears: Tympanic membranes intact, normal light reflex, no erythema, no bulging    Eyes: PERRLA, extraocular movement in tact, sclera white, no exophthalmos.    Throat: moist mucus membranes, no erythema Cardio: regular rate and rhythm, S1S2 heard, no murmurs appreciated Pulm: clear to auscultation bilaterally, no wheezes, rhonchi or rales; normal work of breathing on room air   Assessment/ Plan: 62 y.o. female   1. Multiple thyroid nodules Currently under the care of endocrinology at [facility].  She has an upcoming thyroid ultrasound for Thursday.  We will continue to follow along with their assessment and plan.  2. History of  renal cell carcinoma Per previous reports, low likelihood for metastasis.  She is status post partial nephrectomy.  Patient would like to see an oncologist and I think that this is appropriate.  I placed a referral to oncology in [place].  She will be contacted with an appointment and I will follow along for their recommendations. - Ambulatory referral to Oncology  3. Reactive depression Doing well on Cymbalta.  She is also doing well on the taper off of clonazepam.  She reduce dose to 0.25 mg daily with plans to use every other day then completely discontinue.  No refills needed at this time.  4. Chronic right-sided thoracic back pain Currently under the care of pain management.  So far, it sounds like she is not getting much relief from physical therapy and other conservative measures.  She voices some concern during today's appointment over her next appointment with pain management not being until May and her physical therapy sessions ending within the next week or so.  I did discuss with her that she may consider asking for sooner appointment if physical therapy sessions are not extended.  We will continue to follow along with their recommendations.  Additionally, it appears that she has an upcoming cardiology visit for her syncopal episodes.   Janora Norlander, DO [place] [phone]

## 2017-12-10 NOTE — Patient Instructions (Addendum)
[facility]  DRY NEEDLING CONSENT FORM   Trigger point dry needling is a physical therapy approach to treat Myofascial Pain and Dysfunction.  Dry Needling (DN) is a valuable and effective way to deactivate myofascial trigger points (muscle knots). It is skilled intervention that uses a thin filiform needle to penetrate the skin and stimulate underlying myofascial trigger points, muscular, and connective tissues for the management of neuromusculoskeletal pain and movement impairments.  A local twitch response (LTR) will be elicited.  This can sometimes feel like a deep ache in the muscle during the procedure. Multiple trigger points in multiple muscles can be treated during each treatment.  No medication of any kind is injected.   As with any medical treatment and procedure, there are possible adverse events.  While significant adverse events are uncommon, they do sometimes occur and must be considered prior to giving consent.  1. Dry needling often causes a "post needling soreness".  There can be an increase in pain from a couple of hours to 2-3 days, followed by an improvement in the overall pain state. 2. Any time a needle is used there is a risk of infection.  However, we are using new, sterile, and disposable needles; infections are extremely rare. 3. There is a possibility that you may bleed or bruise.  You may feel tired and some nausea following treatment. 4. There is a rare possibility of a pneumothorax (air in the chest cavity). 5. Allergic reaction to nickel in the stainless steel needle. 6. If a nerve is touched, it may cause paresthesia (a prickling/shock sensation) which is usually brief, but may continue for a couple of days.  Following treatment stay hydrated. Use heat if you are sore. Continue regular activities but not too vigorous initially after treatment for 24-48 hours. You may apply heat to sore muscles.  Dry Needling is best when combined with  other physical therapy interventions such as strengthening, stretching and other therapeutic modalities.    Side Bow    Lie on back with arms at side. Bow hips to right, move shoulders to the left and both legs to the left until you feel a stretch.  Hold for 30 breaths. Repeat 1-2 times. Work up to holding 1-5 minutes. Repeat on other side if needed.  Madelyn Flavors, PT 12/10/17 9:51 AM [facility] [address] [place], [place], [address] Phone: [phone]   Fax:  [phone]

## 2017-12-10 NOTE — Therapy (Signed)
[facility], [place], [address] Phone: [phone]   Fax:  [phone]  Physical Therapy Treatment  Patient Details  Name: Rebecca Scott MRN: [id number] Date of Birth: 1955-10-10 Referring Provider: Barnet Glasgow   Encounter Date: 12/10/2017  PT End of Session - 12/10/17 0904    Visit Number  11    Number of Visits  12    Date for PT Re-Evaluation  12/17/17    PT Start Time  0900    PT Stop Time  1000 delay in getting room    PT Time Calculation (min)  60 min    Activity Tolerance  Patient tolerated treatment well    Behavior During Therapy  [facility] for tasks assessed/performed       Past Medical History:  Diagnosis Date  . Anemia   . Asthma    seasonal asthma rarely   . Chronic kidney disease    renal mass right kidney   . CPAP (continuous positive airway pressure) dependence   . Diverticulitis   . GERD (gastroesophageal reflux disease)   . Hemorrhoids   . Hyperlipemia   . Hypertension   . Migraine   . 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 PONV (postoperative nausea and vomiting)    patient reports being "slower to wake than average "   . Pre-diabetes    "ive been told i'm borderline"   . Sleep apnea    CPAP use     Past Surgical History:  Procedure Laterality Date  . ABDOMINAL HYSTERECTOMY    . APPENDECTOMY  1978  . BIOPSY  07/08/2017   Procedure: BIOPSY;  Surgeon: Rogene Houston, MD;  [facility];  Service: Endoscopy;;  gastric  . CHOLECYSTECTOMY  2005  . COLONOSCOPY WITH PROPOFOL N/A 10/28/2017   Procedure: COLONOSCOPY WITH PROPOFOL;  Surgeon: Rogene Houston, MD;  [facility];  Service: Endoscopy;  Laterality: N/A;  7:30  . ESOPHAGOGASTRODUODENOSCOPY N/A 07/08/2017   Procedure: ESOPHAGOGASTRODUODENOSCOPY (EGD);  Surgeon: Rogene Houston, MD;  [facility];  Service: Endoscopy;  Laterality: N/A;  220  . NASAL  SEPTUM SURGERY    . ROBOTIC ASSITED PARTIAL NEPHRECTOMY Right 05/17/2017   Procedure: XI ROBOTIC ASSITED PARTIAL NEPHRECTOMY;  Surgeon: Alexis Frock, MD;  [facility];  Service: Urology;  Laterality: Right;    There were no vitals filed for this visit.  Subjective Assessment - 12/10/17 0904    Subjective  Patient reports temporary relief with traction for less than 24 hours.    Currently in Pain?  Yes    Pain Score  6     Pain Location  Back    Pain Orientation  Mid;Lower    Pain Descriptors / Indicators  Discomfort    Pain Type  Acute pain         [facility] PT Assessment - 12/10/17 0001      Posture/Postural Control   Posture Comments  slighly depressed R shoulder; ant pelvic tilt                   [facility] Adult PT Treatment/Exercise - 12/10/17 0001      Lumbar Exercises: Stretches   Other Lumbar Stretch Exercise  C stretch in supine for R side; pt unable to place feet together due to tightness      Modalities   Modalities  Moist Heat;Electrical Stimulation      Moist Heat  Therapy   Number Minutes Moist Heat  15 Minutes    Moist Heat Location  Lumbar Spine      Electrical Stimulation   Electrical Stimulation Location  B thoracolumbar paraspinals and gluts    Electrical Stimulation Action  IFC    Electrical Stimulation Parameters  80-150 Hz x 15 min    Electrical Stimulation Goals  Pain      Manual Therapy   Manual Therapy  Soft tissue mobilization    Soft tissue mobilization  to Bil lumbar/throraco paraspinals and gluteals       Trigger Point Dry Needling - 12/10/17 1037    Consent Given?  Yes    Education Handout Provided  Yes    Muscles Treated Upper Body  Quadratus Lumborum;Longissimus R QL; Bil multifidi L2-5    Muscles Treated Lower Body  Gluteus maximus and medius Bil    Longissimus Response  Twitch response elicited;Palpable increased muscle length L greater than R    Gluteus Maximus Response  Twitch response elicited;Palpable increased  muscle length           PT Education - 12/10/17 1430    Education provided  Yes    Education Details  DN education and aftercare; HEP    Person(s) Educated  Patient    Methods  Explanation;Demonstration;Verbal cues;Handout;Tactile cues    Comprehension  Verbalized understanding;Returned demonstration          PT Long Term Goals - 11/05/17 1732      PT LONG TERM GOAL #1   Title  Independent with a HEP.    Time  6    Period  Weeks    Status  New      PT LONG TERM GOAL #2   Title  Sit 30 minutes with pain not > 3/10.    Time  6    Period  Weeks    Status  New      PT LONG TERM GOAL #3   Title  Walk a community distance with pain not > 3/10.    Time  6    Period  Weeks    Status  New      PT LONG TERM GOAL #4   Title  Perform ADL's with pain not > 3/10.    Time  6    Period  Weeks    Status  New            Plan - 12/10/17 1431    Clinical Impression Statement  Patient presented today with no significant change in back pain. She reports temporary relief from traction. Patient was educated on DN precautions and contraindications and gave consent. She responded well to DN in lumbar mulitfiidi and gluteals. She reported increased soreness after treatment, but had some relief with estim.    PT Treatment/Interventions  ADLs/Self Care Home Management;Ultrasound;Traction;Moist Heat;Therapeutic activities;Therapeutic exercise;Patient/family education;Manual techniques;Dry needling    PT Next Visit Plan  Assess DN and continue as indicated; review C stretch       Patient will benefit from skilled therapeutic intervention in order to improve the following deficits and impairments:  Decreased activity tolerance, Decreased range of motion, Pain  Visit Diagnosis: Pain in thoracic spine  Chronic right-sided low back pain without sciatica     Problem List Patient Active Problem List   Diagnosis Date Noted  . History of renal cell carcinoma 12/03/2017  . Reactive  depression 10/22/2017  . Change in bowel habits 10/07/2017  . Multiple thyroid nodules 08/21/2017  .  Prediabetes 08/21/2017  . Gastroesophageal reflux disease without esophagitis 07/05/2017  . HTN (hypertension) 07/29/2014  . Hyperlipidemia 07/29/2014  . Syncope 07/29/2014  . Mixed stress and urge urinary incontinence 01/14/2014  . Recurrent UTI 01/14/2014  . Vaginal atrophy 01/14/2014  . Benign lesion of eyelid 02/07/2012    Madelyn Flavors PT 12/10/2017, 2:35 PM  [facility] [place], [place], [address] Phone: [phone]   Fax:  [phone]  Name: Rebecca Scott MRN: [id number] Date of Birth: [birth year]

## 2017-12-10 NOTE — Patient Instructions (Signed)
I have placed a referral to oncology.  Please call me if you do not receive an appointment within the next week.  Please have [facility] send me the results and records regarding your thyroid.  Call me for refills once he forgot which medication you need refills on.  Plan to see me sometime in the summer or sooner if needed.

## 2017-12-12 ENCOUNTER — Encounter: Payer: 59 | Admitting: Physical Therapy

## 2017-12-16 ENCOUNTER — Encounter: Payer: Self-pay | Admitting: Cardiology

## 2017-12-16 ENCOUNTER — Ambulatory Visit (INDEPENDENT_AMBULATORY_CARE_PROVIDER_SITE_OTHER): Payer: 59 | Admitting: Cardiology

## 2017-12-16 VITALS — BP 122/88 | HR 64 | Ht 63.0 in | Wt 212.4 lb

## 2017-12-16 DIAGNOSIS — R55 Syncope and collapse: Secondary | ICD-10-CM | POA: Diagnosis not present

## 2017-12-16 MED ORDER — FUROSEMIDE 20 MG PO TABS: ORAL_TABLET | ORAL | 1 refills | Status: DC

## 2017-12-16 MED ORDER — LOSARTAN POTASSIUM 100 MG PO TABS: 100.0000 mg | ORAL_TABLET | Freq: Every day | ORAL | 1 refills | Status: DC

## 2017-12-16 NOTE — Progress Notes (Signed)
Clinical Summary Rebecca Scott is a 62 y.o.female seen today for follow up of the following medical problems.    1. Syncope - she reports approximately 8 episodes starting in July. Over the last few months her husband had taken ill and recently passed away, the onset of her syncopal episodes correlates with recent severe stress related to this.  - first episode July. Occurred just after coming home from hospital with her husband. She was standing make the bed when she lost consciousness. Denies any prodrome. Does not remember any specific symptoms prior. LOC for a few minutes.  - second episode a few weeks later. Occurred while walking out of church helping her husband. Does not recall prodrome at that time either - episode 2 weeks ago while laying in hospital bed, after standing had episode. At that time had some mild chest pain prior. -  completed echo that showed VLEF 55% , grade I diastolic dysfunction, no significant valvular pathology. We also decreased her losartan to 50mg  daily and changed her HCTZ to prn only.   - ER visit 08/2017 with presyncope.  - occurred at church. Standing while singing. Felt off balance, unsteady. Went partially to ground.  - water x 4, 1 cup coffee, occasional sodas, no energy drinks, occasional tea, no EtOH. - can have intermittent diarrhea vs constipation.   - orthostatic in clinic today, DBP drops 13 points with sitting up.   2. Chronic pain - followed in pain clinic for right sided chest wall pain.      Past Medical History:  Diagnosis Date  . Anemia   . Asthma    seasonal asthma rarely   . Chronic kidney disease    renal mass right kidney   . CPAP (continuous positive airway pressure) dependence   . Diverticulitis   . GERD (gastroesophageal reflux disease)   . Hemorrhoids   . Hyperlipemia   . Hypertension   . Migraine   . 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 PONV (postoperative  nausea and vomiting)    patient reports being "slower to wake than average "   . Pre-diabetes    "ive been told i'm borderline"   . Sleep apnea    CPAP use      Allergies  Allergen Reactions  . Iodinated Diagnostic Agents Hives and Itching  . Isovue [Iopamidol] Itching    And hives   . Nitrofurantoin Hives  . Other Hives, Itching, Other (See Comments) and Rash    Paper tape ONLY  . Sulfa Antibiotics Hives  . Adhesive [Tape] Rash and Other (See Comments)    Paper tape ONLY  . Mango Flavor Swelling and Rash    Tongue swelling, feels like throat is closing in on her      Current Outpatient Medications  Medication Sig Dispense Refill  . aspirin EC 81 MG tablet Take 1 tablet (81 mg total) by mouth daily.    . clonazePAM (KLONOPIN) 0.5 MG tablet Take 0.25 mg by mouth daily as needed.    . DULoxetine (CYMBALTA) 20 MG capsule Take 20 mg by mouth 2 (two) times daily.    Marland Kitchen gabapentin (NEURONTIN) 100 MG capsule Take 300 mg by mouth 3 (three) times daily.   3  . HYDROcodone-acetaminophen (NORCO/VICODIN) 5-325 MG tablet Take 1-2 tablets by mouth every 6 (six) hours as needed for moderate pain. (Patient taking differently: Take 1 tablet by mouth daily as needed for moderate pain. )  20 tablet 0  . lidocaine (LIDODERM) 5 % Place 1 patch onto the skin daily. Remove & Discard patch within 12 hours or as directed by MD (Patient taking differently: Place 1 patch onto the skin daily as needed (pain). Remove & Discard patch within 12 hours or as directed by MD) 30 patch 1  . loratadine (CLARITIN) 10 MG tablet Take 10 mg by mouth daily as needed for allergies.    Marland Kitchen losartan-hydrochlorothiazide (HYZAAR) 100-25 MG tablet Take 1 tablet by mouth daily.    . ondansetron (ZOFRAN ODT) 4 MG disintegrating tablet Take 1 tablet (4 mg total) every 8 (eight) hours as needed by mouth for nausea or vomiting. 4mg  ODT q4 hours prn nausea/vomit 30 tablet 0  . pantoprazole (PROTONIX) 40 MG tablet Take 1 tablet (40 mg  total) by mouth 2 (two) times daily before a meal. 60 tablet 2  . potassium chloride SA (K-DUR,KLOR-CON) 20 MEQ tablet Take 20 mEq by mouth daily.     . rosuvastatin (CRESTOR) 5 MG tablet Take 5 mg by mouth daily.      No current facility-administered medications for this visit.      Past Surgical History:  Procedure Laterality Date  . ABDOMINAL HYSTERECTOMY    . APPENDECTOMY  1978  . BIOPSY  07/08/2017   Procedure: BIOPSY;  Surgeon: Rogene Houston, MD;  [facility];  Service: Endoscopy;;  gastric  . CHOLECYSTECTOMY  2005  . COLONOSCOPY WITH PROPOFOL N/A 10/28/2017   Procedure: COLONOSCOPY WITH PROPOFOL;  Surgeon: Rogene Houston, MD;  [facility];  Service: Endoscopy;  Laterality: N/A;  7:30  . ESOPHAGOGASTRODUODENOSCOPY N/A 07/08/2017   Procedure: ESOPHAGOGASTRODUODENOSCOPY (EGD);  Surgeon: Rogene Houston, MD;  [facility];  Service: Endoscopy;  Laterality: N/A;  220  . NASAL SEPTUM SURGERY    . ROBOTIC ASSITED PARTIAL NEPHRECTOMY Right 05/17/2017   Procedure: XI ROBOTIC ASSITED PARTIAL NEPHRECTOMY;  Surgeon: Alexis Frock, MD;  [facility];  Service: Urology;  Laterality: Right;     Allergies  Allergen Reactions  . Iodinated Diagnostic Agents Hives and Itching  . Isovue [Iopamidol] Itching    And hives   . Nitrofurantoin Hives  . Other Hives, Itching, Other (See Comments) and Rash    Paper tape ONLY  . Sulfa Antibiotics Hives  . Adhesive [Tape] Rash and Other (See Comments)    Paper tape ONLY  . Mango Flavor Swelling and Rash    Tongue swelling, feels like throat is closing in on her       Family History  Problem Relation Age of Onset  . COPD Mother   . Diabetes Mother   . Hypertension Mother   . High Cholesterol Mother   . Liver cancer Mother   . COPD Sister   . Hypertension Brother   . High Cholesterol Brother   . Hypertension Brother   . Asthma Son      Social History Rebecca Scott reports that she has never  smoked. She has never used smokeless tobacco. Rebecca Scott reports that she drinks alcohol.   Review of Systems CONSTITUTIONAL: No weight loss, fever, chills, weakness or fatigue.  HEENT: Eyes: No visual loss, blurred vision, double vision or yellow sclerae.No hearing loss, sneezing, congestion, runny nose or sore throat.  SKIN: No rash or itching.  CARDIOVASCULAR: per hpi RESPIRATORY: No shortness of breath, cough or sputum.  GASTROINTESTINAL: No anorexia, nausea, vomiting or diarrhea. No abdominal pain or blood.  GENITOURINARY: No burning  on urination, no polyuria NEUROLOGICAL: No headache, dizziness, syncope, paralysis, ataxia, numbness or tingling in the extremities. No change in bowel or bladder control.  MUSCULOSKELETAL: No muscle, back pain, joint pain or stiffness.  LYMPHATICS: No enlarged nodes. No history of splenectomy.  PSYCHIATRIC: No history of depression or anxiety.  ENDOCRINOLOGIC: No reports of sweating, cold or heat intolerance. No polyuria or polydipsia.  Marland Kitchen   Physical Examination Vitals:   12/16/17 1402  BP: 122/88  Pulse: 64  SpO2: 98%   Vitals:   12/16/17 1402  Weight: 212 lb 6.4 oz (96.3 kg)  Height: 5\' 3"  (1.6 m)    Gen: resting comfortably, no acute distress HEENT: no scleral icterus, pupils equal round and reactive, no palptable cervical adenopathy,  CV: RRR, no m/r/g, no jvd Resp: Clear to auscultation bilaterally GI: abdomen is soft, non-tender, non-distended, normal bowel sounds, no hepatosplenomegaly MSK: extremities are warm, no edema.  Skin: warm, no rash Neuro:  no focal deficits Psych: appropriate affect   Diagnostic Studies Echo 07/2014  Study Conclusions  - Left ventricle: The cavity size was normal. Wall thickness was normal. The estimated ejection fraction was 55%. Wall motion was normal; there were no regional wall motion abnormalities. Doppler parameters are consistent with abnormal left ventricular relaxation (grade 1  diastolic dysfunction). - Aortic valve: There was trivial regurgitation. - Mitral valve: Mildly thickened leaflets . Mild systolic bowing without prolapse, involving the anterior leaflet. There was mild regurgitation directed posteriorly. - Left atrium: The atrium was moderately dilated. - Right atrium: Central venous pressure (est): 3 mm Hg. - Atrial septum: The septum bowed from left to right, consistent with increased left atrial pressure. - Tricuspid valve: There was trivial regurgitation. - Pulmonary arteries: PA peak pressure: 23 mm Hg (S). - Pericardium, extracardiac: A prominent pericardial fat pad was present.  Impressions:  - Normal LV wall thickness and chamber size with LVEF approximately 89%, grade 1 diastolic dysfunction. Mildly thickened mitral leaflets with bowing of the anterior leaflet and mild posteriorly directed mitral regurgitation. Moderate left atrial enlargement. Trivial tricuspid regurgitation with PASP 23 mmHg.     Assessment and Plan  1. Syncope - episodes consistent with orthostatic syncope. DBP drop 13 points in clinic today with position change - d/c her HCTZ, start lasix just prn only. Encouraged aggressive hydration - given Rx for compression stockings - manual QTc 480, mildly prolonged. At this time based on history do not suspect this is related to her episodes.         Arnoldo Lenis, M.D.

## 2017-12-16 NOTE — Patient Instructions (Signed)
Your physician recommends that you schedule a follow-up appointment in: [place] has recommended you make the following change in your medication:   STOP HYZAAR   START LOSARTAN 100 MG DAILY  START LASIX 20 MG DAILY AS NEEDED FOR SWELLING  WE HAVE GIVEN YOU AN ORDER FOR COMPRESSION STOCKINGS  Thank you for choosing [facility]!!

## 2017-12-17 ENCOUNTER — Ambulatory Visit: Payer: 59 | Admitting: Physical Therapy

## 2017-12-17 DIAGNOSIS — M546 Pain in thoracic spine: Secondary | ICD-10-CM | POA: Diagnosis not present

## 2017-12-17 DIAGNOSIS — G8929 Other chronic pain: Secondary | ICD-10-CM

## 2017-12-17 DIAGNOSIS — M545 Low back pain: Secondary | ICD-10-CM

## 2017-12-17 NOTE — Therapy (Signed)
[facility] [place], [place], [address] Phone: [phone]   Fax:  [phone]  Physical Therapy Treatment  Patient Details  Name: Rebecca Scott MRN: [id number] Date of Birth: 1955/12/05 Referring Provider: Barnet Glasgow   Encounter Date: 12/17/2017  PT End of Session - 12/17/17 1032    Visit Number  12    Number of Visits  16    Date for PT Re-Evaluation  01/14/18    PT Start Time  0945    PT Stop Time  1045    PT Time Calculation (min)  60 min    Activity Tolerance  Patient tolerated treatment well    Behavior During Therapy  [facility] for tasks assessed/performed       Past Medical History:  Diagnosis Date  . Anemia   . Asthma    seasonal asthma rarely   . Chronic kidney disease    renal mass right kidney   . CPAP (continuous positive airway pressure) dependence   . Diverticulitis   . GERD (gastroesophageal reflux disease)   . Hemorrhoids   . Hyperlipemia   . Hypertension   . Migraine   . 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 PONV (postoperative nausea and vomiting)    patient reports being "slower to wake than average "   . Pre-diabetes    "ive been told i'm borderline"   . Sleep apnea    CPAP use     Past Surgical History:  Procedure Laterality Date  . ABDOMINAL HYSTERECTOMY    . APPENDECTOMY  1978  . BIOPSY  07/08/2017   Procedure: BIOPSY;  Surgeon: Rogene Houston, MD;  [facility];  Service: Endoscopy;;  gastric  . CHOLECYSTECTOMY  2005  . COLONOSCOPY WITH PROPOFOL N/A 10/28/2017   Procedure: COLONOSCOPY WITH PROPOFOL;  Surgeon: Rogene Houston, MD;  [facility];  Service: Endoscopy;  Laterality: N/A;  7:30  . ESOPHAGOGASTRODUODENOSCOPY N/A 07/08/2017   Procedure: ESOPHAGOGASTRODUODENOSCOPY (EGD);  Surgeon: Rogene Houston, MD;  [facility];  Service: Endoscopy;  Laterality: N/A;  220  . NASAL SEPTUM SURGERY    .  ROBOTIC ASSITED PARTIAL NEPHRECTOMY Right 05/17/2017   Procedure: XI ROBOTIC ASSITED PARTIAL NEPHRECTOMY;  Surgeon: Alexis Frock, MD;  [facility];  Service: Urology;  Laterality: Right;    There were no vitals filed for this visit.  Subjective Assessment - 12/17/17 0947    Subjective  Patient reports 60% improvement since last visit. She states she is almost pain free on the left side, however her pain is never less than a 4/10 at this time.  Still having rib pain and pain on R .    Pertinent History  Partial kidney removal (2018); knee pain.    How long can you sit comfortably?  3 hours    Currently in Pain?  Yes    Pain Score  4     Pain Location  Back    Pain Orientation  Lower;Right    Pain Descriptors / Indicators  Discomfort                       [facility] Adult PT Treatment/Exercise - 12/17/17 0001      Modalities   Modalities  Electrical Stimulation      Electrical Stimulation   Electrical Stimulation Location  R thoracic and R Lumbar/glutes    Electrical Stimulation Action  premod  Electrical Stimulation Parameters  80-150 Hz x 15 min    Electrical Stimulation Goals  Pain      Manual Therapy   Manual Therapy  Soft tissue mobilization;Myofascial release    Soft tissue mobilization  to R thoracolumbar paraspinals, R gluteals    Myofascial Release  TPR to R lower traps/rhomboids       Trigger Point Dry Needling - 12/17/17 1031    Muscles Treated Upper Body  Longissimus;Quadratus Lumborum;Rhomboids L2-4 Bil multifidi; R QL; R rhomboids/low traps horizontally    Muscles Treated Lower Body  Gluteus minimus;Gluteus maximus;Piriformis glut med all R    Longissimus Response  Twitch response elicited;Palpable increased muscle length    Gluteus Maximus Response  Twitch response elicited;Palpable increased muscle length    Gluteus Minimus Response  Twitch response elicited;Palpable increased muscle length    Piriformis Response  Twitch response  elicited;Palpable increased muscle length                PT Long Term Goals - 12/17/17 0949      PT LONG TERM GOAL #1   Title  Independent with a HEP.    Time  6    Period  Weeks    Status  On-going      PT LONG TERM GOAL #2   Title  Sit 30 minutes with pain not > 3/10.    Baseline  still 4/10 at 30 min    Time  6    Period  Weeks    Status  On-going      PT LONG TERM GOAL #3   Title  Walk a community distance with pain not > 3/10.    Baseline  she can get 1/2 way through [facility] and then must rest or lean on wall    Time  6    Period  Weeks    Status  On-going      PT LONG TERM GOAL #4   Title  Perform ADL's with pain not > 3/10.    Baseline  ADLs not less than a 4/10 at this time    Time  6    Period  Weeks    Status  On-going            Plan - 12/17/17 1035    Clinical Impression Statement  Patient responded very well to her first session of DN reporting 60% improvement. We continued another session today primarily on the R side where pain persists. Patient responded well to this and to manual therapy in the R thoracic region. Patient would benefit from a few more sessions of DN to meet long term goals.    Rehab Potential  Good    PT Frequency  1x / week    PT Duration  4 weeks    PT Treatment/Interventions  ADLs/Self Care Home Management;Ultrasound;Traction;Moist Heat;Therapeutic activities;Therapeutic exercise;Patient/family education;Manual techniques;Dry needling    PT Next Visit Plan  Continue DN to low back, gluteals and R thoracic for up to 4 more visits; manual to same; add stretches    Consulted and Agree with Plan of Care  Patient       Patient will benefit from skilled therapeutic intervention in order to improve the following deficits and impairments:  Decreased activity tolerance, Decreased range of motion, Pain  Visit Diagnosis: Pain in thoracic spine - Plan: PT plan of care cert/re-cert  Chronic right-sided low back pain without  sciatica - Plan: PT plan of care cert/re-cert     Problem  List Patient Active Problem List   Diagnosis Date Noted  . History of renal cell carcinoma 12/03/2017  . Reactive depression 10/22/2017  . Change in bowel habits 10/07/2017  . Multiple thyroid nodules 08/21/2017  . Prediabetes 08/21/2017  . Gastroesophageal reflux disease without esophagitis 07/05/2017  . HTN (hypertension) 07/29/2014  . Hyperlipidemia 07/29/2014  . Syncope 07/29/2014  . Mixed stress and urge urinary incontinence 01/14/2014  . Recurrent UTI 01/14/2014  . Vaginal atrophy 01/14/2014  . Benign lesion of eyelid 02/07/2012    Madelyn Flavors PT 12/17/2017, 12:29 PM  [facility] [place], [place], [address] Phone: [phone]   Fax:  [phone]  Name: Rebecca Scott MRN: [id number] Date of Birth: 09-Nov-1955

## 2017-12-20 ENCOUNTER — Encounter: Payer: Self-pay | Admitting: Cardiology

## 2017-12-24 ENCOUNTER — Ambulatory Visit: Payer: 59 | Admitting: Physical Therapy

## 2017-12-24 DIAGNOSIS — M545 Low back pain: Secondary | ICD-10-CM

## 2017-12-24 DIAGNOSIS — M546 Pain in thoracic spine: Secondary | ICD-10-CM

## 2017-12-24 DIAGNOSIS — G8929 Other chronic pain: Secondary | ICD-10-CM

## 2017-12-24 NOTE — Therapy (Signed)
[facility] [place], [place], [address] Phone: [phone]   Fax:  [phone]  Physical Therapy Treatment  Patient Details  Name: HOLY BATTENFIELD MRN: [id number] Date of Birth: 22-Feb-1956 Referring Provider: Barnet Glasgow   Encounter Date: 12/24/2017  PT End of Session - 12/24/17 1037    Visit Number  13    Number of Visits  16    Date for PT Re-Evaluation  01/14/18    PT Start Time  1038    PT Stop Time  1133    PT Time Calculation (min)  55 min    Activity Tolerance  Patient tolerated treatment well;Patient limited by pain    Behavior During Therapy  [facility] for tasks assessed/performed       Past Medical History:  Diagnosis Date  . Anemia   . Asthma    seasonal asthma rarely   . Chronic kidney disease    renal mass right kidney   . CPAP (continuous positive airway pressure) dependence   . Diverticulitis   . GERD (gastroesophageal reflux disease)   . Hemorrhoids   . Hyperlipemia   . Hypertension   . Migraine   . 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 PONV (postoperative nausea and vomiting)    patient reports being "slower to wake than average "   . Pre-diabetes    "ive been told i'm borderline"   . Sleep apnea    CPAP use     Past Surgical History:  Procedure Laterality Date  . ABDOMINAL HYSTERECTOMY    . APPENDECTOMY  1978  . BIOPSY  07/08/2017   Procedure: BIOPSY;  Surgeon: Rogene Houston, MD;  [facility];  Service: Endoscopy;;  gastric  . CHOLECYSTECTOMY  2005  . COLONOSCOPY WITH PROPOFOL N/A 10/28/2017   Procedure: COLONOSCOPY WITH PROPOFOL;  Surgeon: Rogene Houston, MD;  [facility];  Service: Endoscopy;  Laterality: N/A;  7:30  . ESOPHAGOGASTRODUODENOSCOPY N/A 07/08/2017   Procedure: ESOPHAGOGASTRODUODENOSCOPY (EGD);  Surgeon: Rogene Houston, MD;  [facility];  Service: Endoscopy;  Laterality: N/A;  220  . NASAL  SEPTUM SURGERY    . ROBOTIC ASSITED PARTIAL NEPHRECTOMY Right 05/17/2017   Procedure: XI ROBOTIC ASSITED PARTIAL NEPHRECTOMY;  Surgeon: Alexis Frock, MD;  [facility];  Service: Urology;  Laterality: Right;    There were no vitals filed for this visit.  Subjective Assessment - 12/24/17 1038    Subjective  Patient reports improved pain for 3 days after last session with pain rated at 4/10. Then Friday night pain returned to 7/10.     Currently in Pain?  Yes    Pain Score  5     Pain Location  Back    Pain Orientation  Lower                       [facility] Adult PT Treatment/Exercise - 12/24/17 0001      Lumbar Exercises: Stretches   Quadruped Mid Back Stretch  2 reps;20 seconds    Other Lumbar Stretch Exercise  upper back stretch hands clasped protracting shoulders; also standing grasping post    Other Lumbar Stretch Exercise  doorway 3x30 sec      Lumbar Exercises: Supine   Other Supine Lumbar Exercises  thoracic ext over bolster and towel not tolerable      Lumbar Exercises: Quadruped   [place]  10 reps  5 sec hold      Modalities   Modalities  Electrical Stimulation;Moist Heat      Moist Heat Therapy   Number Minutes Moist Heat  15 Minutes    Moist Heat Location  Lumbar Spine;Other (comment) thoracic      Electrical Stimulation   Electrical Stimulation Location  R thoracic and R Lumbar    Electrical Stimulation Action  IFC     Electrical Stimulation Parameters  80-150 Hz x 33min    Electrical Stimulation Goals  Pain      Manual Therapy   Manual Therapy  Soft tissue mobilization    Soft tissue mobilization  to Bil thoracic and lumbar paraspinals       Trigger Point Dry Needling - 12/24/17 1252    Consent Given?  Yes    Muscles Treated Upper Body  Longissimus R longissimus; Bil multifidi T9-L4                PT Long Term Goals - 12/24/17 1400      PT LONG TERM GOAL #1   Title  Independent with a HEP.    Time  6    Period   Weeks    Status  On-going      PT LONG TERM GOAL #2   Title  Sit 30 minutes with pain not > 3/10.    Baseline  still 4/10 at 30 min    Time  6    Period  Weeks    Status  On-going      PT LONG TERM GOAL #3   Title  Walk a community distance with pain not > 3/10.    Baseline  she can get 1/2 way through [facility] and then must rest or lean on wall    Time  6    Period  Weeks    Status  On-going      PT LONG TERM GOAL #4   Title  Perform ADL's with pain not > 3/10.    Baseline  ADLs not less than a 4/10 at this time    Time  6    Period  Weeks    Status  On-going            Plan - 12/24/17 1257    Clinical Impression Statement  Patient tolerated stretches and TE fairly well today. She continues to report improvement with DN, however pain increased 3 days later this time. Overall pain is still lower. She has significant pain with thoracic extension but was able to tolerate upper back and chest stretches. Good response to DN. Decreased tone noted prior to DN.    Rehab Potential  Good    PT Frequency  1x / week    PT Duration  4 weeks    PT Treatment/Interventions  ADLs/Self Care Home Management;Ultrasound;Traction;Moist Heat;Therapeutic activities;Therapeutic exercise;Patient/family education;Manual techniques;Dry needling    PT Next Visit Plan  Continue DN to low back, gluteals and R thoracic for up to 4 more visits; manual to same; add stretches    PT Home Exercise Plan  cat/cow; upper back stretch; doorway    Consulted and Agree with Plan of Care  Patient       Patient will benefit from skilled therapeutic intervention in order to improve the following deficits and impairments:  Decreased activity tolerance, Decreased range of motion, Pain  Visit Diagnosis: Pain in thoracic spine  Chronic right-sided low back pain without sciatica     Problem List Patient Active Problem  List   Diagnosis Date Noted  . History of renal cell carcinoma 12/03/2017  . Reactive  depression 10/22/2017  . Change in bowel habits 10/07/2017  . Multiple thyroid nodules 08/21/2017  . Prediabetes 08/21/2017  . Gastroesophageal reflux disease without esophagitis 07/05/2017  . HTN (hypertension) 07/29/2014  . Hyperlipidemia 07/29/2014  . Syncope 07/29/2014  . Mixed stress and urge urinary incontinence 01/14/2014  . Recurrent UTI 01/14/2014  . Vaginal atrophy 01/14/2014  . Benign lesion of eyelid 02/07/2012    Madelyn Flavors PT 12/24/2017, 2:02 PM  [facility] [place], [place], [address] Phone: [phone]   Fax:  [phone]  Name: SHERETTA GRUMBINE MRN: [id number] Date of Birth: 09-15-1955

## 2017-12-30 ENCOUNTER — Other Ambulatory Visit (HOSPITAL_COMMUNITY): Payer: Self-pay | Admitting: *Deleted

## 2017-12-30 ENCOUNTER — Other Ambulatory Visit: Payer: Self-pay

## 2017-12-30 ENCOUNTER — Inpatient Hospital Stay (HOSPITAL_COMMUNITY): Payer: 59 | Attending: Hematology | Admitting: Hematology

## 2017-12-30 ENCOUNTER — Encounter (HOSPITAL_COMMUNITY): Payer: Self-pay | Admitting: Hematology

## 2017-12-30 ENCOUNTER — Encounter (HOSPITAL_COMMUNITY): Payer: Self-pay | Admitting: *Deleted

## 2017-12-30 VITALS — BP 115/80 | HR 63 | Temp 98.5°F | Resp 18 | Ht 63.0 in | Wt 215.3 lb

## 2017-12-30 DIAGNOSIS — K219 Gastro-esophageal reflux disease without esophagitis: Secondary | ICD-10-CM | POA: Insufficient documentation

## 2017-12-30 DIAGNOSIS — M545 Low back pain: Secondary | ICD-10-CM | POA: Diagnosis not present

## 2017-12-30 DIAGNOSIS — G894 Chronic pain syndrome: Secondary | ICD-10-CM | POA: Insufficient documentation

## 2017-12-30 DIAGNOSIS — J45909 Unspecified asthma, uncomplicated: Secondary | ICD-10-CM | POA: Diagnosis not present

## 2017-12-30 DIAGNOSIS — R1011 Right upper quadrant pain: Secondary | ICD-10-CM

## 2017-12-30 DIAGNOSIS — Z85528 Personal history of other malignant neoplasm of kidney: Secondary | ICD-10-CM

## 2017-12-30 DIAGNOSIS — Z7982 Long term (current) use of aspirin: Secondary | ICD-10-CM | POA: Insufficient documentation

## 2017-12-30 DIAGNOSIS — I129 Hypertensive chronic kidney disease with stage 1 through stage 4 chronic kidney disease, or unspecified chronic kidney disease: Secondary | ICD-10-CM | POA: Insufficient documentation

## 2017-12-30 DIAGNOSIS — Z79899 Other long term (current) drug therapy: Secondary | ICD-10-CM | POA: Diagnosis not present

## 2017-12-30 DIAGNOSIS — Z905 Acquired absence of kidney: Secondary | ICD-10-CM | POA: Diagnosis not present

## 2017-12-30 DIAGNOSIS — C641 Malignant neoplasm of right kidney, except renal pelvis: Secondary | ICD-10-CM | POA: Diagnosis not present

## 2017-12-30 DIAGNOSIS — G4730 Sleep apnea, unspecified: Secondary | ICD-10-CM | POA: Insufficient documentation

## 2017-12-30 DIAGNOSIS — R7303 Prediabetes: Secondary | ICD-10-CM | POA: Insufficient documentation

## 2017-12-30 DIAGNOSIS — E785 Hyperlipidemia, unspecified: Secondary | ICD-10-CM | POA: Insufficient documentation

## 2017-12-30 DIAGNOSIS — T50995D Adverse effect of other drugs, medicaments and biological substances, subsequent encounter: Secondary | ICD-10-CM

## 2017-12-30 DIAGNOSIS — N189 Chronic kidney disease, unspecified: Secondary | ICD-10-CM | POA: Insufficient documentation

## 2017-12-30 MED ORDER — DIPHENHYDRAMINE HCL 50 MG PO TABS: ORAL_TABLET | ORAL | 0 refills | Status: DC

## 2017-12-30 MED ORDER — METHYLPREDNISOLONE 32 MG PO TABS: ORAL_TABLET | ORAL | 0 refills | Status: DC

## 2017-12-30 NOTE — Progress Notes (Signed)
[facility] NOTE  Patient Care Team: Janora Norlander, DO as PCP - General (Family Medicine) Harl Bowie Alphonse Guild, MD as PCP - Cardiology (Cardiology) Gala Romney Cristopher Estimable, MD as Consulting Physician (Gastroenterology)  CHIEF COMPLAINTS/PURPOSE OF CONSULTATION:  Follow-up for renal cell cancer.  HISTORY OF PRESENTING ILLNESS:  Rebecca Scott 62 y.o. female is seen in consultation today for establishing follow-up after a diagnosis of right kidney cancer.  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  Since then she has been complaining of pain in the right loin region and right upper quadrant pain which radiates to the right loin.  She was also evaluated by pain management.  She underwent physical therapy and "dry needle therapy" with minimal help.  She was started on gabapentin, which she is taking 300 mg 3 times a day.  This seems to help a little bit.  She denies any headaches or vision changes.  She reports that she has been going through a lot of stress both at work and at home.  Pain has been slightly worse in the last 3 weeks.  She denies any fevers, night sweats or weight loss.  No recent hospitalizations were noted.  MEDICAL HISTORY:  Past Medical History:  Diagnosis Date  . Anemia   . Asthma    seasonal asthma rarely   . Chronic kidney disease    renal mass right kidney   . CPAP (continuous positive airway pressure) dependence   . Diverticulitis   . GERD (gastroesophageal reflux disease)   . Hemorrhoids   . Hyperlipemia   . Hypertension   . Migraine   . 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 PONV (postoperative nausea and vomiting)    patient reports being "slower to wake than average "   . Pre-diabetes    "ive been told i'm borderline"   . Sleep apnea    CPAP use     SURGICAL  HISTORY: Past Surgical History:  Procedure Laterality Date  . ABDOMINAL HYSTERECTOMY    . APPENDECTOMY  1978  . BIOPSY  07/08/2017   Procedure: BIOPSY;  Surgeon: Rogene Houston, MD;  [facility];  Service: Endoscopy;;  gastric  . CHOLECYSTECTOMY  2005  . COLONOSCOPY WITH PROPOFOL N/A 10/28/2017   Procedure: COLONOSCOPY WITH PROPOFOL;  Surgeon: Rogene Houston, MD;  [facility];  Service: Endoscopy;  Laterality: N/A;  7:30  . ESOPHAGOGASTRODUODENOSCOPY N/A 07/08/2017   Procedure: ESOPHAGOGASTRODUODENOSCOPY (EGD);  Surgeon: Rogene Houston, MD;  [facility];  Service: Endoscopy;  Laterality: N/A;  220  . NASAL SEPTUM SURGERY    . ROBOTIC ASSITED PARTIAL NEPHRECTOMY Right 05/17/2017   Procedure: XI ROBOTIC ASSITED PARTIAL NEPHRECTOMY;  Surgeon: Alexis Frock, MD;  [facility];  Service: Urology;  Laterality: Right;    SOCIAL HISTORY: Social History   Socioeconomic History  . Marital status: Widowed    Spouse name: Not on file  . Number of children: Not on file  . Years of education: Not on file  . Highest education level: Not on file  Occupational History  . Not on file  Social Needs  . Financial resource strain: Not on file  . Food insecurity:    Worry: Not on file    Inability: Not on file  . Transportation needs:    Medical:  Not on file    Non-medical: Not on file  Tobacco Use  . Smoking status: Never Smoker  . Smokeless tobacco: Never Used  Substance and Sexual Activity  . Alcohol use: Yes    Comment: rarely   . Drug use: No  . Sexual activity: Not Currently  Lifestyle  . Physical activity:    Days per week: Not on file    Minutes per session: Not on file  . Stress: Not on file  Relationships  . Social connections:    Talks on phone: Not on file    Gets together: Not on file    Attends religious service: Not on file    Active member of club or organization: Not on file    Attends meetings of clubs or organizations:  Not on file    Relationship status: Not on file  . Intimate partner violence:    Fear of current or ex partner: Not on file    Emotionally abused: Not on file    Physically abused: Not on file    Forced sexual activity: Not on file  Other Topics Concern  . Not on file  Social History Narrative  . Not on file    FAMILY HISTORY: Family History  Problem Relation Age of Onset  . COPD Mother   . Diabetes Mother   . Hypertension Mother   . High Cholesterol Mother   . Liver cancer Mother   . COPD Sister   . Hypertension Brother   . High Cholesterol Brother   . Hypertension Brother   . Asthma Son     ALLERGIES:  is allergic to iodinated diagnostic agents; isovue [iopamidol]; nitrofurantoin; other; sulfa antibiotics; adhesive [tape]; and mango flavor.  MEDICATIONS:  Current Outpatient Medications  Medication Sig Dispense Refill  . aspirin EC 81 MG tablet Take 1 tablet (81 mg total) by mouth daily.    . clonazePAM (KLONOPIN) 0.5 MG tablet Take 0.25 mg by mouth daily as needed.    . DULoxetine (CYMBALTA) 20 MG capsule Take 20 mg by mouth 2 (two) times daily.    . furosemide (LASIX) 20 MG tablet DAILEY AS NEEDED FOR SWELLING 90 tablet 1  . gabapentin (NEURONTIN) 100 MG capsule Take 300 mg by mouth 3 (three) times daily.   3  . HYDROcodone-acetaminophen (NORCO/VICODIN) 5-325 MG tablet Take 1-2 tablets by mouth every 6 (six) hours as needed for moderate pain. (Patient taking differently: Take 1 tablet by mouth daily as needed for moderate pain (very seldon uses). ) 20 tablet 0  . lidocaine (LIDODERM) 5 % Place 1 patch onto the skin daily. Remove & Discard patch within 12 hours or as directed by MD (Patient taking differently: Place 1 patch onto the skin daily as needed (pain). Remove & Discard patch within 12 hours or as directed by MD) 30 patch 1  . loratadine (CLARITIN) 10 MG tablet Take 10 mg by mouth daily as needed for allergies.    Marland Kitchen losartan (COZAAR) 100 MG tablet Take 1 tablet (100  mg total) by mouth daily. 90 tablet 1  . ondansetron (ZOFRAN ODT) 4 MG disintegrating tablet Take 1 tablet (4 mg total) every 8 (eight) hours as needed by mouth for nausea or vomiting. 4mg  ODT q4 hours prn nausea/vomit 30 tablet 0  . pantoprazole (PROTONIX) 40 MG tablet Take 1 tablet (40 mg total) by mouth 2 (two) times daily before a meal. 60 tablet 2  . potassium chloride SA (K-DUR,KLOR-CON) 20 MEQ tablet Take 20 mEq  by mouth daily.     . rosuvastatin (CRESTOR) 5 MG tablet Take 5 mg by mouth daily.      No current facility-administered medications for this visit.     REVIEW OF SYSTEMS:   Constitutional: Denies fevers, chills or abnormal night sweats Eyes: Denies blurriness of vision, double vision or watery eyes Ears, nose, mouth, throat, and face: Denies mucositis or sore throat Respiratory: Denies cough, dyspnea or wheezes Cardiovascular: Denies palpitation, chest discomfort or lower extremity swelling Gastrointestinal:  Denies nausea, heartburn or change in bowel habits Skin: Denies abnormal skin rashes Lymphatics: Denies new lymphadenopathy or easy bruising Neurological:Denies numbness, tingling or new weaknesses.  Complains of pain in the right lateral back. Behavioral/Psych: Mood is stable, no new changes  All other systems were reviewed with the patient and are negative.  PHYSICAL EXAMINATION: ECOG PERFORMANCE STATUS: 1 - Symptomatic but completely ambulatory  Vitals:   12/30/17 1148  BP: 115/80  Pulse: 63  Resp: 18  Temp: 98.5 F (36.9 C)  SpO2: 100%   Filed Weights   12/30/17 1148  Weight: 215 lb 4.8 oz (97.7 kg)    GENERAL:alert, no distress and comfortable SKIN: skin color, texture, turgor are normal, no rashes or significant lesions EYES: normal, conjunctiva are pink and non-injected, sclera clear OROPHARYNX:no exudate, no erythema and lips, buccal mucosa, and tongue normal  NECK: supple, thyroid normal size, non-tender, without nodularity LYMPH:  no palpable  lymphadenopathy in the cervical, axillary or inguinal LUNGS: clear to auscultation and percussion with normal breathing effort HEART: regular rate & rhythm and no murmurs and no lower extremity edema ABDOMEN:abdomen soft, non-tender and normal bowel sounds Musculoskeletal:no cyanosis of digits and no clubbing  PSYCH: alert & oriented x 3 with fluent speech NEURO: no focal motor/sensory deficits  LABORATORY DATA:  I have reviewed the data as listed Lab Results  Component Value Date   WBC 5.1 08/11/2017   HGB 10.8 (L) 08/11/2017   HCT 35.4 (L) 08/11/2017   MCV 89.6 08/11/2017   PLT 160 08/11/2017     Chemistry      Component Value Date/Time   NA 143 08/11/2017 1320   K 3.4 (L) 08/11/2017 1320   CL 109 08/11/2017 1320   CO2 29 08/11/2017 1320   BUN 11 08/11/2017 1320   CREATININE 0.68 08/11/2017 1320      Component Value Date/Time   CALCIUM 9.5 08/11/2017 1320   ALKPHOS 108 08/11/2017 1320   AST 25 08/11/2017 1320   ALT 25 08/11/2017 1320   BILITOT 0.6 08/11/2017 1320       RADIOGRAPHIC STUDIES: I have personally reviewed CT scan of the abdomen and pelvis on 10/04/2017, bone scan on 10/16/2017 [facility].  ASSESSMENT & PLAN:  History of renal cell carcinoma 1.  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  I have recommended surveillance visits with history and physical exam every 6 months for up to 2 years from surgery, then annually up to a total of 5 years.  We will check CT scan of the abdomen and pelvis once a year.  We will also  do a comprehensive metabolic  panel. -She has a thyroid biopsy scheduled this Friday as her thyroglobulin was elevated.  She is being followed by [facility]ocrinology at [facility].  2.  Chronic pain syndrome: She reports on and off pain in her right loin region and right upper quadrant which radiates to the back.  She had this pain since surgery.  None of the scans revealed any pathology.  She was seen by pain management.  She is currently taking Neurontin 300 mg 3 times a day which seemed to help.  She has an appointment with pain management in May.  She also underwent physical therapy and "dry needle" which is similar to acupuncture.  Orders Placed This Encounter  Procedures  . CT Abdomen Pelvis W Contrast    Standing Status:   Future    Standing Expiration Date:   12/30/2018    Order Specific Question:   If indicated for the ordered procedure, I authorize the administration of contrast media per Radiology protocol    Answer:   Yes    Order Specific Question:   Is patient pregnant?    Answer:   No    Order Specific Question:   Preferred imaging location?    Answer:   [facility]    Order Specific Question:   Is Oral Contrast requested for this exam?    Answer:   Per Radiology protocol    Order Specific Question:   Radiology Contrast Protocol - do NOT remove file path    Answer:   [email or web address]    Order Specific Question:   Reason for Exam additional comments    Answer:   Follow-up of stage I kidney cancer  . Comprehensive metabolic panel    Standing Status:   Future    Standing Expiration Date:   12/30/2018  . CBC with Differential    Standing Status:   Future    Standing Expiration Date:   12/30/2018    All questions were answered. The patient knows to call the clinic with any problems, questions or concerns.      Derek Jack, MD 12/30/2017 12:49 PM

## 2017-12-30 NOTE — Patient Instructions (Signed)
[facility] at [facility] Discharge Instructions  Today you saw Dr. K.   Thank you for choosing [facility] at [facility] to provide your oncology and hematology care.  To afford each patient quality time with our provider, please arrive at least 15 minutes before your scheduled appointment time.   If you have a lab appointment with the Cancer Center please come in thru the  Main Entrance and check in at the main information desk  You need to re-schedule your appointment should you arrive 10 or more minutes late.  We strive to give you quality time with our providers, and arriving late affects you and other patients whose appointments are after yours.  Also, if you no show three or more times for appointments you may be dismissed from the clinic at the providers discretion.     Again, thank you for choosing [facility].  Our hope is that these requests will decrease the amount of time that you wait before being seen by our physicians.       _____________________________________________________________  Should you have questions after your visit to [facility], please contact our office at [phone] between the hours of 8:30 a.m. and 4:30 p.m.  Voicemails left after 4:30 p.m. will not be returned until the following business day.  For prescription refill requests, have your pharmacy contact our office.       Resources For Cancer Patients and their Caregivers ? American Cancer Society: Can assist with transportation, wigs, general needs, runs Look Good Feel Better.        1-888-227-6333 ? Cancer Care: Provides financial assistance, online support groups, medication/co-pay assistance.  1-800-813-HOPE (4673) ? [facility] Assists [place] cancer patients and their families through emotional , educational and financial support.  336-427-4357 ? [place] DSS Where to apply for food  stamps, Medicaid and utility assistance. 336-342-1394 ? [facility]: Transportation to medical appointments. 336-347-2287 ? Social Security Administration: May apply for disability if have a Stage IV cancer. 336-342-7796 1-800-772-1213 ? [place] Aging, Disability and Transit Services: Assists with nutrition, care and transit needs. 336-349-2343  Cancer Center Support Programs:   > Cancer Support Group  2nd Tuesday of the month 1pm-2pm, Journey Room   > Creative Journey  3rd Tuesday of the month 1130am-1pm, Journey Room    

## 2017-12-30 NOTE — Progress Notes (Signed)
Methylprednisolone 32 mg tablet #3 and Benadryl 50mg  tablet #1 called into [facility] in [place]. I explained to patient how to take each medication prior to having her CT scan in October. She said okay, that these medications would be fine, and thanks for calling her with information. I asked her to call us back as the time got closer to her CT if she had any questions. She said she would.

## 2017-12-30 NOTE — Assessment & Plan Note (Addendum)
1.  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  I have recommended surveillance visits with history and physical exam every 6 months for up to 2 years from surgery, then annually up to a total of 5 years.  We will check CT scan of the abdomen and pelvis once a year.  We will also do a comprehensive metabolic panel. -She has a thyroid biopsy scheduled this Friday as her thyroglobulin was elevated.  She is being followed by endocrinology at [facility].  2.  Chronic pain syndrome: She reports on and off pain in her right loin region and right upper quadrant which radiates to the back.  She had this pain since surgery.  None of the scans revealed any pathology.  She was seen by pain management.  She is currently taking Neurontin 300 mg 3 times a day which seemed to help.  She has an appointment with pain management in May.  She also underwent physical therapy and "dry needle" which is similar to acupuncture.

## 2017-12-31 ENCOUNTER — Encounter: Payer: Self-pay | Admitting: General Practice

## 2017-12-31 NOTE — Progress Notes (Signed)
[facility] Psychosocial Distress Screening Clinical Social Work  Clinical Social Work was referred by distress screening protocol.  The patient scored a 4 on the Psychosocial Distress Thermometer which indicates mild distress. Clinical Social Worker Edwyna Shell to assess for distress and other psychosocial needs. CSW and patient discussed common feeling and emotions when being diagnosed with cancer, and the importance of support during treatment. CSW informed patient of the support team and support services at [facility]. CSW provided contact information and encouraged patient to call with any questions or concerns.  "I don't know where I am in my life right now. Don't know if I can feel anything right now."  Processed normal reactions to anxiety of initial diagnostic process and uncertainty re cancer as well as loss of two family members (husband and mother) to cancer.Attends [place] program at [employer], has some community within that program.  Some support from family/friends.  Concerned about impact of pain and illness on ability to continue to work.  Currently in physical therapy and at pain clinic for pain management.  Discussed ways to manage anxiety, declined referral for counseling however was interested in information on local community support agencies that have free programs.  Will mail information packet, encouraged patient to call as needed for support and resources.    ONCBCN DISTRESS SCREENING 12/30/2017  Screening Type Initial Screening  Distress experienced in past week (1-10) 4  Emotional problem type Depression;Nervousness/Anxiety;Adjusting to illness  Physical Problem type Nausea/vomiting;Sleep/insomnia;Pain;Loss of appetitie;Constipation/diarrhea  Physician notified of physical symptoms Yes  Referral to clinical psychology No  Referral to clinical social work No  Referral to dietition No  Referral to support programs No  Referral to palliative care No    Clinical Social Worker  follow up needed: No.  If yes, follow up plan:  Patient will recontact CSW as needed.  Edwyna Shell, LCSW Clinical Social Worker Phone:  [phone]

## 2018-01-01 ENCOUNTER — Ambulatory Visit: Payer: 59 | Admitting: Physical Therapy

## 2018-01-01 ENCOUNTER — Encounter: Payer: Self-pay | Admitting: Physical Therapy

## 2018-01-01 DIAGNOSIS — M545 Low back pain: Secondary | ICD-10-CM

## 2018-01-01 DIAGNOSIS — M546 Pain in thoracic spine: Secondary | ICD-10-CM

## 2018-01-01 DIAGNOSIS — G8929 Other chronic pain: Secondary | ICD-10-CM

## 2018-01-01 NOTE — Therapy (Signed)
[facility] [place], [place], [address] Phone: [phone]   Fax:  [phone]  Physical Therapy Treatment  Patient Details  Name: Rebecca Scott MRN: [id number] Date of Birth: 28-Jul-1956 Referring Provider: Barnet Glasgow   Encounter Date: 01/01/2018  PT End of Session - 01/01/18 0947    Visit Number  14    Number of Visits  16    Date for PT Re-Evaluation  01/14/18    PT Start Time  0948    PT Stop Time  1033    PT Time Calculation (min)  45 min    Activity Tolerance  Patient tolerated treatment well;Patient limited by pain    Behavior During Therapy  [facility] for tasks assessed/performed       Past Medical History:  Diagnosis Date  . Anemia   . Asthma    seasonal asthma rarely   . Chronic kidney disease    renal mass right kidney   . CPAP (continuous positive airway pressure) dependence   . Diverticulitis   . GERD (gastroesophageal reflux disease)   . Hemorrhoids   . Hyperlipemia   . Hypertension   . Migraine   . 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 PONV (postoperative nausea and vomiting)    patient reports being "slower to wake than average "   . Pre-diabetes    "ive been told i'm borderline"   . Sleep apnea    CPAP use     Past Surgical History:  Procedure Laterality Date  . ABDOMINAL HYSTERECTOMY    . APPENDECTOMY  1978  . BIOPSY  07/08/2017   Procedure: BIOPSY;  Surgeon: Rogene Houston, MD;  [facility];  Service: Endoscopy;;  gastric  . CHOLECYSTECTOMY  2005  . COLONOSCOPY WITH PROPOFOL N/A 10/28/2017   Procedure: COLONOSCOPY WITH PROPOFOL;  Surgeon: Rogene Houston, MD;  [facility];  Service: Endoscopy;  Laterality: N/A;  7:30  . ESOPHAGOGASTRODUODENOSCOPY N/A 07/08/2017   Procedure: ESOPHAGOGASTRODUODENOSCOPY (EGD);  Surgeon: Rogene Houston, MD;  [facility];  Service: Endoscopy;  Laterality: N/A;  220  . NASAL  SEPTUM SURGERY    . ROBOTIC ASSITED PARTIAL NEPHRECTOMY Right 05/17/2017   Procedure: XI ROBOTIC ASSITED PARTIAL NEPHRECTOMY;  Surgeon: Alexis Frock, MD;  [facility];  Service: Urology;  Laterality: Right;    There were no vitals filed for this visit.  Subjective Assessment - 01/01/18 0946    Subjective  Reports increased pain and only temporary (3-4 days) relief from DN.    Pertinent History  Partial kidney removal (2018); knee pain.    Limitations  Sitting    How long can you sit comfortably?  3 hours    How long can you walk comfortably?  Short community distances.    Diagnostic tests  Multilevel lumbar DDD.    Currently in Pain?  Yes    Pain Score  7     Pain Location  Back    Pain Orientation  Lower    Pain Descriptors / Indicators  Discomfort    Pain Type  Acute pain    Pain Onset  More than a month ago    Pain Frequency  Constant    Multiple Pain Sites  Yes    Pain Score  4    Pain Location  Back    Pain Orientation  Mid    Pain Descriptors / Indicators  Discomfort  Pain Type  Chronic pain    Pain Onset  More than a month ago         [facility] PT Assessment - 01/01/18 0001      Assessment   Medical Diagnosis  Thoracic and low back pain.    Next MD Visit  01/28/2018      Precautions   Precautions  None                   [facility] Adult PT Treatment/Exercise - 01/01/18 0001      Modalities   Modalities  Electrical Stimulation;Moist Heat;Ultrasound      Moist Heat Therapy   Number Minutes Moist Heat  15 Minutes    Moist Heat Location  Lumbar Spine      Electrical Stimulation   Electrical Stimulation Location  R thoracolumbar paraspinals    Electrical Stimulation Action  Pre-Mod    Electrical Stimulation Parameters  1-10 hz x15 min    Electrical Stimulation Goals  Pain      Ultrasound   Ultrasound Location  R thoracolumbar parspinals, QL    Ultrasound Parameters  1.5 w/cm2, 10)%, 1 mhz x10 min    Ultrasound Goals  Pain      Manual Therapy    Manual Therapy  Soft tissue mobilization    Soft tissue mobilization  STW to R thoracolumbar paraspinals, QL to reduce muscle tightness and pain                  PT Long Term Goals - 12/24/17 1400      PT LONG TERM GOAL #1   Title  Independent with a HEP.    Time  6    Period  Weeks    Status  On-going      PT LONG TERM GOAL #2   Title  Sit 30 minutes with pain not > 3/10.    Baseline  still 4/10 at 30 min    Time  6    Period  Weeks    Status  On-going      PT LONG TERM GOAL #3   Title  Walk a community distance with pain not > 3/10.    Baseline  she can get 1/2 way through [facility] and then must rest or lean on wall    Time  6    Period  Weeks    Status  On-going      PT LONG TERM GOAL #4   Title  Perform ADL's with pain not > 3/10.    Baseline  ADLs not less than a 4/10 at this time    Time  6    Period  Weeks    Status  On-going            Plan - 01/01/18 1039    Clinical Impression Statement  Patient tolerated today's treatment well as more conservative treatment completed in hopes of reducing her pain. Patient unable to detect and long term benefits from PT or DN. Minimal increased muscle tightness palpated to the R thoracolumbar paraspinals and QL upon comparison of L thoracolumbar musculature. No reports of any increased sensitivity or tenderness during manual therapy. Normal modalities response noted following removal of the modalities. Patient urged to contact her referring MD in regards to lack of progress during treatment.    Rehab Potential  Good    PT Frequency  1x / week    PT Duration  4 weeks    PT Treatment/Interventions  ADLs/Self  Care Home Management;Ultrasound;Traction;Moist Heat;Therapeutic activities;Therapeutic exercise;Patient/family education;Manual techniques;Dry needling    PT Next Visit Plan  Continue DN to low back, gluteals and R thoracic for up to 4 more visits; manual to same; add stretches    PT Home Exercise Plan   cat/cow; upper back stretch; doorway    Consulted and Agree with Plan of Care  Patient       Patient will benefit from skilled therapeutic intervention in order to improve the following deficits and impairments:  Decreased activity tolerance, Decreased range of motion, Pain  Visit Diagnosis: Pain in thoracic spine  Chronic right-sided low back pain without sciatica     Problem List Patient Active Problem List   Diagnosis Date Noted  . History of renal cell carcinoma 12/03/2017  . Reactive depression 10/22/2017  . Change in bowel habits 10/07/2017  . Multiple thyroid nodules 08/21/2017  . Prediabetes 08/21/2017  . Gastroesophageal reflux disease without esophagitis 07/05/2017  . HTN (hypertension) 07/29/2014  . Hyperlipidemia 07/29/2014  . Syncope 07/29/2014  . Mixed stress and urge urinary incontinence 01/14/2014  . Recurrent UTI 01/14/2014  . Vaginal atrophy 01/14/2014  . Benign lesion of eyelid 02/07/2012    Standley Brooking, PTA 01/01/2018, 10:48 AM  [facility] [place], [place], [address] Phone: [phone]   Fax:  [phone]  Name: Rebecca Scott MRN: [id number] Date of Birth: [birth year]

## 2018-01-07 ENCOUNTER — Ambulatory Visit: Payer: 59 | Admitting: Physical Therapy

## 2018-01-07 DIAGNOSIS — M546 Pain in thoracic spine: Secondary | ICD-10-CM | POA: Diagnosis not present

## 2018-01-07 DIAGNOSIS — M545 Low back pain: Principal | ICD-10-CM

## 2018-01-07 DIAGNOSIS — G8929 Other chronic pain: Secondary | ICD-10-CM

## 2018-01-07 NOTE — Patient Instructions (Signed)
Pelvic Press  [id number]   Place hands under belly between navel and pubic bone, palms up. Feel pressure on hands. Increase pressure on hands by pressing pelvis down. This is NOT a pelvic tilt. Hold __5_ seconds. Relax. Repeat _10__ times. Once a day.  KNEE: Flexion - Prone   Hold pelvic press. Bend knee. Raise heel toward buttocks. Repeat on opposite leg. Do not raise hips. _10__ reps per set. When this is mastered, pull both heels up at same time, x 10 reps.  Once a day   Hip Extension (Prone)  Hold pelvic press  Lift left leg _3___ inches from floor, keeping knee locked. Repeat __10__ times per set. Do _1___ sets per session. Do _10___ sessions per day.  HIP: Extension / KNEE: Flexion - Prone    Hold pelvic press. Bend knee, squeeze glutes. Raise leg up  10___ reps per set, _2__ sets per day, _5__ days per week   Rebecca Scott, PT 01/07/18 11:18 AM; [facility] [place], [place], [address] Phone: [phone]   Fax:  [phone]

## 2018-01-07 NOTE — Therapy (Signed)
[facility] [place], [place], [address] Phone: [phone]   Fax:  [phone]  Physical Therapy Treatment  Patient Details  Name: Rebecca Scott MRN: [id number] Date of Birth: 1956/01/11 Referring Provider: Barnet Glasgow   Encounter Date: 01/07/2018  PT End of Session - 01/07/18 1039    Visit Number  15    Number of Visits  16    Date for PT Re-Evaluation  01/14/18    PT Start Time  1033    PT Stop Time  1131    PT Time Calculation (min)  58 min    Activity Tolerance  Patient tolerated treatment well;Patient limited by pain    Behavior During Therapy  [facility] for tasks assessed/performed       Past Medical History:  Diagnosis Date  . Anemia   . Asthma    seasonal asthma rarely   . Chronic kidney disease    renal mass right kidney   . CPAP (continuous positive airway pressure) dependence   . Diverticulitis   . GERD (gastroesophageal reflux disease)   . Hemorrhoids   . Hyperlipemia   . Hypertension   . Migraine   . 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 PONV (postoperative nausea and vomiting)    patient reports being "slower to wake than average "   . Pre-diabetes    "ive been told i'm borderline"   . Sleep apnea    CPAP use     Past Surgical History:  Procedure Laterality Date  . ABDOMINAL HYSTERECTOMY    . APPENDECTOMY  1978  . BIOPSY  07/08/2017   Procedure: BIOPSY;  Surgeon: Rogene Houston, MD;  [facility];  Service: Endoscopy;;  gastric  . CHOLECYSTECTOMY  2005  . COLONOSCOPY WITH PROPOFOL N/A 10/28/2017   Procedure: COLONOSCOPY WITH PROPOFOL;  Surgeon: Rogene Houston, MD;  [facility];  Service: Endoscopy;  Laterality: N/A;  7:30  . ESOPHAGOGASTRODUODENOSCOPY N/A 07/08/2017   Procedure: ESOPHAGOGASTRODUODENOSCOPY (EGD);  Surgeon: Rogene Houston, MD;  [facility];  Service: Endoscopy;  Laterality: N/A;  220  . NASAL  SEPTUM SURGERY    . ROBOTIC ASSITED PARTIAL NEPHRECTOMY Right 05/17/2017   Procedure: XI ROBOTIC ASSITED PARTIAL NEPHRECTOMY;  Surgeon: Alexis Frock, MD;  [facility];  Service: Urology;  Laterality: Right;    There were no vitals filed for this visit.  Subjective Assessment - 01/07/18 1039    Currently in Pain?  Yes    Pain Score  5     Pain Location  Back    Pain Orientation  Lower    Pain Descriptors / Indicators  Shooting                       [facility] Adult PT Treatment/Exercise - 01/07/18 0001      Lumbar Exercises: Supine   Ab Set  Limitations    AB Set Limitations  attempted but pt's back flared up from prone exercises      Lumbar Exercises: Prone   Straight Leg Raise  Limitations    Straight Leg Raises Limitations  too painful in low back    Other Prone Lumbar Exercises  Pelvic press series 5 reps x 5 sec, unilat and bil knee bends x 5 each      Modalities   Modalities  Electrical Stimulation;Moist Heat      Moist Heat Therapy  Number Minutes Moist Heat  15 Minutes    Moist Heat Location  Lumbar Spine;Other (comment) gluteals      Electrical Stimulation   Electrical Stimulation Location  Bil lumbar and R gluteals    Electrical Stimulation Action  pre mod    Electrical Stimulation Parameters  80-150 Hz x 15 min    Electrical Stimulation Goals  Pain      Manual Therapy   Manual Therapy  Soft tissue mobilization    Soft tissue mobilization  to bil lumbar and R gluteals       Trigger Point Dry Needling - 01/07/18 1225    Consent Given?  Yes    Muscles Treated Upper Body  Longissimus L4/5 Bil multifidi    Muscles Treated Lower Body  Gluteus minimus;Gluteus maximus R    Longissimus Response  Twitch response elicited;Palpable increased muscle length    Gluteus Maximus Response  Twitch response elicited;Palpable increased muscle length    Gluteus Minimus Response  Twitch response elicited;Palpable increased muscle length and Glut med     Piriformis Response  Twitch response elicited;Palpable increased muscle length           PT Education - 01/07/18 1222    Education provided  Yes    Education Details  pelvic press series    Person(s) Educated  Patient    Methods  Explanation;Demonstration;Tactile cues;Verbal cues    Comprehension  Verbalized understanding;Returned demonstration          PT Long Term Goals - 12/24/17 1400      PT LONG TERM GOAL #1   Title  Independent with a HEP.    Time  6    Period  Weeks    Status  On-going      PT LONG TERM GOAL #2   Title  Sit 30 minutes with pain not > 3/10.    Baseline  still 4/10 at 30 min    Time  6    Period  Weeks    Status  On-going      PT LONG TERM GOAL #3   Title  Walk a community distance with pain not > 3/10.    Baseline  she can get 1/2 way through [facility] and then must rest or lean on wall    Time  6    Period  Weeks    Status  On-going      PT LONG TERM GOAL #4   Title  Perform ADL's with pain not > 3/10.    Baseline  ADLs not less than a 4/10 at this time    Time  6    Period  Weeks    Status  On-going            Plan - 01/07/18 1227    Clinical Impression Statement  Patient responded very well to DN today in the lumbar spine and gluteals. She has marked tightness in R gluteals and self MFR was encouraged.     PT Treatment/Interventions  ADLs/Self Care Home Management;Ultrasound;Traction;Moist Heat;Therapeutic activities;Therapeutic exercise;Patient/family education;Manual techniques;Dry needling    PT Next Visit Plan  Continue DN to low back and gluteals; manual to same; add gluteal/pririformis stretches.    Consulted and Agree with Plan of Care  Patient       Patient will benefit from skilled therapeutic intervention in order to improve the following deficits and impairments:  Decreased activity tolerance, Decreased range of motion, Pain  Visit Diagnosis: Chronic right-sided low back pain without sciatica  Problem  List Patient Active Problem List   Diagnosis Date Noted  . History of renal cell carcinoma 12/03/2017  . Reactive depression 10/22/2017  . Change in bowel habits 10/07/2017  . Multiple thyroid nodules 08/21/2017  . Prediabetes 08/21/2017  . Gastroesophageal reflux disease without esophagitis 07/05/2017  . HTN (hypertension) 07/29/2014  . Hyperlipidemia 07/29/2014  . Syncope 07/29/2014  . Mixed stress and urge urinary incontinence 01/14/2014  . Recurrent UTI 01/14/2014  . Vaginal atrophy 01/14/2014  . Benign lesion of eyelid 02/07/2012    Madelyn Flavors PT 01/07/2018, 12:30 PM  [facility] [place], [place], [address] Phone: [phone]   Fax:  [phone]  Name: Rebecca Scott MRN: [id number] Date of Birth: 28-Oct-1955

## 2018-01-17 ENCOUNTER — Ambulatory Visit: Payer: 59 | Attending: Anesthesiology | Admitting: Physical Therapy

## 2018-01-17 DIAGNOSIS — M545 Low back pain: Secondary | ICD-10-CM | POA: Diagnosis not present

## 2018-01-17 DIAGNOSIS — M546 Pain in thoracic spine: Secondary | ICD-10-CM | POA: Diagnosis present

## 2018-01-17 DIAGNOSIS — G8929 Other chronic pain: Secondary | ICD-10-CM | POA: Insufficient documentation

## 2018-01-17 NOTE — Therapy (Addendum)
[facility] [place], [place], [address] Phone: [phone]   Fax:  [phone]  Physical Therapy Treatment  Patient Details  Name: Rebecca Scott MRN: [id number] Date of Birth: 18-Oct-1955 Referring Provider: Barnet Glasgow   Encounter Date: 01/17/2018  PT End of Session - 01/17/18 1038    Visit Number  16    Number of Visits  17    Date for PT Re-Evaluation  01/14/18    PT Start Time  1037    PT Stop Time  1131    PT Time Calculation (min)  54 min    Activity Tolerance  Patient tolerated treatment well    Behavior During Therapy  [facility] for tasks assessed/performed       Past Medical History:  Diagnosis Date  . Anemia   . Asthma    seasonal asthma rarely   . Chronic kidney disease    renal mass right kidney   . CPAP (continuous positive airway pressure) dependence   . Diverticulitis   . GERD (gastroesophageal reflux disease)   . Hemorrhoids   . Hyperlipemia   . Hypertension   . Migraine   . 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 PONV (postoperative nausea and vomiting)    patient reports being "slower to wake than average "   . Pre-diabetes    "ive been told i'm borderline"   . Sleep apnea    CPAP use     Past Surgical History:  Procedure Laterality Date  . ABDOMINAL HYSTERECTOMY    . APPENDECTOMY  1978  . BIOPSY  07/08/2017   Procedure: BIOPSY;  Surgeon: Rogene Houston, MD;  [facility];  Service: Endoscopy;;  gastric  . CHOLECYSTECTOMY  2005  . COLONOSCOPY WITH PROPOFOL N/A 10/28/2017   Procedure: COLONOSCOPY WITH PROPOFOL;  Surgeon: Rogene Houston, MD;  [facility];  Service: Endoscopy;  Laterality: N/A;  7:30  . ESOPHAGOGASTRODUODENOSCOPY N/A 07/08/2017   Procedure: ESOPHAGOGASTRODUODENOSCOPY (EGD);  Surgeon: Rogene Houston, MD;  [facility];  Service: Endoscopy;  Laterality: N/A;  220  . NASAL SEPTUM SURGERY    .  ROBOTIC ASSITED PARTIAL NEPHRECTOMY Right 05/17/2017   Procedure: XI ROBOTIC ASSITED PARTIAL NEPHRECTOMY;  Surgeon: Alexis Frock, MD;  [facility];  Service: Urology;  Laterality: Right;    There were no vitals filed for this visit.  Subjective Assessment - 01/17/18 1039    Subjective  Patient reports relief for 4-5 days Pain returned Sun/Monday of this past weeks.    Pertinent History  Partial kidney removal (2018); knee pain.    Currently in Pain?  Yes    Pain Score  5     Pain Location  Back    Pain Orientation  Right;Lower;Left    Pain Descriptors / Indicators  Shooting                       [facility] Adult PT Treatment/Exercise - 01/17/18 0001      Moist Heat Therapy   Number Minutes Moist Heat  15 Minutes    Moist Heat Location  Lumbar Spine      Electrical Stimulation   Electrical Stimulation Location  Bil lumbar and R gluteals    Electrical Stimulation Action  premod 80-150 Hz x 15 min    Electrical Stimulation Goals  Pain      Manual Therapy   Manual Therapy  Soft  tissue mobilization    Soft tissue mobilization  to Bil lumbar, R QL and R gluteals       Trigger Point Dry Needling - 01/17/18 1145    Consent Given?  Yes    Muscles Treated Upper Body  Longissimus;Quadratus Lumborum R QL and Bil multifidi L 2-4, R L5 also    Muscles Treated Lower Body  Gluteus minimus;Gluteus maximus;Piriformis R, glut med and along SIJ    Longissimus Response  Twitch response elicited;Palpable increased muscle length    Gluteus Maximus Response  Twitch response elicited;Palpable increased muscle length    Gluteus Minimus Response  Twitch response elicited;Palpable increased muscle length    Piriformis Response  Twitch response elicited;Palpable increased muscle length                PT Long Term Goals - 12/24/17 1400      PT LONG TERM GOAL #1   Title  Independent with a HEP.    Time  6    Period  Weeks    Status  On-going      PT LONG TERM GOAL #2    Title  Sit 30 minutes with pain not > 3/10.    Baseline  still 4/10 at 30 min    Time  6    Period  Weeks    Status  On-going      PT LONG TERM GOAL #3   Title  Walk a community distance with pain not > 3/10.    Baseline  she can get 1/2 way through [facility] and then must rest or lean on wall    Time  6    Period  Weeks    Status  On-going      PT LONG TERM GOAL #4   Title  Perform ADL's with pain not > 3/10.    Baseline  ADLs not less than a 4/10 at this time    Time  6    Period  Weeks    Status  On-going            Plan - 01/17/18 1151    Clinical Impression Statement  Patient had very good responses today with DN to lumbar multifidi and gluteals along SIJ. She continues to have significant TPs in gluteals, but relief from DN lasted longer after last treatment.    PT Treatment/Interventions  ADLs/Self Care Home Management;Ultrasound;Traction;Moist Heat;Therapeutic activities;Therapeutic exercise;Patient/family education;Manual techniques;Dry needling    PT Next Visit Plan  D/C to HEP; Continue DN to low back and gluteals; manual to same; add gluteal/pririformis stretches.       Patient will benefit from skilled therapeutic intervention in order to improve the following deficits and impairments:  Decreased activity tolerance, Decreased range of motion, Pain  Visit Diagnosis: Chronic right-sided low back pain without sciatica  Pain in thoracic spine     Problem List Patient Active Problem List   Diagnosis Date Noted  . History of renal cell carcinoma 12/03/2017  . Reactive depression 10/22/2017  . Change in bowel habits 10/07/2017  . Multiple thyroid nodules 08/21/2017  . Prediabetes 08/21/2017  . Gastroesophageal reflux disease without esophagitis 07/05/2017  . HTN (hypertension) 07/29/2014  . Hyperlipidemia 07/29/2014  . Syncope 07/29/2014  . Mixed stress and urge urinary incontinence 01/14/2014  . Recurrent UTI 01/14/2014  . Vaginal atrophy 01/14/2014  .  Benign lesion of eyelid 02/07/2012    Madelyn Flavors PT 01/17/2018, 12:07 PM  [facility] [place],  [place], [address] Phone: [phone]   Fax:  [phone]  Name: Rebecca Scott MRN: [id number] Date of Birth: [birth year]  PHYSICAL THERAPY DISCHARGE SUMMARY  Visits from Start of Care: 16.  Current functional level related to goals / functional outcomes: See above.   Remaining deficits: Continued low back pain.  LTG's unmet.   Education / Equipment: HEP. Plan: Patient agrees to discharge.  Patient goals were not met. Patient is being discharged due to lack of progress.  ?????         Mali Applegate MPT

## 2018-01-20 ENCOUNTER — Encounter: Payer: Self-pay | Admitting: Family Medicine

## 2018-01-20 ENCOUNTER — Ambulatory Visit (INDEPENDENT_AMBULATORY_CARE_PROVIDER_SITE_OTHER): Payer: 59 | Admitting: Family Medicine

## 2018-01-20 ENCOUNTER — Ambulatory Visit (INDEPENDENT_AMBULATORY_CARE_PROVIDER_SITE_OTHER): Payer: 59

## 2018-01-20 VITALS — BP 140/83 | HR 63 | Temp 98.4°F | Wt 216.0 lb

## 2018-01-20 DIAGNOSIS — M546 Pain in thoracic spine: Secondary | ICD-10-CM

## 2018-01-20 DIAGNOSIS — R110 Nausea: Secondary | ICD-10-CM | POA: Diagnosis not present

## 2018-01-20 DIAGNOSIS — R82998 Other abnormal findings in urine: Secondary | ICD-10-CM | POA: Diagnosis not present

## 2018-01-20 DIAGNOSIS — R1011 Right upper quadrant pain: Secondary | ICD-10-CM | POA: Diagnosis not present

## 2018-01-20 DIAGNOSIS — E042 Nontoxic multinodular goiter: Secondary | ICD-10-CM

## 2018-01-20 LAB — URINALYSIS, COMPLETE
Bilirubin, UA: NEGATIVE
Glucose, UA: NEGATIVE
KETONES UA: NEGATIVE
LEUKOCYTES UA: NEGATIVE
NITRITE UA: NEGATIVE
Protein, UA: NEGATIVE
RBC, UA: NEGATIVE
Specific Gravity, UA: 1.01 (ref 1.005–1.030)
Urobilinogen, Ur: 0.2 mg/dL (ref 0.2–1.0)
pH, UA: 6.5 (ref 5.0–7.5)

## 2018-01-20 MED ORDER — ONDANSETRON 4 MG PO TBDP: 4.0000 mg | ORAL_TABLET | Freq: Once | ORAL | Status: DC

## 2018-01-20 NOTE — Progress Notes (Signed)
Subjective: CC: RUQ/ Right sided back pain HPI: Rebecca Scott is a 62 y.o. female presenting to clinic today for:  1. RUQ/ right sided back pain Patient reports an acute onset of right upper quadrant and right-sided thoracic pain.  She reports associated nausea and dry heaving.  She notes that she had very dark urine over the last couple of days.  No myalgia.  She notes that she has had abnormal bowel movements for over a year, citing that she alternates between constipation and diarrhea.  Surgical history is significant for appendectomy and cholecystectomy.  Denies any hematochezia, melena.  She became nauseated even with water this morning.  She has not used any Zofran for symptoms.  2.  Hand and foot swelling Patient is this is been ongoing for months.  She saw her cardiologist, Dr. Harl Bowie who prescribed Lasix.  She notes that the hand and foot swelling actually seem worse since starting Lasix.  She continues to have substantial swelling that occurs daily.  She reports dark urine as above.  No other medication changes.  3. Thyroid nodules Patient saw her endocrinologist, who performed a thyroid ultrasound with biopsy.  She notes that she was told that the biopsy results were inconclusive and potentially could be cancer.  She reported that plan was to follow-up with ultrasound in 6 months or she can have a repeat biopsy with genetic testing before then.  Patient would like to have a second opinion and establish with a new endocrinologist since her current endocrinologist will be retiring soon.  She is worried, particularly given history of renal cell carcinoma.  She would like to establish with Dr. Buddy Duty, who 1 of her friends sees.  ROS: Per HPI  Past Medical History:  Diagnosis Date  . Anemia   . Asthma    seasonal asthma rarely   . Chronic kidney disease    renal mass right kidney   . CPAP (continuous positive airway pressure) dependence   . Diverticulitis   . GERD  (gastroesophageal reflux disease)   . Hemorrhoids   . Hyperlipemia   . Hypertension   . Migraine   . 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 PONV (postoperative nausea and vomiting)    patient reports being "slower to wake than average "   . Pre-diabetes    "ive been told i'm borderline"   . Sleep apnea    CPAP use    Allergies  Allergen Reactions  . Iodinated Diagnostic Agents Hives and Itching  . Isovue [Iopamidol] Itching    And hives   . Nitrofurantoin Hives  . Other Hives, Itching, Other (See Comments) and Rash    Paper tape ONLY  . Sulfa Antibiotics Hives  . Adhesive [Tape] Rash and Other (See Comments)    Paper tape ONLY  . Mango Flavor Swelling and Rash    Tongue swelling, feels like throat is closing in on her     Current Outpatient Medications:  .  aspirin EC 81 MG tablet, Take 1 tablet (81 mg total) by mouth daily., Disp: , Rfl:  .  DULoxetine (CYMBALTA) 20 MG capsule, Take 20 mg by mouth 2 (two) times daily., Disp: , Rfl:  .  furosemide (LASIX) 20 MG tablet, DAILEY AS NEEDED FOR SWELLING, Disp: 90 tablet, Rfl: 1 .  gabapentin (NEURONTIN) 100 MG capsule, Take 300 mg by mouth 3 (three) times daily. , Disp: , Rfl: 3 .  lidocaine (LIDODERM) 5 %, Place  1 patch onto the skin daily. Remove & Discard patch within 12 hours or as directed by MD (Patient taking differently: Place 1 patch onto the skin daily as needed (pain). Remove & Discard patch within 12 hours or as directed by MD), Disp: 30 patch, Rfl: 1 .  loratadine (CLARITIN) 10 MG tablet, Take 10 mg by mouth daily as needed for allergies., Disp: , Rfl:  .  losartan (COZAAR) 100 MG tablet, Take 1 tablet (100 mg total) by mouth daily., Disp: 90 tablet, Rfl: 1 .  ondansetron (ZOFRAN ODT) 4 MG disintegrating tablet, Take 1 tablet (4 mg total) every 8 (eight) hours as needed by mouth for nausea or vomiting. 4m ODT q4 hours prn nausea/vomit, Disp: 30 tablet, Rfl: 0 .  pantoprazole  (PROTONIX) 40 MG tablet, Take 1 tablet (40 mg total) by mouth 2 (two) times daily before a meal., Disp: 60 tablet, Rfl: 2 .  potassium chloride SA (K-DUR,KLOR-CON) 20 MEQ tablet, Take 20 mEq by mouth daily. , Disp: , Rfl:  .  rosuvastatin (CRESTOR) 5 MG tablet, Take 5 mg by mouth daily. , Disp: , Rfl:  Social History   Socioeconomic History  . Marital status: Widowed    Spouse name: Not on file  . Number of children: Not on file  . Years of education: Not on file  . Highest education level: Not on file  Occupational History  . Not on file  Social Needs  . Financial resource strain: Not on file  . Food insecurity:    Worry: Not on file    Inability: Not on file  . Transportation needs:    Medical: Not on file    Non-medical: Not on file  Tobacco Use  . Smoking status: Never Smoker  . Smokeless tobacco: Never Used  Substance and Sexual Activity  . Alcohol use: Yes    Comment: rarely   . Drug use: No  . Sexual activity: Not Currently  Lifestyle  . Physical activity:    Days per week: Not on file    Minutes per session: Not on file  . Stress: Not on file  Relationships  . Social connections:    Talks on phone: Not on file    Gets together: Not on file    Attends religious service: Not on file    Active member of club or organization: Not on file    Attends meetings of clubs or organizations: Not on file    Relationship status: Not on file  . Intimate partner violence:    Fear of current or ex partner: Not on file    Emotionally abused: Not on file    Physically abused: Not on file    Forced sexual activity: Not on file  Other Topics Concern  . Not on file  Social History Narrative  . Not on file   Family History  Problem Relation Age of Onset  . COPD Mother   . Diabetes Mother   . Hypertension Mother   . High Cholesterol Mother   . Liver cancer Mother   . COPD Sister   . Hypertension Brother   . High Cholesterol Brother   . Hypertension Brother   . Asthma  Son      Objective: Office vital signs reviewed. BP 140/83   Pulse 63   Temp 98.4 F (36.9 C)   Wt 216 lb (98 kg)   BMI 38.26 kg/m   Physical Examination:  General: Awake, alert, obese, anxious appearing GOZ:YYQMG soft, +  TTP in the RUQ and RLQ. No guarding, rebound tenderness. Non-distended, bowel sounds present x4, no hepatomegaly, no splenomegaly, no palpable masses GU: no suprapubic TTP; mild right sided CVA TTP Extremities: warm, well perfused, No edema, cyanosis or clubbing; +2 pulses bilaterally; no gross deformities appreciated within the hands.  No substantial hand swelling noted. MSK: normal gait and normal station Skin: dry; intact; no rashes or lesions  Assessment/ Plan: 62 y.o. female   1. Dark brown urine Possibly related to dehydration.  However, urinalysis was totally normal today.  No evidence of ketone formation or protein.  Color was normal.  pH normal.  No evidence of infection.  KUB was obtained to rule out renal stents that she is has a history of these.  No appreciable renal stones noted.  She does have moderate stool within the colon, which could be contributing to abdominal and back pain.  Check CMP. - Urinalysis, Complete - CMP14+EGFR - DG Abd 1 View; Future  2. Multiple thyroid nodules Wants to establish with Dr Buddy Duty.  Referral placed. - Ambulatory referral to Endocrinology  3. Right-sided thoracic back pain, unspecified chronicity Moderate stool within the colon noted.  Possible contributing.  This is the side that patient has had surgery on in the past as well.  She has had pain on this side in the past.  She is currently in PT/ seeing pain management for this.  Start Miralax daily to regulate stools.  Increase water intake.    4. RUQ pain KUB with stool.  She was fairly tender on today's exam.  Will check abdominal ultrasound to rule out any possible retained stones in the duct.  She had a cholecystectomy and appendectomy in the past.  No evidence  of acute abdomen on today's exam.  Orders Placed This Encounter  Procedures  . DG Abd 1 View  . US Abdomen Limited RUQ  . Urinalysis, Complete  . CMP14+EGFR  . Ambulatory referral to Endocrinology   Patient given 1 dose of Zofran ODT 4 mg here in office.  Janora Norlander, DO [place] [phone]

## 2018-01-21 ENCOUNTER — Encounter: Payer: 59 | Admitting: Physical Therapy

## 2018-01-21 LAB — CMP14+EGFR
A/G RATIO: 1.3 (ref 1.2–2.2)
ALBUMIN: 3.7 g/dL (ref 3.6–4.8)
ALK PHOS: 109 IU/L (ref 39–117)
ALT: 29 IU/L (ref 0–32)
AST: 19 IU/L (ref 0–40)
BILIRUBIN TOTAL: 0.3 mg/dL (ref 0.0–1.2)
BUN/Creatinine Ratio: 17 (ref 12–28)
BUN: 12 mg/dL (ref 8–27)
CO2: 20 mmol/L (ref 20–29)
Calcium: 9 mg/dL (ref 8.7–10.3)
Chloride: 106 mmol/L (ref 96–106)
Creatinine, Ser: 0.69 mg/dL (ref 0.57–1.00)
GFR calc Af Amer: 109 mL/min/{1.73_m2} (ref >59)
GFR calc non Af Amer: 95 mL/min/{1.73_m2} (ref >59)
GLOBULIN, TOTAL: 2.8 g/dL (ref 1.5–4.5)
Glucose: 135 mg/dL — ABNORMAL HIGH (ref 65–99)
POTASSIUM: 4 mmol/L (ref 3.5–5.2)
SODIUM: 141 mmol/L (ref 134–144)
Total Protein: 6.5 g/dL (ref 6.0–8.5)

## 2018-01-23 ENCOUNTER — Encounter: Payer: Self-pay | Admitting: Family Medicine

## 2018-01-24 ENCOUNTER — Encounter: Payer: Self-pay | Admitting: Family Medicine

## 2018-01-24 ENCOUNTER — Ambulatory Visit (HOSPITAL_COMMUNITY)
Admission: RE | Admit: 2018-01-24 | Discharge: 2018-01-24 | Disposition: A | Discharge status: Home / Self Care | Payer: 59 | Source: Ambulatory Visit | Attending: Family Medicine | Admitting: Family Medicine

## 2018-01-24 DIAGNOSIS — K760 Fatty (change of) liver, not elsewhere classified: Secondary | ICD-10-CM | POA: Diagnosis not present

## 2018-01-24 DIAGNOSIS — R1011 Right upper quadrant pain: Secondary | ICD-10-CM | POA: Insufficient documentation

## 2018-01-24 DIAGNOSIS — I700 Atherosclerosis of aorta: Secondary | ICD-10-CM | POA: Insufficient documentation

## 2018-02-06 ENCOUNTER — Other Ambulatory Visit (INDEPENDENT_AMBULATORY_CARE_PROVIDER_SITE_OTHER): Payer: Self-pay | Admitting: Internal Medicine

## 2018-02-10 ENCOUNTER — Encounter (INDEPENDENT_AMBULATORY_CARE_PROVIDER_SITE_OTHER): Payer: Self-pay | Admitting: *Deleted

## 2018-02-10 ENCOUNTER — Ambulatory Visit (INDEPENDENT_AMBULATORY_CARE_PROVIDER_SITE_OTHER): Payer: 59 | Admitting: Internal Medicine

## 2018-02-10 ENCOUNTER — Encounter (INDEPENDENT_AMBULATORY_CARE_PROVIDER_SITE_OTHER): Payer: Self-pay | Admitting: Internal Medicine

## 2018-02-10 VITALS — BP 150/82 | HR 52 | Temp 98.5°F | Ht 63.0 in | Wt 209.9 lb

## 2018-02-10 DIAGNOSIS — R1010 Upper abdominal pain, unspecified: Secondary | ICD-10-CM | POA: Diagnosis not present

## 2018-02-10 DIAGNOSIS — R1013 Epigastric pain: Secondary | ICD-10-CM | POA: Diagnosis not present

## 2018-02-10 NOTE — Patient Instructions (Signed)
US Abdomen.

## 2018-02-10 NOTE — Progress Notes (Signed)
Past Medical History:  Diagnosis Date  . Anemia   . Asthma    seasonal asthma rarely   . Chronic kidney disease    renal mass right kidney   . CPAP (continuous positive airway pressure) dependence   . Diverticulitis   . GERD (gastroesophageal reflux disease)   . Hemorrhoids   . Hyperlipemia   . Hypertension   . Migraine   . 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 PONV (postoperative nausea and vomiting)    patient reports being "slower to wake than average "   . Pre-diabetes    "ive been told i'm borderline"   . Sleep apnea    CPAP use     Past Surgical History:  Procedure Laterality Date  . ABDOMINAL HYSTERECTOMY    . APPENDECTOMY  1978  . BIOPSY  07/08/2017   Procedure: BIOPSY;  Surgeon: Rogene Houston, MD;  [facility];  Service: Endoscopy;;  gastric  . CHOLECYSTECTOMY  2005  . COLONOSCOPY WITH PROPOFOL N/A 10/28/2017   Procedure: COLONOSCOPY WITH PROPOFOL;  Surgeon: Rogene Houston, MD;  [facility];  Service: Endoscopy;  Laterality: N/A;  7:30  . ESOPHAGOGASTRODUODENOSCOPY N/A 07/08/2017   Procedure: ESOPHAGOGASTRODUODENOSCOPY (EGD);  Surgeon: Rogene Houston, MD;  [facility];  Service: Endoscopy;  Laterality: N/A;  220  . NASAL SEPTUM SURGERY    . ROBOTIC ASSITED PARTIAL NEPHRECTOMY Right 05/17/2017   Procedure: XI ROBOTIC ASSITED PARTIAL NEPHRECTOMY;  Surgeon: Alexis Frock, MD;  Location: [facility];  Service: Urology;  Laterality: Right;    Allergies  Allergen Reactions  . Iodinated Diagnostic Agents Hives and Itching  . Isovue [Iopamidol] Itching    And hives   . Nitrofurantoin Hives  . Other Hives, Itching, Other (See Comments) and Rash    Paper tape ONLY  . Sulfa Antibiotics Hives  . Adhesive [Tape] Rash and Other (See Comments)    Paper tape ONLY  . Mango Flavor Swelling and Rash    Tongue swelling, feels like throat is closing in on her     Current Outpatient Medications  on File Prior to Visit  Medication Sig Dispense Refill  . aspirin EC 81 MG tablet Take 1 tablet (81 mg total) by mouth daily.    . DULoxetine (CYMBALTA) 20 MG capsule Take 20 mg by mouth 2 (two) times daily.    . furosemide (LASIX) 20 MG tablet DAILEY AS NEEDED FOR SWELLING 90 tablet 1  . gabapentin (NEURONTIN) 100 MG capsule Take 300 mg by mouth 3 (three) times daily.   3  . lidocaine (LIDODERM) 5 % Place 1 patch onto the skin daily. Remove & Discard patch within 12 hours or as directed by MD (Patient taking differently: Place 1 patch onto the skin daily as needed (pain). Remove & Discard patch within 12 hours or as directed by MD) 30 patch 1  . loratadine (CLARITIN) 10 MG tablet Take 10 mg by mouth daily as needed for allergies.    Marland Kitchen losartan (COZAAR) 100 MG tablet Take 1 tablet (100 mg total) by mouth daily. 90 tablet 1  . ondansetron (ZOFRAN ODT) 4 MG disintegrating tablet Take 1 tablet (4 mg total) every 8 (eight) hours as needed by mouth for nausea or vomiting. 4mg  ODT q4 hours prn nausea/vomit 30 tablet 0  . pantoprazole (PROTONIX) 40 MG tablet TAKE (1) TABLET TWICE A DAY BEFORE MEALS. 60 tablet 4  . potassium chloride SA (K-DUR,KLOR-CON)  20 MEQ tablet Take 20 mEq by mouth daily.     . rosuvastatin (CRESTOR) 5 MG tablet Take 5 mg by mouth daily.      Current Facility-Administered Medications on File Prior to Visit  Medication Dose Route Frequency Provider Last Rate Last Dose  . ondansetron (ZOFRAN-ODT) disintegrating tablet 4 mg  4 mg Oral Once Ronnie Doss M, DO

## 2018-02-10 NOTE — Progress Notes (Signed)
Subjective:    Patient ID: Rebecca Scott, female    DOB: 16-Apr-1956, 62 y.o.   MRN: [id number]  HPI Presents today with c/o epigastric pain.  She says she has pain in her epigastric region. She says she has had tenderness x 3 weeks. She says it was just occasionally and how it is every day. EGD in October was normal. Occurs after she eats. Sometimes if she drinks water she may have pain.  Appetite is not good. She has lost 6 pounds over the past couple of weeks.  She says she has horrible gas.  She says she does not have GERD. If she eat turnip greens, her stools ar thin and soft which is normal for her. Sometimes she may have 3 stools and day and then she may skip  2-3 days.  [place] RUQ 01/24/2018 which revealed no acute findings. No evidence of CBS stone.  Hepatic steatosis.   Underwent a colonoscopy 10/28/2017 (change in stool). Normal. Diverticulosis in sigmoid colon. Small external hemorrhoids.  EGD 07/08/2017: Normal. Gastritis.   Review of Systems Past Medical History:  Diagnosis Date  . Anemia   . Asthma    seasonal asthma rarely   . Chronic kidney disease    renal mass right kidney   . CPAP (continuous positive airway pressure) dependence   . Diverticulitis   . GERD (gastroesophageal reflux disease)   . Hemorrhoids   . Hyperlipemia   . Hypertension   . Migraine   . 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 PONV (postoperative nausea and vomiting)    patient reports being "slower to wake than average "   . Pre-diabetes    "ive been told i'm borderline"   . Sleep apnea    CPAP use     Past Surgical History:  Procedure Laterality Date  . ABDOMINAL HYSTERECTOMY    . APPENDECTOMY  1978  . BIOPSY  07/08/2017   Procedure: BIOPSY;  Surgeon: Rogene Houston, MD;  [facility];  Service: Endoscopy;;  gastric  . CHOLECYSTECTOMY  2005  . COLONOSCOPY WITH PROPOFOL N/A 10/28/2017   Procedure: COLONOSCOPY WITH PROPOFOL;   Surgeon: Rogene Houston, MD;  [facility];  Service: Endoscopy;  Laterality: N/A;  7:30  . ESOPHAGOGASTRODUODENOSCOPY N/A 07/08/2017   Procedure: ESOPHAGOGASTRODUODENOSCOPY (EGD);  Surgeon: Rogene Houston, MD;  [facility];  Service: Endoscopy;  Laterality: N/A;  220  . NASAL SEPTUM SURGERY    . ROBOTIC ASSITED PARTIAL NEPHRECTOMY Right 05/17/2017   Procedure: XI ROBOTIC ASSITED PARTIAL NEPHRECTOMY;  Surgeon: Alexis Frock, MD;  [facility];  Service: Urology;  Laterality: Right;    Allergies  Allergen Reactions  . Iodinated Diagnostic Agents Hives and Itching  . Isovue [Iopamidol] Itching    And hives   . Nitrofurantoin Hives  . Other Hives, Itching, Other (See Comments) and Rash    Paper tape ONLY  . Sulfa Antibiotics Hives  . Adhesive [Tape] Rash and Other (See Comments)    Paper tape ONLY  . Mango Flavor Swelling and Rash    Tongue swelling, feels like throat is closing in on her     Current Outpatient Medications on File Prior to Visit  Medication Sig Dispense Refill  . baclofen (LIORESAL) 10 MG tablet Take 10 mg by mouth at bedtime.    . DULoxetine (CYMBALTA) 20 MG capsule Take 20 mg by mouth 2 (two) times daily.    Marland Kitchen  lidocaine (LIDODERM) 5 % Place 1 patch onto the skin daily. Remove & Discard patch within 12 hours or as directed by MD (Patient taking differently: Place 1 patch onto the skin daily as needed (pain). Remove & Discard patch within 12 hours or as directed by MD) 30 patch 1  . losartan (COZAAR) 100 MG tablet Take 1 tablet (100 mg total) by mouth daily. 90 tablet 1  . ondansetron (ZOFRAN ODT) 4 MG disintegrating tablet Take 1 tablet (4 mg total) every 8 (eight) hours as needed by mouth for nausea or vomiting. 4mg  ODT q4 hours prn nausea/vomit 30 tablet 0  . potassium chloride SA (K-DUR,KLOR-CON) 20 MEQ tablet Take 20 mEq by mouth daily.     . rosuvastatin (CRESTOR) 5 MG tablet Take 5 mg by mouth daily.      Current  Facility-Administered Medications on File Prior to Visit  Medication Dose Route Frequency Provider Last Rate Last Dose  . ondansetron (ZOFRAN-ODT) disintegrating tablet 4 mg  4 mg Oral Once Gottschalk, Ashly M, DO            Objective:   Physical Exam Blood pressure (!) 150/82, pulse (!) 52, temperature 98.5 F (36.9 C), height 5\' 3"  (1.6 m), weight 209 lb 14.4 oz (95.2 kg). Alert and oriented. Skin warm and dry. Oral mucosa is moist.   . Sclera anicteric, conjunctivae is pink. Thyroid not enlarged. No cervical lymphadenopathy. Lungs clear. Heart regular rate and rhythm.  Abdomen is soft. Bowel sounds are positive. No hepatomegaly. No abdominal masses felt. No tenderness.  No edema to lower extremities.           Assessment & Plan:  Epigastric pain. US abdomen. Continue the Protonix

## 2018-02-14 ENCOUNTER — Ambulatory Visit (HOSPITAL_COMMUNITY)
Admission: RE | Admit: 2018-02-14 | Discharge: 2018-02-14 | Disposition: A | Discharge status: Home / Self Care | Payer: 59 | Source: Ambulatory Visit | Attending: Internal Medicine | Admitting: Internal Medicine

## 2018-02-14 DIAGNOSIS — R1013 Epigastric pain: Secondary | ICD-10-CM | POA: Diagnosis present

## 2018-02-14 DIAGNOSIS — N281 Cyst of kidney, acquired: Secondary | ICD-10-CM | POA: Insufficient documentation

## 2018-02-14 DIAGNOSIS — I700 Atherosclerosis of aorta: Secondary | ICD-10-CM | POA: Insufficient documentation

## 2018-02-14 DIAGNOSIS — Z9049 Acquired absence of other specified parts of digestive tract: Secondary | ICD-10-CM | POA: Diagnosis not present

## 2018-02-17 ENCOUNTER — Telehealth: Payer: Self-pay | Admitting: *Deleted

## 2018-02-17 NOTE — Telephone Encounter (Signed)
Pt aware and says BP for last few days has been trending up 184/94 179/98 179/89 163/92 149/89 144/89 156/82 191/96 - has taken BP multiple times during the day - currently has kidney stones and is in pain and thinks this may be affecting BP. Advised pt to take BP at the same time only once daily after taking BP medication and after sitting 5-10 mins and update [place] on Friday with readings - will forward to provider for any further suggestions.

## 2018-02-17 NOTE — Telephone Encounter (Signed)
-----   Message from Arnoldo Lenis, MD sent at 02/11/2018  4:29 PM EDT ----- BP log reviewed and looks good   Zandra Abts MD

## 2018-02-17 NOTE — Telephone Encounter (Signed)
I agree with your plan   Zandra Abts MD

## 2018-02-18 ENCOUNTER — Encounter: Payer: Self-pay | Admitting: "Endocrinology

## 2018-02-18 ENCOUNTER — Ambulatory Visit (INDEPENDENT_AMBULATORY_CARE_PROVIDER_SITE_OTHER): Payer: 59 | Admitting: "Endocrinology

## 2018-02-18 VITALS — BP 155/82 | HR 60 | Ht 63.0 in | Wt 209.0 lb

## 2018-02-18 DIAGNOSIS — E042 Nontoxic multinodular goiter: Secondary | ICD-10-CM | POA: Diagnosis not present

## 2018-02-18 DIAGNOSIS — I10 Essential (primary) hypertension: Secondary | ICD-10-CM

## 2018-02-18 MED ORDER — HYDROCHLOROTHIAZIDE 25 MG PO TABS: 25.0000 mg | ORAL_TABLET | Freq: Every day | ORAL | 2 refills | Status: DC

## 2018-02-18 NOTE — Progress Notes (Signed)
Endocrinology Consult Note                                            02/18/2018, 1:47 PM   Subjective:    Patient ID: Rebecca Scott, female    DOB: 05-08-1956, PCP Janora Norlander, DO   Past Medical History:  Diagnosis Date  . Anemia   . Asthma    seasonal asthma rarely   . Chronic kidney disease    renal mass right kidney   . CPAP (continuous positive airway pressure) dependence   . Diverticulitis   . GERD (gastroesophageal reflux disease)   . Hemorrhoids   . Hyperlipemia   . Hypertension   . Migraine   . 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 PONV (postoperative nausea and vomiting)    patient reports being "slower to wake than average "   . Pre-diabetes    "ive been told i'm borderline"   . Sleep apnea    CPAP use    Past Surgical History:  Procedure Laterality Date  . ABDOMINAL HYSTERECTOMY    . APPENDECTOMY  1978  . BIOPSY  07/08/2017   Procedure: BIOPSY;  Surgeon: Rogene Houston, MD;  Location: [facility];  Service: Endoscopy;;  gastric  . CHOLECYSTECTOMY  2005  . COLONOSCOPY WITH PROPOFOL N/A 10/28/2017   Procedure: COLONOSCOPY WITH PROPOFOL;  Surgeon: Rogene Houston, MD;  Location: [facility];  Service: Endoscopy;  Laterality: N/A;  7:30  . ESOPHAGOGASTRODUODENOSCOPY N/A 07/08/2017   Procedure: ESOPHAGOGASTRODUODENOSCOPY (EGD);  Surgeon: Rogene Houston, MD;  Location: [facility];  Service: Endoscopy;  Laterality: N/A;  220  . NASAL SEPTUM SURGERY    . ROBOTIC ASSITED PARTIAL NEPHRECTOMY Right 05/17/2017   Procedure: XI ROBOTIC ASSITED PARTIAL NEPHRECTOMY;  Surgeon: Alexis Frock, MD;  Location: [facility];  Service: Urology;  Laterality: Right;   Social History   Socioeconomic History  . Marital status: Widowed    Spouse name: Not on file  . Number of children: Not on file  . Years of education: Not on file  . Highest education level: Not on file  Occupational History  . Not on file   Social Needs  . Financial resource strain: Not on file  . Food insecurity:    Worry: Not on file    Inability: Not on file  . Transportation needs:    Medical: Not on file    Non-medical: Not on file  Tobacco Use  . Smoking status: Never Smoker  . Smokeless tobacco: Never Used  Substance and Sexual Activity  . Alcohol use: Yes    Comment: rarely   . Drug use: No  . Sexual activity: Not Currently  Lifestyle  . Physical activity:    Days per week: Not on file    Minutes per session: Not on file  . Stress: Not on file  Relationships  . Social connections:    Talks on phone: Not on file    Gets together: Not on file    Attends religious service: Not on file    Active member of club or organization: Not on file    Attends meetings of clubs or organizations: Not on file    Relationship status: Not on file  Other Topics Concern  . Not on file  Social  History Narrative  . Not on file   Outpatient Encounter Medications as of 02/18/2018  Medication Sig  . baclofen (LIORESAL) 10 MG tablet Take 10 mg by mouth at bedtime.  . DULoxetine (CYMBALTA) 20 MG capsule Take 20 mg by mouth 2 (two) times daily.  . hydrochlorothiazide (HYDRODIURIL) 25 MG tablet Take 1 tablet (25 mg total) by mouth daily.  Marland Kitchen lidocaine (LIDODERM) 5 % Place 1 patch onto the skin daily. Remove & Discard patch within 12 hours or as directed by MD (Patient taking differently: Place 1 patch onto the skin daily as needed (pain). Remove & Discard patch within 12 hours or as directed by MD)  . losartan (COZAAR) 100 MG tablet Take 1 tablet (100 mg total) by mouth daily.  . ondansetron (ZOFRAN ODT) 4 MG disintegrating tablet Take 1 tablet (4 mg total) every 8 (eight) hours as needed by mouth for nausea or vomiting. 4mg  ODT q4 hours prn nausea/vomit  . pantoprazole (PROTONIX) 20 MG tablet Take 20 mg by mouth 2 (two) times daily.  . potassium chloride SA (K-DUR,KLOR-CON) 20 MEQ tablet Take 20 mEq by mouth daily.   .  rosuvastatin (CRESTOR) 5 MG tablet Take 5 mg by mouth daily.    Facility-Administered Encounter Medications as of 02/18/2018  Medication  . ondansetron (ZOFRAN-ODT) disintegrating tablet 4 mg   ALLERGIES: Allergies  Allergen Reactions  . Iodinated Diagnostic Agents Hives and Itching  . Isovue [Iopamidol] Itching    And hives   . Nitrofurantoin Hives  . Other Hives, Itching, Other (See Comments) and Rash    Paper tape ONLY  . Sulfa Antibiotics Hives  . Adhesive [Tape] Rash and Other (See Comments)    Paper tape ONLY  . Mango Flavor Swelling and Rash    Tongue swelling, feels like throat is closing in on her     VACCINATION STATUS: Immunization History  Administered Date(s) Administered  . Influenza-Unspecified 06/24/2017    HPI Rebecca Scott is 61 y.o. female who presents today with a medical history as above including renal cell carcinoma for which she underwent partial nephrectomy in 2018. she is being seen in consultation for multinodular goiter status post fine-needle aspiration requested by Janora Norlander, DO.  -She was found to have multinodular goiter in November 2018.  At that time she was found to have 2.4 cm nodule in the 5 cm right lobe of thyroid and 1.5 cm nodule in the 4.3 cm left lobe.  Repeat ultrasound in April 2019 showed thyroid remaining more or less the same size however the nodules were observed to grow to 2.7 cm on the right lobe and 1.7 cm in the left lobe.  Subsequently, earlier this month she underwent fine-needle aspiration of the right lobe nodule which showed follicular lesion of undetermined significance.  Molecular studies were not sent. -Patient complains of foreign body sensation and occasional choking in her neck while eating and breathing problem when she is laying on her back.   -She denies any exposure to neck radiation.  She denies family history of thyroid cancer, however multiple family members with what appears to be hypothyroidism.  She  denies heat/cold intolerance.  She does not have recent thyroid function test, however in December 2018 her thyroid function tests were normal.   Review of Systems  Constitutional: + weight loss, no fatigue, no subjective hyperthermia, no subjective hypothermia Eyes: no blurry vision, no xerophthalmia ENT: no sore throat, + nodules palpated in throat, + dysphagia , + odynophagia, no  hoarseness Cardiovascular: no Chest Pain, no Shortness of Breath, no palpitations, no leg swelling Respiratory: no cough, + positional SOB Gastrointestinal: no Nausea/Vomiting/Diarhhea Musculoskeletal: no muscle/joint aches Skin: no rashes Neurological: no tremors, no numbness, no tingling, no dizziness Psychiatric: no depression, no anxiety  Objective:    BP (!) 155/82   Pulse 60   Ht 5\' 3"  (1.6 m)   Wt 209 lb (94.8 kg)   BMI 37.02 kg/m   Wt Readings from Last 3 Encounters:  02/18/18 209 lb (94.8 kg)  02/10/18 209 lb 14.4 oz (95.2 kg)  01/20/18 216 lb (98 kg)    Physical Exam  Constitutional: + Obese, not in acute distress, normal state of mind Eyes: PERRLA, EOMI, no exophthalmos ENT: moist mucous membranes, + full neck, + palpable nodular thyromegaly, no cervical lymphadenopathy Cardiovascular: normal precordial activity, Regular Rate and Rhythm, no Murmur/Rubs/Gallops Respiratory:  adequate breathing efforts, no gross chest deformity, Clear to auscultation bilaterally Gastrointestinal: abdomen soft, Non -tender, No distension, Bowel Sounds present Musculoskeletal: no gross deformities, strength intact in all four extremities Skin: moist, warm, no rashes Neurological: no tremor with outstretched hands, Deep tendon reflexes normal in all four extremities.   CMP     Component Value Date/Time   NA 141 01/20/2018 1139   K 4.0 01/20/2018 1139   CL 106 01/20/2018 1139   CO2 20 01/20/2018 1139   GLUCOSE 135 (H) 01/20/2018 1139   GLUCOSE 101 (H) 08/11/2017 1320   BUN 12 01/20/2018 1139    CREATININE 0.69 01/20/2018 1139   CALCIUM 9.0 01/20/2018 1139   PROT 6.5 01/20/2018 1139   ALBUMIN 3.7 01/20/2018 1139   AST 19 01/20/2018 1139   ALT 29 01/20/2018 1139   ALKPHOS 109 01/20/2018 1139   BILITOT 0.3 01/20/2018 1139   GFRNONAA 95 01/20/2018 1139   GFRAA 109 01/20/2018 1139     Diabetic Labs (most recent): Lab Results  Component Value Date   HGBA1C 5.7 (H) 05/09/2017      Lab Results  Component Value Date   TSH 1.464 08/11/2017   FREET4 0.68 08/11/2017      Assessment & Plan:   1. Multinodular goiter  - Rebecca Scott  is being seen at a kind request of Ronnie Doss M, DO. - I have reviewed her available thyroid work-up records and clinically evaluated the patient. - Based on reviews, she has multinodular goiter with indeterminate biopsy results and clinically significant neck compressive symptoms. -He did not have molecular studies during her previous biopsy. -I discussed options with her including continued observation with series of imaging studies to monitor nodular growth and direct thyroidectomy with understanding of subsequent need for thyroid hormone replacement for life. -She is the patient with known malignancy, worries about any possibility of other malignancies and understandably favors the latter option.  There is 5 to 15% risk of malignancy and biopsy results reported as follicular lesion of undetermined significance (FLUS).  -I will send her to Dr. Aviva Signs for total thyroidectomy with plan to study her surgical sample for detailed pathology.   -If her surgical sample reveals occult malignancy, she will be considered for adjuvant therapies.  Regarding her hypertension which is not controlled to target, I discussed and added hydrochlorothiazide 25 mg p.o. daily along with her losartan 100 mg p.o. daily.  - I advised her  to maintain close follow up with Janora Norlander, DO for primary care needs.  - Time spent with the patient: 45  minutes, of which >50% was  spent in obtaining information about her symptoms, reviewing her previous labs, evaluations, and treatments, counseling her about her thyroid imaging and biopsy results, lab calls, and developing a plan for long term treatment . Rebecca Scott participated in the discussions, expressed understanding, and voiced agreement with the above plans.  All questions were answered to her satisfaction. she is encouraged to contact clinic should she have any questions or concerns prior to her return visit.  Follow up plan: Return in about 3 months (around 05/21/2018) for follow up with labs after surgery.   Glade Lloyd, MD [facility] [address] [place], [place] [address] Phone: [phone]  Fax: [phone]     02/18/2018, 1:47 PM  This note was partially dictated with voice recognition software. Similar sounding words can be transcribed inadequately or may not  be corrected upon review.

## 2018-03-04 ENCOUNTER — Ambulatory Visit (INDEPENDENT_AMBULATORY_CARE_PROVIDER_SITE_OTHER): Payer: 59 | Admitting: General Surgery

## 2018-03-04 ENCOUNTER — Encounter: Payer: Self-pay | Admitting: General Surgery

## 2018-03-04 VITALS — BP 167/88 | HR 66 | Temp 98.7°F | Resp 18 | Wt 207.0 lb

## 2018-03-04 DIAGNOSIS — E049 Nontoxic goiter, unspecified: Secondary | ICD-10-CM

## 2018-03-04 NOTE — Progress Notes (Signed)
Pt is a 62 y.o. female who is here for preoperative clearance for total thyroidectomy on 03/10/2018 for multi-nodule goiter.  Denies any chest pain, shortness of breath.  She does feel like she gets choked often and that her thyroid interferes with her ability to take a deep breath and sometimes.  She otherwise does well and tolerates exercise without difficulty.  1) High Risk Cardiac Conditions  1) Recent MI - No.  2) Decompensated Heart Failure - No.  3) Unstable angina - No.  4) Symptomatic arrythmia - No.  5) Sx Valvular Disease - No.  2) Intermediate Risk Factors - DM, CKD, CVA, CHF, CAD - No.  2) Functional Status - > 4 mets (Walk, run, climb stairs) Yes.  Rob Hickman Activity Status Index: 58.2  3) Surgery Specific Risk -  Intermediate (Head and Neck)         4) Further Noninvasive evaluation -   1) EKG - No.   1) Hx of CVA, CAD, DM, CKD  2) Echo - No.   1) Worsening dyspnea   3) Stress Testing - Active Cardiac Disease - No.  5) Need for medical therapy - Beta Blocker, Statins indicated ? No.  PE: Vitals:   03/05/18 0857  BP: 114/72  Pulse: 69  Temp: 98 F (36.7 C)   Physical Examination: General appearance - alert, well appearing, and in no distress Mental status - alert, oriented to person, place, and time Eyes - pupils equal and reactive, extraocular eye movements intact, sclera anicteric Mouth - mucous membranes moist, pharynx normal without lesions and Mallampati 1 Neck - multinodular thyroid Lymphatics - no palpable lymphadenopathy Chest - clear to auscultation, no wheezes, rales or rhonchi, symmetric air entry Heart - normal rate, regular rhythm, normal S1, S2, no murmurs, rubs, clicks or gallops Extremities - peripheral pulses normal, no pedal edema, no clubbing or cyanosis   1. Pre-op examination I have independently evaluated patient.  Rebecca Scott is a 62 y.o. female who is low risk for an intermediate risk surgery.  There are/ are not modifiable risk  factors.  Rebecca Scott RCRI/NSQIP calculation for MACE is: 0.    2. Essential hypertension, benign BP at goal on current regimen.  We will continue to monitor closely.  3. Mixed hyperlipidemia Handout provided for diet modification for hyperlipidemia.  She was noted to have fatty liver on recent ultrasound with gastroenterology.  We discussed consideration for referral to dietitian for this.  She wants to hold off on this for now.    4. Fatty liver Lifestyle modification recommended.  5. Obstructive sleep apnea Uses CPAP.     Rebecca Scott, [facility]

## 2018-03-04 NOTE — Patient Instructions (Signed)
Thyroidectomy A thyroidectomy is a surgery that is done to remove all (total thyroidectomy) or part (subtotal thyroidectomy) of your thyroid gland. The thyroid is a butterfly-shaped gland that is located at the lower front of your neck. It produces a substance that helps to control certain body processes (thyroid hormone). The amount of thyroid gland tissue that is removed during your thyroidectomy depends on the reason you need the procedure. You may have a thyroidectomy to treat conditions including:  Thyroid nodules.  Thyroid cancer.  Benign thyroid tumors.  Goiter.  Overactive thyroid gland (hyperthyroidism).  There are different ways to do a thyroidectomy:  Conventional thyroidectomy (open thyroidectomy). This procedure is the most common. In this procedure, the thyroid gland is removed through one surgical cut (incision) in the neck.  Endoscopic thyroidectomy. This procedure is less invasive. In this procedure, there may be several smaller incisions in the neck, chest, or armpit. The surgeon uses a tiny camera and other assistive tools to remove the thyroid gland.  Tell a health care provider about:  Any allergies you have.  All medicines you are taking, including vitamins, herbs, eye drops, creams, and over-the-counter medicines.  Any problems you or family members have had with anesthetic medicines.  Any blood disorders you have.  Any surgeries you have had.  Any medical conditions you have. What are the risks? Generally, this is a safe procedure. However, problems can occur and include:  A decrease in parathyroid hormone levels (hypoparathyroidism). Your parathyroid glands are located behind your thyroid gland, and they maintain the calcium level in your body. If these glands are damaged during surgery, your calcium level will drop. This will make your nerves irritable and cause muscle spasms.  An increase in thyroid hormone.  Damage to the nerves of your voice box  (larynx).  Bleeding.  Infection at the site of the incision or incisions.  Temporary breathing difficulties. This is a very rare complication. It usually goes away within weeks.  What happens before the procedure?  Your health care provider will perform a physical exam and assess your voice for vocal changes.  Ask your health care provider about: ? Changing or stopping your regular medicines. This is especially important if you are taking diabetes medicines or blood thinners. ? Taking medicines such as aspirin and ibuprofen. These medicines can thin your blood. Do not take these medicines before your procedure if your health care provider instructs you not to.  Follow instructions from your health care provider about eating or drinking restrictions. What happens during the procedure? You will be given a medicine that makes you go to sleep (general anesthetic). Depending on which type of thyroidectomy you have, this is what may happen during the procedure: Conventional Thyroidectomy  The surgeon will make an incision in the center of your lower neck.  The muscles in your neck will be separated to reveal your thyroid gland.  Part or all of your thyroid gland will be removed.  You may need a tube (catheter) at the incision site to drain blood and fluids that accumulate under the skin after the procedure.The catheter may have to stay in place for a day or two after the procedure.  The incision will be closed with stitches (sutures). Endoscopic Thyroidectomy  The surgeon will make several small incisions in your neck, chest, or armpit.  The surgeon will use a narrow tube with a light and camera at the end (endoscope). The surgeon will insert the endoscope into an incision.  Part or   all of your thyroid gland will be removed.  You may need a catheter at the incision site to drain blood and fluids that accumulate under the skin after the procedure.The catheter may have to stay in  place for a day or two after the procedure.  The incision will be closed with sutures. What happens after the procedure?  Your blood pressure, heart rate, breathing rate, and blood oxygen level will be monitored often until the medicines you were given have worn off.  Depending on the type of thyroidectomy you had, you may have: ? A swollen neck. ? Some mild neck pain. ? A slightly sore throat. ? A weak voice.  You will not be able to eat or drink until your health care provider says it is okay.  You may have a blood test to check the level of calcium in your body.  If you had a catheter put in during the procedure, it will usually be removed the next day. This information is not intended to replace advice given to you by your health care provider. Make sure you discuss any questions you have with your health care provider. Document Released: 02/20/2001 Document Revised: 04/29/2016 Document Reviewed: 01/27/2014 Elsevier Interactive Patient Education  [employer].

## 2018-03-04 NOTE — Progress Notes (Signed)
Rebecca Scott; [id number]; Dec 20, 1955   HPI Patient is a 62 year old white female who was referred to my care by Dr. Dorris Fetch of endocrinology for evaluation and treatment of a multinodular goiter.  Patient has had the goiter for some time now, but recently has noticed pressure on her trachea and some dysphasia.  She is recently had a fine-needle aspiration of a nodule in the thyroid gland which was indeterminate.  She denies any history of heart palpitations, heat intolerance, neck irradiation, or family history of thyroid cancer.  She currently has no neck pain. Past Medical History:  Diagnosis Date  . Anemia   . Asthma    seasonal asthma rarely   . Chronic kidney disease    renal mass right kidney   . CPAP (continuous positive airway pressure) dependence   . Diverticulitis   . GERD (gastroesophageal reflux disease)   . Hemorrhoids   . Hyperlipemia   . Hypertension   . Migraine   . 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 PONV (postoperative nausea and vomiting)    patient reports being "slower to wake than average "   . Pre-diabetes    "ive been told i'm borderline"   . Sleep apnea    CPAP use     Past Surgical History:  Procedure Laterality Date  . ABDOMINAL HYSTERECTOMY    . APPENDECTOMY  1978  . BIOPSY  07/08/2017   Procedure: BIOPSY;  Surgeon: Rogene Houston, MD;  [facility];  Service: Endoscopy;;  gastric  . CHOLECYSTECTOMY  2005  . COLONOSCOPY WITH PROPOFOL N/A 10/28/2017   Procedure: COLONOSCOPY WITH PROPOFOL;  Surgeon: Rogene Houston, MD;  [facility];  Service: Endoscopy;  Laterality: N/A;  7:30  . ESOPHAGOGASTRODUODENOSCOPY N/A 07/08/2017   Procedure: ESOPHAGOGASTRODUODENOSCOPY (EGD);  Surgeon: Rogene Houston, MD;  [facility];  Service: Endoscopy;  Laterality: N/A;  220  . NASAL SEPTUM SURGERY    . ROBOTIC ASSITED PARTIAL NEPHRECTOMY Right 05/17/2017   Procedure: XI ROBOTIC ASSITED  PARTIAL NEPHRECTOMY;  Surgeon: Alexis Frock, MD;  Location: [facility];  Service: Urology;  Laterality: Right;    Family History  Problem Relation Age of Onset  . COPD Mother   . Diabetes Mother   . Hypertension Mother   . High Cholesterol Mother   . Liver cancer Mother   . COPD Sister   . Hypertension Brother   . High Cholesterol Brother   . Hypertension Brother   . Asthma Son     Current Outpatient Medications on File Prior to Visit  Medication Sig Dispense Refill  . baclofen (LIORESAL) 10 MG tablet Take 10 mg by mouth at bedtime.    . DULoxetine (CYMBALTA) 20 MG capsule Take 20 mg by mouth 2 (two) times daily.    . hydrochlorothiazide (HYDRODIURIL) 25 MG tablet Take 1 tablet (25 mg total) by mouth daily. 30 tablet 2  . lidocaine (LIDODERM) 5 % Place 1 patch onto the skin daily. Remove & Discard patch within 12 hours or as directed by MD (Patient taking differently: Place 1 patch onto the skin daily as needed (pain). Remove & Discard patch within 12 hours or as directed by MD) 30 patch 1  . losartan (COZAAR) 100 MG tablet Take 1 tablet (100 mg total) by mouth daily. 90 tablet 1  . ondansetron (ZOFRAN ODT) 4 MG disintegrating tablet Take 1 tablet (4 mg total) every 8 (eight) hours as needed by  mouth for nausea or vomiting. 4mg  ODT q4 hours prn nausea/vomit 30 tablet 0  . pantoprazole (PROTONIX) 20 MG tablet Take 20 mg by mouth 2 (two) times daily.    . potassium chloride SA (K-DUR,KLOR-CON) 20 MEQ tablet Take 20 mEq by mouth daily.     . rosuvastatin (CRESTOR) 5 MG tablet Take 5 mg by mouth daily.      Current Facility-Administered Medications on File Prior to Visit  Medication Dose Route Frequency Provider Last Rate Last Dose  . ondansetron (ZOFRAN-ODT) disintegrating tablet 4 mg  4 mg Oral Once Gottschalk, Ashly M, DO        Allergies  Allergen Reactions  . Iodinated Diagnostic Agents Hives and Itching  . Isovue [Iopamidol] Itching    And hives   . Nitrofurantoin Hives  .  Other Hives, Itching, Other (See Comments) and Rash    Paper tape ONLY  . Sulfa Antibiotics Hives  . Adhesive [Tape] Rash and Other (See Comments)    Paper tape ONLY  . Mango Flavor Swelling and Rash    Tongue swelling, feels like throat is closing in on her     Social History   Substance and Sexual Activity  Alcohol Use Yes   Comment: rarely     Social History   Tobacco Use  Smoking Status Never Smoker  Smokeless Tobacco Never Used    Review of Systems  Constitutional: Positive for malaise/fatigue.  HENT: Positive for sinus pain.   Eyes: Positive for blurred vision.  Respiratory: Negative.   Cardiovascular: Negative.   Gastrointestinal: Positive for abdominal pain, heartburn and nausea.  Genitourinary: Negative.   Musculoskeletal: Positive for back pain, joint pain and neck pain.  Skin: Negative.   Neurological: Positive for headaches.  Endo/Heme/Allergies: Negative.   Psychiatric/Behavioral: Negative.     Objective   Vitals:   03/04/18 1030  BP: (!) 167/88  Pulse: 66  Resp: 18  Temp: 98.7 F (37.1 C)    Physical Exam  Constitutional: She is oriented to person, place, and time. She appears well-developed and well-nourished.  HENT:  Head: Normocephalic and atraumatic.  Neck: Normal range of motion. Neck supple. No JVD present. No tracheal deviation present. Thyromegaly present.  Diffusely enlarged thyroid gland with nodularity noted.  No tracheal deviation noted.  Cardiovascular: Normal rate, regular rhythm and normal heart sounds. Exam reveals no gallop and no friction rub.  No murmur heard. Pulmonary/Chest: Effort normal and breath sounds normal. No stridor. No respiratory distress. She has no wheezes. She has no rales.  Lymphadenopathy:    She has no cervical adenopathy.  Neurological: She is alert and oriented to person, place, and time.  Skin: Skin is warm and dry.  Vitals reviewed.  Dr. Liliane Channel note reviewed.  Ultrasound report  reviewed. Assessment  Nodular goiter, indeterminate fine-needle aspiration of thyroid nodule Plan   Patient is scheduled for a total thyroidectomy on 03/10/2018.  The risks and benefits of the procedure including bleeding, infection, nerve injury, low calcium levels, and the possibility of malignancy were fully explained to the patient, who gave informed consent.

## 2018-03-04 NOTE — H&P (Signed)
Rebecca Scott; [id number]; [birth year]   HPI Patient is a 62 year old white female who was referred to my care by Dr. Dorris Fetch of endocrinology for evaluation and treatment of a multinodular goiter.  Patient has had the goiter for some time now, but recently has noticed pressure on her trachea and some dysphasia.  She is recently had a fine-needle aspiration of a nodule in the thyroid gland which was indeterminate.  She denies any history of heart palpitations, heat intolerance, neck irradiation, or family history of thyroid cancer.  She currently has no neck pain. Past Medical History:  Diagnosis Date  . Anemia   . Asthma    seasonal asthma rarely   . Chronic kidney disease    renal mass right kidney   . CPAP (continuous positive airway pressure) dependence   . Diverticulitis   . GERD (gastroesophageal reflux disease)   . Hemorrhoids   . Hyperlipemia   . Hypertension   . Migraine   . 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 PONV (postoperative nausea and vomiting)    patient reports being "slower to wake than average "   . Pre-diabetes    "ive been told i'm borderline"   . Sleep apnea    CPAP use     Past Surgical History:  Procedure Laterality Date  . ABDOMINAL HYSTERECTOMY    . APPENDECTOMY  1978  . BIOPSY  07/08/2017   Procedure: BIOPSY;  Surgeon: Rogene Houston, MD;  [facility];  Service: Endoscopy;;  gastric  . CHOLECYSTECTOMY  2005  . COLONOSCOPY WITH PROPOFOL N/A 10/28/2017   Procedure: COLONOSCOPY WITH PROPOFOL;  Surgeon: Rogene Houston, MD;  [facility];  Service: Endoscopy;  Laterality: N/A;  7:30  . ESOPHAGOGASTRODUODENOSCOPY N/A 07/08/2017   Procedure: ESOPHAGOGASTRODUODENOSCOPY (EGD);  Surgeon: Rogene Houston, MD;  [facility];  Service: Endoscopy;  Laterality: N/A;  220  . NASAL SEPTUM SURGERY    . ROBOTIC ASSITED PARTIAL NEPHRECTOMY Right 05/17/2017   Procedure: XI ROBOTIC ASSITED  PARTIAL NEPHRECTOMY;  Surgeon: Alexis Frock, MD;  [facility]: [facility];  Service: Urology;  Laterality: Right;    Family History  Problem Relation Age of Onset  . COPD Mother   . Diabetes Mother   . Hypertension Mother   . High Cholesterol Mother   . Liver cancer Mother   . COPD Sister   . Hypertension Brother   . High Cholesterol Brother   . Hypertension Brother   . Asthma Son     Current Outpatient Medications on File Prior to Visit  Medication Sig Dispense Refill  . baclofen (LIORESAL) 10 MG tablet Take 10 mg by mouth at bedtime.    . DULoxetine (CYMBALTA) 20 MG capsule Take 20 mg by mouth 2 (two) times daily.    . hydrochlorothiazide (HYDRODIURIL) 25 MG tablet Take 1 tablet (25 mg total) by mouth daily. 30 tablet 2  . lidocaine (LIDODERM) 5 % Place 1 patch onto the skin daily. Remove & Discard patch within 12 hours or as directed by MD (Patient taking differently: Place 1 patch onto the skin daily as needed (pain). Remove & Discard patch within 12 hours or as directed by MD) 30 patch 1  . losartan (COZAAR) 100 MG tablet Take 1 tablet (100 mg total) by mouth daily. 90 tablet 1  . ondansetron (ZOFRAN ODT) 4 MG disintegrating tablet Take 1 tablet (4 mg total) every 8 (eight) hours as needed by  mouth for nausea or vomiting. 4mg  ODT q4 hours prn nausea/vomit 30 tablet 0  . pantoprazole (PROTONIX) 20 MG tablet Take 20 mg by mouth 2 (two) times daily.    . potassium chloride SA (K-DUR,KLOR-CON) 20 MEQ tablet Take 20 mEq by mouth daily.     . rosuvastatin (CRESTOR) 5 MG tablet Take 5 mg by mouth daily.      Current Facility-Administered Medications on File Prior to Visit  Medication Dose Route Frequency Provider Last Rate Last Dose  . ondansetron (ZOFRAN-ODT) disintegrating tablet 4 mg  4 mg Oral Once Gottschalk, Ashly M, DO        Allergies  Allergen Reactions  . Iodinated Diagnostic Agents Hives and Itching  . Isovue [Iopamidol] Itching    And hives   . Nitrofurantoin Hives  .  Other Hives, Itching, Other (See Comments) and Rash    Paper tape ONLY  . Sulfa Antibiotics Hives  . Adhesive [Tape] Rash and Other (See Comments)    Paper tape ONLY  . Mango Flavor Swelling and Rash    Tongue swelling, feels like throat is closing in on her     Social History   Substance and Sexual Activity  Alcohol Use Yes   Comment: rarely     Social History   Tobacco Use  Smoking Status Never Smoker  Smokeless Tobacco Never Used    Review of Systems  Constitutional: Positive for malaise/fatigue.  HENT: Positive for sinus pain.   Eyes: Positive for blurred vision.  Respiratory: Negative.   Cardiovascular: Negative.   Gastrointestinal: Positive for abdominal pain, heartburn and nausea.  Genitourinary: Negative.   Musculoskeletal: Positive for back pain, joint pain and neck pain.  Skin: Negative.   Neurological: Positive for headaches.  Endo/Heme/Allergies: Negative.   Psychiatric/Behavioral: Negative.     Objective   Vitals:   03/04/18 1030  BP: (!) 167/88  Pulse: 66  Resp: 18  Temp: 98.7 F (37.1 C)    Physical Exam  Constitutional: She is oriented to person, place, and time. She appears well-developed and well-nourished.  HENT:  Head: Normocephalic and atraumatic.  Neck: Normal range of motion. Neck supple. No JVD present. No tracheal deviation present. Thyromegaly present.  Diffusely enlarged thyroid gland with nodularity noted.  No tracheal deviation noted.  Cardiovascular: Normal rate, regular rhythm and normal heart sounds. Exam reveals no gallop and no friction rub.  No murmur heard. Pulmonary/Chest: Effort normal and breath sounds normal. No stridor. No respiratory distress. She has no wheezes. She has no rales.  Lymphadenopathy:    She has no cervical adenopathy.  Neurological: She is alert and oriented to person, place, and time.  Skin: Skin is warm and dry.  Vitals reviewed.  Dr. Liliane Channel note reviewed.  Ultrasound report  reviewed. Assessment  Nodular goiter, indeterminate fine-needle aspiration of thyroid nodule Plan   Patient is scheduled for a total thyroidectomy on 03/10/2018.  The risks and benefits of the procedure including bleeding, infection, nerve injury, low calcium levels, and the possibility of malignancy were fully explained to the patient, who gave informed consent.

## 2018-03-05 ENCOUNTER — Ambulatory Visit (INDEPENDENT_AMBULATORY_CARE_PROVIDER_SITE_OTHER): Payer: 59 | Admitting: Family Medicine

## 2018-03-05 ENCOUNTER — Encounter: Payer: Self-pay | Admitting: Family Medicine

## 2018-03-05 VITALS — BP 114/72 | HR 69 | Temp 98.0°F | Ht 63.0 in | Wt 206.0 lb

## 2018-03-05 DIAGNOSIS — K760 Fatty (change of) liver, not elsewhere classified: Secondary | ICD-10-CM

## 2018-03-05 DIAGNOSIS — I10 Essential (primary) hypertension: Secondary | ICD-10-CM

## 2018-03-05 DIAGNOSIS — Z01818 Encounter for other preprocedural examination: Secondary | ICD-10-CM | POA: Diagnosis not present

## 2018-03-05 DIAGNOSIS — G4733 Obstructive sleep apnea (adult) (pediatric): Secondary | ICD-10-CM | POA: Diagnosis not present

## 2018-03-05 DIAGNOSIS — E782 Mixed hyperlipidemia: Secondary | ICD-10-CM

## 2018-03-05 NOTE — Patient Instructions (Signed)
Fatty Liver Fatty liver, also called hepatic steatosis or steatohepatitis, is a condition in which too much fat has built up in your liver cells. The liver removes harmful substances from your bloodstream. It produces fluids your body needs. It also helps your body use and store energy from the food you eat. In many cases, fatty liver does not cause symptoms or problems. It is often diagnosed when tests are being done for other reasons. However, over time, fatty liver can cause inflammation that may lead to more serious liver problems, such as scarring of the liver (cirrhosis). What are the causes? Causes of fatty liver may include:  Drinking too much alcohol.  Poor nutrition.  Obesity.  Cushing syndrome.  Diabetes.  Hyperlipidemia.  Pregnancy.  Certain drugs.  Poisons.  Some viral infections.  What increases the risk? You may be more likely to develop fatty liver if you:  Abuse alcohol.  Are pregnant.  Are overweight.  Have diabetes.  Have hepatitis.  Have a high triglyceride level.  What are the signs or symptoms? Fatty liver often does not cause any symptoms. In cases where symptoms develop, they can include:  Fatigue.  Weakness.  Weight loss.  Confusion.  Abdominal pain.  Yellowing of your skin and the white parts of your eyes (jaundice).  Nausea and vomiting.  How is this diagnosed? Fatty liver may be diagnosed by:  Physical exam and medical history.  Blood tests.  Imaging tests, such as an ultrasound, CT scan, or MRI.  Liver biopsy. A small sample of liver tissue is removed using a needle. The sample is then looked at under a microscope.  How is this treated? Fatty liver is often caused by other health conditions. Treatment for fatty liver may involve medicines and lifestyle changes to manage conditions such as:  Alcoholism.  High cholesterol.  Diabetes.  Being overweight or obese.  Follow these instructions at home:  Eat a  healthy diet as directed by your health care provider.  Exercise regularly. This can help you lose weight and control your cholesterol and diabetes. Talk to your health care provider about an exercise plan and which activities are best for you.  Do not drink alcohol.  Take medicines only as directed by your health care provider. Contact a health care provider if: You have difficulty controlling your:  Blood sugar.  Cholesterol.  Alcohol consumption.  Get help right away if:  You have abdominal pain.  You have jaundice.  You have nausea and vomiting. This information is not intended to replace advice given to you by your health care provider. Make sure you discuss any questions you have with your health care provider. Document Released: 10/12/2005 Document Revised: 02/02/2016 Document Reviewed: 01/06/2014 Elsevier Interactive Patient Education  2018 [employer].  High Cholesterol High cholesterol is a condition in which the blood has high levels of a white, waxy, fat-like substance (cholesterol). The human body needs small amounts of cholesterol. The liver makes all the cholesterol that the body needs. Extra (excess) cholesterol comes from the food that we eat. Cholesterol is carried from the liver by the blood through the blood vessels. If you have high cholesterol, deposits (plaques) may build up on the walls of your blood vessels (arteries). Plaques make the arteries narrower and stiffer. Cholesterol plaques increase your risk for heart attack and stroke. Work with your health care provider to keep your cholesterol levels in a healthy range. What increases the risk? This condition is more likely to develop in people  who:  Eat foods that are high in animal fat (saturated fat) or cholesterol.  Are overweight.  Are not getting enough exercise.  Have a family history of high cholesterol.  What are the signs or symptoms? There are no symptoms of this condition. How is this  diagnosed? This condition may be diagnosed from the results of a blood test.  If you are older than age 40, your health care provider may check your cholesterol every 4-6 years.  You may be checked more often if you already have high cholesterol or other risk factors for heart disease.  The blood test for cholesterol measures:  "Bad" cholesterol (LDL cholesterol). This is the main type of cholesterol that causes heart disease. The desired level for LDL is less than 100.  "Good" cholesterol (HDL cholesterol). This type helps to protect against heart disease by cleaning the arteries and carrying the LDL away. The desired level for HDL is 60 or higher.  Triglycerides. These are fats that the body can store or burn for energy. The desired number for triglycerides is lower than 150.  Total cholesterol. This is a measure of the total amount of cholesterol in your blood, including LDL cholesterol, HDL cholesterol, and triglycerides. A healthy number is less than 200.  How is this treated? This condition is treated with diet changes, lifestyle changes, and medicines. Diet changes  This may include eating more whole grains, fruits, vegetables, nuts, and fish.  This may also include cutting back on red meat and foods that have a lot of added sugar. Lifestyle changes  Changes may include getting at least 40 minutes of aerobic exercise 3 times a week. Aerobic exercises include walking, biking, and swimming. Aerobic exercise along with a healthy diet can help you maintain a healthy weight.  Changes may also include quitting smoking. Medicines  Medicines are usually given if diet and lifestyle changes have failed to reduce your cholesterol to healthy levels.  Your health care provider may prescribe a statin medicine. Statin medicines have been shown to reduce cholesterol, which can reduce the risk of heart disease. Follow these instructions at home: Eating and drinking  If told by your health  care provider:  Eat chicken (without skin), fish, veal, shellfish, ground [place] breast, and round or loin cuts of red meat.  Do not eat fried foods or fatty meats, such as hot dogs and salami.  Eat plenty of fruits, such as apples.  Eat plenty of vegetables, such as broccoli, potatoes, and carrots.  Eat beans, peas, and lentils.  Eat grains such as barley, rice, couscous, and bulgur wheat.  Eat pasta without cream sauces.  Use skim or nonfat milk, and eat low-fat or nonfat yogurt and cheeses.  Do not eat or drink whole milk, cream, ice cream, egg yolks, or hard cheeses.  Do not eat stick margarine or tub margarines that contain trans fats (also called partially hydrogenated oils).  Do not eat saturated tropical oils, such as coconut oil and palm oil.  Do not eat cakes, cookies, crackers, or other baked goods that contain trans fats.  General instructions  Exercise as directed by your health care provider. Increase your activity level with activities such as gardening, walking, and taking the stairs.  Take over-the-counter and prescription medicines only as told by your health care provider.  Do not use any products that contain nicotine or tobacco, such as cigarettes and e-cigarettes. If you need help quitting, ask your health care provider.  Keep all follow-up visits  as told by your health care provider. This is important. Contact a health care provider if:  You are struggling to maintain a healthy diet or weight.  You need help to start on an exercise program.  You need help to stop smoking. Get help right away if:  You have chest pain.  You have trouble breathing. This information is not intended to replace advice given to you by your health care provider. Make sure you discuss any questions you have with your health care provider. Document Released: 08/27/2005 Document Revised: 03/24/2016 Document Reviewed: 02/25/2016 Elsevier Interactive Patient Education  [employer].

## 2018-03-05 NOTE — Patient Instructions (Signed)
Rebecca Scott  03/05/2018     @PREFPERIOPPHARMACY @   Your procedure is scheduled on  03/10/2018   Report to [facility] at  910  A.M.  Call this number if you have problems the morning of surgery:  [phone]   Remember:  Do not eat or drink after midnight.  You may drink clear liquids until  12 midnight 03/09/2018 .  Clear liquids allowed are:                    Water, Juice (non-citric and without pulp), Carbonated beverages, Clear Tea, Black Coffee only, Plain Jell-O only, Gatorade and Plain Popsicles only    Take these medicines the morning of surgery with A SIP OF WATER  Cymbalta, losartan, zofran, protonix.    Do not wear jewelry, make-up or nail polish.  Do not wear lotions, powders, or perfumes, or deodorant.  Do not shave 48 hours prior to surgery.  Men may shave face and neck.  Do not bring valuables to the hospital.  [facility] is not responsible for any belongings or valuables.  Contacts, dentures or bridgework may not be worn into surgery.  Leave your suitcase in the car.  After surgery it may be brought to your room.  For patients admitted to the hospital, discharge time will be determined by your treatment team.  Patients discharged the day of surgery will not be allowed to drive home.   Name and phone number of your driver:   family Special instructions:  None  Please read over the following fact sheets that you were given. Pain Booklet, Coughing and Deep Breathing, Blood Transfusion Information, Lab Information, Surgical Site Infection Prevention, Anesthesia Post-op Instructions and Care and Recovery After Surgery       Thyroidectomy A thyroidectomy is a surgery that is done to remove all (total thyroidectomy) or part (subtotal thyroidectomy) of your thyroid gland. The thyroid is a butterfly-shaped gland that is located at the lower front of your neck. It produces a substance that helps to control certain body processes (thyroid  hormone). The amount of thyroid gland tissue that is removed during your thyroidectomy depends on the reason you need the procedure. You may have a thyroidectomy to treat conditions including:  Thyroid nodules.  Thyroid cancer.  Benign thyroid tumors.  Goiter.  Overactive thyroid gland (hyperthyroidism).  There are different ways to do a thyroidectomy:  Conventional thyroidectomy (open thyroidectomy). This procedure is the most common. In this procedure, the thyroid gland is removed through one surgical cut (incision) in the neck.  Endoscopic thyroidectomy. This procedure is less invasive. In this procedure, there may be several smaller incisions in the neck, chest, or armpit. The surgeon uses a tiny camera and other assistive tools to remove the thyroid gland.  Tell a health care provider about:  Any allergies you have.  All medicines you are taking, including vitamins, herbs, eye drops, creams, and over-the-counter medicines.  Any problems you or family members have had with anesthetic medicines.  Any blood disorders you have.  Any surgeries you have had.  Any medical conditions you have. What are the risks? Generally, this is a safe procedure. However, problems can occur and include:  A decrease in parathyroid hormone levels (hypoparathyroidism). Your parathyroid glands are located behind your thyroid gland, and they maintain the calcium level in your body. If these glands are damaged during surgery, your calcium level will drop. This will make  your nerves irritable and cause muscle spasms.  An increase in thyroid hormone.  Damage to the nerves of your voice box (larynx).  Bleeding.  Infection at the site of the incision or incisions.  Temporary breathing difficulties. This is a very rare complication. It usually goes away within weeks.  What happens before the procedure?  Your health care provider will perform a physical exam and assess your voice for vocal  changes.  Ask your health care provider about: ? Changing or stopping your regular medicines. This is especially important if you are taking diabetes medicines or blood thinners. ? Taking medicines such as aspirin and ibuprofen. These medicines can thin your blood. Do not take these medicines before your procedure if your health care provider instructs you not to.  Follow instructions from your health care provider about eating or drinking restrictions. What happens during the procedure? You will be given a medicine that makes you go to sleep (general anesthetic). Depending on which type of thyroidectomy you have, this is what may happen during the procedure: Conventional Thyroidectomy  The surgeon will make an incision in the center of your lower neck.  The muscles in your neck will be separated to reveal your thyroid gland.  Part or all of your thyroid gland will be removed.  You may need a tube (catheter) at the incision site to drain blood and fluids that accumulate under the skin after the procedure.The catheter may have to stay in place for a day or two after the procedure.  The incision will be closed with stitches (sutures). Endoscopic Thyroidectomy  The surgeon will make several small incisions in your neck, chest, or armpit.  The surgeon will use a narrow tube with a light and camera at the end (endoscope). The surgeon will insert the endoscope into an incision.  Part or all of your thyroid gland will be removed.  You may need a catheter at the incision site to drain blood and fluids that accumulate under the skin after the procedure.The catheter may have to stay in place for a day or two after the procedure.  The incision will be closed with sutures. What happens after the procedure?  Your blood pressure, heart rate, breathing rate, and blood oxygen level will be monitored often until the medicines you were given have worn off.  Depending on the type of thyroidectomy  you had, you may have: ? A swollen neck. ? Some mild neck pain. ? A slightly sore throat. ? A weak voice.  You will not be able to eat or drink until your health care provider says it is okay.  You may have a blood test to check the level of calcium in your body.  If you had a catheter put in during the procedure, it will usually be removed the next day. This information is not intended to replace advice given to you by your health care provider. Make sure you discuss any questions you have with your health care provider. Document Released: 02/20/2001 Document Revised: 04/29/2016 Document Reviewed: 01/27/2014 Elsevier Interactive Patient Education  2018 [place]. Thyroidectomy, Care After Refer to this sheet in the next few weeks. These instructions provide you with information about caring for yourself after your procedure. Your health care provider may also give you more specific instructions. Your treatment has been planned according to current medical practices, but problems sometimes occur. Call your health care provider if you have any problems or questions after your procedure. What can I expect after  the procedure? After your procedure, it is typical to have:  Mild pain in the neck or upper body, especially when swallowing.  A sore throat.  A weak voice.  Follow these instructions at home:  Take medicines only as directed by your health care provider.  If your entire thyroid gland was removed, you may need to take thyroid hormone medicine from now on.  Do not take medicines that contain aspirin and ibuprofen until your health care provider says that you can. These medicines can increase your risk of bleeding.  Some pain medicines cause constipation. Drink enough fluid to keep your urine clear or pale yellow. This can help to prevent constipation.  Start slowly with eating. You may need to have only liquids and soft foods for a few days or as directed by your health  care provider.  Do not take baths, swim, or use a hot tub until your health care provider approves.  There are many different ways to close and cover an incision, including stitches (sutures), skin glue, and adhesive strips. Follow your health care provider's instructions for: ? Incision care. ? Bandage (dressing) changes and removal. ? Incision closure removal.  Resume your usual activities as directed by your health care provider.  For the first 10 days after the procedure or as instructed by your health care provider: ? Do not lift anything heavier than 20 lb (9.1 kg). ? Do not jog, swim, or do other strenuous exercises. ? Do not play contact sports.  Keep all follow-up visits as directed by your health care provider. This is important. Contact a health care provider if:  The soreness in your throat gets worse.  You have increased pain at your incision or incisions.  You have increased bleeding from an incision.  Your incision becomes infected. Watch for: ? Swelling. ? Redness. ? Warmth. ? Pus.  You notice a bad smell coming from an incision or dressing.  You have a fever.  You feel lightheaded or faint.  You have numbness, tingling, or muscle spasms in your: ? Arms. ? Hands. ? Feet. ? Face.  You have trouble swallowing. Get help right away if:  You develop a rash.  You have difficulty breathing.  You hear whistling noises coming from your chest.  You develop a cough that gets worse.  Your speech changes, or you have hoarseness that gets worse. This information is not intended to replace advice given to you by your health care provider. Make sure you discuss any questions you have with your health care provider. Document Released: 03/16/2005 Document Revised: 04/29/2016 Document Reviewed: 01/27/2014 Elsevier Interactive Patient Education  2018 [place] Anesthesia, Adult General anesthesia is the use of medicines to make a person "go to  sleep" (be unconscious) for a medical procedure. General anesthesia is often recommended when a procedure:  Is long.  Requires you to be still or in an unusual position.  Is major and can cause you to lose blood.  Is impossible to do without general anesthesia.  The medicines used for general anesthesia are called general anesthetics. In addition to making you sleep, the medicines:  Prevent pain.  Control your blood pressure.  Relax your muscles.  Tell a health care provider about:  Any allergies you have.  All medicines you are taking, including vitamins, herbs, eye drops, creams, and over-the-counter medicines.  Any problems you or family members have had with anesthetic medicines.  Types of anesthetics you have had in the past.  Any bleeding disorders you have.  Any surgeries you have had.  Any medical conditions you have.  Any history of heart or lung conditions, such as heart failure, sleep apnea, or chronic obstructive pulmonary disease (COPD).  Whether you are pregnant or may be pregnant.  Whether you use tobacco, alcohol, marijuana, or street drugs.  Any history of Armed forces logistics/support/administrative officer.  Any history of depression or anxiety. What are the risks? Generally, this is a safe procedure. However, problems may occur, including:  Allergic reaction to anesthetics.  Lung and heart problems.  Inhaling food or liquids from your stomach into your lungs (aspiration).  Injury to nerves.  Waking up during your procedure and being unable to move (rare).  Extreme agitation or a state of mental confusion (delirium) when you wake up from the anesthetic.  Air in the bloodstream, which can lead to stroke.  These problems are more likely to develop if you are having a major surgery or if you have an advanced medical condition. You can prevent some of these complications by answering all of your health care provider's questions thoroughly and by following all pre-procedure  instructions. General anesthesia can cause side effects, including:  Nausea or vomiting  A sore throat from the breathing tube.  Feeling cold or shivery.  Feeling tired, washed out, or achy.  Sleepiness or drowsiness.  Confusion or agitation.  What happens before the procedure? Staying hydrated Follow instructions from your health care provider about hydration, which may include:  Up to 2 hours before the procedure - you may continue to drink clear liquids, such as water, clear fruit juice, black coffee, and plain tea.  Eating and drinking restrictions Follow instructions from your health care provider about eating and drinking, which may include:  8 hours before the procedure - stop eating heavy meals or foods such as meat, fried foods, or fatty foods.  6 hours before the procedure - stop eating light meals or foods, such as toast or cereal.  6 hours before the procedure - stop drinking milk or drinks that contain milk.  2 hours before the procedure - stop drinking clear liquids.  Medicines  Ask your health care provider about: ? Changing or stopping your regular medicines. This is especially important if you are taking diabetes medicines or blood thinners. ? Taking medicines such as aspirin and ibuprofen. These medicines can thin your blood. Do not take these medicines before your procedure if your health care provider instructs you not to. ? Taking new dietary supplements or medicines. Do not take these during the week before your procedure unless your health care provider approves them.  If you are told to take a medicine or to continue taking a medicine on the day of the procedure, take the medicine with sips of water. General instructions   Ask if you will be going home the same day, the following day, or after a longer hospital stay. ? Plan to have someone take you home. ? Plan to have someone stay with you for the first 24 hours after you leave the hospital or  clinic.  For 3-6 weeks before the procedure, try not to use any tobacco products, such as cigarettes, chewing tobacco, and e-cigarettes.  You may brush your teeth on the morning of the procedure, but make sure to spit out the toothpaste. What happens during the procedure?  You will be given anesthetics through a mask and through an IV tube in one of your veins.  You may receive medicine  to help you relax (sedative).  As soon as you are asleep, a breathing tube may be used to help you breathe.  An anesthesia specialist will stay with you throughout the procedure. He or she will help keep you comfortable and safe by continuing to give you medicines and adjusting the amount of medicine that you get. He or she will also watch your blood pressure, pulse, and oxygen levels to make sure that the anesthetics do not cause any problems.  If a breathing tube was used to help you breathe, it will be removed before you wake up. The procedure may vary among health care providers and hospitals. What happens after the procedure?  You will wake up, often slowly, after the procedure is complete, usually in a recovery area.  Your blood pressure, heart rate, breathing rate, and blood oxygen level will be monitored until the medicines you were given have worn off.  You may be given medicine to help you calm down if you feel anxious or agitated.  If you will be going home the same day, your health care provider may check to make sure you can stand, drink, and urinate.  Your health care providers will treat your pain and side effects before you go home.  Do not drive for 24 hours if you received a sedative.  You may: ? Feel nauseous and vomit. ? Have a sore throat. ? Have mental slowness. ? Feel cold or shivery. ? Feel sleepy. ? Feel tired. ? Feel sore or achy, even in parts of your body where you did not have surgery. This information is not intended to replace advice given to you by your health  care provider. Make sure you discuss any questions you have with your health care provider. Document Released: 12/04/2007 Document Revised: 02/07/2016 Document Reviewed: 08/11/2015 Elsevier Interactive Patient Education  2018 [place] Anesthesia, Adult, Care After These instructions provide you with information about caring for yourself after your procedure. Your health care provider may also give you more specific instructions. Your treatment has been planned according to current medical practices, but problems sometimes occur. Call your health care provider if you have any problems or questions after your procedure. What can I expect after the procedure? After the procedure, it is common to have:  Vomiting.  A sore throat.  Mental slowness.  It is common to feel:  Nauseous.  Cold or shivery.  Sleepy.  Tired.  Sore or achy, even in parts of your body where you did not have surgery.  Follow these instructions at home: For at least 24 hours after the procedure:  Do not: ? Participate in activities where you could fall or become injured. ? Drive. ? Use heavy machinery. ? Drink alcohol. ? Take sleeping pills or medicines that cause drowsiness. ? Make important decisions or sign legal documents. ? Take care of children on your own.  Rest. Eating and drinking  If you vomit, drink water, juice, or soup when you can drink without vomiting.  Drink enough fluid to keep your urine clear or pale yellow.  Make sure you have little or no nausea before eating solid foods.  Follow the diet recommended by your health care provider. General instructions  Have a responsible adult stay with you until you are awake and alert.  Return to your normal activities as told by your health care provider. Ask your health care provider what activities are safe for you.  Take over-the-counter and prescription medicines only as told by  your health care provider.  If you smoke,  do not smoke without supervision.  Keep all follow-up visits as told by your health care provider. This is important. Contact a health care provider if:  You continue to have nausea or vomiting at home, and medicines are not helpful.  You cannot drink fluids or start eating again.  You cannot urinate after 8-12 hours.  You develop a skin rash.  You have fever.  You have increasing redness at the site of your procedure. Get help right away if:  You have difficulty breathing.  You have chest pain.  You have unexpected bleeding.  You feel that you are having a life-threatening or urgent problem. This information is not intended to replace advice given to you by your health care provider. Make sure you discuss any questions you have with your health care provider. Document Released: 12/03/2000 Document Revised: 01/30/2016 Document Reviewed: 08/11/2015 Elsevier Interactive Patient Education  [employer].

## 2018-03-06 ENCOUNTER — Encounter (HOSPITAL_COMMUNITY)
Admission: RE | Admit: 2018-03-06 | Discharge: 2018-03-06 | Disposition: A | Discharge status: Home / Self Care | Payer: 59 | Source: Ambulatory Visit | Attending: General Surgery | Admitting: General Surgery

## 2018-03-06 ENCOUNTER — Encounter (HOSPITAL_COMMUNITY): Payer: Self-pay

## 2018-03-06 ENCOUNTER — Other Ambulatory Visit: Payer: Self-pay

## 2018-03-06 ENCOUNTER — Ambulatory Visit (HOSPITAL_COMMUNITY)
Admission: RE | Admit: 2018-03-06 | Discharge: 2018-03-06 | Disposition: A | Discharge status: Home / Self Care | Payer: 59 | Source: Ambulatory Visit | Attending: General Surgery | Admitting: General Surgery

## 2018-03-06 DIAGNOSIS — R9431 Abnormal electrocardiogram [ECG] [EKG]: Secondary | ICD-10-CM | POA: Diagnosis not present

## 2018-03-06 DIAGNOSIS — E049 Nontoxic goiter, unspecified: Secondary | ICD-10-CM | POA: Insufficient documentation

## 2018-03-06 DIAGNOSIS — Z01818 Encounter for other preprocedural examination: Secondary | ICD-10-CM | POA: Diagnosis present

## 2018-03-06 DIAGNOSIS — Z01812 Encounter for preprocedural laboratory examination: Secondary | ICD-10-CM | POA: Insufficient documentation

## 2018-03-06 DIAGNOSIS — Z0183 Encounter for blood typing: Secondary | ICD-10-CM | POA: Diagnosis not present

## 2018-03-06 HISTORY — DX: Unspecified osteoarthritis, unspecified site: M19.90

## 2018-03-06 HISTORY — DX: Personal history of urinary calculi: Z87.442

## 2018-03-06 LAB — COMPREHENSIVE METABOLIC PANEL
ALBUMIN: 4 g/dL (ref 3.5–5.0)
ALT: 24 U/L (ref 0–44)
ANION GAP: 9 (ref 5–15)
AST: 28 U/L (ref 15–41)
Alkaline Phosphatase: 107 U/L (ref 38–126)
BILIRUBIN TOTAL: 0.8 mg/dL (ref 0.3–1.2)
BUN: 15 mg/dL (ref 6–20)
CALCIUM: 9.1 mg/dL (ref 8.9–10.3)
CO2: 25 mmol/L (ref 22–32)
CREATININE: 0.79 mg/dL (ref 0.44–1.00)
Chloride: 105 mmol/L (ref 98–111)
GFR calc Af Amer: >60 mL/min (ref >60)
GFR calc non Af Amer: >60 mL/min (ref >60)
GLUCOSE: 110 mg/dL — AB (ref 70–99)
Potassium: 3.3 mmol/L — ABNORMAL LOW (ref 3.5–5.1)
Sodium: 139 mmol/L (ref 135–145)
Total Protein: 7.2 g/dL (ref 6.5–8.1)

## 2018-03-06 LAB — CBC WITH DIFFERENTIAL/PLATELET
BASOS ABS: 0 10*3/uL (ref 0.0–0.1)
BASOS PCT: 0 %
EOS ABS: 0.2 10*3/uL (ref 0.0–0.7)
Eosinophils Relative: 3 %
HEMATOCRIT: 39.4 % (ref 36.0–46.0)
HEMOGLOBIN: 12.3 g/dL (ref 12.0–15.0)
Lymphocytes Relative: 19 %
Lymphs Abs: 1.4 10*3/uL (ref 0.7–4.0)
MCH: 27.6 pg (ref 26.0–34.0)
MCHC: 31.2 g/dL (ref 30.0–36.0)
MCV: 88.3 fL (ref 78.0–100.0)
Monocytes Absolute: 0.7 10*3/uL (ref 0.1–1.0)
Monocytes Relative: 10 %
NEUTROS ABS: 4.8 10*3/uL (ref 1.7–7.7)
NEUTROS PCT: 68 %
Platelets: 163 10*3/uL (ref 150–400)
RBC: 4.46 MIL/uL (ref 3.87–5.11)
RDW: 16 % — ABNORMAL HIGH (ref 11.5–15.5)
WBC: 7.1 10*3/uL (ref 4.0–10.5)

## 2018-03-06 LAB — TYPE AND SCREEN
ABO/RH(D): O POS
Antibody Screen: NEGATIVE

## 2018-03-06 LAB — HEMOGLOBIN A1C
Hgb A1c MFr Bld: 5.4 % (ref 4.8–5.6)
MEAN PLASMA GLUCOSE: 108.28 mg/dL

## 2018-03-06 LAB — GLUCOSE, CAPILLARY: Glucose-Capillary: 109 mg/dL — ABNORMAL HIGH (ref 70–99)

## 2018-03-10 ENCOUNTER — Observation Stay (HOSPITAL_COMMUNITY)
Admission: RE | Admit: 2018-03-10 | Discharge: 2018-03-11 | Disposition: A | Discharge status: Home / Self Care | Payer: 59 | Source: Ambulatory Visit | Attending: General Surgery | Admitting: General Surgery

## 2018-03-10 ENCOUNTER — Ambulatory Visit (HOSPITAL_COMMUNITY): Payer: 59 | Admitting: Anesthesiology

## 2018-03-10 ENCOUNTER — Encounter (HOSPITAL_COMMUNITY): Payer: Self-pay | Admitting: *Deleted

## 2018-03-10 ENCOUNTER — Encounter (HOSPITAL_COMMUNITY)
Admission: RE | Disposition: A | Discharge status: Home / Self Care | Payer: Self-pay | Source: Ambulatory Visit | Attending: General Surgery

## 2018-03-10 ENCOUNTER — Other Ambulatory Visit: Payer: Self-pay

## 2018-03-10 DIAGNOSIS — E785 Hyperlipidemia, unspecified: Secondary | ICD-10-CM | POA: Insufficient documentation

## 2018-03-10 DIAGNOSIS — F329 Major depressive disorder, single episode, unspecified: Secondary | ICD-10-CM | POA: Insufficient documentation

## 2018-03-10 DIAGNOSIS — D649 Anemia, unspecified: Secondary | ICD-10-CM | POA: Diagnosis not present

## 2018-03-10 DIAGNOSIS — C73 Malignant neoplasm of thyroid gland: Secondary | ICD-10-CM

## 2018-03-10 DIAGNOSIS — Z9089 Acquired absence of other organs: Secondary | ICD-10-CM

## 2018-03-10 DIAGNOSIS — E042 Nontoxic multinodular goiter: Secondary | ICD-10-CM | POA: Diagnosis not present

## 2018-03-10 DIAGNOSIS — I10 Essential (primary) hypertension: Secondary | ICD-10-CM | POA: Diagnosis not present

## 2018-03-10 DIAGNOSIS — G4733 Obstructive sleep apnea (adult) (pediatric): Secondary | ICD-10-CM | POA: Diagnosis not present

## 2018-03-10 DIAGNOSIS — Z79899 Other long term (current) drug therapy: Secondary | ICD-10-CM | POA: Insufficient documentation

## 2018-03-10 DIAGNOSIS — R7303 Prediabetes: Secondary | ICD-10-CM | POA: Insufficient documentation

## 2018-03-10 DIAGNOSIS — E890 Postprocedural hypothyroidism: Secondary | ICD-10-CM

## 2018-03-10 DIAGNOSIS — K219 Gastro-esophageal reflux disease without esophagitis: Secondary | ICD-10-CM | POA: Insufficient documentation

## 2018-03-10 HISTORY — PX: THYROIDECTOMY: SHX17

## 2018-03-10 HISTORY — DX: Malignant neoplasm of thyroid gland: C73

## 2018-03-10 LAB — COMPREHENSIVE METABOLIC PANEL
ALT: 24 U/L (ref 0–44)
AST: 23 U/L (ref 15–41)
Albumin: 3.6 g/dL (ref 3.5–5.0)
Alkaline Phosphatase: 89 U/L (ref 38–126)
Anion gap: 6 (ref 5–15)
BILIRUBIN TOTAL: 0.8 mg/dL (ref 0.3–1.2)
BUN: 15 mg/dL (ref 6–20)
CO2: 29 mmol/L (ref 22–32)
CREATININE: 0.74 mg/dL (ref 0.44–1.00)
Calcium: 8.6 mg/dL — ABNORMAL LOW (ref 8.9–10.3)
Chloride: 103 mmol/L (ref 98–111)
Glucose, Bld: 140 mg/dL — ABNORMAL HIGH (ref 70–99)
POTASSIUM: 3.9 mmol/L (ref 3.5–5.1)
Sodium: 138 mmol/L (ref 135–145)
TOTAL PROTEIN: 6.8 g/dL (ref 6.5–8.1)

## 2018-03-10 LAB — GLUCOSE, CAPILLARY: GLUCOSE-CAPILLARY: 97 mg/dL (ref 70–99)

## 2018-03-10 SURGERY — THYROIDECTOMY
Anesthesia: General | Site: Neck

## 2018-03-10 MED ORDER — MIDAZOLAM HCL 2 MG/2ML IJ SOLN
INTRAMUSCULAR | Status: AC
  Filled 2018-03-10: qty 2

## 2018-03-10 MED ORDER — LEVOTHYROXINE SODIUM 100 MCG PO TABS
100.0000 ug | ORAL_TABLET | Freq: Every day | ORAL | Status: DC
  Administered 2018-03-11: 100 ug via ORAL
  Filled 2018-03-10: qty 1

## 2018-03-10 MED ORDER — PROPOFOL 10 MG/ML IV BOLUS
INTRAVENOUS | Status: AC
  Filled 2018-03-10: qty 40

## 2018-03-10 MED ORDER — BUPIVACAINE LIPOSOME 1.3 % IJ SUSP
INTRAMUSCULAR | Status: DC | PRN
  Administered 2018-03-10: 10 mL

## 2018-03-10 MED ORDER — ENOXAPARIN SODIUM 40 MG/0.4ML ~~LOC~~ SOLN: 40.0000 mg | SUBCUTANEOUS | Status: DC

## 2018-03-10 MED ORDER — HYDROCODONE-ACETAMINOPHEN 7.5-325 MG PO TABS: 1.0000 | ORAL_TABLET | Freq: Once | ORAL | Status: DC | PRN

## 2018-03-10 MED ORDER — FENTANYL CITRATE (PF) 100 MCG/2ML IJ SOLN
INTRAMUSCULAR | Status: DC | PRN
  Administered 2018-03-10 (×2): 50 ug via INTRAVENOUS

## 2018-03-10 MED ORDER — ONDANSETRON HCL 4 MG/2ML IJ SOLN
INTRAMUSCULAR | Status: AC
  Filled 2018-03-10: qty 6

## 2018-03-10 MED ORDER — PROPOFOL 10 MG/ML IV BOLUS
INTRAVENOUS | Status: DC | PRN
  Administered 2018-03-10: 160 mg via INTRAVENOUS

## 2018-03-10 MED ORDER — ROSUVASTATIN CALCIUM 10 MG PO TABS
5.0000 mg | ORAL_TABLET | Freq: Every day | ORAL | Status: DC
  Administered 2018-03-10 – 2018-03-11 (×2): 5 mg via ORAL
  Filled 2018-03-10 (×2): qty 1

## 2018-03-10 MED ORDER — BACLOFEN 10 MG PO TABS
10.0000 mg | ORAL_TABLET | Freq: Every day | ORAL | Status: DC
  Administered 2018-03-10: 10 mg via ORAL
  Filled 2018-03-10: qty 1

## 2018-03-10 MED ORDER — HYDROMORPHONE HCL 1 MG/ML IJ SOLN
0.2500 mg | INTRAMUSCULAR | Status: DC | PRN
  Administered 2018-03-10 (×2): 0.5 mg via INTRAVENOUS

## 2018-03-10 MED ORDER — HYDROCHLOROTHIAZIDE 25 MG PO TABS
25.0000 mg | ORAL_TABLET | Freq: Every day | ORAL | Status: DC
  Administered 2018-03-10 – 2018-03-11 (×2): 25 mg via ORAL
  Filled 2018-03-10 (×2): qty 1

## 2018-03-10 MED ORDER — CALCIUM CARBONATE 1250 (500 CA) MG PO TABS
1.0000 | ORAL_TABLET | Freq: Two times a day (BID) | ORAL | Status: DC
  Administered 2018-03-10 – 2018-03-11 (×2): 500 mg via ORAL
  Filled 2018-03-10 (×2): qty 1

## 2018-03-10 MED ORDER — ONDANSETRON HCL 4 MG/2ML IJ SOLN
4.0000 mg | Freq: Four times a day (QID) | INTRAMUSCULAR | Status: DC | PRN
  Administered 2018-03-10: 4 mg via INTRAVENOUS
  Filled 2018-03-10: qty 2

## 2018-03-10 MED ORDER — PROMETHAZINE HCL 25 MG/ML IJ SOLN: 6.2500 mg | INTRAMUSCULAR | Status: DC | PRN

## 2018-03-10 MED ORDER — MEPERIDINE HCL 50 MG/ML IJ SOLN: 6.2500 mg | INTRAMUSCULAR | Status: DC | PRN

## 2018-03-10 MED ORDER — FENTANYL CITRATE (PF) 100 MCG/2ML IJ SOLN
INTRAMUSCULAR | Status: AC
  Filled 2018-03-10: qty 4

## 2018-03-10 MED ORDER — DEXAMETHASONE SODIUM PHOSPHATE 4 MG/ML IJ SOLN
INTRAMUSCULAR | Status: DC | PRN
  Administered 2018-03-10: 4 mg via INTRAVENOUS

## 2018-03-10 MED ORDER — LORAZEPAM 2 MG/ML IJ SOLN: 1.0000 mg | INTRAMUSCULAR | Status: DC | PRN

## 2018-03-10 MED ORDER — KETOROLAC TROMETHAMINE 30 MG/ML IJ SOLN
30.0000 mg | Freq: Four times a day (QID) | INTRAMUSCULAR | Status: DC | PRN
  Administered 2018-03-11: 30 mg via INTRAVENOUS
  Filled 2018-03-10: qty 1

## 2018-03-10 MED ORDER — ACETAMINOPHEN 325 MG PO TABS: 650.0000 mg | ORAL_TABLET | Freq: Four times a day (QID) | ORAL | Status: DC | PRN

## 2018-03-10 MED ORDER — PHENYLEPHRINE 40 MCG/ML (10ML) SYRINGE FOR IV PUSH (FOR BLOOD PRESSURE SUPPORT)
PREFILLED_SYRINGE | INTRAVENOUS | Status: AC
  Filled 2018-03-10: qty 10

## 2018-03-10 MED ORDER — ONDANSETRON 4 MG PO TBDP
4.0000 mg | ORAL_TABLET | Freq: Four times a day (QID) | ORAL | Status: DC | PRN
  Filled 2018-03-10: qty 1

## 2018-03-10 MED ORDER — LACTATED RINGERS IV SOLN: INTRAVENOUS | Status: DC

## 2018-03-10 MED ORDER — POTASSIUM CHLORIDE CRYS ER 20 MEQ PO TBCR
20.0000 meq | EXTENDED_RELEASE_TABLET | Freq: Every day | ORAL | Status: DC
  Administered 2018-03-10 – 2018-03-11 (×2): 20 meq via ORAL
  Filled 2018-03-10 (×2): qty 1

## 2018-03-10 MED ORDER — HYDROCODONE-ACETAMINOPHEN 5-325 MG PO TABS: 1.0000 | ORAL_TABLET | ORAL | Status: DC | PRN

## 2018-03-10 MED ORDER — ROCURONIUM BROMIDE 50 MG/5ML IV SOLN
INTRAVENOUS | Status: AC
  Filled 2018-03-10: qty 1

## 2018-03-10 MED ORDER — MIDAZOLAM HCL 5 MG/5ML IJ SOLN
INTRAMUSCULAR | Status: DC | PRN
  Administered 2018-03-10 (×2): 2 mg via INTRAVENOUS

## 2018-03-10 MED ORDER — ONDANSETRON HCL 4 MG/2ML IJ SOLN
INTRAMUSCULAR | Status: DC | PRN
  Administered 2018-03-10: 4 mg via INTRAVENOUS

## 2018-03-10 MED ORDER — PANTOPRAZOLE SODIUM 40 MG PO TBEC
40.0000 mg | DELAYED_RELEASE_TABLET | Freq: Two times a day (BID) | ORAL | Status: DC
  Administered 2018-03-10 – 2018-03-11 (×2): 40 mg via ORAL
  Filled 2018-03-10 (×2): qty 1

## 2018-03-10 MED ORDER — HYDROMORPHONE HCL 1 MG/ML IJ SOLN
INTRAMUSCULAR | Status: AC
  Filled 2018-03-10: qty 0.5

## 2018-03-10 MED ORDER — LOSARTAN POTASSIUM 50 MG PO TABS
100.0000 mg | ORAL_TABLET | Freq: Every day | ORAL | Status: DC
  Administered 2018-03-11: 100 mg via ORAL
  Filled 2018-03-10: qty 2

## 2018-03-10 MED ORDER — MENTHOL 3 MG MT LOZG: 1.0000 | LOZENGE | OROMUCOSAL | Status: DC | PRN

## 2018-03-10 MED ORDER — HEMOSTATIC AGENTS (NO CHARGE) OPTIME
TOPICAL | Status: DC | PRN
  Administered 2018-03-10 (×2): 1 via TOPICAL

## 2018-03-10 MED ORDER — DULOXETINE HCL 20 MG PO CPEP
20.0000 mg | ORAL_CAPSULE | Freq: Two times a day (BID) | ORAL | Status: DC
  Administered 2018-03-10 – 2018-03-11 (×2): 20 mg via ORAL
  Filled 2018-03-10 (×2): qty 1

## 2018-03-10 MED ORDER — SIMETHICONE 80 MG PO CHEW: 40.0000 mg | CHEWABLE_TABLET | Freq: Four times a day (QID) | ORAL | Status: DC | PRN

## 2018-03-10 MED ORDER — BUPIVACAINE LIPOSOME 1.3 % IJ SUSP
INTRAMUSCULAR | Status: AC
  Filled 2018-03-10: qty 20

## 2018-03-10 MED ORDER — LACTATED RINGERS IV SOLN
INTRAVENOUS | Status: DC
  Administered 2018-03-10: 07:00:00 via INTRAVENOUS

## 2018-03-10 MED ORDER — SUGAMMADEX SODIUM 200 MG/2ML IV SOLN
INTRAVENOUS | Status: DC | PRN
  Administered 2018-03-10: 200 mg via INTRAVENOUS

## 2018-03-10 MED ORDER — CHLORHEXIDINE GLUCONATE CLOTH 2 % EX PADS: 6.0000 | MEDICATED_PAD | Freq: Once | CUTANEOUS | Status: DC

## 2018-03-10 MED ORDER — KETOROLAC TROMETHAMINE 30 MG/ML IJ SOLN
30.0000 mg | Freq: Four times a day (QID) | INTRAMUSCULAR | Status: AC
  Administered 2018-03-10: 30 mg via INTRAVENOUS
  Filled 2018-03-10: qty 1

## 2018-03-10 MED ORDER — CEFAZOLIN SODIUM-DEXTROSE 2-4 GM/100ML-% IV SOLN
INTRAVENOUS | Status: AC
  Filled 2018-03-10: qty 100

## 2018-03-10 MED ORDER — 0.9 % SODIUM CHLORIDE (POUR BTL) OPTIME
TOPICAL | Status: DC | PRN
  Administered 2018-03-10: 1000 mL

## 2018-03-10 MED ORDER — CEFAZOLIN SODIUM-DEXTROSE 2-4 GM/100ML-% IV SOLN
2.0000 g | INTRAVENOUS | Status: AC
  Administered 2018-03-10: 2 g via INTRAVENOUS

## 2018-03-10 MED ORDER — SUCCINYLCHOLINE CHLORIDE 20 MG/ML IJ SOLN
INTRAMUSCULAR | Status: DC | PRN
  Administered 2018-03-10: 150 mg via INTRAVENOUS

## 2018-03-10 MED ORDER — HYDROMORPHONE HCL 1 MG/ML IJ SOLN
1.0000 mg | INTRAMUSCULAR | Status: DC | PRN
  Administered 2018-03-10 – 2018-03-11 (×5): 1 mg via INTRAVENOUS
  Filled 2018-03-10 (×6): qty 1

## 2018-03-10 MED ORDER — ACETAMINOPHEN 650 MG RE SUPP: 650.0000 mg | Freq: Four times a day (QID) | RECTAL | Status: DC | PRN

## 2018-03-10 MED ORDER — ROCURONIUM BROMIDE 100 MG/10ML IV SOLN
INTRAVENOUS | Status: DC | PRN
  Administered 2018-03-10: 35 mg via INTRAVENOUS
  Administered 2018-03-10: 5 mg via INTRAVENOUS

## 2018-03-10 MED ORDER — SUGAMMADEX SODIUM 200 MG/2ML IV SOLN
INTRAVENOUS | Status: AC
  Filled 2018-03-10: qty 2

## 2018-03-10 MED ORDER — LACTATED RINGERS IV SOLN
INTRAVENOUS | Status: DC
  Administered 2018-03-11: 03:00:00 via INTRAVENOUS

## 2018-03-10 SURGICAL SUPPLY — 54 items
ADH SKN CLS APL DERMABOND .7 (GAUZE/BANDAGES/DRESSINGS) ×1
APPLIER CLIP 9.375 SM OPEN (CLIP) ×6
APR CLP SM 9.3 20 MLT OPN (CLIP) ×3
ATTRACTOMAT 16X20 MAGNETIC DRP (DRAPES) ×2 IMPLANT
BLADE SURG 15 STRL LF DISP TIS (BLADE) ×1 IMPLANT
BLADE SURG 15 STRL SS (BLADE) ×2
BLADE SURG SZ10 CARB STEEL (BLADE) ×2 IMPLANT
CHLORAPREP W/TINT 10.5 ML (MISCELLANEOUS) ×2 IMPLANT
CLIP APPLIE 9.375 SM OPEN (CLIP) ×1 IMPLANT
CLOTH BEACON ORANGE TIMEOUT ST (SAFETY) ×2 IMPLANT
COVER LIGHT HANDLE STERIS (MISCELLANEOUS) ×4 IMPLANT
DERMABOND ADVANCED (GAUZE/BANDAGES/DRESSINGS) ×1
DERMABOND ADVANCED .7 DNX12 (GAUZE/BANDAGES/DRESSINGS) ×1 IMPLANT
DRAPE LAPAROTOMY 77X122 PED (DRAPES) ×2 IMPLANT
DRAPE PROXIMA HALF (DRAPES) ×4 IMPLANT
ELECT NDL TIP 2.8 STRL (NEEDLE) ×1 IMPLANT
ELECT NEEDLE TIP 2.8 STRL (NEEDLE) ×2 IMPLANT
ELECT REM PT RETURN 9FT ADLT (ELECTROSURGICAL) ×2
ELECTRODE REM PT RTRN 9FT ADLT (ELECTROSURGICAL) ×1 IMPLANT
GAUZE SPONGE 4X4 16PLY XRAY LF (GAUZE/BANDAGES/DRESSINGS) ×4 IMPLANT
GLOVE BIOGEL PI IND STRL 7.0 (GLOVE) ×2 IMPLANT
GLOVE BIOGEL PI INDICATOR 7.0 (GLOVE) ×2
GLOVE SURG SS PI 7.5 STRL IVOR (GLOVE) ×3 IMPLANT
GOWN STRL REUS W/ TWL LRG LVL3 (GOWN DISPOSABLE) ×2 IMPLANT
GOWN STRL REUS W/TWL LRG LVL3 (GOWN DISPOSABLE) ×6 IMPLANT
HEMOSTAT SURGICEL 4X8 (HEMOSTASIS) ×2 IMPLANT
KIT BLADEGUARD II DBL (SET/KITS/TRAYS/PACK) ×2 IMPLANT
KIT TURNOVER KIT A (KITS) ×2 IMPLANT
MANIFOLD NEPTUNE II (INSTRUMENTS) ×2 IMPLANT
MARKER SKIN DUAL TIP RULER LAB (MISCELLANEOUS) ×2 IMPLANT
NEEDLE HYPO 22GX1.5 SAFETY (NEEDLE) ×2 IMPLANT
NS IRRIG 1000ML POUR BTL (IV SOLUTION) ×2 IMPLANT
PACK BASIC III (CUSTOM PROCEDURE TRAY) ×2
PACK SRG BSC III STRL LF ECLPS (CUSTOM PROCEDURE TRAY) ×1 IMPLANT
PAD ARMBOARD 7.5X6 YLW CONV (MISCELLANEOUS) ×2 IMPLANT
PENCIL HANDSWITCHING (ELECTRODE) ×2 IMPLANT
SET BASIN LINEN APH (SET/KITS/TRAYS/PACK) ×2 IMPLANT
SHEARS HARMONIC 9CM CVD (BLADE) ×2 IMPLANT
SPONGE DRAIN TRACH 4X4 STRL 2S (GAUZE/BANDAGES/DRESSINGS) ×2 IMPLANT
SPONGE INTESTINAL PEANUT (DISPOSABLE) ×9 IMPLANT
STAPLER VISISTAT 35W (STAPLE) ×2 IMPLANT
SUT ETHILON 4 0 PS 2 18 (SUTURE) ×2 IMPLANT
SUT MNCRL AB 4-0 PS2 18 (SUTURE) ×2 IMPLANT
SUT SILK 2 0 (SUTURE) ×2
SUT SILK 2-0 18XBRD TIE 12 (SUTURE) ×1 IMPLANT
SUT SILK 3 0 (SUTURE) ×2
SUT SILK 3-0 18XBRD TIE 12 (SUTURE) ×1 IMPLANT
SUT VIC AB 2-0 CT2 27 (SUTURE) ×2 IMPLANT
SUT VIC AB 3-0 SH 27 (SUTURE) ×4
SUT VIC AB 3-0 SH 27X BRD (SUTURE) ×1 IMPLANT
SYR 20CC LL (SYRINGE) ×2 IMPLANT
SYSTEM CHEST DRAIN TLS 7FR (DRAIN) ×2 IMPLANT
TOWEL OR 17X26 4PK STRL BLUE (TOWEL DISPOSABLE) ×2 IMPLANT
YANKAUER SUCT BULB TIP 10FT TU (MISCELLANEOUS) ×2 IMPLANT

## 2018-03-10 NOTE — Progress Notes (Signed)
Pt on home CPAP unit when I checked on her CPAP setting 8cm. spo2 92% rt will continue to monitor. Pt in no distress

## 2018-03-10 NOTE — Op Note (Signed)
Patient:  Rebecca Scott  DOB:  05-16-1956  MRN:  [id number]   Preop Diagnosis: Multinodular goiter  Postop Diagnosis: Same  Procedure: Total thyroidectomy  Surgeon: Aviva Signs, MD  Assistant: Blake Divine, MD  Anes: General endotracheal  Indications: Patient is a 62 year old white female who has been followed recently for multinodular goiter.  She has started having compression symptoms.  Recent fine-needle aspiration of the right lobe was indeterminate follicular tissue.  Patient now comes to the operating room for total thyroidectomy.  The risks and benefits of the procedure including bleeding, infection, nerve injury, and the possibility of malignancy were fully explained to the patient, who gave informed consent.  Procedure note: The patient was placed in supine position.  After induction of general endotracheal anesthesia, the neck was prepped and draped using usual sterile technique with ChloraPrep.  Surgical site confirmation was performed.  A transverse incision was made just above the jugular notch.  The platysma was divided using Bovie electrocautery.  The strap muscle was divided longitudinally.  The left lobe of thyroid gland was adherent to the surrounding soft tissue.  It was very vascular, but not significantly enlarged.  Care was taken to avoid the recurrent left laryngeal nerve.  The middle thyroidal artery and vein, inferior thyroidal artery, and the suspensory ligament of Gwenlyn Found were all identified and ligated using small clips.  The left lobe was nodular and the anatomy was complicated by multiple vessels being present.  We did dissect along the thyroid gland, trying to avoid any parathyroid tissue.  The dissection was taken over to the trachea and the left lobe and isthmus were removed without difficulty and sent to pathology for further examination.  The right lobe was significantly enlarged with multiple nodules present.  Again, there was very vascular.  The  suspensory ligament of Gwenlyn Found was divided using the harmonic scalpel.  Any vessels leading into the thyroid gland were ligated and divided using small clips.  Again, we tried to avoid any injury to the parathyroid gland.  Inferiorly, it appeared that the right inferior parathyroid gland was adherent to a nodule off the inferior lobe of the right thyroid.  This was carefully and bluntly dissected free.  The right lobe of the thyroid gland was removed and sent to pathology for further examination.  Care was taken to avoid the right recurrent laryngeal nerve.  Arista and Surgicel were placed in both thyroid beds.  A #5 round THC drain was then placed and brought out through separate stab wound to the right of the incision.  It was secured to the skin level using a 4-0 nylon suture.  The strap muscle could not be reapproximated as it has been attenuated and inflamed.  The platysma was reapproximated using 3-0 Vicryl interrupted sutures.  Exparel was instilled into the surrounding wound.  The skin was closed using a 4-0 Monocryl subcuticular suture.  Dermabond was applied.  All tape needle counts were correct at the end of the procedure.  The patient was extubated in the operating room and transferred to PACU in stable condition.  Patient was able to phonate the letter E without difficulty.  Dr. Constance Haw assisted throughout the case.  Complications: None  EBL: 75 cc  Specimen: Left thyroid lobe and isthmus, right thyroid lobe  Drains: #5 round THC drain to thyroid bed

## 2018-03-10 NOTE — Progress Notes (Signed)
Pt c/o right eye irritation/pain.  Pt's son administered lubricating eyedrops. Warm compresses applied to eye.

## 2018-03-10 NOTE — Interval H&P Note (Signed)
History and Physical Interval Note:  03/10/2018 7:10 AM  Rebecca Scott  has presented today for surgery, with the diagnosis of multinodular goiter  The various methods of treatment have been discussed with the patient and family. After consideration of risks, benefits and other options for treatment, the patient has consented to  Procedure(s): TOTAL THYROIDECTOMY (N/A) as a surgical intervention .  The patient's history has been reviewed, patient examined, no change in status, stable for surgery.  I have reviewed the patient's chart and labs.  Questions were answered to the patient's satisfaction.     Aviva Signs

## 2018-03-10 NOTE — Transfer of Care (Signed)
Immediate Anesthesia Transfer of Care Note  Patient: Rebecca Scott  Procedure(s) Performed: TOTAL THYROIDECTOMY (N/A Neck)  Patient Location: PACU  Anesthesia Type:General  Level of Consciousness: awake and patient cooperative  Airway & Oxygen Therapy: Patient Spontanous Breathing and Patient connected to tracheostomy mask oxygen  Post-op Assessment: Report given to RN, Post -op Vital signs reviewed and stable and Patient moving all extremities  Post vital signs: Reviewed and stable  Last Vitals:  Vitals Value Taken Time  BP    Temp    Pulse 89 03/10/2018 10:10 AM  Resp    SpO2 98 % 03/10/2018 10:10 AM  Vitals shown include unvalidated device data.  Last Pain:  Vitals:   03/10/18 0649  TempSrc: (P) Oral  PainSc: (P) 4       Patients Stated Pain Goal: (P) 6 (39/[id number])  Complications: No apparent anesthesia complications

## 2018-03-10 NOTE — Anesthesia Procedure Notes (Signed)
Procedure Name: Intubation Date/Time: 03/10/2018 7:49 AM Performed by: Charmaine Downs, CRNA Pre-anesthesia Checklist: Patient identified, Patient being monitored, Timeout performed, Emergency Drugs available and Suction available Patient Re-evaluated:Patient Re-evaluated prior to induction Oxygen Delivery Method: Circle System Utilized Preoxygenation: Pre-oxygenation with 100% oxygen Induction Type: IV induction Ventilation: Mask ventilation without difficulty Laryngoscope Size: Mac and 3 Grade View: Grade II Tube type: Oral Tube size: 7.0 mm Number of attempts: 1 Airway Equipment and Method: stylet Placement Confirmation: ETT inserted through vocal cords under direct vision,  positive ETCO2 and breath sounds checked- equal and bilateral Secured at: 22 cm Tube secured with: Tape Dental Injury: Teeth and Oropharynx as per pre-operative assessment

## 2018-03-10 NOTE — Anesthesia Postprocedure Evaluation (Signed)
Anesthesia Post Note  Patient: Rebecca Scott  Procedure(s) Performed: TOTAL THYROIDECTOMY (N/A Neck)  Patient location during evaluation: PACU Anesthesia Type: General Level of consciousness: awake and patient cooperative Pain management: pain level controlled Vital Signs Assessment: post-procedure vital signs reviewed and stable Respiratory status: spontaneous breathing, nonlabored ventilation, respiratory function stable and patient connected to tracheostomy mask oxygen Cardiovascular status: blood pressure returned to baseline Postop Assessment: no apparent nausea or vomiting Anesthetic complications: no     Last Vitals:  Vitals:   03/10/18 1008 03/10/18 1015  BP: (!) 146/78 140/76  Pulse: 87 87  Resp: 20 (!) 22  Temp: 36.6 C   SpO2: 99% 98%    Last Pain:  Vitals:   03/10/18 1008  TempSrc:   PainSc: 0-No pain                 Eliah Marquard J

## 2018-03-10 NOTE — Progress Notes (Signed)
Patient is on hold for bed placement, condition stable, patient pronounces the letter " E"  Well.

## 2018-03-10 NOTE — Anesthesia Preprocedure Evaluation (Signed)
Anesthesia Evaluation  Patient identified by MRN, date of birth, ID band Patient awake    Reviewed: Allergy & Precautions, H&P , NPO status , Patient's Chart, lab work & pertinent test results, reviewed documented beta blocker date and time   Airway Mallampati: II  TM Distance: >3 FB Neck ROM: full    Dental no notable dental hx. (+) Teeth Intact, Dental Advidsory Given   Pulmonary neg pulmonary ROS, asthma , sleep apnea and Continuous Positive Airway Pressure Ventilation ,    Pulmonary exam normal breath sounds clear to auscultation       Cardiovascular Exercise Tolerance: Good hypertension, + Peripheral Vascular Disease  negative cardio ROS   Rhythm:regular Rate:Normal     Neuro/Psych PSYCHIATRIC DISORDERS Depression negative neurological ROS  negative psych ROS   GI/Hepatic negative GI ROS, Neg liver ROS, GERD  ,  Endo/Other  negative endocrine ROSdiabetes, Type 2  Renal/GU Renal diseasenegative Renal ROS  negative genitourinary   Musculoskeletal   Abdominal   Peds  Hematology negative hematology ROS (+) anemia ,   Anesthesia Other Findings Multinodular goiter.  Notes the sensation of pressure on neck  Eval and find to tracheal deviation, Class II a/w in setting of OSA, Obesity S/p partial nephrectomy 9/18   Reproductive/Obstetrics negative OB ROS                             Anesthesia Physical Anesthesia Plan  ASA: III  Anesthesia Plan: General ETT   Post-op Pain Management:    Induction:   PONV Risk Score and Plan:   Airway Management Planned:   Additional Equipment:   Intra-op Plan:   Post-operative Plan:   Informed Consent: I have reviewed the patients History and Physical, chart, labs and discussed the procedure including the risks, benefits and alternatives for the proposed anesthesia with the patient or authorized representative who has indicated his/her  understanding and acceptance.   Dental Advisory Given  Plan Discussed with: CRNA and Anesthesiologist  Anesthesia Plan Comments:         Anesthesia Quick Evaluation

## 2018-03-11 ENCOUNTER — Ambulatory Visit: Payer: 59 | Admitting: Family Medicine

## 2018-03-11 ENCOUNTER — Ambulatory Visit: Payer: 59 | Admitting: General Surgery

## 2018-03-11 ENCOUNTER — Encounter (HOSPITAL_COMMUNITY): Payer: Self-pay | Admitting: General Surgery

## 2018-03-11 DIAGNOSIS — C73 Malignant neoplasm of thyroid gland: Secondary | ICD-10-CM | POA: Diagnosis not present

## 2018-03-11 LAB — CBC
HEMATOCRIT: 33 % — AB (ref 36.0–46.0)
HEMOGLOBIN: 10.2 g/dL — AB (ref 12.0–15.0)
MCH: 27.6 pg (ref 26.0–34.0)
MCHC: 30.9 g/dL (ref 30.0–36.0)
MCV: 89.2 fL (ref 78.0–100.0)
Platelets: 148 10*3/uL — ABNORMAL LOW (ref 150–400)
RBC: 3.7 MIL/uL — AB (ref 3.87–5.11)
RDW: 15.8 % — ABNORMAL HIGH (ref 11.5–15.5)
WBC: 10 10*3/uL (ref 4.0–10.5)

## 2018-03-11 LAB — COMPREHENSIVE METABOLIC PANEL
ALT: 21 U/L (ref 0–44)
AST: 19 U/L (ref 15–41)
Albumin: 3.3 g/dL — ABNORMAL LOW (ref 3.5–5.0)
Alkaline Phosphatase: 78 U/L (ref 38–126)
Anion gap: 5 (ref 5–15)
BILIRUBIN TOTAL: 0.8 mg/dL (ref 0.3–1.2)
BUN: 15 mg/dL (ref 6–20)
CALCIUM: 9.1 mg/dL (ref 8.9–10.3)
CO2: 31 mmol/L (ref 22–32)
CREATININE: 0.72 mg/dL (ref 0.44–1.00)
Chloride: 103 mmol/L (ref 98–111)
Glucose, Bld: 98 mg/dL (ref 70–99)
Potassium: 4 mmol/L (ref 3.5–5.1)
Sodium: 139 mmol/L (ref 135–145)
TOTAL PROTEIN: 6.2 g/dL — AB (ref 6.5–8.1)

## 2018-03-11 MED ORDER — CALCIUM CARBONATE 1250 (500 CA) MG PO TABS: 1.0000 | ORAL_TABLET | Freq: Two times a day (BID) | ORAL | 1 refills | Status: DC

## 2018-03-11 MED ORDER — LEVOTHYROXINE SODIUM 100 MCG PO TABS: 100.0000 ug | ORAL_TABLET | Freq: Every day | ORAL | 1 refills | Status: DC

## 2018-03-11 MED ORDER — HYDROCODONE-ACETAMINOPHEN 5-325 MG PO TABS: 1.0000 | ORAL_TABLET | ORAL | 0 refills | Status: DC | PRN

## 2018-03-11 NOTE — Discharge Summary (Signed)
Physician Discharge Summary  Patient ID: Rebecca Scott MRN: [id number] DOB/AGE: July 13, 1956 62 y.o.  Admit date: 03/10/2018 Discharge date: 03/11/2018  Admission Diagnoses: Multinodular goiter  Discharge Diagnoses: Same Active Problems:   S/P total thyroidectomy CPAP usage  Discharged Condition: good  Hospital Course: Patient is a 62 year old white female who was referred to my care for a total thyroidectomy due to compressive symptoms from my multinodular goiter.  She underwent total thyroidectomy on 03/10/2018.  She tolerated the procedure well.  Her postoperative course was unremarkable.  She was able to phonate the letter E without difficulty.  Her calcium levels remained within normal limits.  The patient is being discharged home on 03/11/2018 in good and improving condition.  Her small drain was removed prior to discharge.  Treatments: surgery: Total thyroidectomy on 03/10/2018  Discharge Exam: Blood pressure 125/74, pulse 60, temperature 97.7 F (36.5 C), temperature source Oral, resp. rate 18, height 5\' 3"  (1.6 m), weight 206 lb (93.4 kg), SpO2 95 %. General appearance: alert, cooperative and no distress Neck: Neck incision healing well.  No hematoma present.  #5 round drain removed. Resp: clear to auscultation bilaterally Cardio: regular rate and rhythm, S1, S2 normal, no murmur, click, rub or gallop  Disposition: Discharge disposition: 01-Home or Self Care       Discharge Instructions    Diet - low sodium heart healthy   Complete by:  As directed    Increase activity slowly   Complete by:  As directed      Allergies as of 03/11/2018      Reactions   Iodinated Diagnostic Agents Hives, Itching   Isovue [iopamidol] Itching   And hives    Nitrofurantoin Hives   Other Hives, Itching, Rash, Other (See Comments)   Use Paper tape ONLY   Sulfa Antibiotics Hives   Adhesive [tape] Rash, Other (See Comments)   Use Paper tape ONLY   Mango Flavor Swelling, Rash   Tongue  swelling, feels like throat is closing in on her       Medication List    TAKE these medications   baclofen 10 MG tablet Commonly known as:  LIORESAL Take 10 mg by mouth at bedtime.   calcium carbonate 1250 (500 Ca) MG tablet Commonly known as:  OS-CAL - dosed in mg of elemental calcium Take 1 tablet (500 mg of elemental calcium total) by mouth 2 (two) times daily with a meal.   DULoxetine 20 MG capsule Commonly known as:  CYMBALTA Take 20 mg by mouth 2 (two) times daily.   hydrochlorothiazide 25 MG tablet Commonly known as:  HYDRODIURIL Take 1 tablet (25 mg total) by mouth daily.   HYDROcodone-acetaminophen 5-325 MG tablet Commonly known as:  NORCO/VICODIN Take 1 tablet by mouth every 4 (four) hours as needed for moderate pain.   levothyroxine 100 MCG tablet Commonly known as:  SYNTHROID, LEVOTHROID Take 1 tablet (100 mcg total) by mouth daily before breakfast. Start taking on:  03/12/2018   lidocaine 5 % Commonly known as:  LIDODERM Place 1 patch onto the skin daily. Remove & Discard patch within 12 hours or as directed by MD What changed:    when to take this  reasons to take this  additional instructions   losartan 100 MG tablet Commonly known as:  COZAAR Take 1 tablet (100 mg total) by mouth daily.   ondansetron 4 MG disintegrating tablet Commonly known as:  ZOFRAN ODT Take 1 tablet (4 mg total) every 8 (eight) hours as needed  by mouth for nausea or vomiting. 4mg  ODT q4 hours prn nausea/vomit   pantoprazole 40 MG tablet Commonly known as:  PROTONIX Take 40 mg by mouth 2 (two) times daily.   potassium chloride SA 20 MEQ tablet Commonly known as:  K-DUR,KLOR-CON Take 20 mEq by mouth daily.   rosuvastatin 5 MG tablet Commonly known as:  CRESTOR Take 5 mg by mouth daily.      Follow-up Information    Aviva Signs, MD. Schedule an appointment as soon as possible for a visit on 03/20/2018.   Specialty:  General Surgery Contact information: [id number]  [place] [address] [phone]           Signed: Aviva Signs 03/11/2018, 9:35 AM

## 2018-03-11 NOTE — Progress Notes (Signed)
IV discontinued,catheter intact. Patient discharged with instructions given on medications,and follow up visits,patient verbalized understanding. Prescriptions sent to Pharmacy of choice documented on AVS. Accompanied by staff to an awaiting vehicle.

## 2018-03-11 NOTE — Anesthesia Postprocedure Evaluation (Signed)
Anesthesia Post Note  Patient: Rebecca Scott  Procedure(s) Performed: TOTAL THYROIDECTOMY (N/A Neck)  Patient location during evaluation: Nursing Unit Anesthesia Type: General Level of consciousness: awake and patient cooperative Pain management: pain level controlled Vital Signs Assessment: post-procedure vital signs reviewed and stable Respiratory status: spontaneous breathing, nonlabored ventilation and respiratory function stable Cardiovascular status: blood pressure returned to baseline Postop Assessment: no apparent nausea or vomiting Anesthetic complications: no     Last Vitals:  Vitals:   03/10/18 2147 03/11/18 0614  BP: 116/65 125/74  Pulse: (!) 53 60  Resp: 16 18  Temp: 36.6 C 36.5 C  SpO2: 98% 95%    Last Pain:  Vitals:   03/11/18 0725  TempSrc:   PainSc: 0-No pain                 Merlyn Bollen J

## 2018-03-11 NOTE — Addendum Note (Signed)
Addendum  created 03/11/18 1618 by Charmaine Downs, CRNA   Sign clinical note

## 2018-03-20 ENCOUNTER — Encounter: Payer: Self-pay | Admitting: General Surgery

## 2018-03-20 ENCOUNTER — Ambulatory Visit (INDEPENDENT_AMBULATORY_CARE_PROVIDER_SITE_OTHER): Payer: Self-pay | Admitting: General Surgery

## 2018-03-20 ENCOUNTER — Ambulatory Visit: Payer: 59 | Admitting: General Surgery

## 2018-03-20 VITALS — BP 135/76 | HR 78 | Temp 97.5°F | Resp 16 | Wt 202.0 lb

## 2018-03-20 DIAGNOSIS — Z09 Encounter for follow-up examination after completed treatment for conditions other than malignant neoplasm: Secondary | ICD-10-CM

## 2018-03-20 MED ORDER — LEVOTHYROXINE SODIUM 100 MCG PO TABS: 100.0000 ug | ORAL_TABLET | Freq: Every day | ORAL | 1 refills | Status: DC

## 2018-03-20 NOTE — Progress Notes (Signed)
Subjective:     Rebecca Scott  Status post total thyroidectomy.  Patient doing well.  Her dysphasia is improving.  She has no voice changes.  No muscle spasms noted.  She is pleased that her neck is smaller than it was before. Objective:    BP 135/76 (BP Location: Left Arm, Patient Position: Sitting, Cuff Size: Large)   Pulse 78   Temp (!) 97.5 F (36.4 C) (Temporal)   Resp 16   Wt 202 lb (91.6 kg)   BMI 35.78 kg/m   General:  alert, cooperative and no distress  Neck incision healing well.  No swelling noted. 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  Patient is aware of the diagnosis.     Assessment:    Doing well postoperatively.    Plan:   Patient has follow-up with Dr. Dorris Fetch in September of this year.  She has been given information about radioactive iodine therapy.  Her Synthroid has been reordered.  Follow-up as needed.

## 2018-03-20 NOTE — Patient Instructions (Signed)
Radioactive Iodine Treatment for Thyroid Cancer Radioactive iodine treatment is a treatment for thyroid cancer. The treatment involves swallowing a substance called radioactive iodine, or radioiodine. The substance is absorbed by the thyroid gland and destroys cancerous tissue. Tell a health care provider about:  Any allergies you have.  All medicines you are taking, including vitamins, herbs, eye drops, creams, and over-the-counter medicines.  Whether you are pregnant or may be pregnant.  Any surgeries you have had.  Any medical conditions you have.  Any blood disorders you have.  Whether you are breastfeeding. What are the risks? Generally, this is a safe procedure. However, problems may occur, including:  Nausea and vomiting.  Dry mouth and dry eyes.  Swelling, pain, or tenderness in the neck.  Swelling of the glands that make saliva (salivary glands).  Changes in taste.  Increased risk for leukemia or other cancers.  Lower sperm count or infertility in men.  Irregular menstrual periods in women.  What happens before the procedure? Eating and drinking  Follow instructions from your health care provider about eating or drinking restrictions.  Follow the low-iodine diet as told by your health care provider. Check ingredients on packaged foods and beverages because there are foods that you will need to avoid while on the low-iodine diet: ? Avoid iodized table salt and foods that have iodized salt. ? Avoid seafood, seaweed, soybeans, and soy products. ? Avoid dairy products and eggs. ? Avoid the food dye Red No. 3 because it has iodine. Tests and exams  See your health care provider for any needed tests and exams. Before your treatment: ? You will have tests to check your thyroid hormone levels. ? You will take a small dose of radioactive iodine and have a body scan. The dose will be much smaller than your normal treatment dose. The body scan will show if your body is  absorbing the iodine. ? You may get injections of thyroid-stimulating hormone to help your body absorb iodine. General instructions  If you are breastfeeding, ask your health care provider when to stop. Radioactive iodine can pass into your milk.  Ask your health care provider about changing or stopping your regular medicines. You may have to stop taking your thyroid hormone medicine or other medicines. What happens during the procedure?  You will have a body scan to check for cancerous cells in your thyroid.  You may get medicine to prevent nausea or vomiting.  You will go to an isolated room with lead-lined walls. The room will protect others from the radioactive treatment that you will receive.  You will take the radioactive iodine in liquid or pill form. You may have water to swallow the pills.  You will have to stay in the isolated room with lead-lined walls for 2 hours. What happens after the procedure?  You will have a body scan to check if the radioactive iodine is being absorbed by your thyroid.  Do not eat or drink for 1-2 hours after the procedure as instructed by your health care provider.  Your health care provider will discuss with you whether you need to stay in the hospital a few days.  You can drive home after the procedure if you are alone in the car. Avoid using public transportation to get home after the procedure because the radiation in your body can affect other people. Summary  Radioactive iodine treatment is a treatment for thyroid cancer.  Generally, radioactive iodine treatment is a safe procedure. However, problems may occur.  You will have to stay in the isolated room with lead-lined walls for 2 hours.  Do not eat or drink for 1-2 hours after the procedure.  You can drive home after the procedure if you are alone in the car. Avoid using public transportation to get home after the procedure because the radiation in your body can affect other  people. This information is not intended to replace advice given to you by your health care provider. Make sure you discuss any questions you have with your health care provider. Document Released: 07/30/2016 Document Revised: 07/30/2016 Document Reviewed: 07/30/2016 Elsevier Interactive Patient Education  2018 [employer]. Thyroid Cancer The thyroid is a butterfly-shaped gland in the neck. The thyroid makes hormones that control functions like heart rate, blood pressure, temperature, and weight. Thyroid cancer is an abnormal growth of cells in the thyroid. These cells grow and multiply rapidly and do not die as normal cells do. There are four main types of thyroid cancer:  Papillary cancer. This is the least harmful type of thyroid cancer. It typically affects women of child-bearing age.  Follicular cancer. This type of cancer is the most likely to come back after treatment and spread to other parts of the body.  Medullary cancer. Medullary cancer can be passed down through families. A person who inherits the gene for this cancer is at high risk for developing the cancer.  Anaplastic cancer. This type of thyroid cancer spreads quickly to the windpipe (trachea) and causes breathing problems. This type of cancer is most common in people 61 years of age or older.  What are the causes? The cause of thyroid cancer is not known. What increases the risk? Risk factors for thyroid cancer include:  Radiation exposure or radiation treatments to your head and neck during infancy or childhood.  Having an enlarged thyroid.  Having a family history of thyroid disease or inheriting family conditions.  Being female.  Being Asian.  What are the signs or symptoms? Symptoms of thyroid cancer may include:  Larger-than-normal thyroid gland.  Lumps or swelling in your neck.  Hoarseness or a change in your voice.  A cough.  Coughing up blood.  Difficulty swallowing.  Shortness of  breath.  How is this diagnosed? Your health care provider will examine your thyroid during a physical exam. He or she may order tests such as:  An imaging study using sound waves and a computer (ultrasound).  Blood tests.  A tissue sample test (biopsy).  Other tests may be ordered to see if your cancer has spread. These tests may include CT, MRI, or PET scans. Your health care provider may see if you have any genetic changes that may put you at risk for other types of cancer. How is this treated? Most thyroid cancers are treated with a surgery to remove most or all of the thyroid gland (thyroidectomy). In some cases, treatment also involves removing the lymph nodes in the neck that are close to the thyroid. After surgery, you may have:  Thyroid hormone therapy. The therapy is done to: ? Replace the hormone in the body that is normally made by the thyroid. ? Suppress a thyroid-stimulating hormone (TSH) that activates the thyroid and makes any remaining cancer cells grow.  Radioactive iodine treatment. This treatment is often used to destroy any remaining thyroid tissue and cancerous tissue that could not be seen and was not removed during the thyroidectomy. This treatment may also be used to treat thyroid cancer that has spread to  other parts of the body or has recurred.  External radiation. This is typically done to treat thyroid cancer that has spread to the bones.  Chemotherapy treatment. This is medicine that kills cancer cells and prevents them from growing.  Alcohol ablation. This kills cancer cells by injecting alcohol into the cells.  Biologic therapy or immunotherapy. This uses the body's own immune system to fight cancer cells.  Follow these instructions at home:  Take medicines only as directed by your health care provider.  Keep all follow-up visits as directed by your health care provider. This is important. Contact a health care provider if:  You feel nauseous or you  vomit.  You have diarrhea.  You have a rash.  You have problems with urinating, such as: ? Having a burning sensation. ? Needing to urinate more often than usual. ? Pain or difficulty urinating. ? Having blood in your urine.  You have a new cough.  You have symptoms of too much thyroid hormone, such as: ? Nervousness or anxiety. ? Unintentional weight loss. ? Sweating. ? Difficulty sleeping. ? Hair loss. ? Heart palpitations. ? Frequent bowel movements.  You have symptoms of too little thyroid hormone, such as: ? Fatigue. ? Puffiness in your face, hands, or feet. ? Unintentional weight gain. ? Feeling cold. ? Constipation.  You have a fever. Get help right away if:  You have chest pain.  You have shortness of breath.  You suddenly feel too weak or dizzy to stand or walk. This information is not intended to replace advice given to you by your health care provider. Make sure you discuss any questions you have with your health care provider. Document Released: 07/20/2004 Document Revised: 04/29/2016 Document Reviewed: 01/28/2014 Elsevier Interactive Patient Education  2018 [employer].

## 2018-03-26 ENCOUNTER — Ambulatory Visit (INDEPENDENT_AMBULATORY_CARE_PROVIDER_SITE_OTHER): Payer: 59 | Admitting: Family Medicine

## 2018-03-26 ENCOUNTER — Encounter: Payer: Self-pay | Admitting: Family Medicine

## 2018-03-26 VITALS — BP 132/82 | HR 78 | Temp 98.9°F | Ht 63.0 in | Wt 204.0 lb

## 2018-03-26 DIAGNOSIS — Z9889 Other specified postprocedural states: Secondary | ICD-10-CM

## 2018-03-26 DIAGNOSIS — Z0289 Encounter for other administrative examinations: Secondary | ICD-10-CM

## 2018-03-26 DIAGNOSIS — Z85850 Personal history of malignant neoplasm of thyroid: Secondary | ICD-10-CM

## 2018-03-26 DIAGNOSIS — R51 Headache: Secondary | ICD-10-CM | POA: Diagnosis not present

## 2018-03-26 DIAGNOSIS — E890 Postprocedural hypothyroidism: Secondary | ICD-10-CM

## 2018-03-26 DIAGNOSIS — R519 Headache, unspecified: Secondary | ICD-10-CM

## 2018-03-26 DIAGNOSIS — Z9089 Acquired absence of other organs: Secondary | ICD-10-CM

## 2018-03-26 NOTE — Progress Notes (Signed)
Subjective: CC: headache, s/p thyroidectomy PCP: Janora Norlander, DO WIO:[id number] C Holsonback is a 62 y.o. female presenting to clinic today for:  1. Headache Patient reports that she has been having a headache which radiates from the back of her head to the front of her head.  The longest and most severe episode lasted about 3 days and waxed and waned in severity.  At its most severe, she did note some visual disturbance that she describes as jiggling of her vision.  Denies any complete loss of vision or double vision.  She is felt that she is had some balance changes but she is unsure if it was associated with a headache or occurs outside of the headache.  She reports using ibuprofen and an old hydrocodone with little improvement in symptoms.  She actually reports that having used essential oils seem to resolve the headache.  She did have another headache yesterday but it was much milder and only lasted about an hour.  She does have a past medical history significant for migraine headaches but notes that this does not feel like her typical migraine headache.  Denies any associated nausea, vomiting, sinus symptoms, weakness or numbness and tingling.  No confusion or difficulty forming words.  2.  Papillary thyroid cancer/ RUQ pain Patient with recent thyroidectomy.  Biopsy showed papillary thyroid carcinoma, follicular variant.  She states that she has follow-up with Dr. Dorris Fetch, her endocrinologist for labs soon.  She is currently taking oral calcium.  Reports continued right upper quadrant pain and feels like the rib nerve block is wearing off.  She has had extensive imaging of the abdomen to evaluate this issue further.  She has an appointment coming up with her pain specialist soon.  She also has follow-up with her gastroenterologist who has been working her up for this issue as well.   ROS: Per HPI  Allergies  Allergen Reactions  . Iodinated Diagnostic Agents Hives and Itching  . Isovue  [Iopamidol] Itching    And hives   . Nitrofurantoin Hives  . Other Hives, Itching, Rash and Other (See Comments)    Use Paper tape ONLY  . Sulfa Antibiotics Hives  . Adhesive [Tape] Rash and Other (See Comments)    Use Paper tape ONLY  . Mango Flavor Swelling and Rash    Tongue swelling, feels like throat is closing in on her    Past Medical History:  Diagnosis Date  . Anemia   . Arthritis   . Asthma    seasonal asthma rarely   . Chronic kidney disease    renal mass right kidney   . CPAP (continuous positive airway pressure) dependence   . Diverticulitis   . GERD (gastroesophageal reflux disease)   . Hemorrhoids   . History of kidney stones   . Hyperlipemia   . Hypertension   . Migraine   . Papillary renal cell carcinoma ([place]) 05/2017   s/p partial nephrectomy, right  . PONV (postoperative nausea and vomiting)    patient reports being "slower to wake than average "   . Pre-diabetes    "ive been told i'm borderline"   . Sleep apnea    CPAP use     Current Outpatient Medications:  .  baclofen (LIORESAL) 10 MG tablet, Take 10 mg by mouth at bedtime., Disp: , Rfl:  .  calcium carbonate (OS-CAL - DOSED IN MG OF ELEMENTAL CALCIUM) 1250 (500 Ca) MG tablet, Take 1 tablet (500 mg of elemental calcium total)  by mouth 2 (two) times daily with a meal., Disp: 60 tablet, Rfl: 1 .  calcium-vitamin D (OSCAL WITH D) 250-125 MG-UNIT tablet, Take 1 tablet by mouth daily., Disp: , Rfl:  .  DULoxetine (CYMBALTA) 20 MG capsule, Take 20 mg by mouth 2 (two) times daily., Disp: , Rfl:  .  hydrochlorothiazide (HYDRODIURIL) 25 MG tablet, Take 1 tablet (25 mg total) by mouth daily., Disp: 30 tablet, Rfl: 2 .  HYDROcodone-acetaminophen (NORCO/VICODIN) 5-325 MG tablet, Take 1 tablet by mouth every 4 (four) hours as needed for moderate pain. (Patient not taking: Reported on 03/20/2018), Disp: 40 tablet, Rfl: 0 .  levothyroxine (SYNTHROID, LEVOTHROID) 100 MCG tablet, Take 1 tablet (100 mcg total) by mouth  daily before breakfast., Disp: 60 tablet, Rfl: 1 .  lidocaine (LIDODERM) 5 %, Place 1 patch onto the skin daily. Remove & Discard patch within 12 hours or as directed by MD (Patient taking differently: Place 1 patch onto the skin daily as needed (pain). Remove & Discard patch within 12 hours or as directed by MD), Disp: 30 patch, Rfl: 1 .  losartan (COZAAR) 100 MG tablet, Take 1 tablet (100 mg total) by mouth daily., Disp: 90 tablet, Rfl: 1 .  ondansetron (ZOFRAN ODT) 4 MG disintegrating tablet, Take 1 tablet (4 mg total) every 8 (eight) hours as needed by mouth for nausea or vomiting. 30m ODT q4 hours prn nausea/vomit, Disp: 30 tablet, Rfl: 0 .  pantoprazole (PROTONIX) 40 MG tablet, Take 40 mg by mouth 2 (two) times daily. , Disp: , Rfl:  .  potassium chloride SA (K-DUR,KLOR-CON) 20 MEQ tablet, Take 20 mEq by mouth daily. , Disp: , Rfl:  .  rosuvastatin (CRESTOR) 5 MG tablet, Take 5 mg by mouth daily. , Disp: , Rfl:   Current Facility-Administered Medications:  .  ondansetron (ZOFRAN-ODT) disintegrating tablet 4 mg, 4 mg, Oral, Once, GJanora Norlander DO Social History   Socioeconomic History  . Marital status: Widowed    Spouse name: Not on file  . Number of children: Not on file  . Years of education: Not on file  . Highest education level: Not on file  Occupational History  . Not on file  Social Needs  . Financial resource strain: Not on file  . Food insecurity:    Worry: Not on file    Inability: Not on file  . Transportation needs:    Medical: Not on file    Non-medical: Not on file  Tobacco Use  . Smoking status: Never Smoker  . Smokeless tobacco: Never Used  Substance and Sexual Activity  . Alcohol use: Yes    Comment: rarely   . Drug use: No  . Sexual activity: Not Currently  Lifestyle  . Physical activity:    Days per week: Not on file    Minutes per session: Not on file  . Stress: Not on file  Relationships  . Social connections:    Talks on phone: Not on file     Gets together: Not on file    Attends religious service: Not on file    Active member of club or organization: Not on file    Attends meetings of clubs or organizations: Not on file    Relationship status: Not on file  . Intimate partner violence:    Fear of current or ex partner: Not on file    Emotionally abused: Not on file    Physically abused: Not on file    Forced sexual activity:  Not on file  Other Topics Concern  . Not on file  Social History Narrative  . Not on file   Family History  Problem Relation Age of Onset  . COPD Mother   . Diabetes Mother   . Hypertension Mother   . High Cholesterol Mother   . Liver cancer Mother   . COPD Sister   . Hypertension Brother   . High Cholesterol Brother   . Hypertension Brother   . Asthma Son     Objective: Office vital signs reviewed. BP 132/82   Pulse 78   Temp 98.9 F (37.2 C) (Oral)   Ht '5\' 3"'  (1.6 m)   Wt 204 lb (92.5 kg)   BMI 36.14 kg/m   Physical Examination:  General: Awake, alert, obese, No acute distress HEENT: Normal    Neck: No masses palpated. No lymphadenopathy    Ears: Tympanic membranes intact, normal light reflex, no erythema, no bulging    Eyes: PERRLA, extraocular membranes intact, sclera white    Nose: nasal turbinates moist, no nasal discharge    Throat: moist mucus membranes, no erythema, no tonsillar exudate.  Airway is patent Cardio: regular rate and rhythm, S1S2 heard, no murmurs appreciated Pulm: clear to auscultation bilaterally, no wheezes, rhonchi or rales; normal work of breathing on room air Extremities: warm, well perfused, No edema, cyanosis or clubbing; +2 pulses bilaterally MSK: normal gait and normal station Skin: dry; she has a healing surgical incision at the base of her anterior neck that appears to be clean with no evidence of secondary infection.  She has surgical glue still in place.  No appreciable hematoma or significant fluid collection. Neuro: 5/5 UE and LE Strength  and light touch sensation grossly intact, cranial nerves II through XII grossly intact.  Alert and oriented x3.  Upper extremity and lower extremity cerebellar testing normal.  Assessment/ Plan: 62 y.o. female   1. Acute nonintractable headache, unspecified headache type Likely tension in nature.  She does have past medical history of migraine headaches.  Will check electrolytes today to make sure we see no imbalance.  She had a normal neurologic exam today so low likelihood to be intracranial process.  However, given her history of cancers we discussed at length signs and symptoms of CVA.  We will continue to follow her closely with low threshold for imaging of the brain should symptoms persist. - CMP14+EGFR  2. S/P thyroidectomy Doing well after thyroidectomy.  Incision site appears to be clean with no evidence of infection. - CMP14+EGFR  3. Hx of papillary thyroid carcinoma Patient to drop off FMLA paperwork.  She is considering applying for disability given multiple medical conditions.  We will support her as able.  Orders Placed This Encounter  Procedures  . [place], [place] [phone]

## 2018-03-26 NOTE — Patient Instructions (Signed)
You had labs performed today.  You will be contacted with the results of the labs once they are available, usually in the next 3 business days for routine lab work.   I will get the [facility] paperwork completed for you as soon as possible.  You will be contacted to pick this up.    General Headache Without Cause A headache is pain or discomfort felt around the head or neck area. There are many causes and types of headaches. In some cases, the cause may not be found. Follow these instructions at home: Managing pain  Take over-the-counter and prescription medicines only as told by your doctor.  Lie down in a dark, quiet room when you have a headache.  If directed, apply ice to the head and neck area: ? Put ice in a plastic bag. ? Place a towel between your skin and the bag. ? Leave the ice on for 20 minutes, 2-3 times per day.  Use a heating pad or hot shower to apply heat to the head and neck area as told by your doctor.  Keep lights dim if bright lights bother you or make your headaches worse. Eating and drinking  Eat meals on a regular schedule.  Lessen how much alcohol you drink.  Lessen how much caffeine you drink, or stop drinking caffeine. General instructions  Keep all follow-up visits as told by your doctor. This is important.  Keep a journal to find out if certain things bring on headaches. For example, write down: ? What you eat and drink. ? How much sleep you get. ? Any change to your diet or medicines.  Relax by getting a massage or doing other relaxing activities.  Lessen stress.  Sit up straight. Do not tighten (tense) your muscles.  Do not use tobacco products. This includes cigarettes, chewing tobacco, or e-cigarettes. If you need help quitting, ask your doctor.  Exercise regularly as told by your doctor.  Get enough sleep. This often means 7-9 hours of sleep. Contact a doctor if:  Your symptoms are not helped by medicine.  You have a headache that  feels different than the other headaches.  You feel sick to your stomach (nauseous) or you throw up (vomit).  You have a fever. Get help right away if:  Your headache becomes really bad.  You keep throwing up.  You have a stiff neck.  You have trouble seeing.  You have trouble speaking.  You have pain in the eye or ear.  Your muscles are weak or you lose muscle control.  You lose your balance or have trouble walking.  You feel like you will pass out (faint) or you pass out.  You have confusion. This information is not intended to replace advice given to you by your health care provider. Make sure you discuss any questions you have with your health care provider. Document Released: 06/05/2008 Document Revised: 02/02/2016 Document Reviewed: 12/20/2014 Elsevier Interactive Patient Education  [employer].

## 2018-03-27 LAB — CMP14+EGFR
A/G RATIO: 1.5 (ref 1.2–2.2)
ALT: 17 IU/L (ref 0–32)
AST: 15 IU/L (ref 0–40)
Albumin: 3.9 g/dL (ref 3.6–4.8)
Alkaline Phosphatase: 104 IU/L (ref 39–117)
BUN/Creatinine Ratio: 12 (ref 12–28)
BUN: 9 mg/dL (ref 8–27)
Bilirubin Total: 0.3 mg/dL (ref 0.0–1.2)
CALCIUM: 9.7 mg/dL (ref 8.7–10.3)
CO2: 24 mmol/L (ref 20–29)
Chloride: 103 mmol/L (ref 96–106)
Creatinine, Ser: 0.77 mg/dL (ref 0.57–1.00)
GFR, EST AFRICAN AMERICAN: 97 mL/min/{1.73_m2} (ref >59)
GFR, EST NON AFRICAN AMERICAN: 84 mL/min/{1.73_m2} (ref >59)
GLOBULIN, TOTAL: 2.6 g/dL (ref 1.5–4.5)
Glucose: 122 mg/dL — ABNORMAL HIGH (ref 65–99)
POTASSIUM: 3.5 mmol/L (ref 3.5–5.2)
SODIUM: 140 mmol/L (ref 134–144)
Total Protein: 6.5 g/dL (ref 6.0–8.5)

## 2018-03-28 ENCOUNTER — Encounter: Payer: Self-pay | Admitting: Family Medicine

## 2018-03-31 ENCOUNTER — Telehealth (HOSPITAL_COMMUNITY): Payer: Self-pay | Admitting: Internal Medicine

## 2018-04-01 ENCOUNTER — Encounter (INDEPENDENT_AMBULATORY_CARE_PROVIDER_SITE_OTHER): Payer: Self-pay | Admitting: Internal Medicine

## 2018-04-01 ENCOUNTER — Ambulatory Visit: Payer: 59 | Admitting: Cardiology

## 2018-04-01 ENCOUNTER — Ambulatory Visit (INDEPENDENT_AMBULATORY_CARE_PROVIDER_SITE_OTHER): Payer: 59 | Admitting: Internal Medicine

## 2018-04-01 VITALS — BP 138/98 | HR 66 | Temp 98.4°F | Resp 18 | Ht 63.0 in | Wt 206.9 lb

## 2018-04-01 DIAGNOSIS — R143 Flatulence: Secondary | ICD-10-CM

## 2018-04-01 DIAGNOSIS — R1011 Right upper quadrant pain: Secondary | ICD-10-CM

## 2018-04-01 DIAGNOSIS — K219 Gastro-esophageal reflux disease without esophagitis: Secondary | ICD-10-CM | POA: Diagnosis not present

## 2018-04-01 DIAGNOSIS — R1013 Epigastric pain: Secondary | ICD-10-CM

## 2018-04-01 DIAGNOSIS — K582 Mixed irritable bowel syndrome: Secondary | ICD-10-CM

## 2018-04-01 MED ORDER — DICYCLOMINE HCL 10 MG PO CAPS: 10.0000 mg | ORAL_CAPSULE | Freq: Three times a day (TID) | ORAL | 2 refills | Status: DC | PRN

## 2018-04-01 MED ORDER — PANTOPRAZOLE SODIUM 40 MG PO TBEC
40.0000 mg | DELAYED_RELEASE_TABLET | Freq: Every day | ORAL | 3 refills | Status: DC
Start: 2018-04-01 — End: 2019-02-23

## 2018-04-01 MED ORDER — METRONIDAZOLE 250 MG PO TABS: 250.0000 mg | ORAL_TABLET | Freq: Three times a day (TID) | ORAL | 0 refills | Status: DC

## 2018-04-01 NOTE — Patient Instructions (Addendum)
Keep daily symptom diary asked to excessive gas and call office with progress report when you finish antibiotic. Gradually increase physical activity i.e. walking as discussed.  Goal is for you to walk 1 to 2 miles at least 4 times a week.

## 2018-04-01 NOTE — Progress Notes (Signed)
Presenting complaint;  Multiple complaints which include right upper quadrant abdominal pain epigastric pain and intractable flatus and irregular bowel movements.  Database and subjective:  Patient is 62 year old Caucasian female who is here for scheduled visit.  I initially saw her in October last year for right subcostal pain.  She underwent extensive evaluation and work-up was negative.  I felt she was having neuropathic pain.  She has a history of renal cell carcinoma for which she had robotic surgery in September last year.  Extensive work-up was negative for metastatic disease.  She did respond to symptomatic therapy.  She states she had a nerve block and pain was completely gone for 1 month.  Now the pain is back. When she had a chest CT because of this pain she was found to have thyroid lesions which were eventually diagnosed to be malignant and she had thyroid surgery few weeks ago. She states she is not feeling well.  She complains of right upper quadrant pain which radiates into her back.  This is a pain that improve for a few weeks after she had nerve block.  She also complains of another pain which she describes as baby kicking.  Lately she is also noted midepigastric pain which comes and goes.  None of these pains has any relationship to her meals her bowels.  She says heartburn is well controlled with therapy.  She remains with irregular bowel movements.  She has diarrhea and/or constipation.  She says she is only had one normal stool in the last 1 year.  Yesterday she had 2 stools and today she already has had 3.  Frequency ranges anywhere from 0 to 6/day.  She feels better when she has multiple bowel movements.  She does not feel well when she does not have a bowel movement.  She also complains of nausea.  She has been taking Zofran on as-needed basis.  Last dose was 1 week ago.  Lately she has noted bloating and she has excessive flatulence.  She says gas is on controllable.  Her  appetite is fair.  She has lost 3 pounds since her last visit. She had a EGD in October 2018 revealing gastritis but no evidence of peptic ulcer disease. She had colonoscopy in February this year and noted to have sigmoid diverticulosis and external hemorrhoids. She says she has taken a leave of absence.  She does not do any exercise or walking on a regular basis.    Current Medications: Outpatient Encounter Medications as of 04/01/2018  Medication Sig  . baclofen (LIORESAL) 10 MG tablet Take 10 mg by mouth at bedtime.  . calcium carbonate (OS-CAL - DOSED IN MG OF ELEMENTAL CALCIUM) 1250 (500 Ca) MG tablet Take 1 tablet (500 mg of elemental calcium total) by mouth 2 (two) times daily with a meal.  . DULoxetine (CYMBALTA) 20 MG capsule Take 20 mg by mouth 2 (two) times daily.  . hydrochlorothiazide (HYDRODIURIL) 25 MG tablet Take 1 tablet (25 mg total) by mouth daily.  Marland Kitchen HYDROcodone-acetaminophen (NORCO/VICODIN) 5-325 MG tablet Take 1 tablet by mouth every 4 (four) hours as needed for moderate pain.  Marland Kitchen levothyroxine (SYNTHROID, LEVOTHROID) 100 MCG tablet Take 1 tablet (100 mcg total) by mouth daily before breakfast.  . losartan (COZAAR) 100 MG tablet Take 100 mg by mouth daily.  . ondansetron (ZOFRAN ODT) 4 MG disintegrating tablet Take 1 tablet (4 mg total) every 8 (eight) hours as needed by mouth for nausea or vomiting. 4mg  ODT q4  hours prn nausea/vomit  . pantoprazole (PROTONIX) 40 MG tablet Take 40 mg by mouth 2 (two) times daily.   . potassium chloride SA (K-DUR,KLOR-CON) 20 MEQ tablet Take 20 mEq by mouth daily.   . rosuvastatin (CRESTOR) 5 MG tablet Take 5 mg by mouth daily.   Marland Kitchen lidocaine (LIDODERM) 5 % Place 1 patch onto the skin daily. Remove & Discard patch within 12 hours or as directed by MD (Patient not taking: Reported on 04/01/2018)  . losartan (COZAAR) 100 MG tablet Take 1 tablet (100 mg total) by mouth daily.   Facility-Administered Encounter Medications as of 04/01/2018   Medication  . ondansetron (ZOFRAN-ODT) disintegrating tablet 4 mg     Objective: Blood pressure (!) 138/98, pulse 66, temperature 98.4 F (36.9 C), temperature source Oral, resp. rate 18, height 5\' 3"  (1.6 m), weight 206 lb 14.4 oz (93.8 kg). Patient is alert and in no acute distress. Conjunctiva is pink. Sclera is nonicteric Oropharyngeal mucosa is normal. No neck masses or thyromegaly noted. Cardiac exam with regular rhythm normal S1 and S2. No murmur or gallop noted. Lungs are clear to auscultation. Abdomenis full.  Bowel sounds are normal.  No bruit noted.  On palpation she has mild midepigastric tenderness most of which appears to be superficial and she also has mild tenderness below the right costal margin.  No hepatosplenomegaly or masses. No LE edema or clubbing noted.  Labs/studies Results:  Ultrasound on 02/14/2018 Absent gallbladder.  Bile duct measured 9 mm which was felt to be normal in a patient with cholecystectomy. She had cyst in left kidney with calcification in the midportion.  She had mild fullness of the right collecting system suggestive of mild hydronephrosis and she had nonobstructive calculus in right kidney. Patient was advised to follow-up with her urologist.  Lab data from 03/11/2018 Bilirubin 0.8, AP 78, AST 19, ALT 21, total protein 6.2 and albumin 3.3 Serum calcium 9.1. Electrolytes normal. Glucose 98, BUN 15 and creatinine 0.72.  Assessment:  #1.  Right upper quadrant abdominal pain.  This pain started last year and she is undergone extensive evaluation with multiple imaging studies and work-up is been negative this pain is suspected to be neuropathic pain.  She will need to follow-up with your pain clinic physician and may be should reconsider nerve block.  Second right upper quadrant pain would appear to be due to IBS which she describes as a baby kicking.  If pain is worse she can take hydrocodone with acetaminophen.  She was given prescription last  year which she has not finished.  #2.  Epigastric pain appears to be wall pain.  She may also have dyspepsia.  She had a EGD last year was negative for peptic ulcer disease.  Gastric biopsy was negative for H. Pylori.  #3.  Excessive flatus.  She may have small intestinal bacterial overgrowth.  If she does not respond to empiric therapy will consider further work-up.  #4.  Chronic GERD.  Heartburn is well controlled with PPI.  Dose could be reduced.   Plan:  Decrease pantoprazole to 40 mg p.o. every morning. Metronidazole 250 mg p.o. 3 times daily for 10 days. Patient will call office with progress report when she finishes antibiotic.   At dicyclomine 10 mg p.o. 3 times daily PRN.  Patient also advised to increase physical activity.  She should consider walking regularly. Office visit in 3 months.

## 2018-04-03 ENCOUNTER — Ambulatory Visit (INDEPENDENT_AMBULATORY_CARE_PROVIDER_SITE_OTHER): Payer: 59 | Admitting: Family Medicine

## 2018-04-03 ENCOUNTER — Encounter: Payer: Self-pay | Admitting: Family Medicine

## 2018-04-03 VITALS — BP 125/69 | HR 59 | Temp 96.9°F | Ht 63.0 in | Wt 206.0 lb

## 2018-04-03 DIAGNOSIS — Z85528 Personal history of other malignant neoplasm of kidney: Secondary | ICD-10-CM | POA: Diagnosis not present

## 2018-04-03 DIAGNOSIS — Z85850 Personal history of malignant neoplasm of thyroid: Secondary | ICD-10-CM

## 2018-04-03 DIAGNOSIS — R29818 Other symptoms and signs involving the nervous system: Secondary | ICD-10-CM

## 2018-04-03 DIAGNOSIS — G4452 New daily persistent headache (NDPH): Secondary | ICD-10-CM

## 2018-04-03 MED ORDER — METHYLPREDNISOLONE ACETATE 80 MG/ML IJ SUSP
80.0000 mg | Freq: Once | INTRAMUSCULAR | Status: AC
  Administered 2018-04-03: 80 mg via INTRAMUSCULAR

## 2018-04-03 MED ORDER — PROMETHAZINE HCL 25 MG/ML IJ SOLN
12.5000 mg | Freq: Once | INTRAMUSCULAR | Status: AC
  Administered 2018-04-03: 12.5 mg via INTRAMUSCULAR

## 2018-04-03 NOTE — Patient Instructions (Signed)
You are given a dose of Depo-Medrol 80 and promethazine 12.5 mg intramuscularly today for your headache.  Because this is a persistent headache and is occurring almost daily and your experiencing some balance issues, I have ordered an MRI of your brain to further evaluate.  You will be contacted with an appointment.  If you develop worsening symptoms or any other worrisome symptoms or signs we discussed, please seek immediate medical attention in the emergency department.  General Headache Without Cause A headache is pain or discomfort felt around the head or neck area. There are many causes and types of headaches. In some cases, the cause may not be found. Follow these instructions at home: Managing pain  Take over-the-counter and prescription medicines only as told by your doctor.  Lie down in a dark, quiet room when you have a headache.  If directed, apply ice to the head and neck area: ? Put ice in a plastic bag. ? Place a towel between your skin and the bag. ? Leave the ice on for 20 minutes, 2-3 times per day.  Use a heating pad or hot shower to apply heat to the head and neck area as told by your doctor.  Keep lights dim if bright lights bother you or make your headaches worse. Eating and drinking  Eat meals on a regular schedule.  Lessen how much alcohol you drink.  Lessen how much caffeine you drink, or stop drinking caffeine. General instructions  Keep all follow-up visits as told by your doctor. This is important.  Keep a journal to find out if certain things bring on headaches. For example, write down: ? What you eat and drink. ? How much sleep you get. ? Any change to your diet or medicines.  Relax by getting a massage or doing other relaxing activities.  Lessen stress.  Sit up straight. Do not tighten (tense) your muscles.  Do not use tobacco products. This includes cigarettes, chewing tobacco, or e-cigarettes. If you need help quitting, ask your  doctor.  Exercise regularly as told by your doctor.  Get enough sleep. This often means 7-9 hours of sleep. Contact a doctor if:  Your symptoms are not helped by medicine.  You have a headache that feels different than the other headaches.  You feel sick to your stomach (nauseous) or you throw up (vomit).  You have a fever. Get help right away if:  Your headache becomes really bad.  You keep throwing up.  You have a stiff neck.  You have trouble seeing.  You have trouble speaking.  You have pain in the eye or ear.  Your muscles are weak or you lose muscle control.  You lose your balance or have trouble walking.  You feel like you will pass out (faint) or you pass out.  You have confusion. This information is not intended to replace advice given to you by your health care provider. Make sure you discuss any questions you have with your health care provider. Document Released: 06/05/2008 Document Revised: 02/02/2016 Document Reviewed: 12/20/2014 Elsevier Interactive Patient Education  [employer].

## 2018-04-03 NOTE — Progress Notes (Signed)
Subjective: CC: headache PCP: Janora Norlander, DO ERD:EYCXKGY Rebecca Scott is a 62 y.o. female presenting to clinic today for:  1. Headache Patient reports onset of 8/10 headache that starts at the back of her head and radiates to the front 2 days ago.  She denies this being the worst headache of her life and states that she has had more severe migraine headaches in the past.  She states that this headache has been constant.  She has used Advil 400 mg every 4-6 hours and BC powders with little relief of headache.  She was actually seen on 03/26/2018 for similar but at that time it had resolved after using essential oils.  She had noticed some jiggling in her vision during those headaches.  There was question as to whether or not she was having balance changes associated with headaches.  History of migraine headaches in the past but she states this does not feel like her migraine headaches.  She does report some sound sensitivity but denies any photophobia, nausea or vomiting.  She does still feel like she sometimes has to hold onto the wall to balance herself.  Past medical history is significant for papillary thyroid cancer, follicular variant and papillary renal cell carcinoma of the right kidney.  She does have a history of sleep apnea but reports consistent use of CPAP.   ROS: Per HPI  Allergies  Allergen Reactions  . Iodinated Diagnostic Agents Hives and Itching  . Isovue [Iopamidol] Itching    And hives   . Nitrofurantoin Hives  . Other Hives, Itching, Rash and Other (See Comments)    Use Paper tape ONLY  . Sulfa Antibiotics Hives  . Adhesive [Tape] Rash and Other (See Comments)    Use Paper tape ONLY  . Mango Flavor Swelling and Rash    Tongue swelling, feels like throat is closing in on her    Past Medical History:  Diagnosis Date  . Anemia   . Arthritis   . Asthma    seasonal asthma rarely   . Chronic kidney disease    renal mass right kidney   . CPAP (continuous positive  airway pressure) dependence   . Diverticulitis   . GERD (gastroesophageal reflux disease)   . Hemorrhoids   . History of kidney stones   . Hyperlipemia   . Hypertension   . Migraine   . Papillary renal cell carcinoma ([place]) 05/2017   s/p partial nephrectomy, right  . PONV (postoperative nausea and vomiting)    patient reports being "slower to wake than average "   . Pre-diabetes    "ive been told i'm borderline"   . Sleep apnea    CPAP use     Current Outpatient Medications:  .  baclofen (LIORESAL) 10 MG tablet, Take 10 mg by mouth at bedtime., Disp: , Rfl:  .  calcium carbonate (OS-CAL - DOSED IN MG OF ELEMENTAL CALCIUM) 1250 (500 Ca) MG tablet, Take 1 tablet (500 mg of elemental calcium total) by mouth 2 (two) times daily with a meal., Disp: 60 tablet, Rfl: 1 .  dicyclomine (BENTYL) 10 MG capsule, Take 1 capsule (10 mg total) by mouth 3 (three) times daily as needed for spasms., Disp: 60 capsule, Rfl: 2 .  DULoxetine (CYMBALTA) 20 MG capsule, Take 20 mg by mouth 2 (two) times daily., Disp: , Rfl:  .  hydrochlorothiazide (HYDRODIURIL) 25 MG tablet, Take 1 tablet (25 mg total) by mouth daily., Disp: 30 tablet, Rfl: 2 .  HYDROcodone-acetaminophen (NORCO/VICODIN) 5-325 MG tablet, Take 1 tablet by mouth every 4 (four) hours as needed for moderate pain., Disp: 40 tablet, Rfl: 0 .  levothyroxine (SYNTHROID, LEVOTHROID) 100 MCG tablet, Take 1 tablet (100 mcg total) by mouth daily before breakfast., Disp: 60 tablet, Rfl: 1 .  metroNIDAZOLE (FLAGYL) 250 MG tablet, Take 1 tablet (250 mg total) by mouth 3 (three) times daily., Disp: 30 tablet, Rfl: 0 .  ondansetron (ZOFRAN ODT) 4 MG disintegrating tablet, Take 1 tablet (4 mg total) every 8 (eight) hours as needed by mouth for nausea or vomiting. 4mg  ODT q4 hours prn nausea/vomit, Disp: 30 tablet, Rfl: 0 .  pantoprazole (PROTONIX) 40 MG tablet, Take 1 tablet (40 mg total) by mouth daily before breakfast., Disp: 90 tablet, Rfl: 3 .  potassium  chloride SA (K-DUR,KLOR-CON) 20 MEQ tablet, Take 20 mEq by mouth daily. , Disp: , Rfl:  .  rosuvastatin (CRESTOR) 5 MG tablet, Take 5 mg by mouth daily. , Disp: , Rfl:  .  losartan (COZAAR) 100 MG tablet, Take 1 tablet (100 mg total) by mouth daily., Disp: 90 tablet, Rfl: 1   Social History   Socioeconomic History  . Marital status: Widowed    Spouse name: Not on file  . Number of children: Not on file  . Years of education: Not on file  . Highest education level: Not on file  Occupational History  . Not on file  Social Needs  . Financial resource strain: Not on file  . Food insecurity:    Worry: Not on file    Inability: Not on file  . Transportation needs:    Medical: Not on file    Non-medical: Not on file  Tobacco Use  . Smoking status: Never Smoker  . Smokeless tobacco: Never Used  Substance and Sexual Activity  . Alcohol use: Yes    Comment: rarely   . Drug use: No  . Sexual activity: Not Currently  Lifestyle  . Physical activity:    Days per week: Not on file    Minutes per session: Not on file  . Stress: Not on file  Relationships  . Social connections:    Talks on phone: Not on file    Gets together: Not on file    Attends religious service: Not on file    Active member of club or organization: Not on file    Attends meetings of clubs or organizations: Not on file    Relationship status: Not on file  . Intimate partner violence:    Fear of current or ex partner: Not on file    Emotionally abused: Not on file    Physically abused: Not on file    Forced sexual activity: Not on file  Other Topics Concern  . Not on file  Social History Narrative  . Not on file   Family History  Problem Relation Age of Onset  . COPD Mother   . Diabetes Mother   . Hypertension Mother   . High Cholesterol Mother   . Liver cancer Mother   . COPD Sister   . Hypertension Brother   . High Cholesterol Brother   . Hypertension Brother   . Asthma Son      Objective: Office vital signs reviewed. BP 125/69   Pulse (!) 59   Temp (!) 96.9 F (36.1 Rebecca) (Oral)   Ht 5\' 3"  (1.6 m)   Wt 206 lb (93.4 kg)   BMI 36.49 kg/m   Physical Examination:  General: Awake, alert, nontoxic appearing. No acute distress HEENT: Normal    Neck: No masses palpated. No lymphadenopathy    Eyes: PERRLA, extraocular membranes intact, sclera white    Nose: nasal turbinates moist, no nasal discharge    Throat: moist mucus membranes, symmetric rise of palate noted. Cardio: regular rate and rhythm, S1S2 heard, no murmurs appreciated Pulm: clear to auscultation bilaterally, no wheezes, rhonchi or rales; normal work of breathing on room air Extremities: warm, well perfused, No edema, cyanosis or clubbing; +2 pulses bilaterally MSK: Normal gait and normal station Skin: dry; intact; no rashes or lesions Neuro: 5/5 upper extremity and lower extremity strength and light touch sensation grossly intact, cranial nerves II through XII grossly intact.  She has normal upper extremity cerebellar testing.  She does have some sway noted with Romberg however she was able to maintain posture.  Alert and oriented x3.  Assessment/ Plan: 62 y.o. female   1. New daily persistent headache Patient is afebrile nontoxic-appearing.  No evidence of infection.  Headache is atypical for her typical migraine headaches.  She was given a dose of Depo-Medrol 80 and promethazine 12.5 mg here in office.  Her physical exam was remarkable for some sway with Romberg but otherwise was normal.  Given history of papillary thyroid carcinoma and renal cell carcinoma, we discussed consideration for imaging of the brain with an MRI.  She was amenable to this.  We will attempt to get this scheduled within the next couple of days.  We discussed that if she develops any worsening symptoms or signs concerning for CVA or aneurysm that she will seek immediate medical attention the emergency department.  She voiced good  understanding.  If MRI brain is unremarkable, may need to consider referral to neurology for further evaluation and management of headaches. - methylPREDNISolone acetate (DEPO-MEDROL) injection 80 mg - promethazine (PHENERGAN) injection 12.5 mg - MR BRAIN WO CONTRAST; Future  2. Hx of papillary thyroid carcinoma - MR BRAIN WO CONTRAST; Future  3. History of renal cell carcinoma - MR BRAIN WO CONTRAST; Future  4. Abnormal Romberg test - MR BRAIN WO CONTRAST; Future   Orders Placed This Encounter  Procedures  . MR BRAIN WO CONTRAST    Standing Status:   Future    Standing Expiration Date:   06/05/2019    Order Specific Question:   What is the patient's sedation requirement?    Answer:   No Sedation    Order Specific Question:   Does the patient have a pacemaker or implanted devices?    Answer:   No    Order Specific Question:   Preferred imaging location?    Answer:   [facility] (table limit-350lbs)    Order Specific Question:   Radiology Contrast Protocol - do NOT remove file path    Answer:   [email or web address]   Meds ordered this encounter  Medications  . methylPREDNISolone acetate (DEPO-MEDROL) injection 80 mg  . promethazine (PHENERGAN) injection 12.5 mg     Janora Norlander, DO Rebecca Scott [phone]

## 2018-04-04 ENCOUNTER — Ambulatory Visit (HOSPITAL_COMMUNITY)
Admission: RE | Admit: 2018-04-04 | Discharge: 2018-04-04 | Disposition: A | Discharge status: Home / Self Care | Payer: 59 | Source: Ambulatory Visit | Attending: Family Medicine | Admitting: Family Medicine

## 2018-04-04 ENCOUNTER — Encounter: Payer: Self-pay | Admitting: Family Medicine

## 2018-04-04 DIAGNOSIS — G4452 New daily persistent headache (NDPH): Secondary | ICD-10-CM | POA: Insufficient documentation

## 2018-04-04 DIAGNOSIS — R29818 Other symptoms and signs involving the nervous system: Secondary | ICD-10-CM

## 2018-04-04 DIAGNOSIS — Z85850 Personal history of malignant neoplasm of thyroid: Secondary | ICD-10-CM | POA: Diagnosis not present

## 2018-04-04 DIAGNOSIS — Z85528 Personal history of other malignant neoplasm of kidney: Secondary | ICD-10-CM | POA: Diagnosis not present

## 2018-04-10 ENCOUNTER — Ambulatory Visit (INDEPENDENT_AMBULATORY_CARE_PROVIDER_SITE_OTHER): Payer: Self-pay | Admitting: General Surgery

## 2018-04-10 ENCOUNTER — Encounter: Payer: Self-pay | Admitting: General Surgery

## 2018-04-10 VITALS — BP 145/84 | HR 64 | Temp 97.8°F | Resp 20 | Wt 207.0 lb

## 2018-04-10 DIAGNOSIS — Z09 Encounter for follow-up examination after completed treatment for conditions other than malignant neoplasm: Secondary | ICD-10-CM

## 2018-04-10 NOTE — Progress Notes (Signed)
Subjective:     Rebecca Scott  Status post total thyroidectomy.  Patient has had some muscle spasms in her neck, a single resolved.  She has been feeling a little jittery lately.  At rest, she sometimes feels like her heart is going quicker. Objective:    BP (!) 145/84 (BP Location: Left Arm, Patient Position: Sitting, Cuff Size: Large)   Pulse 64   Temp 97.8 F (36.6 C) (Temporal)   Resp 20   Wt 207 lb (93.9 kg)   BMI 36.67 kg/m   General:  alert, cooperative and no distress  Neck is supple without hematoma or masses.  Incision well-healed.     Assessment:    Doing well postoperatively.    Plan:   Have told patient to decrease her Synthroid to one half a pill a day.  Should she develop increased tiredness, she should go to one half a pill every other day and 1 pill every other day.  She is to see Dr. Dorris Fetch next month.

## 2018-04-16 ENCOUNTER — Telehealth (INDEPENDENT_AMBULATORY_CARE_PROVIDER_SITE_OTHER): Payer: Self-pay | Admitting: *Deleted

## 2018-04-16 NOTE — Telephone Encounter (Signed)
Patient called with progress report. She was seen in the office on 04/01/2018 . She was given antibiotic(Flagyl) and dicyclomine. She says that she could tell a difference with the flatulence. Since being off the antibiotic the flatulence is a little worse. She continues with the diarrhea. Going 4-5 times a day. This morning she has gone 5 times starting at 8:30 am.  Dr.Rehman to be made aware.

## 2018-04-17 NOTE — Telephone Encounter (Signed)
Talked with Dr.Rehman about the progress report. He ask that the patient take the Dicyclomine 10 mg by mouth three times a day , and take Imodium 2 mg by mouth every morning.  Patient called and made aware.

## 2018-04-20 ENCOUNTER — Observation Stay (HOSPITAL_COMMUNITY)
Admission: EM | Admit: 2018-04-20 | Discharge: 2018-04-21 | Disposition: A | Discharge status: Home / Self Care | Payer: 59 | Attending: Internal Medicine | Admitting: Internal Medicine

## 2018-04-20 ENCOUNTER — Other Ambulatory Visit: Payer: Self-pay

## 2018-04-20 ENCOUNTER — Emergency Department (HOSPITAL_COMMUNITY): Payer: 59

## 2018-04-20 ENCOUNTER — Encounter (HOSPITAL_COMMUNITY): Payer: Self-pay

## 2018-04-20 DIAGNOSIS — Z85850 Personal history of malignant neoplasm of thyroid: Secondary | ICD-10-CM

## 2018-04-20 DIAGNOSIS — R079 Chest pain, unspecified: Secondary | ICD-10-CM | POA: Diagnosis present

## 2018-04-20 DIAGNOSIS — E876 Hypokalemia: Secondary | ICD-10-CM | POA: Diagnosis not present

## 2018-04-20 DIAGNOSIS — R072 Precordial pain: Principal | ICD-10-CM | POA: Insufficient documentation

## 2018-04-20 DIAGNOSIS — R0789 Other chest pain: Secondary | ICD-10-CM | POA: Diagnosis present

## 2018-04-20 DIAGNOSIS — R197 Diarrhea, unspecified: Secondary | ICD-10-CM

## 2018-04-20 DIAGNOSIS — E785 Hyperlipidemia, unspecified: Secondary | ICD-10-CM | POA: Diagnosis present

## 2018-04-20 DIAGNOSIS — I10 Essential (primary) hypertension: Secondary | ICD-10-CM | POA: Insufficient documentation

## 2018-04-20 DIAGNOSIS — K219 Gastro-esophageal reflux disease without esophagitis: Secondary | ICD-10-CM | POA: Diagnosis present

## 2018-04-20 DIAGNOSIS — J45909 Unspecified asthma, uncomplicated: Secondary | ICD-10-CM | POA: Diagnosis not present

## 2018-04-20 DIAGNOSIS — Z85528 Personal history of other malignant neoplasm of kidney: Secondary | ICD-10-CM | POA: Insufficient documentation

## 2018-04-20 DIAGNOSIS — K591 Functional diarrhea: Secondary | ICD-10-CM

## 2018-04-20 LAB — CBC
HCT: 37.3 % (ref 36.0–46.0)
Hemoglobin: 11.5 g/dL — ABNORMAL LOW (ref 12.0–15.0)
MCH: 27.6 pg (ref 26.0–34.0)
MCHC: 30.8 g/dL (ref 30.0–36.0)
MCV: 89.7 fL (ref 78.0–100.0)
PLATELETS: 162 10*3/uL (ref 150–400)
RBC: 4.16 MIL/uL (ref 3.87–5.11)
RDW: 16.2 % — AB (ref 11.5–15.5)
WBC: 6.1 10*3/uL (ref 4.0–10.5)

## 2018-04-20 LAB — BASIC METABOLIC PANEL
Anion gap: 8 (ref 5–15)
BUN: 10 mg/dL (ref 6–20)
CHLORIDE: 106 mmol/L (ref 98–111)
CO2: 27 mmol/L (ref 22–32)
CREATININE: 0.72 mg/dL (ref 0.44–1.00)
Calcium: 9.1 mg/dL (ref 8.9–10.3)
GFR calc Af Amer: >60 mL/min (ref >60)
GFR calc non Af Amer: >60 mL/min (ref >60)
GLUCOSE: 169 mg/dL — AB (ref 70–99)
Potassium: 2.7 mmol/L — CL (ref 3.5–5.1)
Sodium: 141 mmol/L (ref 135–145)

## 2018-04-20 LAB — CBG MONITORING, ED: Glucose-Capillary: 179 mg/dL — ABNORMAL HIGH (ref 70–99)

## 2018-04-20 LAB — TROPONIN I
Troponin I: <0.03 ng/mL (ref <0.03)
Troponin I: <0.03 ng/mL (ref <0.03)
Troponin I: <0.03 ng/mL (ref <0.03)

## 2018-04-20 LAB — GLUCOSE, CAPILLARY: GLUCOSE-CAPILLARY: 51 mg/dL — AB (ref 70–99)

## 2018-04-20 LAB — TSH
TSH: 0.72 u[IU]/mL (ref 0.350–4.500)
TSH: 0.638 u[IU]/mL (ref 0.350–4.500)

## 2018-04-20 LAB — MAGNESIUM: MAGNESIUM: 2 mg/dL (ref 1.7–2.4)

## 2018-04-20 LAB — D-DIMER, QUANTITATIVE: D-Dimer, Quant: 0.5 ug/mL-FEU (ref 0.00–0.50)

## 2018-04-20 MED ORDER — ACETAMINOPHEN 325 MG PO TABS: 650.0000 mg | ORAL_TABLET | ORAL | Status: DC | PRN

## 2018-04-20 MED ORDER — POTASSIUM CHLORIDE CRYS ER 20 MEQ PO TBCR
20.0000 meq | EXTENDED_RELEASE_TABLET | Freq: Every day | ORAL | Status: DC
  Administered 2018-04-20 – 2018-04-21 (×2): 20 meq via ORAL
  Filled 2018-04-20 (×2): qty 1

## 2018-04-20 MED ORDER — ROSUVASTATIN CALCIUM 10 MG PO TABS
5.0000 mg | ORAL_TABLET | Freq: Every day | ORAL | Status: DC
  Administered 2018-04-20 – 2018-04-21 (×2): 5 mg via ORAL
  Filled 2018-04-20 (×2): qty 1

## 2018-04-20 MED ORDER — DULOXETINE HCL 20 MG PO CPEP
20.0000 mg | ORAL_CAPSULE | Freq: Two times a day (BID) | ORAL | Status: DC
  Administered 2018-04-20 – 2018-04-21 (×3): 20 mg via ORAL
  Filled 2018-04-20 (×3): qty 1

## 2018-04-20 MED ORDER — ENOXAPARIN SODIUM 40 MG/0.4ML ~~LOC~~ SOLN
40.0000 mg | SUBCUTANEOUS | Status: DC
  Administered 2018-04-20: 40 mg via SUBCUTANEOUS
  Filled 2018-04-20 (×2): qty 0.4

## 2018-04-20 MED ORDER — DICYCLOMINE HCL 10 MG PO CAPS: 10.0000 mg | ORAL_CAPSULE | Freq: Three times a day (TID) | ORAL | Status: DC | PRN

## 2018-04-20 MED ORDER — PANTOPRAZOLE SODIUM 40 MG PO TBEC
40.0000 mg | DELAYED_RELEASE_TABLET | Freq: Every day | ORAL | Status: DC
  Administered 2018-04-21: 40 mg via ORAL
  Filled 2018-04-20: qty 1

## 2018-04-20 MED ORDER — GI COCKTAIL ~~LOC~~: 30.0000 mL | Freq: Four times a day (QID) | ORAL | Status: DC | PRN

## 2018-04-20 MED ORDER — BACLOFEN 10 MG PO TABS
10.0000 mg | ORAL_TABLET | Freq: Every day | ORAL | Status: DC
  Administered 2018-04-20: 10 mg via ORAL
  Filled 2018-04-20: qty 1

## 2018-04-20 MED ORDER — POTASSIUM CHLORIDE CRYS ER 20 MEQ PO TBCR
40.0000 meq | EXTENDED_RELEASE_TABLET | ORAL | Status: AC
  Administered 2018-04-20 – 2018-04-21 (×4): 40 meq via ORAL
  Filled 2018-04-20 (×4): qty 2

## 2018-04-20 MED ORDER — NITROGLYCERIN 0.4 MG SL SUBL
0.4000 mg | SUBLINGUAL_TABLET | Freq: Once | SUBLINGUAL | Status: AC
Start: 2018-04-20 — End: 2018-04-20
  Administered 2018-04-20: 0.4 mg via SUBLINGUAL
  Filled 2018-04-20: qty 1

## 2018-04-20 MED ORDER — POTASSIUM CHLORIDE 10 MEQ/100ML IV SOLN
10.0000 meq | INTRAVENOUS | Status: DC
  Administered 2018-04-20: 10 meq via INTRAVENOUS
  Filled 2018-04-20: qty 100

## 2018-04-20 MED ORDER — ONDANSETRON 4 MG PO TBDP: 4.0000 mg | ORAL_TABLET | Freq: Three times a day (TID) | ORAL | Status: DC | PRN

## 2018-04-20 MED ORDER — ASPIRIN EC 325 MG PO TBEC
325.0000 mg | DELAYED_RELEASE_TABLET | Freq: Every day | ORAL | Status: DC
  Administered 2018-04-20 – 2018-04-21 (×2): 325 mg via ORAL
  Filled 2018-04-20 (×2): qty 1

## 2018-04-20 MED ORDER — LEVOTHYROXINE SODIUM 100 MCG PO TABS
100.0000 ug | ORAL_TABLET | Freq: Every day | ORAL | Status: DC
  Administered 2018-04-21: 100 ug via ORAL
  Filled 2018-04-20: qty 1

## 2018-04-20 MED ORDER — LOSARTAN POTASSIUM 50 MG PO TABS
100.0000 mg | ORAL_TABLET | Freq: Every day | ORAL | Status: DC
Start: 2018-04-20 — End: 2018-04-21
  Administered 2018-04-21: 100 mg via ORAL
  Filled 2018-04-20 (×2): qty 2

## 2018-04-20 MED ORDER — HYDROCHLOROTHIAZIDE 25 MG PO TABS
25.0000 mg | ORAL_TABLET | Freq: Every day | ORAL | Status: DC
  Administered 2018-04-20 – 2018-04-21 (×2): 25 mg via ORAL
  Filled 2018-04-20 (×2): qty 1

## 2018-04-20 MED ORDER — ONDANSETRON HCL 4 MG/2ML IJ SOLN: 4.0000 mg | Freq: Four times a day (QID) | INTRAMUSCULAR | Status: DC | PRN

## 2018-04-20 MED ORDER — ASPIRIN 81 MG PO CHEW
324.0000 mg | CHEWABLE_TABLET | Freq: Once | ORAL | Status: AC
  Administered 2018-04-20: 324 mg via ORAL
  Filled 2018-04-20: qty 4

## 2018-04-20 NOTE — ED Notes (Signed)
Date and time results received: 04/20/18 1:01 PM  (use smartphrase ".now" to insert current time)  Test: K+ Critical Value: 2.7  Name of Provider Notified: Long  Orders Received? Or Actions Taken?: Orders Received - See Orders for details

## 2018-04-20 NOTE — Progress Notes (Signed)
Cpap setup and placed on patient. Unit plugged into red outlet.

## 2018-04-20 NOTE — ED Notes (Signed)
EKG completed. Given to Dr. Laverta Baltimore.

## 2018-04-20 NOTE — ED Triage Notes (Addendum)
Pt reports that she was sitting in church and a feeling of heaviness and strangling felt as if rising from chest into throat. Reports diarrhea since removal of thyroid in july

## 2018-04-20 NOTE — H&P (Signed)
History and Physical    Rebecca Scott:[id number] DOB: Nov 15, 1955 DOA: 04/20/2018  Referring MD/NP/PA: Nanda Quinton, EDP PCP: Janora Norlander, DO  Patient coming from: Home  Chief Complaint: Chest tightness, weakness, "my body is giving out"  HPI: Rebecca Scott is a 62 y.o. female with multiple medical co-morbidities including: obesity, HTN, HLD, papillary thyroid cancer, RCC, GERD, comes to the hospital today with a sensation of squeezing in the center of her chest as well as severe weakness feeling as if "her entire body were going to give out". She was at church today when this occurred. She has been having diarrhea for the past 2 months, ever since she had her thyroid removed and was placed on synthroid. In the ED VS were stable,  Labs normal with the exception of a K of 2.7, trop <0.03, TSH normal at 0.720, d-dimer WNL at 0.50. EKG with some mild ST depression of the inferior leads (present on prior EKG) and mild QT prolongation. With a HEART score of 5 (2 for EKG changes, 1 for age and 2 for risk factors) admission was requested for further evaluation and management.  Past Medical/Surgical History: Past Medical History:  Diagnosis Date  . Anemia   . Arthritis   . Asthma    seasonal asthma rarely   . Chronic kidney disease    renal mass right kidney   . CPAP (continuous positive airway pressure) dependence   . Diverticulitis   . GERD (gastroesophageal reflux disease)   . Hemorrhoids   . History of kidney stones   . Hyperlipemia   . Hypertension   . Migraine   . Papillary renal cell carcinoma ([place]) 05/2017   s/p partial nephrectomy, right  . PONV (postoperative nausea and vomiting)    patient reports being "slower to wake than average "   . Pre-diabetes    "ive been told i'm borderline"   . Sleep apnea    CPAP use     Past Surgical History:  Procedure Laterality Date  . ABDOMINAL HYSTERECTOMY    . APPENDECTOMY  1978  . BIOPSY  07/08/2017   Procedure:  BIOPSY;  Surgeon: Rogene Houston, MD;  Location: [facility];  Service: Endoscopy;;  gastric  . CHOLECYSTECTOMY  2005  . COLONOSCOPY WITH PROPOFOL N/A 10/28/2017   Procedure: COLONOSCOPY WITH PROPOFOL;  Surgeon: Rogene Houston, MD;  Location: [facility];  Service: Endoscopy;  Laterality: N/A;  7:30  . ESOPHAGOGASTRODUODENOSCOPY N/A 07/08/2017   Procedure: ESOPHAGOGASTRODUODENOSCOPY (EGD);  Surgeon: Rogene Houston, MD;  Location: [facility];  Service: Endoscopy;  Laterality: N/A;  220  . NASAL SEPTUM SURGERY    . ROBOTIC ASSITED PARTIAL NEPHRECTOMY Right 05/17/2017   Procedure: XI ROBOTIC ASSITED PARTIAL NEPHRECTOMY;  Surgeon: Alexis Frock, MD;  Location: [facility];  Service: Urology;  Laterality: Right;  . THYROIDECTOMY N/A 03/10/2018   Procedure: TOTAL THYROIDECTOMY;  Surgeon: Aviva Signs, MD;  Location: [facility];  Service: General;  Laterality: N/A;    Social History:  reports that she has never smoked. She has never used smokeless tobacco. She reports that she drinks alcohol. She reports that she does not use drugs.  Allergies: Allergies  Allergen Reactions  . Iodinated Diagnostic Agents Hives and Itching  . Isovue [Iopamidol] Itching    And hives   . Nitrofurantoin Hives  . Other Hives, Itching, Rash and Other (See Comments)    Use Paper tape ONLY  . Sulfa Antibiotics Hives  . Adhesive [Tape]  Rash and Other (See Comments)    Use Paper tape ONLY  . Mango Flavor Swelling and Rash    Tongue swelling, feels like throat is closing in on her     Family History:  Family History  Problem Relation Age of Onset  . COPD Mother   . Diabetes Mother   . Hypertension Mother   . High Cholesterol Mother   . Liver cancer Mother   . COPD Sister   . Hypertension Brother   . High Cholesterol Brother   . Hypertension Brother   . Asthma Son     Prior to Admission medications   Medication Sig Start Date End Date Taking? Authorizing Provider  baclofen (LIORESAL) 10 MG tablet Take  10 mg by mouth at bedtime.   Yes [provider]  calcium carbonate (OS-CAL - DOSED IN MG OF ELEMENTAL CALCIUM) 1250 (500 Ca) MG tablet Take 1 tablet (500 mg of elemental calcium total) by mouth 2 (two) times daily with a meal. 03/11/18  Yes Aviva Signs, MD  dicyclomine (BENTYL) 10 MG capsule Take 1 capsule (10 mg total) by mouth 3 (three) times daily as needed for spasms. 04/01/18  Yes Rehman, Mechele Dawley, MD  DULoxetine (CYMBALTA) 20 MG capsule Take 20 mg by mouth 2 (two) times daily.   Yes [provider]  hydrochlorothiazide (HYDRODIURIL) 25 MG tablet Take 1 tablet (25 mg total) by mouth daily. 02/18/18  Yes Cassandria Anger, MD  levothyroxine (SYNTHROID, LEVOTHROID) 100 MCG tablet Take 1 tablet (100 mcg total) by mouth daily before breakfast. 03/20/18  Yes Aviva Signs, MD  losartan (COZAAR) 100 MG tablet Take 1 tablet (100 mg total) by mouth daily. 12/16/17 04/20/18 Yes Branch, Alphonse Guild, MD  ondansetron (ZOFRAN ODT) 4 MG disintegrating tablet Take 1 tablet (4 mg total) every 8 (eight) hours as needed by mouth for nausea or vomiting. 4mg  ODT q4 hours prn nausea/vomit 07/30/17  Yes Rehman, Mechele Dawley, MD  pantoprazole (PROTONIX) 40 MG tablet Take 1 tablet (40 mg total) by mouth daily before breakfast. 04/01/18  Yes Rehman, Mechele Dawley, MD  potassium chloride SA (K-DUR,KLOR-CON) 20 MEQ tablet Take 20 mEq by mouth daily.  09/13/17  Yes [provider]  rosuvastatin (CRESTOR) 5 MG tablet Take 5 mg by mouth daily.  08/18/15  Yes [provider]  metroNIDAZOLE (FLAGYL) 250 MG tablet Take 1 tablet (250 mg total) by mouth 3 (three) times daily. Patient not taking: Reported on 04/20/2018 04/01/18   Rogene Houston, MD    Review of Systems:  Constitutional: Denies fever, chills, diaphoresis, appetite change and fatigue.  HEENT: Denies photophobia, eye pain, redness, hearing loss, ear pain, congestion, sore throat, rhinorrhea, sneezing, mouth sores, trouble swallowing, neck pain,  neck stiffness and tinnitus.   Respiratory: Denies SOB, DOE, cough,  and wheezing.   Cardiovascular: Denies chest pain, palpitations and leg swelling.  Gastrointestinal: Denies nausea, vomiting, abdominal pain, diarrhea, constipation, blood in stool and abdominal distention.  Genitourinary: Denies dysuria, urgency, frequency, hematuria, flank pain and difficulty urinating.  Endocrine: Denies: hot or cold intolerance, sweats, changes in hair or nails, polyuria, polydipsia. Musculoskeletal: Denies myalgias, back pain, joint swelling, arthralgias and gait problem.  Skin: Denies pallor, rash and wound.  Neurological: Denies dizziness, seizures, syncope, weakness, light-headedness, numbness and headaches.  Hematological: Denies adenopathy. Easy bruising, personal or family bleeding history  Psychiatric/Behavioral: Denies suicidal ideation, mood changes, confusion, nervousness, sleep disturbance and agitation    Physical Exam: Vitals:   04/20/18 1245 04/20/18 1330  04/20/18 1345 04/20/18 1455  BP: 100/63 105/65 105/69 (!) 105/59  Pulse: 63 60 (!) 55 (!) 57  Resp: 10 18 14 16   Temp:    98.2 F (36.8 C)  TempSrc:    Oral  SpO2: 98% 97% 96% 99%  Weight:      Height:          Constitutional: NAD, calm, comfortable Eyes: PERRL, lids and conjunctivae normal ENMT: Mucous membranes are moist. Posterior pharynx clear of any exudate or lesions.Normal dentition.  Neck: normal, supple, no masses, no thyromegaly Respiratory: clear to auscultation bilaterally, no wheezing, no crackles. Normal respiratory effort. No accessory muscle use.  Cardiovascular: Regular rate and rhythm, no murmurs / rubs / gallops. No extremity edema. 2+ pedal pulses. No carotid bruits.  Abdomen: no tenderness, no masses palpated. No hepatosplenomegaly. Bowel sounds positive.  Musculoskeletal: no clubbing / cyanosis. No joint deformity upper and lower extremities. Good ROM, no contractures. Normal muscle tone.  Skin: no  rashes, lesions, ulcers. No induration Neurologic: CN 2-12 grossly intact. Sensation intact, DTR normal. Strength 5/5 in all 4.  Psychiatric: Normal judgment and insight. Alert and oriented x 3. Normal mood.    Labs on Admission: I have personally reviewed the following labs and imaging studies  CBC: Recent Labs  Lab 04/20/18 1209  WBC 6.1  HGB 11.5*  HCT 37.3  MCV 89.7  PLT 696   Basic Metabolic Panel: Recent Labs  Lab 04/20/18 1209  NA 141  K 2.7*  CL 106  CO2 27  GLUCOSE 169*  BUN 10  CREATININE 0.72  CALCIUM 9.1   GFR: Estimated Creatinine Clearance: 81 mL/min (by C-G formula based on SCr of 0.72 mg/dL). Liver Function Tests: No results for input(s): AST, ALT, ALKPHOS, BILITOT, PROT, ALBUMIN in the last 168 hours. No results for input(s): LIPASE, AMYLASE in the last 168 hours. No results for input(s): AMMONIA in the last 168 hours. Coagulation Profile: No results for input(s): INR, PROTIME in the last 168 hours. Cardiac Enzymes: Recent Labs  Lab 04/20/18 1209  TROPONINI <0.03   BNP (last 3 results) No results for input(s): PROBNP in the last 8760 hours. HbA1C: No results for input(s): HGBA1C in the last 72 hours. CBG: Recent Labs  Lab 04/20/18 1208 04/20/18 1522  GLUCAP 179* 51*   Lipid Profile: No results for input(s): CHOL, HDL, LDLCALC, TRIG, CHOLHDL, LDLDIRECT in the last 72 hours. Thyroid Function Tests: Recent Labs    04/20/18 1217  TSH 0.720   Anemia Panel: No results for input(s): VITAMINB12, FOLATE, FERRITIN, TIBC, IRON, RETICCTPCT in the last 72 hours. Urine analysis:    Component Value Date/Time   COLORURINE STRAW (A) 06/27/2017 1800   APPEARANCEUR Clear 01/20/2018 1120   LABSPEC 1.010 06/27/2017 1800   PHURINE 7.0 06/27/2017 1800   GLUCOSEU Negative 01/20/2018 1120   HGBUR NEGATIVE 06/27/2017 1800   BILIRUBINUR Negative 01/20/2018 1120   KETONESUR NEGATIVE 06/27/2017 1800   PROTEINUR Negative 01/20/2018 1120   PROTEINUR  NEGATIVE 06/27/2017 1800   NITRITE Negative 01/20/2018 1120   NITRITE NEGATIVE 06/27/2017 1800   LEUKOCYTESUR Negative 01/20/2018 1120   Sepsis Labs: @LABRCNTIP (procalcitonin:4,lacticidven:4) )No results found for this or any previous visit (from the past 240 hour(s)).   Radiological Exams on Admission: Dg Chest 2 View  Result Date: 04/20/2018 CLINICAL DATA:  Chest pain and heaviness. EXAM: CHEST - 2 VIEW COMPARISON:  03/06/2018 FINDINGS: Cardiac silhouette is normal in size and configuration. No mediastinal or hilar masses. No evidence of adenopathy. Mild  linear scarring the left lung base, stable. Lungs otherwise clear. No pleural effusion or pneumothorax. Skeletal structures are intact. IMPRESSION: No active cardiopulmonary disease. Electronically Signed   By: Lajean Manes M.D.   On: 04/20/2018 13:09    EKG: Independently reviewed. NSR, QT prolongation, mild ST depression inferior leads  Assessment/Plan Principal Problem:   Chest pain Active Problems:   HTN (hypertension)   Hyperlipidemia   Gastroesophageal reflux disease without esophagitis   History of renal cell carcinoma   Hx of papillary thyroid carcinoma   Hypokalemia   Diarrhea    Chest Pain -Will admit to telemetry, r/o for ACS with serial troponins and repeat EKG in am. -Check ECHO as prior echo is 61 years old. -If rules out, will consider OP cardiology follow up for possible stress testing. -D-Dimer WNL, agree with no CTA chest at this time.  Hypokalemia -Due to GI losses with diarrhea. -Replace PO, check Mg levels and replace as needed.  Diarrhea -Is chronic at this point, so doubt infectious etiology. -Question related to synthroid, but with normal TSH unlikely due to hyperthyroidism. -Continue to follow.  GERD -Continue daily PPI.  HLD -Continue statin.  HTN -Well controlled. -Continue home medications.  H/o Papillary Thyroid and RCC -Continue OP management.   DVT prophylaxis: lovenox  Code  Status: Full Code  Family Communication: patient only  Disposition Plan: Likely home in 24 hours pending completion of medical work up.  Consults called: None  Admission status: It is my clinical opinion that referral for OBSERVATION is reasonable and necessary in this patient based on the above information provided. The aforementioned taken together are felt to place the patient at high risk for further clinical deterioration. However it is anticipated that the patient may be medically stable for discharge from the hospital within 24 to 48 hours.      Time Spent: 85 minutes  Tyran Huser Isaac Bliss MD [facility] Pager [phone]  If 7PM-7AM, please contact night-coverage [email or web address] Password [facility]  04/20/2018, 3:24 PM

## 2018-04-20 NOTE — ED Provider Notes (Signed)
Emergency Department Provider Note   I have reviewed the triage vital signs and the nursing notes.   HISTORY  Chief Complaint Chest Pain   HPI Rebecca Scott is a 62 y.o. female with PMH of asthma, HTN, HLD, papillary renal cell carcinoma, and GERD's to the emergency department for evaluation of sudden onset central chest pressure and feeling of heaviness.  She states she was sitting in church when this began.  She reports some mild dyspnea.  She states that felt like her heart was beating very hard and that is improved but she continues to have pressure radiating up to the throat and right shoulder blade.  She denies any fevers or chills.  No modifying factors.  She reports occasional similar symptoms in the past but nothing this severe or persistent.  She has no prior history of coronary artery disease.  She recently had thyroidectomy and has had diarrhea since that procedure.  She does have papillary renal cell carcinoma but has not started chemotherapy or radiation at this time.   Past Medical History:  Diagnosis Date  . Anemia   . Arthritis   . Asthma    seasonal asthma rarely   . Chronic kidney disease    renal mass right kidney   . CPAP (continuous positive airway pressure) dependence   . Diverticulitis   . GERD (gastroesophageal reflux disease)   . Hemorrhoids   . History of kidney stones   . Hyperlipemia   . Hypertension   . Migraine   . Papillary renal cell carcinoma ([place]) 05/2017   s/p partial nephrectomy, right  . PONV (postoperative nausea and vomiting)    patient reports being "slower to wake than average "   . Pre-diabetes    "ive been told i'm borderline"   . Sleep apnea    CPAP use     Patient Active Problem List   Diagnosis Date Noted  . Chest pain 04/20/2018  . Hx of papillary thyroid carcinoma 03/26/2018  . S/P total thyroidectomy 03/10/2018  . Obstructive sleep apnea 03/05/2018  . Essential hypertension, benign 02/18/2018  . Aortic  atherosclerosis ([place]) 01/24/2018  . History of renal cell carcinoma 12/03/2017  . Reactive depression 10/22/2017  . Change in bowel habits 10/07/2017  . Multinodular goiter 08/21/2017  . Prediabetes 08/21/2017  . Gastroesophageal reflux disease without esophagitis 07/05/2017  . HTN (hypertension) 07/29/2014  . Hyperlipidemia 07/29/2014  . Syncope 07/29/2014  . Mixed stress and urge urinary incontinence 01/14/2014  . Recurrent UTI 01/14/2014  . Vaginal atrophy 01/14/2014  . Benign lesion of eyelid 02/07/2012    Past Surgical History:  Procedure Laterality Date  . ABDOMINAL HYSTERECTOMY    . APPENDECTOMY  1978  . BIOPSY  07/08/2017   Procedure: BIOPSY;  Surgeon: Rogene Houston, MD;  Location: [facility];  Service: Endoscopy;;  gastric  . CHOLECYSTECTOMY  2005  . COLONOSCOPY WITH PROPOFOL N/A 10/28/2017   Procedure: COLONOSCOPY WITH PROPOFOL;  Surgeon: Rogene Houston, MD;  Location: [facility];  Service: Endoscopy;  Laterality: N/A;  7:30  . ESOPHAGOGASTRODUODENOSCOPY N/A 07/08/2017   Procedure: ESOPHAGOGASTRODUODENOSCOPY (EGD);  Surgeon: Rogene Houston, MD;  Location: [facility];  Service: Endoscopy;  Laterality: N/A;  220  . NASAL SEPTUM SURGERY    . ROBOTIC ASSITED PARTIAL NEPHRECTOMY Right 05/17/2017   Procedure: XI ROBOTIC ASSITED PARTIAL NEPHRECTOMY;  Surgeon: Alexis Frock, MD;  Location: [facility];  Service: Urology;  Laterality: Right;  . THYROIDECTOMY N/A 03/10/2018  Procedure: TOTAL THYROIDECTOMY;  Surgeon: Aviva Signs, MD;  Location: [facility];  Service: General;  Laterality: N/A;    Allergies Iodinated diagnostic agents; Isovue [iopamidol]; Nitrofurantoin; Other; Sulfa antibiotics; Adhesive [tape]; and Mango flavor  Family History  Problem Relation Age of Onset  . COPD Mother   . Diabetes Mother   . Hypertension Mother   . High Cholesterol Mother   . Liver cancer Mother   . COPD Sister   . Hypertension Brother   . High Cholesterol Brother   .  Hypertension Brother   . Asthma Son     Social History Social History   Tobacco Use  . Smoking status: Never Smoker  . Smokeless tobacco: Never Used  Substance Use Topics  . Alcohol use: Yes    Comment: rarely   . Drug use: No    Review of Systems  Constitutional: No fever/chills Eyes: No visual changes. ENT: No sore throat. Cardiovascular: Positive chest pain/pressure. Respiratory: Positive mild shortness of breath (resolved). Gastrointestinal: No abdominal pain.  No nausea, no vomiting. Positive diarrhea since July.  No constipation. Genitourinary: Negative for dysuria. Musculoskeletal: Negative for back pain. Skin: Negative for rash. Neurological: Negative for headaches, focal weakness or numbness.  10-point ROS otherwise negative.  ____________________________________________   PHYSICAL EXAM:  VITAL SIGNS: ED Triage Vitals  Enc Vitals Group     BP 04/20/18 1207 136/80     Pulse Rate 04/20/18 1207 72     Resp 04/20/18 1207 (!) 22     Temp 04/20/18 1207 97.8 F (36.6 C)     Temp Source 04/20/18 1207 Oral     SpO2 04/20/18 1207 96 %     Weight 04/20/18 1206 205 lb (93 kg)     Height 04/20/18 1206 5\' 3"  (1.6 m)     Pain Score 04/20/18 1206 8   Constitutional: Alert and oriented. Well appearing and in no acute distress. Eyes: Conjunctivae are normal.  Head: Atraumatic. Nose: No congestion/rhinnorhea. Mouth/Throat: Mucous membranes are moist.  Neck: No stridor. Cardiovascular: Normal rate, regular rhythm. Good peripheral circulation. Grossly normal heart sounds.   Respiratory: Normal respiratory effort.  No retractions. Lungs CTAB. Gastrointestinal: Soft and nontender. No distention.  Musculoskeletal: No lower extremity tenderness nor edema. No gross deformities of extremities. Neurologic:  Normal speech and language. No gross focal neurologic deficits are appreciated.  Skin:  Skin is warm, dry and intact. No rash  noted.  ____________________________________________   LABS (all labs ordered are listed, but only abnormal results are displayed)  Labs Reviewed  BASIC METABOLIC PANEL - Abnormal; Notable for the following components:      Result Value   Potassium 2.7 (*)    Glucose, Bld 169 (*)    All other components within normal limits  CBC - Abnormal; Notable for the following components:   Hemoglobin 11.5 (*)    RDW 16.2 (*)    All other components within normal limits  CBG MONITORING, ED - Abnormal; Notable for the following components:   Glucose-Capillary 179 (*)    All other components within normal limits  TROPONIN I  TSH  D-DIMER, QUANTITATIVE (NOT AT [facility])   ____________________________________________  EKG   EKG Interpretation  Date/Time:  Sunday April 20 2018 12:06:42 EDT Ventricular Rate:  72 PR Interval:    QRS Duration: 97 QT Interval:  507 QTC Calculation: 555 R Axis:   50 Text Interpretation:  Sinus rhythm Borderline repolarization abnormality Prolonged QT interval No STEMI. ST changes similar to prior.  Confirmed  by Nanda Quinton [phone]) on 04/20/2018 12:18:37 PM       ____________________________________________  RADIOLOGY  Dg Chest 2 View  Result Date: 04/20/2018 CLINICAL DATA:  Chest pain and heaviness. EXAM: CHEST - 2 VIEW COMPARISON:  03/06/2018 FINDINGS: Cardiac silhouette is normal in size and configuration. No mediastinal or hilar masses. No evidence of adenopathy. Mild linear scarring the left lung base, stable. Lungs otherwise clear. No pleural effusion or pneumothorax. Skeletal structures are intact. IMPRESSION: No active cardiopulmonary disease. Electronically Signed   By: Lajean Manes M.D.   On: 04/20/2018 13:09    ____________________________________________   PROCEDURES  Procedure(s) performed:   Procedures  None ____________________________________________   INITIAL IMPRESSION / ASSESSMENT AND PLAN / ED COURSE  Pertinent labs &  imaging results that were available during my care of the patient were reviewed by me and considered in my medical decision making (see chart for details).  Patient presents to the emergency department with central chest heaviness and tightness.  Had some palpitation symptoms at church but those have resolved.  Mild dyspnea.  Patient with some increased risk for PE given her ongoing cancer.  No hypoxia or tachycardia.  Patient's EKG has some nonspecific ST changes which appear unchanged from her last tracing in July of this year.  She does have several risk factors for coronary artery disease.  And to give aspirin and nitroglycerin.  HEART score 5 (EKG 2, Age 56, Risk factors 2).   Differential includes all life-threatening causes for chest pain. This includes but is not exclusive to acute coronary syndrome, aortic dissection, pulmonary embolism, cardiac tamponade, community-acquired pneumonia, pericarditis, musculoskeletal chest wall pain, etc.  Labs reviewed. Patient with hypokalemia to 2.7. Did take potassium supplements this AM and known history of this. Troponin negative x 1 and normal D-dimer. CXR with no acute findings. Plan for chest pain obs. Will discuss with the hospitalist team.   Discussed patient's case with Hospitalist, Dr. Jerilee Hoh to request admission. Patient and family (if present) updated with plan. Care transferred to Hospitalist service.  I reviewed all nursing notes, vitals, pertinent old records, EKGs, labs, imaging (as available).  ____________________________________________  FINAL CLINICAL IMPRESSION(S) / ED DIAGNOSES  Final diagnoses:  Precordial chest pain  Hypokalemia    MEDICATIONS GIVEN DURING THIS VISIT:  Medications  potassium chloride 10 mEq in 100 mL IVPB (10 mEq Intravenous New Bag/Given 04/20/18 1339)  aspirin chewable tablet 324 mg (324 mg Oral Given 04/20/18 1222)  nitroGLYCERIN (NITROSTAT) SL tablet 0.4 mg (0.4 mg Sublingual Given 04/20/18 1227)     Note:  This document was prepared using Dragon voice recognition software and may include unintentional dictation errors.  Nanda Quinton, MD Emergency Medicine    Braydn Carneiro, [facility], MD 04/20/18 [phone]

## 2018-04-20 NOTE — ED Notes (Signed)
Pt is now back from xray

## 2018-04-21 ENCOUNTER — Observation Stay (HOSPITAL_BASED_OUTPATIENT_CLINIC_OR_DEPARTMENT_OTHER): Payer: 59

## 2018-04-21 DIAGNOSIS — I5030 Unspecified diastolic (congestive) heart failure: Secondary | ICD-10-CM

## 2018-04-21 DIAGNOSIS — R072 Precordial pain: Secondary | ICD-10-CM | POA: Diagnosis not present

## 2018-04-21 DIAGNOSIS — J45909 Unspecified asthma, uncomplicated: Secondary | ICD-10-CM | POA: Diagnosis not present

## 2018-04-21 DIAGNOSIS — E876 Hypokalemia: Secondary | ICD-10-CM | POA: Diagnosis not present

## 2018-04-21 DIAGNOSIS — K219 Gastro-esophageal reflux disease without esophagitis: Secondary | ICD-10-CM | POA: Diagnosis not present

## 2018-04-21 DIAGNOSIS — Z85528 Personal history of other malignant neoplasm of kidney: Secondary | ICD-10-CM | POA: Diagnosis not present

## 2018-04-21 DIAGNOSIS — I10 Essential (primary) hypertension: Secondary | ICD-10-CM | POA: Diagnosis not present

## 2018-04-21 LAB — CBC
HEMATOCRIT: 34.9 % — AB (ref 36.0–46.0)
HEMOGLOBIN: 10.5 g/dL — AB (ref 12.0–15.0)
MCH: 27.3 pg (ref 26.0–34.0)
MCHC: 30.1 g/dL (ref 30.0–36.0)
MCV: 90.9 fL (ref 78.0–100.0)
Platelets: 160 10*3/uL (ref 150–400)
RBC: 3.84 MIL/uL — AB (ref 3.87–5.11)
RDW: 16.6 % — ABNORMAL HIGH (ref 11.5–15.5)
WBC: 7.4 10*3/uL (ref 4.0–10.5)

## 2018-04-21 LAB — BASIC METABOLIC PANEL
Anion gap: 4 — ABNORMAL LOW (ref 5–15)
BUN: 10 mg/dL (ref 6–20)
CHLORIDE: 111 mmol/L (ref 98–111)
CO2: 27 mmol/L (ref 22–32)
CREATININE: 0.61 mg/dL (ref 0.44–1.00)
Calcium: 8.9 mg/dL (ref 8.9–10.3)
GFR calc Af Amer: >60 mL/min (ref >60)
GFR calc non Af Amer: >60 mL/min (ref >60)
Glucose, Bld: 111 mg/dL — ABNORMAL HIGH (ref 70–99)
POTASSIUM: 4.5 mmol/L (ref 3.5–5.1)
Sodium: 142 mmol/L (ref 135–145)

## 2018-04-21 LAB — ECHOCARDIOGRAM COMPLETE
Height: 63 in
WEIGHTICAEL: 3280 [oz_av]

## 2018-04-21 LAB — HIV ANTIBODY (ROUTINE TESTING W REFLEX): HIV SCREEN 4TH GENERATION: NONREACTIVE

## 2018-04-21 NOTE — Discharge Summary (Signed)
Physician Discharge Summary  Rebecca Scott HWT:[id number] DOB: Feb 06, 1956 DOA: 04/20/2018  PCP: Janora Norlander, DO  Admit date: 04/20/2018 Discharge date: 04/21/2018  Time spent: 45 minutes  Recommendations for Outpatient Follow-up:  -Will be discharged home today. -Advise follow-up with PCP in 2 weeks.  Discharge Diagnoses:  Principal Problem:   Chest pain Active Problems:   HTN (hypertension)   Hyperlipidemia   Gastroesophageal reflux disease without esophagitis   History of renal cell carcinoma   Hx of papillary thyroid carcinoma   Hypokalemia   Diarrhea   Discharge Condition: Stable and improved  Filed Weights   04/20/18 1206  Weight: 93 kg    History of present illness:  Rebecca Scott is a 62 y.o. female with multiple medical co-morbidities including: obesity, HTN, HLD, papillary thyroid cancer, RCC, GERD, comes to the hospital today with a sensation of squeezing in the center of her chest as well as severe weakness feeling as if "her entire body were going to give out". She was at church today when this occurred. She has been having diarrhea for the past 2 months, ever since she had her thyroid removed and was placed on synthroid. In the ED VS were stable,  Labs normal with the exception of a K of 2.7, trop <0.03, TSH normal at 0.720, d-dimer WNL at 0.50. EKG with some mild ST depression of the inferior leads (present on prior EKG) and mild QT prolongation. With a HEART score of 5 (2 for EKG changes, 1 for age and 2 for risk factors) admission was requested for further evaluation and management.   Hospital Course:   Chest pain -Has ruled out for ACS with serial troponins that have been negative and EKG does not demonstrate acute abnormalities. -No arrhythmias on telemetry. -2D echo: Ejection fraction of 55 to 60%, normal wall motion, grade 1 diastolic dysfunction. -No further cardiac work-up planned for at present. -She is advised to follow-up with  cardiology if she were to have further chest pain in the future.  Hypokalemia -Suspect due to GI losses with diarrhea. -Has been orally replaced. -Magnesium was normal at 2.0.  Diarrhea -Is chronic at this point, so doubt infectious etiology. -Question related to synthroid, but with normal TSH unlikely due to hyperthyroidism. -Continue to follow.  GERD -Continue daily PPI.  HLD -Continue statin.  HTN -Well controlled. -Continue home medications.  H/o Papillary Thyroid and RCC -Continue OP management   Procedures:  2D echo as above  Consultations:  None  Discharge Instructions  Discharge Instructions    Diet - low sodium heart healthy   Complete by:  As directed    Increase activity slowly   Complete by:  As directed      Allergies as of 04/21/2018      Reactions   Iodinated Diagnostic Agents Hives, Itching   Isovue [iopamidol] Itching   And hives    Nitrofurantoin Hives   Other Hives, Itching, Rash, Other (See Comments)   Use Paper tape ONLY   Sulfa Antibiotics Hives   Adhesive [tape] Rash, Other (See Comments)   Use Paper tape ONLY   Mango Flavor Swelling, Rash   Tongue swelling, feels like throat is closing in on her       Medication List    STOP taking these medications   metroNIDAZOLE 250 MG tablet Commonly known as:  FLAGYL     TAKE these medications   baclofen 10 MG tablet Commonly known as:  LIORESAL Take 10 mg  by mouth at bedtime.   calcium carbonate 1250 (500 Ca) MG tablet Commonly known as:  OS-CAL - dosed in mg of elemental calcium Take 1 tablet (500 mg of elemental calcium total) by mouth 2 (two) times daily with a meal.   dicyclomine 10 MG capsule Commonly known as:  BENTYL Take 1 capsule (10 mg total) by mouth 3 (three) times daily as needed for spasms.   DULoxetine 20 MG capsule Commonly known as:  CYMBALTA Take 20 mg by mouth 2 (two) times daily.   hydrochlorothiazide 25 MG tablet Commonly known as:   HYDRODIURIL Take 1 tablet (25 mg total) by mouth daily.   levothyroxine 100 MCG tablet Commonly known as:  SYNTHROID, LEVOTHROID Take 1 tablet (100 mcg total) by mouth daily before breakfast.   losartan 100 MG tablet Commonly known as:  COZAAR Take 1 tablet (100 mg total) by mouth daily.   ondansetron 4 MG disintegrating tablet Commonly known as:  ZOFRAN-ODT Take 1 tablet (4 mg total) every 8 (eight) hours as needed by mouth for nausea or vomiting. 4mg  ODT q4 hours prn nausea/vomit   pantoprazole 40 MG tablet Commonly known as:  PROTONIX Take 1 tablet (40 mg total) by mouth daily before breakfast.   potassium chloride SA 20 MEQ tablet Commonly known as:  K-DUR,KLOR-CON Take 20 mEq by mouth daily.   rosuvastatin 5 MG tablet Commonly known as:  CRESTOR Take 5 mg by mouth daily.      Allergies  Allergen Reactions  . Iodinated Diagnostic Agents Hives and Itching  . Isovue [Iopamidol] Itching    And hives   . Nitrofurantoin Hives  . Other Hives, Itching, Rash and Other (See Comments)    Use Paper tape ONLY  . Sulfa Antibiotics Hives  . Adhesive [Tape] Rash and Other (See Comments)    Use Paper tape ONLY  . Mango Flavor Swelling and Rash    Tongue swelling, feels like throat is closing in on her    Follow-up Information    Ronnie Doss M, DO. Schedule an appointment as soon as possible for a visit in 2 week(s).   Specialty:  Family Medicine Contact information: [place] [address] [phone]        Arnoldo Lenis, MD .   Specialty:  Cardiology Contact information: [place] [address] [phone]            The results of significant diagnostics from this hospitalization (including imaging, microbiology, ancillary and laboratory) are listed below for reference.    Significant Diagnostic Studies: Dg Chest 2 View  Result Date: 04/20/2018 CLINICAL DATA:  Chest pain and heaviness. EXAM: CHEST - 2 VIEW  COMPARISON:  03/06/2018 FINDINGS: Cardiac silhouette is normal in size and configuration. No mediastinal or hilar masses. No evidence of adenopathy. Mild linear scarring the left lung base, stable. Lungs otherwise clear. No pleural effusion or pneumothorax. Skeletal structures are intact. IMPRESSION: No active cardiopulmonary disease. Electronically Signed   By: Lajean Manes M.D.   On: 04/20/2018 13:09   Mr Brain Wo Contrast  Result Date: 04/04/2018 CLINICAL DATA:  New daily persistent headache. History of thyroid and renal carcinoma. EXAM: MRI HEAD WITHOUT CONTRAST TECHNIQUE: Multiplanar, multiecho pulse sequences of the brain and surrounding structures were obtained without intravenous contrast. COMPARISON:  None. FINDINGS: Brain: No infarction, hemorrhage, hydrocephalus, extra-axial collection or mass affect. Rare FLAIR hyperintensities in the cerebral white matter, allowable for age. Small sessile nodular density in the  left middle cranial fossa is likely dural ossification, which is seen extensively on gradient imaging. Vascular: Major flow voids are preserved. Skull and upper cervical spine: Negative for marrow lesion Sinuses/Orbits: Negative IMPRESSION: Negative exam.  No specific explanation for headache. Electronically Signed   By: Monte Fantasia M.D.   On: 04/04/2018 13:46    Microbiology: No results found for this or any previous visit (from the past 240 hour(s)).   Labs: Basic Metabolic Panel: Recent Labs  Lab 04/20/18 1209 04/20/18 1548 04/21/18 0451  NA 141  --  142  K 2.7*  --  4.5  CL 106  --  111  CO2 27  --  27  GLUCOSE 169*  --  111*  BUN 10  --  10  CREATININE 0.72  --  0.61  CALCIUM 9.1  --  8.9  MG  --  2.0  --    Liver Function Tests: No results for input(s): AST, ALT, ALKPHOS, BILITOT, PROT, ALBUMIN in the last 168 hours. No results for input(s): LIPASE, AMYLASE in the last 168 hours. No results for input(s): AMMONIA in the last 168 hours. CBC: Recent Labs   Lab 04/20/18 1209 04/21/18 0451  WBC 6.1 7.4  HGB 11.5* 10.5*  HCT 37.3 34.9*  MCV 89.7 90.9  PLT 162 160   Cardiac Enzymes: Recent Labs  Lab 04/20/18 1209 04/20/18 1548 04/20/18 1919 04/20/18 2234  TROPONINI <0.03 <0.03 <0.03 <0.03   BNP: BNP (last 3 results) No results for input(s): BNP in the last 8760 hours.  ProBNP (last 3 results) No results for input(s): PROBNP in the last 8760 hours.  CBG: Recent Labs  Lab 04/20/18 1208 04/20/18 1522  GLUCAP 179* 51*       Signed:  Lelon Frohlich  [facility] Pager: [phone] 04/21/2018, 4:28 PM

## 2018-04-21 NOTE — Progress Notes (Signed)
*  PRELIMINARY RESULTS* Echocardiogram 2D Echocardiogram has been performed.  Leavy Cella 04/21/2018, 1:55 PM

## 2018-04-21 NOTE — Progress Notes (Signed)
Patient IV removed and site intact. Patient discharged with personal belongings and AVS.

## 2018-04-23 ENCOUNTER — Encounter: Payer: Self-pay | Admitting: Cardiology

## 2018-04-23 ENCOUNTER — Ambulatory Visit (INDEPENDENT_AMBULATORY_CARE_PROVIDER_SITE_OTHER): Payer: 59 | Admitting: Cardiology

## 2018-04-23 ENCOUNTER — Encounter

## 2018-04-23 VITALS — BP 137/76 | HR 74 | Ht 63.0 in | Wt 204.8 lb

## 2018-04-23 DIAGNOSIS — I951 Orthostatic hypotension: Secondary | ICD-10-CM

## 2018-04-23 DIAGNOSIS — R002 Palpitations: Secondary | ICD-10-CM | POA: Diagnosis not present

## 2018-04-23 DIAGNOSIS — R55 Syncope and collapse: Secondary | ICD-10-CM | POA: Diagnosis not present

## 2018-04-23 NOTE — Patient Instructions (Signed)
Your physician recommends that you schedule a follow-up appointment in: [place] has recommended you make the following change in your medication:   TAKE HCTZ 25 MG [place]  Your physician has recommended that you wear an event monitor FOR 7 DAYS. Event monitors are medical devices that record the heart's electrical activity. Doctors most often [place] these monitors to diagnose arrhythmias. Arrhythmias are problems with the speed or rhythm of the heartbeat. The monitor is a small, portable device. You can wear one while you do your normal daily activities. This is usually used to diagnose what is causing palpitations/syncope (passing out).  Thank you for choosing [facility]!!

## 2018-04-23 NOTE — Progress Notes (Signed)
Clinical Summary Ms. Salamone is a 62 y.o.female    1. Syncope - she reports approximately 8 episodes starting in July. Over the last few months her husband had taken ill and recently passed away, the onset of her syncopal episodes correlates with recent severe stress related to this.  - first episode July. Occurred just after coming home from hospital with her husband. She was standing make the bed when she lost consciousness. Denies any prodrome. Does not remember any specific symptoms prior. LOC for a few minutes.  - second episode a few weeks later. Occurred while walking out of church helping her husband. Does not recall prodrome at that time either - episode 2 weeks ago while laying in hospital bed, after standing had episode. At that time had some mild chest pain prior. -  completed echo that showed VLEF 55% , grade I diastolic dysfunction, no significant valvular pathology. We also decreased her losartan to 50mg  daily and changed her HCTZ to prn only.   - ER visit 08/2017 with presyncope.  - occurred at church. Standing while singing. Felt off balance, unsteady. Went partially to ground.  - water x 4, 1 cup coffee, occasional sodas, no energy drinks, occasional tea, no EtOH. - can have intermittent diarrhea vs constipation.  - orthostatic in clinic today previuosly, DBP drops 13 points with sitting up.   - has compression stockings. Back on HCTZ 25mg  daily by her endocrinlogist. Severe swelling on the medicine, was not controlled with prn lasix - ongoing orthostatic dizzienss.     2. Chronic pain - followed in pain clinic for right sided chest wall pain.    3. Chest pain - admit 04/2018 with chest pain - tropononins negative, EKG independently revewed and without acute ischemic changes. CXR no acute process - 04/2018 echo LVEF 23-30%, grade I diastolic dysfunction   - ongoing about 1 month. Feeling of heart racing, squeezing feeling. Would occur at any time. Very  diaphoretic. No SOB. Would last a few minutes. Occurs daily. Ongoing symptoms - 1 cup of coffee, rare sodas, occasional tea, no energy drinks, no alcholol - K was 2.7. Even after being replaced palpitations have continued.   Past Medical History:  Diagnosis Date  . Anemia   . Arthritis   . Asthma    seasonal asthma rarely   . Chronic kidney disease    renal mass right kidney   . CPAP (continuous positive airway pressure) dependence   . Diverticulitis   . GERD (gastroesophageal reflux disease)   . Hemorrhoids   . History of kidney stones   . Hyperlipemia   . Hypertension   . Migraine   . 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 PONV (postoperative nausea and vomiting)    patient reports being "slower to wake than average "   . Pre-diabetes    "ive been told i'm borderline"   . Sleep apnea    CPAP use      Allergies  Allergen Reactions  . Iodinated Diagnostic Agents Hives and Itching  . Isovue [Iopamidol] Itching    And hives   . Nitrofurantoin Hives  . Other Hives, Itching, Rash and Other (See Comments)    Use Paper tape ONLY  . Sulfa Antibiotics Hives  . Adhesive [Tape] Rash and Other (See Comments)    Use Paper tape ONLY  . Mango Flavor Swelling and Rash    Tongue swelling, feels like throat is closing in  on her      Current Outpatient Medications  Medication Sig Dispense Refill  . baclofen (LIORESAL) 10 MG tablet Take 10 mg by mouth at bedtime.    . calcium carbonate (OS-CAL - DOSED IN MG OF ELEMENTAL CALCIUM) 1250 (500 Ca) MG tablet Take 1 tablet (500 mg of elemental calcium total) by mouth 2 (two) times daily with a meal. 60 tablet 1  . dicyclomine (BENTYL) 10 MG capsule Take 1 capsule (10 mg total) by mouth 3 (three) times daily as needed for spasms. 60 capsule 2  . DULoxetine (CYMBALTA) 20 MG capsule Take 20 mg by mouth 2 (two) times daily.    . hydrochlorothiazide (HYDRODIURIL) 25 MG tablet Take 1 tablet (25 mg total)  by mouth daily. 30 tablet 2  . levothyroxine (SYNTHROID, LEVOTHROID) 100 MCG tablet Take 1 tablet (100 mcg total) by mouth daily before breakfast. 60 tablet 1  . losartan (COZAAR) 100 MG tablet Take 1 tablet (100 mg total) by mouth daily. 90 tablet 1  . ondansetron (ZOFRAN ODT) 4 MG disintegrating tablet Take 1 tablet (4 mg total) every 8 (eight) hours as needed by mouth for nausea or vomiting. 4mg  ODT q4 hours prn nausea/vomit 30 tablet 0  . pantoprazole (PROTONIX) 40 MG tablet Take 1 tablet (40 mg total) by mouth daily before breakfast. 90 tablet 3  . potassium chloride SA (K-DUR,KLOR-CON) 20 MEQ tablet Take 20 mEq by mouth daily.     . rosuvastatin (CRESTOR) 5 MG tablet Take 5 mg by mouth daily.      No current facility-administered medications for this visit.      Past Surgical History:  Procedure Laterality Date  . ABDOMINAL HYSTERECTOMY    . APPENDECTOMY  1978  . BIOPSY  07/08/2017   Procedure: BIOPSY;  Surgeon: Rogene Houston, MD;  Location: [facility];  Service: Endoscopy;;  gastric  . CHOLECYSTECTOMY  2005  . COLONOSCOPY WITH PROPOFOL N/A 10/28/2017   Procedure: COLONOSCOPY WITH PROPOFOL;  Surgeon: Rogene Houston, MD;  Location: [facility];  Service: Endoscopy;  Laterality: N/A;  7:30  . ESOPHAGOGASTRODUODENOSCOPY N/A 07/08/2017   Procedure: ESOPHAGOGASTRODUODENOSCOPY (EGD);  Surgeon: Rogene Houston, MD;  Location: [facility];  Service: Endoscopy;  Laterality: N/A;  220  . NASAL SEPTUM SURGERY    . ROBOTIC ASSITED PARTIAL NEPHRECTOMY Right 05/17/2017   Procedure: XI ROBOTIC ASSITED PARTIAL NEPHRECTOMY;  Surgeon: Alexis Frock, MD;  Location: [facility];  Service: Urology;  Laterality: Right;  . THYROIDECTOMY N/A 03/10/2018   Procedure: TOTAL THYROIDECTOMY;  Surgeon: Aviva Signs, MD;  Location: [facility];  Service: General;  Laterality: N/A;     Allergies  Allergen Reactions  . Iodinated Diagnostic Agents Hives and Itching  . Isovue [Iopamidol] Itching    And hives     . Nitrofurantoin Hives  . Other Hives, Itching, Rash and Other (See Comments)    Use Paper tape ONLY  . Sulfa Antibiotics Hives  . Adhesive [Tape] Rash and Other (See Comments)    Use Paper tape ONLY  . Mango Flavor Swelling and Rash    Tongue swelling, feels like throat is closing in on her       Family History  Problem Relation Age of Onset  . COPD Mother   . Diabetes Mother   . Hypertension Mother   . High Cholesterol Mother   . Liver cancer Mother   . COPD Sister   . Hypertension Brother   . High Cholesterol Brother   .  Hypertension Brother   . Asthma Son      Social History Ms. Kirkland reports that she has never smoked. She has never used smokeless tobacco. Ms. Ringstad reports that she drinks alcohol.   Review of Systems CONSTITUTIONAL: No weight loss, fever, chills, weakness or fatigue.  HEENT: Eyes: No visual loss, blurred vision, double vision or yellow sclerae.No hearing loss, sneezing, congestion, runny nose or sore throat.  SKIN: No rash or itching.  CARDIOVASCULAR: per hpi RESPIRATORY: No shortness of breath, cough or sputum.  GASTROINTESTINAL: No anorexia, nausea, vomiting or diarrhea. No abdominal pain or blood.  GENITOURINARY: No burning on urination, no polyuria NEUROLOGICAL: No headache, dizziness, syncope, paralysis, ataxia, numbness or tingling in the extremities. No change in bowel or bladder control.  MUSCULOSKELETAL: No muscle, back pain, joint pain or stiffness.  LYMPHATICS: No enlarged nodes. No history of splenectomy.  PSYCHIATRIC: No history of depression or anxiety.  ENDOCRINOLOGIC: No reports of sweating, cold or heat intolerance. No polyuria or polydipsia.  Marland Kitchen   Physical Examination Vitals:   04/23/18 0957  BP: 137/76  Pulse: 74  SpO2: 97%   Vitals:   04/23/18 0957  Weight: 204 lb 12.8 oz (92.9 kg)  Height: 5\' 3"  (1.6 m)    Gen: resting comfortably, no acute distress HEENT: no scleral icterus, pupils equal round and reactive,  no palptable cervical adenopathy,  CV: RRR, no mrg, no jvd Resp: Clear to auscultation bilaterally GI: abdomen is soft, non-tender, non-distended, normal bowel sounds, no hepatosplenomegaly MSK: extremities are warm, no edema.  Skin: warm, no rash Neuro:  no focal deficits Psych: appropriate affect   Diagnostic Studies Echo 07/2014  Study Conclusions  - Left ventricle: The cavity size was normal. Wall thickness was normal. The estimated ejection fraction was 55%. Wall motion was normal; there were no regional wall motion abnormalities. Doppler parameters are consistent with abnormal left ventricular relaxation (grade 1 diastolic dysfunction). - Aortic valve: There was trivial regurgitation. - Mitral valve: Mildly thickened leaflets . Mild systolic bowing without prolapse, involving the anterior leaflet. There was mild regurgitation directed posteriorly. - Left atrium: The atrium was moderately dilated. - Right atrium: Central venous pressure (est): 3 mm Hg. - Atrial septum: The septum bowed from left to right, consistent with increased left atrial pressure. - Tricuspid valve: There was trivial regurgitation. - Pulmonary arteries: PA peak pressure: 23 mm Hg (S). - Pericardium, extracardiac: A prominent pericardial fat pad was present.  Impressions:  - Normal LV wall thickness and chamber size with LVEF approximately 46%, grade 1 diastolic dysfunction. Mildly thickened mitral leaflets with bowing of the anterior leaflet and mild posteriorly directed mitral regurgitation. Moderate left atrial enlargement. Trivial tricuspid regurgitation with PASP 23 mmHg.     Assessment and Plan  1. Syncope/Orthostatic hypotension.  - ongoing symptoms, we will change her HCTZ to every other day dosing. Continue aggressive hydration, compression stockings. Some recent diarrhea as well she is seeing GI for, may be playing a role.    2. Palpitations - obtain 7 day  event monitor. .    F/u 2 months   Arnoldo Lenis, M.D.

## 2018-04-24 ENCOUNTER — Other Ambulatory Visit (INDEPENDENT_AMBULATORY_CARE_PROVIDER_SITE_OTHER): Payer: Self-pay | Admitting: *Deleted

## 2018-04-24 DIAGNOSIS — R197 Diarrhea, unspecified: Secondary | ICD-10-CM

## 2018-04-24 NOTE — Progress Notes (Signed)
Patient had been seen in the ED 04/20/2018. Her potassium was low and she was told that this may have been related to the diarrhea she continues to have.  Patient called the office while in the hospital and then again on 04/23/2018. She continues to have diarrhea. Ask what are recommendations.  Per Dr.Rehman the patient needs to have GI Pathogen Panel and C-Diff. If the results are negative, patient may want to talk to the ordering physician about her thyroid medication. As patient states that the diarrhea started 1 week after being put on this medication.  Patient was called and made aware.

## 2018-04-30 ENCOUNTER — Ambulatory Visit (INDEPENDENT_AMBULATORY_CARE_PROVIDER_SITE_OTHER): Payer: 59

## 2018-04-30 DIAGNOSIS — R002 Palpitations: Secondary | ICD-10-CM

## 2018-04-30 LAB — GASTROINTESTINAL PATHOGEN PANEL PCR
C. difficile Tox A/B, PCR: NOT DETECTED
CRYPTOSPORIDIUM, PCR: NOT DETECTED
Campylobacter, PCR: NOT DETECTED
E COLI (ETEC) LT/ST, PCR: NOT DETECTED
E coli (STEC) stx1/stx2, PCR: NOT DETECTED
E coli 0157, PCR: NOT DETECTED
GIARDIA LAMBLIA, PCR: NOT DETECTED
Norovirus, PCR: NOT DETECTED
ROTAVIRUS, PCR: NOT DETECTED
SHIGELLA, PCR: NOT DETECTED
Salmonella, PCR: NOT DETECTED

## 2018-04-30 LAB — CLOSTRIDIUM DIFFICILE CULTURE-FECAL

## 2018-05-05 ENCOUNTER — Other Ambulatory Visit: Payer: Self-pay | Admitting: Cardiology

## 2018-05-07 ENCOUNTER — Encounter: Payer: Self-pay | Admitting: Family Medicine

## 2018-05-07 ENCOUNTER — Ambulatory Visit (INDEPENDENT_AMBULATORY_CARE_PROVIDER_SITE_OTHER): Payer: 59 | Admitting: Family Medicine

## 2018-05-07 VITALS — BP 135/82 | HR 73 | Temp 98.5°F | Ht 63.0 in | Wt 208.0 lb

## 2018-05-07 DIAGNOSIS — R197 Diarrhea, unspecified: Secondary | ICD-10-CM | POA: Diagnosis not present

## 2018-05-07 DIAGNOSIS — E876 Hypokalemia: Secondary | ICD-10-CM

## 2018-05-07 DIAGNOSIS — F329 Major depressive disorder, single episode, unspecified: Secondary | ICD-10-CM | POA: Diagnosis not present

## 2018-05-07 DIAGNOSIS — M545 Low back pain, unspecified: Secondary | ICD-10-CM

## 2018-05-07 DIAGNOSIS — Z09 Encounter for follow-up examination after completed treatment for conditions other than malignant neoplasm: Secondary | ICD-10-CM | POA: Diagnosis not present

## 2018-05-07 MED ORDER — DULOXETINE HCL 60 MG PO CPEP: 60.0000 mg | ORAL_CAPSULE | Freq: Every day | ORAL | 1 refills | Status: DC

## 2018-05-07 MED ORDER — METHYLPREDNISOLONE ACETATE 80 MG/ML IJ SUSP
80.0000 mg | Freq: Once | INTRAMUSCULAR | Status: AC
Start: 2018-05-07 — End: 2018-05-07
  Administered 2018-05-07: 80 mg via INTRAMUSCULAR

## 2018-05-07 NOTE — Patient Instructions (Addendum)
I have increased her Cymbalta to 60 mg daily.  You will take a single capsule every day.  See me back in about 4 to 6 weeks that we can recheck your symptoms.  Taking the medicine as directed and not missing any doses is one of the best things you can do to treat your depression.  Here are some things to keep in mind:  1) Side effects (stomach upset, some increased anxiety) may happen before you notice a benefit.  These side effects typically go away over time. 2) Changes to your dose of medicine or a change in medication all together is sometimes necessary 3) Most people need to be on medication at least 12 months 4) Many people will notice an improvement within two weeks but the full effect of the medication can take up to 4-6 weeks 5) Stopping the medication when you start feeling better often results in a return of symptoms 6) Never discontinue your medication without contacting a health care professional first.  Some medications require gradual discontinuation/ taper and can make you sick if you stop them abruptly.  If your symptoms worsen or you have thoughts of suicide/homicide, PLEASE SEEK IMMEDIATE MEDICAL ATTENTION.  You may always call:  National Suicide Hotline: 978-609-2722 [place]: (612)681-6136 Crisis Recovery in [place]: [phone]   These are available 24 hours a day, 7 days a week.    For your back pain, you were given a shot of steroid today.     Back Pain, Adult Back pain is very common. The pain often gets better over time. The cause of back pain is usually not dangerous. Most people can learn to manage their back pain on their own. Follow these instructions at home: Watch your back pain for any changes. The following actions may help to lessen any pain you are feeling:  Stay active. Start with short walks on flat ground if you can. Try to walk farther each day.  Exercise regularly as told by your doctor. Exercise helps your back heal  faster. It also helps avoid future injury by keeping your muscles strong and flexible.  Do not sit, drive, or stand in one place for more than 30 minutes.  Do not stay in bed. Resting more than 1-2 days can slow down your recovery.  Be careful when you bend or lift an object. Use good form when lifting: ? Bend at your knees. ? Keep the object close to your body. ? Do not twist.  Sleep on a firm mattress. Lie on your side, and bend your knees. If you lie on your back, put a pillow under your knees.  Take medicines only as told by your doctor.  Put ice on the injured area. ? Put ice in a plastic bag. ? Place a towel between your skin and the bag. ? Leave the ice on for 20 minutes, 2-3 times a day for the first 2-3 days. After that, you can switch between ice and heat packs.  Avoid feeling anxious or stressed. Find good ways to deal with stress, such as exercise.  Maintain a healthy weight. Extra weight puts stress on your back.  Contact a doctor if:  You have pain that does not go away with rest or medicine.  You have worsening pain that goes down into your legs or buttocks.  You have pain that does not get better in one week.  You have pain at night.  You lose weight.  You have a fever  or chills. Get help right away if:  You cannot control when you poop (bowel movement) or pee (urinate).  Your arms or legs feel weak.  Your arms or legs lose feeling (numbness).  You feel sick to your stomach (nauseous) or throw up (vomit).  You have belly (abdominal) pain.  You feel like you may pass out (faint). This information is not intended to replace advice given to you by your health care provider. Make sure you discuss any questions you have with your health care provider. Document Released: 02/13/2008 Document Revised: 02/02/2016 Document Reviewed: 12/29/2013 Elsevier Interactive Patient Education  [employer].

## 2018-05-07 NOTE — Addendum Note (Signed)
Addended by: Rolena Infante on: 05/07/2018 11:11 AM   Modules accepted: Orders

## 2018-05-07 NOTE — Progress Notes (Signed)
Subjective: CC: Hospital follow up PCP: Janora Norlander, DO Rebecca Scott is a 62 y.o. female presenting to clinic today for:  1. Hospital follow up Patient was admitted to the hospital on 04/20/2018 and discharged on 04/21/2018 for chest pain.  She had ACS rule out with serial troponins and unchanged EKG.  She was advised to follow-up with cardiology if she were to have recurrent symptoms in the future.  She saw her cardiologist on 04/23/2018, who changed her HCTZ to every other day dosing.  They also placed her on a 7-day event monitor.  Additionally, she was noted to have hypokalemia thought to be secondary to GI losses.  This was repleted.  Magnesium was normal during that hospitalization.  She had C. difficile culture and GI pathogen panel performed by her gastroenterologist which was negative.  There is question if her thyroid replacement may be playing a role in her diarrhea since onset seem to be congruent with initiation of this medication.  She notes that she continues to have several episodes of diarrhea.  She had 3 episodes today.  No hematochezia or melena.  No associated nausea or vomiting.  She notes that she took the Bentyl and Imodium but did not feel like the Imodium was making much difference so she discontinued that medication.  She has not tried probiotics.  She does not eat yogurt.  2.  Low back pain Patient reports that she has been having low back pain for the last several days.  She points to the lumbosacral area more on the left than right.  She notes that it is fairly severe and she has actually increased her baclofen to 20 mg nightly in efforts to relieve it.  Pain appears to be worse with walking/standing.  3.  Depression Patient reports depressive symptoms, she is feeling overwhelmed by her multiple medical issues.  She has been on Cymbalta 20 mg twice daily for some time and thinks that she would benefit from augmentation of her medication.  No SI or  HI.   ROS: Per HPI  Allergies  Allergen Reactions  . Iodinated Diagnostic Agents Hives and Itching  . Isovue [Iopamidol] Itching    And hives   . Nitrofurantoin Hives  . Other Hives, Itching, Rash and Other (See Comments)    Use Paper tape ONLY  . Sulfa Antibiotics Hives  . Adhesive [Tape] Rash and Other (See Comments)    Use Paper tape ONLY  . Mango Flavor Swelling and Rash    Tongue swelling, feels like throat is closing in on her    Past Medical History:  Diagnosis Date  . Anemia   . Arthritis   . Asthma    seasonal asthma rarely   . Chronic kidney disease    renal mass right kidney   . CPAP (continuous positive airway pressure) dependence   . Diverticulitis   . GERD (gastroesophageal reflux disease)   . Hemorrhoids   . History of kidney stones   . Hyperlipemia   . Hypertension   . Migraine   . 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 PONV (postoperative nausea and vomiting)    patient reports being "slower to wake than average "   . Pre-diabetes    "ive been told i'm borderline"   . Sleep apnea    CPAP use     Current Outpatient Medications:  .  baclofen (LIORESAL) 10 MG tablet, Take 10 mg by mouth at  bedtime., Disp: , Rfl:  .  calcium carbonate (OS-CAL - DOSED IN MG OF ELEMENTAL CALCIUM) 1250 (500 Ca) MG tablet, Take 1 tablet (500 mg of elemental calcium total) by mouth 2 (two) times daily with a meal., Disp: 60 tablet, Rfl: 1 .  dicyclomine (BENTYL) 10 MG capsule, Take 1 capsule (10 mg total) by mouth 3 (three) times daily as needed for spasms., Disp: 60 capsule, Rfl: 2 .  DULoxetine (CYMBALTA) 20 MG capsule, Take 20 mg by mouth 2 (two) times daily., Disp: , Rfl:  .  hydrochlorothiazide (HYDRODIURIL) 25 MG tablet, Take 1 tablet (25 mg total) by mouth daily., Disp: 30 tablet, Rfl: 2 .  levothyroxine (SYNTHROID, LEVOTHROID) 100 MCG tablet, Take 1 tablet (100 mcg total) by mouth daily before breakfast., Disp: 60 tablet,  Rfl: 1 .  losartan (COZAAR) 100 MG tablet, TAKE 1 TABLET ONCE DAILY., Disp: 30 tablet, Rfl: 6 .  ondansetron (ZOFRAN ODT) 4 MG disintegrating tablet, Take 1 tablet (4 mg total) every 8 (eight) hours as needed by mouth for nausea or vomiting. 4mg  ODT q4 hours prn nausea/vomit, Disp: 30 tablet, Rfl: 0 .  pantoprazole (PROTONIX) 40 MG tablet, Take 1 tablet (40 mg total) by mouth daily before breakfast., Disp: 90 tablet, Rfl: 3 .  potassium chloride SA (K-DUR,KLOR-CON) 20 MEQ tablet, Take 20 mEq by mouth daily. , Disp: , Rfl:  .  rosuvastatin (CRESTOR) 5 MG tablet, Take 5 mg by mouth daily. , Disp: , Rfl:  Social History   Socioeconomic History  . Marital status: Widowed    Spouse name: Not on file  . Number of children: Not on file  . Years of education: Not on file  . Highest education level: Not on file  Occupational History  . Not on file  Social Needs  . Financial resource strain: Not on file  . Food insecurity:    Worry: Not on file    Inability: Not on file  . Transportation needs:    Medical: Not on file    Non-medical: Not on file  Tobacco Use  . Smoking status: Never Smoker  . Smokeless tobacco: Never Used  Substance and Sexual Activity  . Alcohol use: Yes    Comment: rarely   . Drug use: No  . Sexual activity: Not Currently  Lifestyle  . Physical activity:    Days per week: Not on file    Minutes per session: Not on file  . Stress: Not on file  Relationships  . Social connections:    Talks on phone: Not on file    Gets together: Not on file    Attends religious service: Not on file    Active member of club or organization: Not on file    Attends meetings of clubs or organizations: Not on file    Relationship status: Not on file  . Intimate partner violence:    Fear of current or ex partner: Not on file    Emotionally abused: Not on file    Physically abused: Not on file    Forced sexual activity: Not on file  Other Topics Concern  . Not on file  Social  History Narrative  . Not on file   Family History  Problem Relation Age of Onset  . COPD Mother   . Diabetes Mother   . Hypertension Mother   . High Cholesterol Mother   . Liver cancer Mother   . COPD Sister   . Hypertension Brother   . High  Cholesterol Brother   . Hypertension Brother   . Asthma Son     Objective: Office vital signs reviewed. BP 135/82   Pulse 73   Temp 98.5 F (36.9 C) (Oral)   Ht 5\' 3"  (1.6 m)   Wt 208 lb (94.3 kg)   BMI 36.85 kg/m   Physical Examination:  General: Awake, alert, No acute distress HEENT: Normal, sclera white, MMM Pulm: normal work of breathing on room air MSK: antalgic gait and station  Lumbar spine: Tenderness to palpation along the lumbosacral joint left greater than right.  No palpable bony abnormalities.  Patient is able to ambulate independently move all extremities without difficulty. Psych: Tearful, mood depressed, affect appropriate, good eye contact, does not appear to be responding to internal stimuli. Depression screen [facility] 2/9 05/07/2018 04/03/2018 03/26/2018  Decreased Interest 1 1 1   Down, Depressed, Hopeless 1 1 1   PHQ - 2 Score 2 2 2   Altered sleeping 3 3 2   Tired, decreased energy 3 3 3   Change in appetite 0 0 1  Feeling bad or failure about yourself  0 0 0  Trouble concentrating 2 2 1   Moving slowly or fidgety/restless 0 0 0  Suicidal thoughts 0 0 0  PHQ-9 Score 10 10 9   Difficult doing work/chores - Somewhat difficult Somewhat difficult    Assessment/ Plan: 62 y.o. female   1. Hypokalemia Potassium within normal limits at discharge.  Will recheck today given persistent diarrhea.  I discussed using probiotics.  Also considered rifaximin, though I am not sure if patient would be an appropriate candidate for this medication.  Will CC this chart to her gastroenterologist for their input.  I also discussed with her considering decreasing her dose of Synthroid to 88 mcg daily.  Her TSH is within normal limits but is on  the lower side of normal.  Perhaps a small adjustment might improve her symptoms.  She was reluctant to do this because she is upcoming labs next week and wanted to hold off.  Will defer this to her endocrinologist. - Basic Metabolic Panel  2. Hospital discharge follow-up Her discharge summary and labs were reviewed.  3. Diarrhea, unspecified type Infectious etiology ruled out by gastroenterology.  Possibly related to thyroid. ?  Malabsorption versus gastro intestinal flora imbalance. - Basic Metabolic Panel  4. Reactive depression Patient is tearful during today's exam.  We discussed options including watchful waiting versus increasing her Cymbalta.  I have increased her to 60 mg daily for depression.  Hopefully this will also help with her chronic pain.  We discussed the side effects of the medication.  She will see me back in 4 to 6 weeks or sooner if needed.  5. Acute bilateral low back pain without sciatica She was given a dose of Depo-Medrol 80 here in office.  Cymbalta increased as above.  Continue to follow-up with pain management as directed.   Orders Placed This Encounter  Procedures  . Basic Metabolic Panel   Meds ordered this encounter  Medications  . DULoxetine (CYMBALTA) 60 MG capsule    Sig: Take 1 capsule (60 mg total) by mouth daily.    Dispense:  30 capsule    Refill:  [place], [place] [phone]

## 2018-05-08 ENCOUNTER — Encounter: Payer: Self-pay | Admitting: Family Medicine

## 2018-05-08 LAB — BASIC METABOLIC PANEL
BUN/Creatinine Ratio: 15 (ref 12–28)
BUN: 11 mg/dL (ref 8–27)
CALCIUM: 9.4 mg/dL (ref 8.7–10.3)
CHLORIDE: 106 mmol/L (ref 96–106)
CO2: 22 mmol/L (ref 20–29)
Creatinine, Ser: 0.73 mg/dL (ref 0.57–1.00)
GFR calc non Af Amer: 89 mL/min/{1.73_m2} (ref >59)
GFR, EST AFRICAN AMERICAN: 103 mL/min/{1.73_m2} (ref >59)
GLUCOSE: 152 mg/dL — AB (ref 65–99)
POTASSIUM: 3.5 mmol/L (ref 3.5–5.2)
Sodium: 142 mmol/L (ref 134–144)

## 2018-05-12 ENCOUNTER — Encounter: Payer: Self-pay | Admitting: Family Medicine

## 2018-05-13 ENCOUNTER — Other Ambulatory Visit: Payer: Self-pay | Admitting: Family Medicine

## 2018-05-13 ENCOUNTER — Encounter: Payer: Self-pay | Admitting: Family Medicine

## 2018-05-13 ENCOUNTER — Other Ambulatory Visit (INDEPENDENT_AMBULATORY_CARE_PROVIDER_SITE_OTHER): Payer: 59

## 2018-05-13 DIAGNOSIS — M5441 Lumbago with sciatica, right side: Secondary | ICD-10-CM

## 2018-05-13 DIAGNOSIS — M5442 Lumbago with sciatica, left side: Principal | ICD-10-CM

## 2018-05-14 ENCOUNTER — Other Ambulatory Visit: Payer: Self-pay | Admitting: Family Medicine

## 2018-05-14 DIAGNOSIS — M5136 Other intervertebral disc degeneration, lumbar region: Secondary | ICD-10-CM

## 2018-05-19 LAB — THYROID PEROXIDASE ANTIBODY: THYROID PEROXIDASE ANTIBODY: 2 [IU]/mL (ref <9)

## 2018-05-19 LAB — THYROGLOBULIN ANTIBODY

## 2018-05-19 LAB — TSH: TSH: 1.28 mIU/L (ref 0.40–4.50)

## 2018-05-19 LAB — THYROGLOBULIN LEVEL: Thyroglobulin: 0.4 ng/mL — ABNORMAL LOW

## 2018-05-19 LAB — T4, FREE: Free T4: 1.1 ng/dL (ref 0.8–1.8)

## 2018-05-20 ENCOUNTER — Telehealth: Payer: Self-pay | Admitting: *Deleted

## 2018-05-20 NOTE — Telephone Encounter (Signed)
Pt aware - routed to pcp  

## 2018-05-20 NOTE — Telephone Encounter (Signed)
-----   Message from Arnoldo Lenis, MD sent at 05/19/2018  1:14 PM EDT ----- Normal heart monitor   Zandra Abts MD

## 2018-05-21 ENCOUNTER — Ambulatory Visit: Payer: 59 | Admitting: "Endocrinology

## 2018-05-27 ENCOUNTER — Ambulatory Visit (INDEPENDENT_AMBULATORY_CARE_PROVIDER_SITE_OTHER): Payer: 59 | Admitting: Orthopaedic Surgery

## 2018-05-27 ENCOUNTER — Encounter: Payer: Self-pay | Admitting: "Endocrinology

## 2018-05-27 ENCOUNTER — Encounter (INDEPENDENT_AMBULATORY_CARE_PROVIDER_SITE_OTHER): Payer: Self-pay | Admitting: Orthopaedic Surgery

## 2018-05-27 ENCOUNTER — Ambulatory Visit (INDEPENDENT_AMBULATORY_CARE_PROVIDER_SITE_OTHER): Payer: 59 | Admitting: "Endocrinology

## 2018-05-27 VITALS — BP 132/84 | HR 60 | Ht 63.0 in | Wt 210.0 lb

## 2018-05-27 DIAGNOSIS — G8929 Other chronic pain: Secondary | ICD-10-CM | POA: Diagnosis not present

## 2018-05-27 DIAGNOSIS — C73 Malignant neoplasm of thyroid gland: Secondary | ICD-10-CM | POA: Diagnosis not present

## 2018-05-27 DIAGNOSIS — I10 Essential (primary) hypertension: Secondary | ICD-10-CM

## 2018-05-27 DIAGNOSIS — E890 Postprocedural hypothyroidism: Secondary | ICD-10-CM | POA: Diagnosis not present

## 2018-05-27 DIAGNOSIS — M545 Low back pain, unspecified: Secondary | ICD-10-CM

## 2018-05-27 MED ORDER — LEVOTHYROXINE SODIUM 100 MCG PO TABS: 100.0000 ug | ORAL_TABLET | Freq: Every day | ORAL | 6 refills | Status: DC

## 2018-05-27 NOTE — Progress Notes (Signed)
Endocrinology follow-up note                                            05/27/2018, 4:25 PM   Subjective:    Patient ID: Rebecca Scott, female    DOB: 15-Oct-1955, PCP Janora Norlander, DO   Past Medical History:  Diagnosis Date  . Anemia   . Arthritis   . Asthma    seasonal asthma rarely   . Chronic kidney disease    renal mass right kidney   . CPAP (continuous positive airway pressure) dependence   . Diverticulitis   . GERD (gastroesophageal reflux disease)   . Hemorrhoids   . History of kidney stones   . HTN (hypertension) 07/29/2014  . Hyperlipemia   . Hypertension   . Migraine   . Multinodular goiter 08/21/2017   Last Assessment & Plan:  She will need an f/u u/s of thyroid to reassess nodules. Repeat in 07/2018 Nodules are too small for biopsy now.  . 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 Papillary thyroid carcinoma ([place]) 03/10/2018  . PONV (postoperative nausea and vomiting)    patient reports being "slower to wake than average "   . Pre-diabetes    "ive been told i'm borderline"   . Sleep apnea    CPAP use    Past Surgical History:  Procedure Laterality Date  . ABDOMINAL HYSTERECTOMY    . APPENDECTOMY  1978  . BIOPSY  07/08/2017   Procedure: BIOPSY;  Surgeon: Rogene Houston, MD;  [facility];  Service: Endoscopy;;  gastric  . CHOLECYSTECTOMY  2005  . COLONOSCOPY WITH PROPOFOL N/A 10/28/2017   Procedure: COLONOSCOPY WITH PROPOFOL;  Surgeon: Rogene Houston, MD;  [facility];  Service: Endoscopy;  Laterality: N/A;  7:30  . ESOPHAGOGASTRODUODENOSCOPY N/A 07/08/2017   Procedure: ESOPHAGOGASTRODUODENOSCOPY (EGD);  Surgeon: Rogene Houston, MD;  [facility];  Service: Endoscopy;  Laterality: N/A;  220  . NASAL SEPTUM SURGERY    . ROBOTIC ASSITED PARTIAL NEPHRECTOMY Right 05/17/2017   Procedure: XI ROBOTIC ASSITED PARTIAL NEPHRECTOMY;  Surgeon: Alexis Frock, MD;   [facility];  Service: Urology;  Laterality: Right;  . THYROIDECTOMY N/A 03/10/2018   Procedure: TOTAL THYROIDECTOMY;  Surgeon: Aviva Signs, MD;  Location: [facility];  Service: General;  Laterality: N/A;   Social History   Socioeconomic History  . Marital status: Widowed    Spouse name: Not on file  . Number of children: Not on file  . Years of education: Not on file  . Highest education level: Not on file  Occupational History  . Not on file  Social Needs  . Financial resource strain: Not on file  . Food insecurity:    Worry: Not on file    Inability: Not on file  . Transportation needs:    Medical: Not on file    Non-medical: Not on file  Tobacco Use  . Smoking status: Never Smoker  . Smokeless tobacco: Never Used  Substance and Sexual Activity  . Alcohol use: Yes    Comment: rarely   . Drug use: No  . Sexual activity: Not Currently  Lifestyle  . Physical activity:    Days per week: Not on file    Minutes per session: Not on file  .  Stress: Not on file  Relationships  . Social connections:    Talks on phone: Not on file    Gets together: Not on file    Attends religious service: Not on file    Active member of club or organization: Not on file    Attends meetings of clubs or organizations: Not on file    Relationship status: Not on file  Other Topics Concern  . Not on file  Social History Narrative  . Not on file   Outpatient Encounter Medications as of 05/27/2018  Medication Sig  . baclofen (LIORESAL) 10 MG tablet Take 10 mg by mouth 2 (two) times daily as needed.  . calcium carbonate (OS-CAL - DOSED IN MG OF ELEMENTAL CALCIUM) 1250 (500 Ca) MG tablet Take 1 tablet (500 mg of elemental calcium total) by mouth 2 (two) times daily with a meal.  . clonazePAM (KLONOPIN) 0.5 MG tablet Take 0.5 mg by mouth 2 (two) times daily as needed.  . dicyclomine (BENTYL) 10 MG capsule Take 1 capsule (10 mg total) by mouth 3 (three) times daily as needed for spasms.  .  DULoxetine (CYMBALTA) 60 MG capsule Take 1 capsule (60 mg total) by mouth daily.  . hydrochlorothiazide (HYDRODIURIL) 25 MG tablet Take 1 tablet (25 mg total) by mouth daily.  Marland Kitchen levothyroxine (SYNTHROID) 100 MCG tablet Take 1 tablet (100 mcg total) by mouth daily before breakfast.  . losartan (COZAAR) 100 MG tablet TAKE 1 TABLET ONCE DAILY.  Marland Kitchen ondansetron (ZOFRAN ODT) 4 MG disintegrating tablet Take 1 tablet (4 mg total) every 8 (eight) hours as needed by mouth for nausea or vomiting. 4mg  ODT q4 hours prn nausea/vomit  . pantoprazole (PROTONIX) 40 MG tablet Take 1 tablet (40 mg total) by mouth daily before breakfast.  . potassium chloride SA (K-DUR,KLOR-CON) 20 MEQ tablet Take 20 mEq by mouth daily.   . rosuvastatin (CRESTOR) 5 MG tablet Take 5 mg by mouth daily.   . [DISCONTINUED] levothyroxine (SYNTHROID, LEVOTHROID) 100 MCG tablet Take 1 tablet (100 mcg total) by mouth daily before breakfast.   No facility-administered encounter medications on file as of 05/27/2018.    ALLERGIES: Allergies  Allergen Reactions  . Iodinated Diagnostic Agents Hives and Itching  . Isovue [Iopamidol] Itching    And hives   . Nitrofurantoin Hives  . Other Hives, Itching, Rash and Other (See Comments)    Use Paper tape ONLY  . Sulfa Antibiotics Hives  . Adhesive [Tape] Rash and Other (See Comments)    Use Paper tape ONLY  . Mango Flavor Swelling and Rash    Tongue swelling, feels like throat is closing in on her     VACCINATION STATUS: Immunization History  Administered Date(s) Administered  . Influenza-Unspecified 06/24/2017    HPI Rebecca Scott is 62 y.o. female who was seen in consultation for multinodular goiter with abnormal FNA during her last visit.   -She was sent for thyroidectomy which revealed bilateral follicular variant papillary thyroid carcinoma Pathologic Stage Classification (pTNM, AJCC 8th Edition): pT2(multifocal), pNX.  She underwent surgery on March 10, 2018.  She is currently on  levothyroxine 100 mcg p.o. every morning, reports compliance. -She is recovering from her surgery very well.  Denies dysphagia, odynophagia, voice change, nor shortness of breath.  -She was found to have multinodular goiter in November 2018.  At that time she was found to have 2.4 cm nodule in the 5 cm right lobe of thyroid and 1.5 cm nodule in the 4.3 cm  left lobe.  Repeat ultrasound in April 2019 showed thyroid remaining more or less the same size however the nodules were observed to grow to 2.7 cm on the right lobe and 1.7 cm in the left lobe.  Subsequently, earlier this month she underwent fine-needle aspiration of the right lobe nodule which showed follicular lesion of undetermined significance.  Molecular studies were not sent.   -Prior to her surgery, patient complained of  foreign body sensation and occasional choking in her neck while eating and breathing problem when she was laying on her back.   -She denies any exposure to neck radiation.  She denies family history of thyroid cancer, however multiple family members with what appears to be hypothyroidism.  She denies heat/cold intolerance.    -Her previsit thyroid function tests are consistent with appropriate replacement with thyroid hormone.    Review of Systems  Constitutional: + weight gain of 6 pounds,  no fatigue, no subjective hyperthermia, no subjective hypothermia Eyes: no blurry vision, no xerophthalmia ENT: no sore throat, + recent thyroidectomy,  - dysphagia , - odynophagia, no hoarseness Cardiovascular: no Chest Pain, no Shortness of Breath, no palpitations, no leg swelling Respiratory: no cough, - SOB Gastrointestinal: no Nausea/Vomiting/Diarhhea Musculoskeletal: no muscle/joint aches Skin: no rashes Neurological: no tremors, no numbness, no tingling, no dizziness Psychiatric: no depression, no anxiety  Objective:    BP 132/84   Pulse 60   Ht 5\' 3"  (1.6 m)   Wt 210 lb (95.3 kg)   BMI 37.20 kg/m   Wt Readings  from Last 3 Encounters:  05/27/18 210 lb (95.3 kg)  05/07/18 208 lb (94.3 kg)  04/23/18 204 lb 12.8 oz (92.9 kg)    Physical Exam  Constitutional: + Obese, not in acute distress, normal state of mind Eyes: PERRLA, EOMI, no exophthalmos ENT: moist mucous membranes, healing post thyroidectomy scar on anterior lower neck, no palpable nodular or mass lesions in the neck.  no gross cervical lymphadenopathy Cardiovascular: normal precordial activity, Regular Rate and Rhythm, no Murmur/Rubs/Gallops Respiratory:  adequate breathing efforts, no gross chest deformity, Clear to auscultation bilaterally Gastrointestinal: abdomen soft, Non -tender, No distension, Bowel Sounds present Musculoskeletal: no gross deformities, strength intact in all four extremities Skin: moist, warm, no rashes Neurological: no tremor with outstretched hands, Deep tendon reflexes normal in all four extremities.   CMP     Component Value Date/Time   NA 142 05/07/2018 1035   K 3.5 05/07/2018 1035   CL 106 05/07/2018 1035   CO2 22 05/07/2018 1035   GLUCOSE 152 (H) 05/07/2018 1035   GLUCOSE 111 (H) 04/21/2018 0451   BUN 11 05/07/2018 1035   CREATININE 0.73 05/07/2018 1035   CALCIUM 9.4 05/07/2018 1035   PROT 6.5 03/26/2018 1020   ALBUMIN 3.9 03/26/2018 1020   AST 15 03/26/2018 1020   ALT 17 03/26/2018 1020   ALKPHOS 104 03/26/2018 1020   BILITOT 0.3 03/26/2018 1020   GFRNONAA 89 05/07/2018 1035   GFRAA 103 05/07/2018 1035     Diabetic Labs (most recent): Lab Results  Component Value Date   HGBA1C 5.4 03/06/2018   HGBA1C 5.7 (H) 05/09/2017      Lab Results  Component Value Date   TSH 1.28 05/16/2018   TSH 0.638 04/20/2018   TSH 0.720 04/20/2018   TSH 1.464 08/11/2017   FREET4 1.1 05/16/2018   FREET4 0.68 08/11/2017    Diagnosis 1. Thyroid, lobectomy, left lobe and isthmus - PAPILLARY THYROID CARCINOMA, FOLLICULAR VARIANT, 1.3 CM. - TUMOR  CONFINED WITHIN THYROID CAPSULE. - MARGINS NOT  INVOLVED. 2. Thyroid, lobectomy, right lobe - PAPILLARY THYROID CARCINOMA, FOLLICULAR VARIANT, 2.6 CM. - TUMOR FOCALLY LESS THAN 0.1 CM FROM MARGIN.   Pathologic Stage Classification (pTNM, AJCC 8th Edition): pT2(multifocal), pNX.  Assessment & Plan:   1.  Follicular variant of papillary thyroid cancer  -She is status post negative her thyroidectomy on March 10, 2018 with Dr. Aviva Signs resulting in  Pathologic Stage Classification (pTNM, AJCC 8th Edition): pT2(multifocal), pNX.  -She will need adjuvant treatment with radioactive iodine remnant ablation followed by whole body scan which she agrees with. -This treatment and scan will be scheduled to be completed as soon as possible.  To avoid treatment withdrawal, we will be utilizing Thyrogen injections prior to dosing of I-131.  -I had a long discussion with her regarding the need for completion of initial multimodal therapy for papillary thyroid cancer-follicular variant and about the need for strict monitoring schedule for the next 5-10 years. -She agrees and this treatment activities will be initiated in [facility].  2.  Postsurgical hypothyroidism -This is a result of her recent total thyroidectomy. -Her previsit thyroid function tests are consistent with appropriate replacement with levothyroxine.  Complains of bloating as well as diarrhea and wishes to try the brand name levothyroxine. -I discussed and initiated Synthroid 100 mcg p.o. every morning.   - We discussed about correct intake of levothyroxine, at fasting, with water, separated by at least 30 minutes from breakfast, and separated by more than 4 hours from calcium, iron, multivitamins, acid reflux medications (PPIs). -Patient is made aware of the fact that thyroid hormone replacement is needed for life, dose to be adjusted by periodic monitoring of thyroid function tests.  3.  Hypertension: -She has responded to hydrochlorthiazide added to her losartan  with her blood pressure being on target at 132/84.   - I advised her  to maintain close follow up with Janora Norlander, DO for primary care needs.  - Time spent with the patient: 25 min, of which >50% was spent in reviewing her  current and  previous labs, recent surgery, pathology results, previous treatments, and medications doses and developing a plan for long-term care.  Rebecca Scott participated in the discussions, expressed understanding, and voiced agreement with the above plans.  All questions were answered to her satisfaction. she is encouraged to contact clinic should she have any questions or concerns prior to her return visit.   Follow up plan: Return in about 9 weeks (around 07/29/2018) for Follow up with Whole Body Scan w/Thyrogen, Follow up with Labs after I131 Therapy.   Glade Lloyd, MD [facility] [address] [place], [place] [address] Phone: [phone]  Fax: [phone]     05/27/2018, 4:25 PM  This note was partially dictated with voice recognition software. Similar sounding words can be transcribed inadequately or may not  be corrected upon review.

## 2018-05-27 NOTE — Progress Notes (Signed)
Office Visit Note   Patient: Rebecca Scott           Date of Birth: April 03, 1956           MRN: [id number] Visit Date: 05/27/2018              Requested by: Janora Norlander, DO [address] [place], [place] [address] PCP: Janora Norlander, DO   Assessment & Plan: Visit Diagnoses:  1. Chronic midline low back pain without sciatica     Plan: Patient has lumbar spondylosis.  We discussed multiple treatment options including weight loss and physical therapy and facet injections.  At this point patient has had chronic back pain.  We need to obtain an MRI of the lumbar spine to rule out structural abnormalities.  I will call the patient with the MRI results.  She is supposed to undergo treatment for her thyroid cancer in the near future.  Follow-Up Instructions: Return if symptoms worsen or fail to improve.   Orders:  No orders of the defined types were placed in this encounter.  No orders of the defined types were placed in this encounter.     Procedures: No procedures performed   Clinical Data: No additional findings.   Subjective: Chief Complaint  Patient presents with  . Lower Back - Pain    Rebecca Scott is a 62 year old female comes in with chronic low back pain without any consistent radiculopathy or radiation pain.  Denies any numbness and tingling.  Denies any bowel or bladder incontinence.  Denies any injuries.  Denies any constitutional symptoms.  She has a dry needling and physical therapy.  She has tried over-the-counter and prescription NSAIDs.  She takes baclofen at night with partial relief.   Review of Systems  Constitutional: Negative.   HENT: Negative.   Eyes: Negative.   Respiratory: Negative.   Cardiovascular: Negative.   Endocrine: Negative.   Musculoskeletal: Negative.   Neurological: Negative.   Hematological: Negative.   Psychiatric/Behavioral: Negative.   All other systems reviewed and are negative.    Objective: Vital Signs: There were  no vitals taken for this visit.  Physical Exam  Constitutional: She is oriented to person, place, and time. She appears well-developed and well-nourished.  HENT:  Head: Normocephalic and atraumatic.  Eyes: EOM are normal.  Neck: Neck supple.  Pulmonary/Chest: Effort normal.  Abdominal: Soft.  Neurological: She is alert and oriented to person, place, and time.  Skin: Skin is warm. Capillary refill takes less than 2 seconds.  Psychiatric: She has a normal mood and affect. Her behavior is normal. Judgment and thought content normal.  Nursing note and vitals reviewed.   Ortho Exam Low back exam no significant tenderness of the lumbar spine.  SI joints nontender.  Skin turgor is are mildly tender.  Negative sciatic tension signs. Specialty Comments:  No specialty comments available.  Imaging: No results found.   PMFS History: Patient Active Problem List   Diagnosis Date Noted  . Malignant neoplasm of thyroid gland ([place]) 05/27/2018  . Postsurgical hypothyroidism 05/27/2018  . Hypokalemia 04/20/2018  . Diarrhea 04/20/2018  . Hx of papillary thyroid carcinoma 03/26/2018  . S/P total thyroidectomy 03/10/2018  . Obstructive sleep apnea 03/05/2018  . Essential hypertension, benign 02/18/2018  . Aortic atherosclerosis ([place]) 01/24/2018  . History of renal cell carcinoma 12/03/2017  . Reactive depression 10/22/2017  . Prediabetes 08/21/2017  . Gastroesophageal reflux disease without esophagitis 07/05/2017  . Hyperlipidemia 07/29/2014  . Syncope 07/29/2014  .  Mixed stress and urge urinary incontinence 01/14/2014  . Recurrent UTI 01/14/2014  . Vaginal atrophy 01/14/2014  . Benign lesion of eyelid 02/07/2012   Past Medical History:  Diagnosis Date  . Anemia   . Arthritis   . Asthma    seasonal asthma rarely   . Chronic kidney disease    renal mass right kidney   . CPAP (continuous positive airway pressure) dependence   . Diverticulitis   . GERD (gastroesophageal reflux  disease)   . Hemorrhoids   . History of kidney stones   . HTN (hypertension) 07/29/2014  . Hyperlipemia   . Hypertension   . Migraine   . Multinodular goiter 08/21/2017   Last Assessment & Plan:  She will need an f/u u/s of thyroid to reassess nodules. Repeat in 07/2018 Nodules are too small for biopsy now.  . Papillary renal cell carcinoma ([place]) 05/2017   s/p partial nephrectomy, right  . Papillary thyroid carcinoma ([place]) 03/10/2018  . PONV (postoperative nausea and vomiting)    patient reports being "slower to wake than average "   . Pre-diabetes    "ive been told i'm borderline"   . Sleep apnea    CPAP use     Family History  Problem Relation Age of Onset  . COPD Mother   . Diabetes Mother   . Hypertension Mother   . High Cholesterol Mother   . Liver cancer Mother   . COPD Sister   . Hypertension Brother   . High Cholesterol Brother   . Hypertension Brother   . Asthma Son     Past Surgical History:  Procedure Laterality Date  . ABDOMINAL HYSTERECTOMY    . APPENDECTOMY  1978  . BIOPSY  07/08/2017   Procedure: BIOPSY;  Surgeon: Rogene Houston, MD;  [facility];  Service: Endoscopy;;  gastric  . CHOLECYSTECTOMY  2005  . COLONOSCOPY WITH PROPOFOL N/A 10/28/2017   Procedure: COLONOSCOPY WITH PROPOFOL;  Surgeon: Rogene Houston, MD;  [facility];  Service: Endoscopy;  Laterality: N/A;  7:30  . ESOPHAGOGASTRODUODENOSCOPY N/A 07/08/2017   Procedure: ESOPHAGOGASTRODUODENOSCOPY (EGD);  Surgeon: Rogene Houston, MD;  [facility];  Service: Endoscopy;  Laterality: N/A;  220  . NASAL SEPTUM SURGERY    . ROBOTIC ASSITED PARTIAL NEPHRECTOMY Right 05/17/2017   Procedure: XI ROBOTIC ASSITED PARTIAL NEPHRECTOMY;  Surgeon: Alexis Frock, MD;  [facility];  Service: Urology;  Laterality: Right;  . THYROIDECTOMY N/A 03/10/2018   Procedure: TOTAL THYROIDECTOMY;  Surgeon: Aviva Signs, MD;  Location: [facility];  Service: General;  Laterality: N/A;    Social History   Occupational History  . Not on file  Tobacco Use  . Smoking status: Never Smoker  . Smokeless tobacco: Never Used  Substance and Sexual Activity  . Alcohol use: Yes    Comment: rarely   . Drug use: No  . Sexual activity: Not Currently

## 2018-05-27 NOTE — Addendum Note (Signed)
Addended by: Precious Bard on: 05/27/2018 01:41 PM   Modules accepted: Orders

## 2018-06-03 ENCOUNTER — Ambulatory Visit (HOSPITAL_COMMUNITY)
Admission: RE | Admit: 2018-06-03 | Discharge: 2018-06-03 | Disposition: A | Discharge status: Home / Self Care | Payer: 59 | Source: Ambulatory Visit | Attending: Orthopaedic Surgery | Admitting: Orthopaedic Surgery

## 2018-06-03 DIAGNOSIS — G8929 Other chronic pain: Secondary | ICD-10-CM | POA: Diagnosis present

## 2018-06-03 DIAGNOSIS — M5126 Other intervertebral disc displacement, lumbar region: Secondary | ICD-10-CM | POA: Insufficient documentation

## 2018-06-03 DIAGNOSIS — M545 Low back pain: Secondary | ICD-10-CM

## 2018-06-03 DIAGNOSIS — M48061 Spinal stenosis, lumbar region without neurogenic claudication: Secondary | ICD-10-CM | POA: Insufficient documentation

## 2018-06-03 DIAGNOSIS — M5136 Other intervertebral disc degeneration, lumbar region: Secondary | ICD-10-CM | POA: Diagnosis not present

## 2018-06-06 ENCOUNTER — Encounter: Payer: Self-pay | Admitting: Family Medicine

## 2018-06-06 ENCOUNTER — Ambulatory Visit (INDEPENDENT_AMBULATORY_CARE_PROVIDER_SITE_OTHER): Payer: 59 | Admitting: Family Medicine

## 2018-06-06 VITALS — BP 136/76 | HR 68 | Temp 98.4°F | Ht 63.0 in | Wt 207.0 lb

## 2018-06-06 DIAGNOSIS — R109 Unspecified abdominal pain: Secondary | ICD-10-CM

## 2018-06-06 DIAGNOSIS — M5136 Other intervertebral disc degeneration, lumbar region: Secondary | ICD-10-CM | POA: Diagnosis not present

## 2018-06-06 DIAGNOSIS — Z23 Encounter for immunization: Secondary | ICD-10-CM

## 2018-06-06 DIAGNOSIS — M5137 Other intervertebral disc degeneration, lumbosacral region: Secondary | ICD-10-CM | POA: Insufficient documentation

## 2018-06-06 DIAGNOSIS — I10 Essential (primary) hypertension: Secondary | ICD-10-CM

## 2018-06-06 DIAGNOSIS — M51379 Other intervertebral disc degeneration, lumbosacral region without mention of lumbar back pain or lower extremity pain: Secondary | ICD-10-CM

## 2018-06-06 LAB — BASIC METABOLIC PANEL
BUN/Creatinine Ratio: 14 (ref 12–28)
BUN: 11 mg/dL (ref 8–27)
CHLORIDE: 102 mmol/L (ref 96–106)
CO2: 23 mmol/L (ref 20–29)
Calcium: 9.3 mg/dL (ref 8.7–10.3)
Creatinine, Ser: 0.79 mg/dL (ref 0.57–1.00)
GFR calc non Af Amer: 81 mL/min/{1.73_m2} (ref >59)
GFR, EST AFRICAN AMERICAN: 93 mL/min/{1.73_m2} (ref >59)
Glucose: 134 mg/dL — ABNORMAL HIGH (ref 65–99)
POTASSIUM: 3.7 mmol/L (ref 3.5–5.2)
Sodium: 139 mmol/L (ref 134–144)

## 2018-06-06 LAB — URINALYSIS, COMPLETE
BILIRUBIN UA: NEGATIVE
Glucose, UA: NEGATIVE
KETONES UA: NEGATIVE
LEUKOCYTES UA: NEGATIVE
NITRITE UA: NEGATIVE
Protein, UA: NEGATIVE
RBC UA: NEGATIVE
SPEC GRAV UA: 1.015 (ref 1.005–1.030)
Urobilinogen, Ur: 0.2 mg/dL (ref 0.2–1.0)
pH, UA: 7 (ref 5.0–7.5)

## 2018-06-06 LAB — MICROSCOPIC EXAMINATION
Bacteria, UA: NONE SEEN
RBC, UA: NONE SEEN /hpf (ref 0–2)
WBC, UA: NONE SEEN /hpf (ref 0–5)

## 2018-06-06 MED ORDER — HYDROCHLOROTHIAZIDE 25 MG PO TABS: 25.0000 mg | ORAL_TABLET | Freq: Every day | ORAL | 1 refills | Status: DC

## 2018-06-06 MED ORDER — LOSARTAN POTASSIUM 100 MG PO TABS: 100.0000 mg | ORAL_TABLET | Freq: Every day | ORAL | 1 refills | Status: DC

## 2018-06-06 NOTE — Patient Instructions (Signed)
You had labs performed today.  You will be contacted with the results of the labs once they are available, usually in the next 3 business days for routine lab work.    Thoracic Strain Thoracic strain is an injury to the muscles or tendons that attach to the upper back. A strain can be mild or severe. A mild strain may take only 1-2 weeks to heal. A severe strain involves torn muscles or tendons, so it may take 6-8 weeks to heal. Follow these instructions at home:  Rest as needed. Limit your activity as told by your doctor.  If directed, put ice on the injured area: ? Put ice in a plastic bag. ? Place a towel between your skin and the bag. ? Leave the ice on for 20 minutes, 2-3 times per day.  Take over-the-counter and prescription medicines only as told by your doctor.  Begin doing exercises as told by your doctor or physical therapist.  Warm up before being active.  Bend your knees before you lift heavy objects.  Keep all follow-up visits as told by your doctor. This is important. Contact a doctor if:  Your pain is not helped by medicine.  Your pain, bruising, or swelling is getting worse.  You have a fever. Get help right away if:  You have shortness of breath.  You have chest pain.  You have weakness or loss of feeling (numbness) in your legs.  You cannot control when you pee (urinate). This information is not intended to replace advice given to you by your health care provider. Make sure you discuss any questions you have with your health care provider. Document Released: 02/13/2008 Document Revised: 04/28/2016 Document Reviewed: 10/21/2014 Elsevier Interactive Patient Education  [employer].

## 2018-06-06 NOTE — Progress Notes (Signed)
Subjective: CC: Chronic back pain PCP: Janora Norlander, DO HFW:YOVZCHY C Collar is a 62 y.o. female presenting to clinic today for:  1.  Chronic back pain Patient is reporting chronic back pain.  She is currently followed by orthopedics for this.  She had an MRI recently which revealed mild disc degeneration and bulging with a multiple levels of the lumbar spine.  She has yet to schedule a follow-up with orthopedics.  Currently, back pain is under fair control with medications prescribed by their office, including baclofen.  2.  Right-sided thoracic pain Patient with chronic history of right-sided pain but she reports that recently, over the last several days, she has been having worsening right-sided pain.  She is unsure if she pulled something in the right side of her back or if she has something else going on in her kidney.  She denies any dysuria, hematuria, urinary frequency or urgency.  No fevers, chills.  She has intermittent nausea but she does not think that is related to this issue.  She is urinating normally with normal volume.  Denies any frothy urine.  3.  Hypertension Patient reports compliance with both her losartan and hydrochlorothiazide.  Does not endorse any chest pain or shortness of breath.   ROS: Per HPI  Allergies  Allergen Reactions  . Iodinated Diagnostic Agents Hives and Itching  . Isovue [Iopamidol] Itching    And hives   . Nitrofurantoin Hives  . Other Hives, Itching, Rash and Other (See Comments)    Use Paper tape ONLY  . Sulfa Antibiotics Hives  . Adhesive [Tape] Rash and Other (See Comments)    Use Paper tape ONLY  . Mango Flavor Swelling and Rash    Tongue swelling, feels like throat is closing in on her    Past Medical History:  Diagnosis Date  . Anemia   . Arthritis   . Asthma    seasonal asthma rarely   . Chronic kidney disease    renal mass right kidney   . CPAP (continuous positive airway pressure) dependence   . Diverticulitis   .  GERD (gastroesophageal reflux disease)   . Hemorrhoids   . History of kidney stones   . HTN (hypertension) 07/29/2014  . Hyperlipemia   . Hypertension   . Migraine   . Multinodular goiter 08/21/2017   Last Assessment & Plan:  She will need an f/u u/s of thyroid to reassess nodules. Repeat in 07/2018 Nodules are too small for biopsy now.  . 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 Papillary thyroid carcinoma ([place]) 03/10/2018  . PONV (postoperative nausea and vomiting)    patient reports being "slower to wake than average "   . Pre-diabetes    "ive been told i'm borderline"   . Sleep apnea    CPAP use     Current Outpatient Medications:  .  baclofen (LIORESAL) 10 MG tablet, Take 10 mg by mouth 2 (two) times daily as needed., Disp: , Rfl:  .  calcium carbonate (OS-CAL - DOSED IN MG OF ELEMENTAL CALCIUM) 1250 (500 Ca) MG tablet, Take 1 tablet (500 mg of elemental calcium total) by mouth 2 (two) times daily with a meal., Disp: 60 tablet, Rfl: 1 .  clonazePAM (KLONOPIN) 0.5 MG tablet, Take 0.5 mg by mouth 2 (two) times daily as needed., Disp: , Rfl:  .  dicyclomine (BENTYL) 10 MG capsule, Take 1 capsule (10 mg total) by mouth 3 (three) times  daily as needed for spasms., Disp: 60 capsule, Rfl: 2 .  DULoxetine (CYMBALTA) 60 MG capsule, Take 1 capsule (60 mg total) by mouth daily., Disp: 30 capsule, Rfl: 1 .  hydrochlorothiazide (HYDRODIURIL) 25 MG tablet, Take 1 tablet (25 mg total) by mouth daily., Disp: 30 tablet, Rfl: 2 .  levothyroxine (SYNTHROID) 100 MCG tablet, Take 1 tablet (100 mcg total) by mouth daily before breakfast., Disp: 30 tablet, Rfl: 6 .  losartan (COZAAR) 100 MG tablet, TAKE 1 TABLET ONCE DAILY., Disp: 30 tablet, Rfl: 6 .  ondansetron (ZOFRAN ODT) 4 MG disintegrating tablet, Take 1 tablet (4 mg total) every 8 (eight) hours as needed by mouth for nausea or vomiting. 4mg  ODT q4 hours prn nausea/vomit, Disp: 30 tablet, Rfl: 0 .  pantoprazole  (PROTONIX) 40 MG tablet, Take 1 tablet (40 mg total) by mouth daily before breakfast., Disp: 90 tablet, Rfl: 3 .  potassium chloride SA (K-DUR,KLOR-CON) 20 MEQ tablet, Take 20 mEq by mouth daily. , Disp: , Rfl:  .  rosuvastatin (CRESTOR) 5 MG tablet, Take 5 mg by mouth daily. , Disp: , Rfl:  Social History   Socioeconomic History  . Marital status: Widowed    Spouse name: Not on file  . Number of children: Not on file  . Years of education: Not on file  . Highest education level: Not on file  Occupational History  . Not on file  Social Needs  . Financial resource strain: Not on file  . Food insecurity:    Worry: Not on file    Inability: Not on file  . Transportation needs:    Medical: Not on file    Non-medical: Not on file  Tobacco Use  . Smoking status: Never Smoker  . Smokeless tobacco: Never Used  Substance and Sexual Activity  . Alcohol use: Yes    Comment: rarely   . Drug use: No  . Sexual activity: Not Currently  Lifestyle  . Physical activity:    Days per week: Not on file    Minutes per session: Not on file  . Stress: Not on file  Relationships  . Social connections:    Talks on phone: Not on file    Gets together: Not on file    Attends religious service: Not on file    Active member of club or organization: Not on file    Attends meetings of clubs or organizations: Not on file    Relationship status: Not on file  . Intimate partner violence:    Fear of current or ex partner: Not on file    Emotionally abused: Not on file    Physically abused: Not on file    Forced sexual activity: Not on file  Other Topics Concern  . Not on file  Social History Narrative  . Not on file   Family History  Problem Relation Age of Onset  . COPD Mother   . Diabetes Mother   . Hypertension Mother   . High Cholesterol Mother   . Liver cancer Mother   . COPD Sister   . Hypertension Brother   . High Cholesterol Brother   . Hypertension Brother   . Asthma Son      Objective: Office vital signs reviewed. BP 136/76   Pulse 68   Temp 98.4 F (36.9 C) (Oral)   Ht 5\' 3"  (1.6 m)   Wt 207 lb (93.9 kg)   BMI 36.67 kg/m   Physical Examination:  General: Awake, alert, well  nourished, No acute distress HEENT: Normal, MMM, sclera white Cardio: regular rate  Pulm: normal work of breathing on room air Extremities: warm, well perfused, No edema, cyanosis or clubbing; +2 pulses bilaterally MSK: normal gait and station; Right sided CVA TTP. She has TTP throughout posterior ribs 8-10.  Assessment/ Plan: 62 y.o. female   1. Essential hypertension, benign Blood pressure controlled.  Refills on losartan and hydrochlorothiazide sent to pharmacy.  2. Acute right flank pain Not endorsing any urinary tract symptoms.  Will obtain UA and BMP to further evaluate.  Difficult to tell if this is related to underlying chronic right-sided rib pain or if related to renal system at this time. - Urinalysis, Complete - Basic Metabolic Panel  3. Degenerative disc disease at L5-S1 level I reviewed her orthopedist and pain management notes.  After her request, we reviewed the MRI report in office today.  However, I did discuss with her that this should be reviewed more in detail and interpreted by the orthopedist who ordered the study.  I suspect that they will interpret the MRI independently. Will defer management of chronic low back pain to orthopedist at this time.    4. Need for immunization against influenza Flu shot administered today.   Orders Placed This Encounter  Procedures  . Urinalysis, Complete  . Basic Metabolic Panel   Meds ordered this encounter  Medications  . hydrochlorothiazide (HYDRODIURIL) 25 MG tablet    Sig: Take 1 tablet (25 mg total) by mouth daily.    Dispense:  90 tablet    Refill:  1  . losartan (COZAAR) 100 MG tablet    Sig: Take 1 tablet (100 mg total) by mouth daily.    Dispense:  90 tablet    Refill:  [place], [place] [phone]

## 2018-06-08 ENCOUNTER — Encounter (INDEPENDENT_AMBULATORY_CARE_PROVIDER_SITE_OTHER): Payer: Self-pay | Admitting: Orthopaedic Surgery

## 2018-06-09 ENCOUNTER — Other Ambulatory Visit (INDEPENDENT_AMBULATORY_CARE_PROVIDER_SITE_OTHER): Payer: Self-pay

## 2018-06-09 DIAGNOSIS — G8929 Other chronic pain: Secondary | ICD-10-CM

## 2018-06-09 DIAGNOSIS — M545 Low back pain: Principal | ICD-10-CM

## 2018-06-09 NOTE — Telephone Encounter (Signed)
1. Please set her up for ESI with [facility] 2. Also she would like the MRI results to be released to her on mychart.  Thanks.

## 2018-06-11 ENCOUNTER — Encounter (HOSPITAL_COMMUNITY)
Admission: RE | Admit: 2018-06-11 | Discharge: 2018-06-11 | Disposition: A | Discharge status: Home / Self Care | Payer: 59 | Source: Ambulatory Visit | Attending: "Endocrinology | Admitting: "Endocrinology

## 2018-06-11 DIAGNOSIS — C73 Malignant neoplasm of thyroid gland: Secondary | ICD-10-CM

## 2018-06-11 MED ORDER — STERILE WATER FOR INJECTION IJ SOLN
INTRAMUSCULAR | Status: AC
  Administered 2018-06-11: 1 mL
  Filled 2018-06-11: qty 10

## 2018-06-11 MED ORDER — THYROTROPIN ALFA 1.1 MG IM SOLR
INTRAMUSCULAR | Status: AC
Start: 2018-06-11 — End: 2018-06-11
  Administered 2018-06-11: 0.9 mg via INTRAMUSCULAR
  Filled 2018-06-11: qty 0.9

## 2018-06-11 MED ORDER — THYROTROPIN ALFA 1.1 MG IM SOLR
0.9000 mg | INTRAMUSCULAR | Status: AC
  Administered 2018-06-11: 0.9 mg via INTRAMUSCULAR

## 2018-06-12 ENCOUNTER — Ambulatory Visit (HOSPITAL_COMMUNITY)
Admission: RE | Admit: 2018-06-12 | Discharge: 2018-06-12 | Disposition: A | Discharge status: Home / Self Care | Payer: 59 | Source: Ambulatory Visit | Attending: "Endocrinology | Admitting: "Endocrinology

## 2018-06-12 DIAGNOSIS — C73 Malignant neoplasm of thyroid gland: Secondary | ICD-10-CM

## 2018-06-12 MED ORDER — STERILE WATER FOR INJECTION IJ SOLN
INTRAMUSCULAR | Status: AC
  Filled 2018-06-12: qty 10

## 2018-06-12 MED ORDER — THYROTROPIN ALFA 1.1 MG IM SOLR
INTRAMUSCULAR | Status: AC
  Filled 2018-06-12: qty 0.9

## 2018-06-12 MED ORDER — THYROTROPIN ALFA 1.1 MG IM SOLR
0.9000 mg | INTRAMUSCULAR | Status: AC
  Administered 2018-06-12: 0.9 mg via INTRAMUSCULAR

## 2018-06-13 ENCOUNTER — Other Ambulatory Visit (HOSPITAL_COMMUNITY)
Admission: RE | Admit: 2018-06-13 | Discharge: 2018-06-13 | Disposition: A | Discharge status: Home / Self Care | Payer: 59 | Source: Ambulatory Visit | Attending: "Endocrinology | Admitting: "Endocrinology

## 2018-06-13 ENCOUNTER — Encounter (HOSPITAL_COMMUNITY)
Admission: RE | Admit: 2018-06-13 | Discharge: 2018-06-13 | Disposition: A | Discharge status: Home / Self Care | Payer: 59 | Source: Ambulatory Visit | Attending: "Endocrinology | Admitting: "Endocrinology

## 2018-06-13 ENCOUNTER — Other Ambulatory Visit (HOSPITAL_COMMUNITY): Payer: Self-pay | Admitting: Nurse Practitioner

## 2018-06-13 DIAGNOSIS — C73 Malignant neoplasm of thyroid gland: Secondary | ICD-10-CM | POA: Diagnosis present

## 2018-06-13 DIAGNOSIS — T50995D Adverse effect of other drugs, medicaments and biological substances, subsequent encounter: Secondary | ICD-10-CM

## 2018-06-13 DIAGNOSIS — E890 Postprocedural hypothyroidism: Secondary | ICD-10-CM | POA: Diagnosis present

## 2018-06-13 DIAGNOSIS — Z85528 Personal history of other malignant neoplasm of kidney: Secondary | ICD-10-CM

## 2018-06-13 LAB — TSH: TSH: 122.131 u[IU]/mL — AB (ref 0.350–4.500)

## 2018-06-13 LAB — T4, FREE: Free T4: 0.98 ng/dL (ref 0.82–1.77)

## 2018-06-13 MED ORDER — SODIUM IODIDE I 131 CAPSULE
70.0000 | Freq: Once | INTRAVENOUS | Status: AC | PRN
  Administered 2018-06-13: 66.7 via ORAL

## 2018-06-13 MED ORDER — DIPHENHYDRAMINE HCL 50 MG PO TABS: 50.0000 mg | ORAL_TABLET | Freq: Once | ORAL | 0 refills | Status: DC

## 2018-06-13 MED ORDER — PREDNISONE 50 MG PO TABS: ORAL_TABLET | ORAL | 0 refills | Status: DC

## 2018-06-14 LAB — THYROGLOBULIN ANTIBODY: Thyroglobulin Antibody: <1 IU/mL (ref 0.0–0.9)

## 2018-06-19 ENCOUNTER — Other Ambulatory Visit (INDEPENDENT_AMBULATORY_CARE_PROVIDER_SITE_OTHER): Payer: Self-pay | Admitting: Internal Medicine

## 2018-06-20 LAB — THYROGLOBULIN LEVEL

## 2018-06-23 ENCOUNTER — Ambulatory Visit (HOSPITAL_COMMUNITY)
Admission: RE | Admit: 2018-06-23 | Discharge: 2018-06-23 | Disposition: A | Discharge status: Home / Self Care | Payer: 59 | Source: Ambulatory Visit | Attending: "Endocrinology | Admitting: "Endocrinology

## 2018-06-23 DIAGNOSIS — C73 Malignant neoplasm of thyroid gland: Secondary | ICD-10-CM

## 2018-06-24 ENCOUNTER — Inpatient Hospital Stay (HOSPITAL_COMMUNITY): Payer: 59 | Attending: Hematology

## 2018-06-24 DIAGNOSIS — C73 Malignant neoplasm of thyroid gland: Secondary | ICD-10-CM | POA: Insufficient documentation

## 2018-06-24 DIAGNOSIS — Z85528 Personal history of other malignant neoplasm of kidney: Secondary | ICD-10-CM

## 2018-06-24 DIAGNOSIS — G894 Chronic pain syndrome: Secondary | ICD-10-CM | POA: Diagnosis not present

## 2018-06-24 DIAGNOSIS — Z905 Acquired absence of kidney: Secondary | ICD-10-CM | POA: Insufficient documentation

## 2018-06-24 DIAGNOSIS — R109 Unspecified abdominal pain: Secondary | ICD-10-CM | POA: Insufficient documentation

## 2018-06-24 DIAGNOSIS — E890 Postprocedural hypothyroidism: Secondary | ICD-10-CM | POA: Insufficient documentation

## 2018-06-24 LAB — CBC WITH DIFFERENTIAL/PLATELET
Abs Immature Granulocytes: 0.01 10*3/uL (ref 0.00–0.07)
BASOS ABS: 0 10*3/uL (ref 0.0–0.1)
Basophils Relative: 1 %
EOS PCT: 4 %
Eosinophils Absolute: 0.2 10*3/uL (ref 0.0–0.5)
HCT: 36.3 % (ref 36.0–46.0)
HEMOGLOBIN: 11 g/dL — AB (ref 12.0–15.0)
IMMATURE GRANULOCYTES: 0 %
LYMPHS ABS: 1.4 10*3/uL (ref 0.7–4.0)
LYMPHS PCT: 27 %
MCH: 28.1 pg (ref 26.0–34.0)
MCHC: 30.3 g/dL (ref 30.0–36.0)
MCV: 92.6 fL (ref 80.0–100.0)
Monocytes Absolute: 0.5 10*3/uL (ref 0.1–1.0)
Monocytes Relative: 10 %
NEUTROS PCT: 58 %
NRBC: 0 % (ref 0.0–0.2)
Neutro Abs: 3 10*3/uL (ref 1.7–7.7)
Platelets: 140 10*3/uL — ABNORMAL LOW (ref 150–400)
RBC: 3.92 MIL/uL (ref 3.87–5.11)
RDW: 16 % — ABNORMAL HIGH (ref 11.5–15.5)
WBC: 5.1 10*3/uL (ref 4.0–10.5)

## 2018-06-24 LAB — COMPREHENSIVE METABOLIC PANEL
ALT: 21 U/L (ref 0–44)
ANION GAP: 8 (ref 5–15)
AST: 17 U/L (ref 15–41)
Albumin: 3.8 g/dL (ref 3.5–5.0)
Alkaline Phosphatase: 72 U/L (ref 38–126)
BUN: 8 mg/dL (ref 8–23)
CHLORIDE: 107 mmol/L (ref 98–111)
CO2: 26 mmol/L (ref 22–32)
CREATININE: 0.74 mg/dL (ref 0.44–1.00)
Calcium: 9.1 mg/dL (ref 8.9–10.3)
Glucose, Bld: 94 mg/dL (ref 70–99)
POTASSIUM: 3.7 mmol/L (ref 3.5–5.1)
SODIUM: 141 mmol/L (ref 135–145)
Total Bilirubin: 0.8 mg/dL (ref 0.3–1.2)
Total Protein: 6.9 g/dL (ref 6.5–8.1)

## 2018-06-27 ENCOUNTER — Telehealth (HOSPITAL_COMMUNITY): Payer: Self-pay | Admitting: *Deleted

## 2018-06-30 ENCOUNTER — Encounter (HOSPITAL_COMMUNITY): Payer: Self-pay

## 2018-06-30 ENCOUNTER — Ambulatory Visit (HOSPITAL_COMMUNITY): Admission: RE | Admit: 2018-06-30 | Payer: 59 | Source: Ambulatory Visit

## 2018-06-30 ENCOUNTER — Other Ambulatory Visit (HOSPITAL_COMMUNITY): Payer: Self-pay | Admitting: Nurse Practitioner

## 2018-07-03 ENCOUNTER — Other Ambulatory Visit: Payer: Self-pay | Admitting: Family Medicine

## 2018-07-03 ENCOUNTER — Ambulatory Visit (INDEPENDENT_AMBULATORY_CARE_PROVIDER_SITE_OTHER): Payer: 59 | Admitting: Cardiology

## 2018-07-03 ENCOUNTER — Encounter: Payer: Self-pay | Admitting: Cardiology

## 2018-07-03 VITALS — BP 118/82 | HR 77 | Ht 63.0 in | Wt 205.2 lb

## 2018-07-03 DIAGNOSIS — I951 Orthostatic hypotension: Secondary | ICD-10-CM

## 2018-07-03 DIAGNOSIS — R002 Palpitations: Secondary | ICD-10-CM

## 2018-07-03 DIAGNOSIS — R55 Syncope and collapse: Secondary | ICD-10-CM | POA: Diagnosis not present

## 2018-07-03 MED ORDER — METOPROLOL TARTRATE 25 MG PO TABS: 12.5000 mg | ORAL_TABLET | Freq: Every day | ORAL | 1 refills | Status: DC

## 2018-07-03 MED ORDER — HYDROCHLOROTHIAZIDE 25 MG PO TABS: 25.0000 mg | ORAL_TABLET | ORAL | 1 refills | Status: DC

## 2018-07-03 NOTE — Patient Instructions (Signed)
Your physician wants you to follow-up in: [place] will receive a reminder letter in the mail two months in advance. If you don't receive a letter, please call our office to schedule the follow-up appointment.  Your physician has recommended you make the following change in your medication:   DECREASE HCTZ 1 TABLET EVERY OTHER DAY  START LOPRESSOR 12.5 MG (1/2 TABLET) DAILY AT BEDTIME   Thank you for choosing [facility]!!

## 2018-07-03 NOTE — Progress Notes (Signed)
Clinical Summary Rebecca Scott is a 62 y.o.female seen today for follow up of the following medical problems.    1. Syncope/Orthostatic hypotension - she reports approximately 8 episodes starting in July. Over the last few months her husband had taken ill and recently passed away, the onset of her syncopal episodes correlates with recent severe stress related to this.  - first episode July. Occurred just after coming home from hospital with her husband. She was standing make the bed when she lost consciousness. Denies any prodrome. Does not remember any specific symptoms prior. LOC for a few minutes.  - second episode a few weeks later. Occurred while walking out of church helping her husband. Does not recall prodrome at that time either - episode 2 weeks ago while laying in hospital bed, after standing had episode. At that time had some mild chest pain prior. - completed echo that showed VLEF 55% , grade I diastolic dysfunction, no significant valvular pathology. We also decreased her losartan to 50mg  daily and changed her HCTZ to prn only.   - ER visit 08/2017 with presyncope. - occurred at church. Standing while singing. Felt off balance, unsteady. Went partially to ground.  - water x 4, 1 cup coffee, occasional sodas, no energy drinks, occasional tea, no EtOH. - can have intermittent diarrhea vs constipation. - orthostatic in clinic previuosly, DBP drops 13 points with sitting up. - has compression stockings. Back on HCTZ 25mg  daily by her endocrinlogist. Severe swelling off the medicine, was not controlled with prn lasix   - still symptoms of dizziness with standing. No recurrent syncope. - stays well hydrated. Takes baclofen at night.  - ongoing diarrhea followed by GI. Started after she was started on synthroid after her thyroid surgery. Wears compression stockings when she goes out.     2. Chronic pain - followed in pain clinic for right sided chest wall  pain.   3. Palpitations - 04/2018 event monitor no significant arrhythmias - palpitations at night mainly since last visit, can keep her from sleeping.   4. Thyroid CA - followed by endocrine.  Past Medical History:  Diagnosis Date  . Anemia   . Arthritis   . Asthma    seasonal asthma rarely   . Chronic kidney disease    renal mass right kidney   . CPAP (continuous positive airway pressure) dependence   . Diverticulitis   . GERD (gastroesophageal reflux disease)   . Hemorrhoids   . History of kidney stones   . HTN (hypertension) 07/29/2014  . Hyperlipemia   . Hypertension   . Migraine   . Multinodular goiter 08/21/2017   Last Assessment & Plan:  She will need an f/u u/s of thyroid to reassess nodules. Repeat in 07/2018 Nodules are too small for biopsy now.  . 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 Papillary thyroid carcinoma ([place]) 03/10/2018  . PONV (postoperative nausea and vomiting)    patient reports being "slower to wake than average "   . Pre-diabetes    "ive been told i'm borderline"   . Sleep apnea    CPAP use      Allergies  Allergen Reactions  . Iodinated Diagnostic Agents Hives and Itching  . Isovue [Iopamidol] Itching    And hives   . Nitrofurantoin Hives  . Other Hives, Itching, Rash and Other (See Comments)    Use Paper tape ONLY  . Sulfa Antibiotics Hives  . Adhesive [  Tape] Rash and Other (See Comments)    Use Paper tape ONLY  . Mango Flavor Swelling and Rash    Tongue swelling, feels like throat is closing in on her      Current Outpatient Medications  Medication Sig Dispense Refill  . baclofen (LIORESAL) 10 MG tablet Take 10 mg by mouth 2 (two) times daily as needed.    . calcium carbonate (OS-CAL - DOSED IN MG OF ELEMENTAL CALCIUM) 1250 (500 Ca) MG tablet Take 1 tablet (500 mg of elemental calcium total) by mouth 2 (two) times daily with a meal. 60 tablet 1  . clonazePAM (KLONOPIN) 0.5 MG tablet  Take 0.5 mg by mouth 2 (two) times daily as needed.    . dicyclomine (BENTYL) 10 MG capsule TAKE 1 CAPSULE 3 TIMES A DAY AS NEEDED FOR SPASMS. 60 capsule 4  . diphenhydrAMINE (BENADRYL) 50 MG tablet Take 1 tablet (50 mg total) by mouth once for 1 dose. Take 1 hour before your procedure time 30 tablet 0  . DULoxetine (CYMBALTA) 60 MG capsule Take 1 capsule (60 mg total) by mouth daily. 30 capsule 1  . hydrochlorothiazide (HYDRODIURIL) 25 MG tablet Take 1 tablet (25 mg total) by mouth daily. 90 tablet 1  . levothyroxine (SYNTHROID) 100 MCG tablet Take 1 tablet (100 mcg total) by mouth daily before breakfast. 30 tablet 6  . losartan (COZAAR) 100 MG tablet Take 1 tablet (100 mg total) by mouth daily. 90 tablet 1  . ondansetron (ZOFRAN ODT) 4 MG disintegrating tablet Take 1 tablet (4 mg total) every 8 (eight) hours as needed by mouth for nausea or vomiting. 4mg  ODT q4 hours prn nausea/vomit 30 tablet 0  . pantoprazole (PROTONIX) 40 MG tablet Take 1 tablet (40 mg total) by mouth daily before breakfast. 90 tablet 3  . potassium chloride SA (K-DUR,KLOR-CON) 20 MEQ tablet Take 20 mEq by mouth daily.     . predniSONE (DELTASONE) 50 MG tablet Take one tablet 13 hours before procedure time, then 7 hours before procedure, then 1 hour before procedure 3 tablet 0  . rosuvastatin (CRESTOR) 5 MG tablet Take 5 mg by mouth daily.      No current facility-administered medications for this visit.      Past Surgical History:  Procedure Laterality Date  . ABDOMINAL HYSTERECTOMY    . APPENDECTOMY  1978  . BIOPSY  07/08/2017   Procedure: BIOPSY;  Surgeon: Rogene Houston, MD;  Location: [facility];  Service: Endoscopy;;  gastric  . CHOLECYSTECTOMY  2005  . COLONOSCOPY WITH PROPOFOL N/A 10/28/2017   Procedure: COLONOSCOPY WITH PROPOFOL;  Surgeon: Rogene Houston, MD;  Location: [facility];  Service: Endoscopy;  Laterality: N/A;  7:30  . ESOPHAGOGASTRODUODENOSCOPY N/A 07/08/2017   Procedure:  ESOPHAGOGASTRODUODENOSCOPY (EGD);  Surgeon: Rogene Houston, MD;  Location: [facility];  Service: Endoscopy;  Laterality: N/A;  220  . NASAL SEPTUM SURGERY    . ROBOTIC ASSITED PARTIAL NEPHRECTOMY Right 05/17/2017   Procedure: XI ROBOTIC ASSITED PARTIAL NEPHRECTOMY;  Surgeon: Alexis Frock, MD;  Location: [facility];  Service: Urology;  Laterality: Right;  . THYROIDECTOMY N/A 03/10/2018   Procedure: TOTAL THYROIDECTOMY;  Surgeon: Aviva Signs, MD;  Location: [facility];  Service: General;  Laterality: N/A;     Allergies  Allergen Reactions  . Iodinated Diagnostic Agents Hives and Itching  . Isovue [Iopamidol] Itching    And hives   . Nitrofurantoin Hives  . Other Hives, Itching, Rash and Other (See Comments)  Use Paper tape ONLY  . Sulfa Antibiotics Hives  . Adhesive [Tape] Rash and Other (See Comments)    Use Paper tape ONLY  . Mango Flavor Swelling and Rash    Tongue swelling, feels like throat is closing in on her       Family History  Problem Relation Age of Onset  . COPD Mother   . Diabetes Mother   . Hypertension Mother   . High Cholesterol Mother   . Liver cancer Mother   . COPD Sister   . Hypertension Brother   . High Cholesterol Brother   . Hypertension Brother   . Asthma Son      Social History Ms. Fulbright reports that she has never smoked. She has never used smokeless tobacco. Ms. Barbour reports that she drinks alcohol.   Review of Systems CONSTITUTIONAL: No weight loss, fever, chills, weakness or fatigue.  HEENT: Eyes: No visual loss, blurred vision, double vision or yellow sclerae.No hearing loss, sneezing, congestion, runny nose or sore throat.  SKIN: No rash or itching.  CARDIOVASCULAR: per hpi RESPIRATORY: No shortness of breath, cough or sputum.  GASTROINTESTINAL: No anorexia, nausea, vomiting or diarrhea. No abdominal pain or blood.  GENITOURINARY: No burning on urination, no polyuria NEUROLOGICAL: No headache, dizziness, syncope, paralysis, ataxia,  numbness or tingling in the extremities. No change in bowel or bladder control.  MUSCULOSKELETAL: No muscle, back pain, joint pain or stiffness.  LYMPHATICS: No enlarged nodes. No history of splenectomy.  PSYCHIATRIC: No history of depression or anxiety.  ENDOCRINOLOGIC: No reports of sweating, cold or heat intolerance. No polyuria or polydipsia.  Marland Kitchen   Physical Examination Vitals:   07/03/18 1018  BP: 118/82  Pulse: 77  SpO2: 95%   Vitals:   07/03/18 1018  Weight: 205 lb 3.2 oz (93.1 kg)  Height: 5\' 3"  (1.6 m)    Gen: resting comfortably, no acute distress HEENT: no scleral icterus, pupils equal round and reactive, no palptable cervical adenopathy,  CV: RRR, no mr/g, no jvd Resp: Clear to auscultation bilaterally GI: abdomen is soft, non-tender, non-distended, normal bowel sounds, no hepatosplenomegaly MSK: extremities are warm, no edema.  Skin: warm, no rash Neuro:  no focal deficits Psych: appropriate affect   Diagnostic Studies Echo 07/2014  Study Conclusions  - Left ventricle: The cavity size was normal. Wall thickness was normal. The estimated ejection fraction was 55%. Wall motion was normal; there were no regional wall motion abnormalities. Doppler parameters are consistent with abnormal left ventricular relaxation (grade 1 diastolic dysfunction). - Aortic valve: There was trivial regurgitation. - Mitral valve: Mildly thickened leaflets . Mild systolic bowing without prolapse, involving the anterior leaflet. There was mild regurgitation directed posteriorly. - Left atrium: The atrium was moderately dilated. - Right atrium: Central venous pressure (est): 3 mm Hg. - Atrial septum: The septum bowed from left to right, consistent with increased left atrial pressure. - Tricuspid valve: There was trivial regurgitation. - Pulmonary arteries: PA peak pressure: 23 mm Hg (S). - Pericardium, extracardiac: A prominent pericardial fat pad  was present.  Impressions:  - Normal LV wall thickness and chamber size with LVEF approximately 78%, grade 1 diastolic dysfunction. Mildly thickened mitral leaflets with bowing of the anterior leaflet and mild posteriorly directed mitral regurgitation. Moderate left atrial enlargement. Trivial tricuspid regurgitation with PASP 23 mmHg.  04/2018 event monitor  7 day event monitor  Min HR 43, Max HR 118, Avg Hr 63. Min HR occurred early AM hours,presumably while sleeping  Reported symptoms  correlated with sinus rhythm  No arrhythmias  Assessment and Plan  1. Syncope/Orthostatic hypotension.  -still with symptoms. Management has been complicated by ongoing diarrhea, significant edema with diuretic. We will change HCTZ to 12.5mg  every other day.   2. Palpitations - benign 7 day event monitor - ongoing symptoms at night, try lopressor 12.5mg  at night only     F/u 2 months      Arnoldo Lenis, M.D.

## 2018-07-04 ENCOUNTER — Encounter (INDEPENDENT_AMBULATORY_CARE_PROVIDER_SITE_OTHER): Payer: Self-pay | Admitting: Physical Medicine and Rehabilitation

## 2018-07-04 ENCOUNTER — Ambulatory Visit (INDEPENDENT_AMBULATORY_CARE_PROVIDER_SITE_OTHER): Payer: 59 | Admitting: Physical Medicine and Rehabilitation

## 2018-07-04 ENCOUNTER — Ambulatory Visit (INDEPENDENT_AMBULATORY_CARE_PROVIDER_SITE_OTHER): Payer: Self-pay

## 2018-07-04 VITALS — BP 131/80 | HR 65 | Temp 98.9°F

## 2018-07-04 DIAGNOSIS — M5116 Intervertebral disc disorders with radiculopathy, lumbar region: Secondary | ICD-10-CM

## 2018-07-04 DIAGNOSIS — M47816 Spondylosis without myelopathy or radiculopathy, lumbar region: Secondary | ICD-10-CM

## 2018-07-04 MED ORDER — METHYLPREDNISOLONE ACETATE 80 MG/ML IJ SUSP
80.0000 mg | Freq: Once | INTRAMUSCULAR | Status: AC
  Administered 2018-07-04: 80 mg

## 2018-07-04 NOTE — Patient Instructions (Signed)

## 2018-07-04 NOTE — Progress Notes (Signed)
 .  Numeric Pain Rating Scale and Functional Assessment Average Pain 6   In the last MONTH (on 0-10 scale) has pain interfered with the following?  1. General activity like being  able to carry out your everyday physical activities such as walking, climbing stairs, carrying groceries, or moving a chair?  Rating(5)   +Driver, -BT, +Dye Allergies(Isovue).

## 2018-07-08 ENCOUNTER — Inpatient Hospital Stay (HOSPITAL_BASED_OUTPATIENT_CLINIC_OR_DEPARTMENT_OTHER): Payer: 59 | Admitting: Hematology

## 2018-07-08 ENCOUNTER — Other Ambulatory Visit: Payer: Self-pay

## 2018-07-08 ENCOUNTER — Encounter (HOSPITAL_COMMUNITY): Payer: Self-pay | Admitting: Hematology

## 2018-07-08 VITALS — BP 141/71 | HR 55 | Temp 98.4°F | Resp 18 | Wt 205.4 lb

## 2018-07-08 DIAGNOSIS — R109 Unspecified abdominal pain: Secondary | ICD-10-CM

## 2018-07-08 DIAGNOSIS — E890 Postprocedural hypothyroidism: Secondary | ICD-10-CM

## 2018-07-08 DIAGNOSIS — G894 Chronic pain syndrome: Secondary | ICD-10-CM | POA: Diagnosis not present

## 2018-07-08 DIAGNOSIS — Z85528 Personal history of other malignant neoplasm of kidney: Secondary | ICD-10-CM

## 2018-07-08 DIAGNOSIS — C73 Malignant neoplasm of thyroid gland: Secondary | ICD-10-CM | POA: Diagnosis not present

## 2018-07-08 DIAGNOSIS — Z905 Acquired absence of kidney: Secondary | ICD-10-CM

## 2018-07-08 NOTE — Assessment & Plan Note (Signed)
1.  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  CT scan of the abdomen and pelvis will be done once a year.  -Today's physical examination did not reveal any abnormalities.  Lab work was also within normal limits.  She will come back in 4 months after CT scans and of January.  2.  Chronic pain syndrome:  -She does have pain in the right mid quadrant, which radiates to the back on and off.  She took gabapentin in the past which helped.  She discontinued it as it was causing ankle swelling.  3.  Thyroid cancer: - She underwent thyroidectomy and radio active iodine treatment.  She follows up with Dr. Dorris Fetch.

## 2018-07-08 NOTE — Patient Instructions (Signed)
[place] at [facility] Discharge Instructions  Follow up in January after your CT scans with labs.    Thank you for choosing [place] at [facility] to provide your oncology and hematology care.  To afford each patient quality time with our provider, please arrive at least 15 minutes before your scheduled appointment time.   If you have a lab appointment with the [facility] please come in thru the  Main Entrance and check in at the main information desk  You need to re-schedule your appointment should you arrive 10 or more minutes late.  We strive to give you quality time with our providers, and arriving late affects you and other patients whose appointments are after yours.  Also, if you no show three or more times for appointments you may be dismissed from the clinic at the providers discretion.     Again, thank you for choosing [facility].  Our hope is that these requests will decrease the amount of time that you wait before being seen by our physicians.       _____________________________________________________________  Should you have questions after your visit to [facility], please contact our office at [phone] between the hours of 8:00 a.m. and 4:30 p.m.  Voicemails left after 4:00 p.m. will not be returned until the following business day.  For prescription refill requests, have your pharmacy contact our office and allow 72 hours.    Cancer Center Support Programs:   > Cancer Support Group  2nd Tuesday of the month 1pm-2pm, Journey Room

## 2018-07-08 NOTE — Progress Notes (Signed)
[address], [place] [address]   CLINIC:  Medical Oncology/Hematology  PCP:  Janora Norlander, DO [place] [address] [phone]   REASON FOR VISIT: Follow-up for right kidney cancer and new diagnosis of thyroid cancer  CURRENT THERAPY: surveillance    INTERVAL HISTORY:  Ms. Eckman 62 y.o. female returns for routine follow-up for right kidney cancer and a new diagnosis of thyroid cancer. She recently had her thyroid removed. She is recovered well. She still has a little trouble swallowing solid foods. She still has diarrhea daily. It has changes from brown to green now. Her GI doctor seems to think that is medication related. Her appetite is 75% and she has no problem maintaining her weight. Her energy level is 50%. She denies any nausea or vomiting. Denies any skin rashes. Denies any new pains. Denies any bleeding or hematuria.     REVIEW OF SYSTEMS:  Review of Systems  Constitutional: Positive for fatigue.  HENT:   Positive for trouble swallowing.   Gastrointestinal: Positive for diarrhea.  Neurological: Positive for dizziness and extremity weakness.  Hematological: Bruises/bleeds easily.  All other systems reviewed and are negative.    PAST MEDICAL/SURGICAL HISTORY:  Past Medical History:  Diagnosis Date  . Anemia   . Arthritis   . Asthma    seasonal asthma rarely   . Chronic kidney disease    renal mass right kidney   . CPAP (continuous positive airway pressure) dependence   . Diverticulitis   . GERD (gastroesophageal reflux disease)   . Hemorrhoids   . History of kidney stones   . HTN (hypertension) 07/29/2014  . Hyperlipemia   . Hypertension   . Migraine   . Multinodular goiter 08/21/2017   Last Assessment & Plan:  She will need an f/u u/s of thyroid to reassess nodules. Repeat in 07/2018 Nodules are too small for biopsy now.  . 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 Papillary thyroid carcinoma ([place]) 03/10/2018  . PONV (postoperative nausea and vomiting)    patient reports being "slower to wake than average "   . Pre-diabetes    "ive been told i'm borderline"   . Sleep apnea    CPAP use    Past Surgical History:  Procedure Laterality Date  . ABDOMINAL HYSTERECTOMY    . APPENDECTOMY  1978  . BIOPSY  07/08/2017   Procedure: BIOPSY;  Surgeon: Rogene Houston, MD;  Location: [facility];  Service: Endoscopy;;  gastric  . CHOLECYSTECTOMY  2005  . COLONOSCOPY WITH PROPOFOL N/A 10/28/2017   Procedure: COLONOSCOPY WITH PROPOFOL;  Surgeon: Rogene Houston, MD;  Location: [facility];  Service: Endoscopy;  Laterality: N/A;  7:30  . ESOPHAGOGASTRODUODENOSCOPY N/A 07/08/2017   Procedure: ESOPHAGOGASTRODUODENOSCOPY (EGD);  Surgeon: Rogene Houston, MD;  Location: [facility];  Service: Endoscopy;  Laterality: N/A;  220  . NASAL SEPTUM SURGERY    . ROBOTIC ASSITED PARTIAL NEPHRECTOMY Right 05/17/2017   Procedure: XI ROBOTIC ASSITED PARTIAL NEPHRECTOMY;  Surgeon: Alexis Frock, MD;  Location: [facility];  Service: Urology;  Laterality: Right;  . THYROIDECTOMY N/A 03/10/2018   Procedure: TOTAL THYROIDECTOMY;  Surgeon: Aviva Signs, MD;  Location: [facility];  Service: General;  Laterality: N/A;     SOCIAL HISTORY:  Social History   Socioeconomic History  . Marital status: Widowed    Spouse name: Not on file  . Number of  children: Not on file  . Years of education: Not on file  . Highest education level: Not on file  Occupational History  . Not on file  Social Needs  . Financial resource strain: Not on file  . Food insecurity:    Worry: Not on file    Inability: Not on file  . Transportation needs:    Medical: Not on file    Non-medical: Not on file  Tobacco Use  . Smoking status: Never Smoker  . Smokeless tobacco: Never Used  Substance and Sexual Activity  . Alcohol use: Yes    Comment: rarely   . Drug use: No  . Sexual activity: Not  Currently  Lifestyle  . Physical activity:    Days per week: Not on file    Minutes per session: Not on file  . Stress: Not on file  Relationships  . Social connections:    Talks on phone: Not on file    Gets together: Not on file    Attends religious service: Not on file    Active member of club or organization: Not on file    Attends meetings of clubs or organizations: Not on file    Relationship status: Not on file  . Intimate partner violence:    Fear of current or ex partner: Not on file    Emotionally abused: Not on file    Physically abused: Not on file    Forced sexual activity: Not on file  Other Topics Concern  . Not on file  Social History Narrative  . Not on file    FAMILY HISTORY:  Family History  Problem Relation Age of Onset  . COPD Mother   . Diabetes Mother   . Hypertension Mother   . High Cholesterol Mother   . Liver cancer Mother   . COPD Sister   . Hypertension Brother   . High Cholesterol Brother   . Hypertension Brother   . Asthma Son     CURRENT MEDICATIONS:  Outpatient Encounter Medications as of 07/08/2018  Medication Sig  . baclofen (LIORESAL) 10 MG tablet Take 10 mg by mouth 2 (two) times daily as needed.  . calcium carbonate (OS-CAL - DOSED IN MG OF ELEMENTAL CALCIUM) 1250 (500 Ca) MG tablet Take 1 tablet (500 mg of elemental calcium total) by mouth 2 (two) times daily with a meal.  . clonazePAM (KLONOPIN) 0.5 MG tablet Take 0.5 mg by mouth 2 (two) times daily as needed.  . dicyclomine (BENTYL) 10 MG capsule TAKE 1 CAPSULE 3 TIMES A DAY AS NEEDED FOR SPASMS.  Marland Kitchen diphenhydrAMINE (BENADRYL) 50 MG tablet Take 1 tablet (50 mg total) by mouth once for 1 dose. Take 1 hour before your procedure time  . DULoxetine (CYMBALTA) 60 MG capsule TAKE 1 CAPSULE BY MOUTH ONCE DAILY.  . hydrochlorothiazide (HYDRODIURIL) 25 MG tablet Take 1 tablet (25 mg total) by mouth every other day.  . levothyroxine (SYNTHROID) 100 MCG tablet Take 1 tablet (100 mcg total)  by mouth daily before breakfast.  . losartan (COZAAR) 100 MG tablet Take 1 tablet (100 mg total) by mouth daily.  . metoprolol tartrate (LOPRESSOR) 25 MG tablet Take 0.5 tablets (12.5 mg total) by mouth at bedtime.  . ondansetron (ZOFRAN ODT) 4 MG disintegrating tablet Take 1 tablet (4 mg total) every 8 (eight) hours as needed by mouth for nausea or vomiting. 4mg  ODT q4 hours prn nausea/vomit  . pantoprazole (PROTONIX) 40 MG tablet Take 1 tablet (40 mg total)  by mouth daily before breakfast.  . potassium chloride SA (K-DUR,KLOR-CON) 20 MEQ tablet Take 20 mEq by mouth daily.   . rosuvastatin (CRESTOR) 5 MG tablet Take 5 mg by mouth daily.   . [DISCONTINUED] predniSONE (DELTASONE) 50 MG tablet Take one tablet 13 hours before procedure time, then 7 hours before procedure, then 1 hour before procedure   No facility-administered encounter medications on file as of 07/08/2018.     ALLERGIES:  Allergies  Allergen Reactions  . Iodinated Diagnostic Agents Hives and Itching  . Isovue [Iopamidol] Itching    And hives   . Nitrofurantoin Hives  . Other Hives, Itching, Rash and Other (See Comments)    Use Paper tape ONLY  . Sulfa Antibiotics Hives  . Adhesive [Tape] Rash and Other (See Comments)    Use Paper tape ONLY  . Mango Flavor Swelling and Rash    Tongue swelling, feels like throat is closing in on her      PHYSICAL EXAM:  ECOG Performance status: 1  Vitals:   07/08/18 1416  BP: (!) 141/71  Pulse: (!) 55  Resp: 18  Temp: 98.4 F (36.9 C)  SpO2: 100%   Filed Weights   07/08/18 1416  Weight: 205 lb 6.4 oz (93.2 kg)    Physical Exam  Constitutional: She is oriented to person, place, and time. She appears well-developed and well-nourished.  Cardiovascular: Normal rate, regular rhythm and normal heart sounds.  Pulmonary/Chest: Effort normal and breath sounds normal.  Musculoskeletal: Normal range of motion.  Neurological: She is alert and oriented to person, place, and time.    Skin: Skin is warm and dry.  Psychiatric: She has a normal mood and affect. Her behavior is normal. Judgment and thought content normal.  Abdomen: Soft nontender with no palpable masses. Extremities: No edema or cyanosis.   LABORATORY DATA:  I have reviewed the labs as listed.  CBC    Component Value Date/Time   WBC 5.1 06/24/2018 1026   RBC 3.92 06/24/2018 1026   HGB 11.0 (L) 06/24/2018 1026   HCT 36.3 06/24/2018 1026   PLT 140 (L) 06/24/2018 1026   MCV 92.6 06/24/2018 1026   MCH 28.1 06/24/2018 1026   MCHC 30.3 06/24/2018 1026   RDW 16.0 (H) 06/24/2018 1026   LYMPHSABS 1.4 06/24/2018 1026   MONOABS 0.5 06/24/2018 1026   EOSABS 0.2 06/24/2018 1026   BASOSABS 0.0 06/24/2018 1026   CMP Latest Ref Rng & Units 06/24/2018 06/06/2018 05/07/2018  Glucose 70 - 99 mg/dL 94 134(H) 152(H)  BUN 8 - 23 mg/dL 8 11 11   Creatinine 0.44 - 1.00 mg/dL 0.74 0.79 0.73  Sodium 135 - 145 mmol/L 141 139 142  Potassium 3.5 - 5.1 mmol/L 3.7 3.7 3.5  Chloride 98 - 111 mmol/L 107 102 106  CO2 22 - 32 mmol/L 26 23 22   Calcium 8.9 - 10.3 mg/dL 9.1 9.3 9.4  Total Protein 6.5 - 8.1 g/dL 6.9 - -  Total Bilirubin 0.3 - 1.2 mg/dL 0.8 - -  Alkaline Phos 38 - 126 U/L 72 - -  AST 15 - 41 U/L 17 - -  ALT 0 - 44 U/L 21 - -       DIAGNOSTIC IMAGING:  I have reviewed CT scan from January [facility].     ASSESSMENT & PLAN:   History of renal cell carcinoma 1.  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  CT  scan of the abdomen and pelvis will be done once a year.  -Today's physical examination did not reveal any abnormalities.  Lab work was also within normal limits.  She will come back in 4 months after CT scans and of January.  2.  Chronic pain syndrome:  -She does have pain in the right mid quadrant, which radiates to the back on and off.  She took gabapentin in the past which helped.  She discontinued it as it was causing ankle swelling.  3.  Thyroid cancer: - She underwent thyroidectomy and radio active iodine treatment.  She follows up with Dr. Dorris Fetch.      Orders placed this encounter:  Orders Placed This Encounter  Procedures  . CT Chest W Contrast  . CT Abdomen Pelvis W Contrast  . CBC with Differential/Platelet  . Comprehensive metabolic panel  . Lactate dehydrogenase      Derek Jack, MD [place] [phone]

## 2018-07-11 NOTE — Progress Notes (Signed)
Rebecca Scott - 62 y.o. female MRN [id number]  Date of birth: 09-14-1955  Office Visit Note: Visit Date: 07/04/2018 PCP: Janora Norlander, DO Referred by: Janora Norlander, DO  Subjective: Chief Complaint  Patient presents with  . Lower Back - Pain   HPI:  Rebecca Scott is a 62 y.o. female who comes in today At the request of Dr. Eduard Roux for possible interventional spine procedure.  Quick evaluation shows patient has somewhat complicated medical history currently with history of renal cell carcinoma now thyroid cancer.  She has been having right-sided low back pain with inconsistent referral pattern but it is in the buttock and lateral area of the low back.  She has failed physical therapy and dry needling and medication management with anti-inflammatories and muscle relaxer.  Dr. Erlinda Hong did obtain MRI of the lumbar spine which is reviewed below.  The biggest finding is at L4-5 with facet arthropathy and disc protrusion although small does narrow the right lateral recess at L4-5.  She gets worsening symptoms with standing and walking.  Sitting and leaning forward does help with the pain.  Clinically I feel more of her pain is consistent with the lateral recess stenosis more than the facet arthropathy but I think she has both.  There is no leg pain however.  If she does not do well with 5-S1 interlaminar epidural injection would look at diagnostic medial branch blocks.  My goal would be radiofrequency ablation if that was successful.  She is failed conservative care otherwise.  Exam today does show pain with extension rotation of the lumbar spine with facet loading.  ROS Otherwise per HPI.  Assessment & Plan: Visit Diagnoses:  1. Radiculopathy due to lumbar intervertebral disc disorder   2. Spondylosis without myelopathy or radiculopathy, lumbar region     Plan: No additional findings.   Meds & Orders:  Meds ordered this encounter  Medications  . methylPREDNISolone acetate  (DEPO-MEDROL) injection 80 mg    Orders Placed This Encounter  Procedures  . XR C-ARM NO REPORT  . Epidural Steroid injection    Follow-up: Return if symptoms worsen or fail to improve.   Procedures: No procedures performed  Lumbar Epidural Steroid Injection - Interlaminar Approach with Fluoroscopic Guidance  Patient: Rebecca Scott      Date of Birth: 08-Jul-1956 MRN: [id number] PCP: Janora Norlander, DO      Visit Date: 07/04/2018   Universal Protocol:     Consent Given By: the patient  Position: PRONE  Additional Comments: Vital signs were monitored before and after the procedure. Patient was prepped and draped in the usual sterile fashion. The correct patient, procedure, and site was verified.   Injection Procedure Details:  Procedure Site One Meds Administered:  Meds ordered this encounter  Medications  . methylPREDNISolone acetate (DEPO-MEDROL) injection 80 mg     Laterality: Right  Location/Site:  L5-S1  Needle size: 20 G  Needle type: Tuohy  Needle Placement: Paramedian epidural  Findings:   -Comments: Excellent flow of contrast into the epidural space.  Procedure Details: Using a paramedian approach from the side mentioned above, the region overlying the inferior lamina was localized under fluoroscopic visualization and the soft tissues overlying this structure were infiltrated with 4 ml. of 1% Lidocaine without Epinephrine. The Tuohy needle was inserted into the epidural space using a paramedian approach.   The epidural space was localized using loss of resistance along with lateral and bi-planar fluoroscopic views.  After negative  aspirate for air, blood, and CSF, a 2 ml. volume of Isovue-250 was injected into the epidural space and the flow of contrast was observed. Radiographs were obtained for documentation purposes.    The injectate was administered into the level noted above.   Additional Comments:  The patient tolerated the procedure  well Dressing: Band-Aid    Post-procedure details: Patient was observed during the procedure. Post-procedure instructions were reviewed.  Patient left the clinic in stable condition.   Clinical History: MRI LUMBAR SPINE WITHOUT CONTRAST  TECHNIQUE: Multiplanar, multisequence MR imaging of the lumbar spine was performed. No intravenous contrast was administered.  COMPARISON:  Lumbar radiographs 05/13/2018. CT Abdomen and Pelvis 02/17/2018.  FINDINGS: Segmentation:  Normal on the comparisons.  Alignment: Stable with mild straightening of lumbar lordosis. Slight levoconvex upper lumbar spine curvature.  Vertebrae: Benign vertebral body hemangiomas at the L3, L4, and S1 levels. Visualized bone marrow signal is within normal limits. No marrow edema or evidence of acute osseous abnormality. Intact visible sacrum and SI joints.  Conus medullaris and cauda equina: Conus extends to the T12-L1 level. No lower spinal cord or conus signal abnormality.  Paraspinal and other soft tissues: Stable visible abdominal viscera since the June CT. Negative visualized posterior paraspinal soft tissues.  Disc levels:  T11-T12: Mild disc bulge and right facet hypertrophy.  No stenosis.  T12-L1:  Negative.  L1-L2:  Anterior eccentric circumferential disc bulge.  No stenosis.  L2-L3:  Anterior eccentric circumferential disc bulge.  No stenosis.  L3-L4: Mild foraminal disc bulging and endplate spurring. Mild facet and ligament flavum hypertrophy. No spinal or lateral recess stenosis. Mild left and moderate right L3 neural foraminal stenosis.  L4-L5: Disc desiccation and disc space loss. Circumferential disc bulge. Superimposed broad-based small disc protrusion into the right lateral recess (series 6, image 39). Mild facet and ligament flavum hypertrophy. Moderate right lateral recess stenosis (descending right L5 nerve level) without significant spinal stenosis. There  is mild left lateral recess stenosis. No convincing foraminal stenosis.  L5-S1: Broad-based but small central disc extrusion on series 3, image 7 and series 6, image 45. Mild facet hypertrophy. Disc material in proximity to the descending S1 nerve roots in the lateral recesses without convincing stenosis. No convincing foraminal stenosis.  IMPRESSION: 1. No acute or suspicious osseous abnormality identified. 2. Lumbar disc and facet degeneration. Small disc herniations at L4-L5 and L5-S1 affecting the lateral recesses, most at the right L5 nerve level. Up to moderate multifactorial right L3 neural foraminal stenosis.   Electronically Signed   By: Genevie Ann M.D.   On: 06/03/2018 13:27     Objective:  VS:  HT:    WT:   BMI:     BP:131/80  HR:65bpm  TEMP:98.9 F (37.2 C)(Oral)  RESP:  Physical Exam  Ortho Exam Imaging: No results found.

## 2018-07-11 NOTE — Procedures (Signed)
Lumbar Epidural Steroid Injection - Interlaminar Approach with Fluoroscopic Guidance  Patient: Rebecca Scott      Date of Birth: December 28, 1955 MRN: [id number] PCP: Janora Norlander, DO      Visit Date: 07/04/2018   Universal Protocol:     Consent Given By: the patient  Position: PRONE  Additional Comments: Vital signs were monitored before and after the procedure. Patient was prepped and draped in the usual sterile fashion. The correct patient, procedure, and site was verified.   Injection Procedure Details:  Procedure Site One Meds Administered:  Meds ordered this encounter  Medications  . methylPREDNISolone acetate (DEPO-MEDROL) injection 80 mg     Laterality: Right  Location/Site:  L5-S1  Needle size: 20 G  Needle type: Tuohy  Needle Placement: Paramedian epidural  Findings:   -Comments: Excellent flow of contrast into the epidural space.  Procedure Details: Using a paramedian approach from the side mentioned above, the region overlying the inferior lamina was localized under fluoroscopic visualization and the soft tissues overlying this structure were infiltrated with 4 ml. of 1% Lidocaine without Epinephrine. The Tuohy needle was inserted into the epidural space using a paramedian approach.   The epidural space was localized using loss of resistance along with lateral and bi-planar fluoroscopic views.  After negative aspirate for air, blood, and CSF, a 2 ml. volume of Isovue-250 was injected into the epidural space and the flow of contrast was observed. Radiographs were obtained for documentation purposes.    The injectate was administered into the level noted above.   Additional Comments:  The patient tolerated the procedure well Dressing: Band-Aid    Post-procedure details: Patient was observed during the procedure. Post-procedure instructions were reviewed.  Patient left the clinic in stable condition.

## 2018-07-22 ENCOUNTER — Ambulatory Visit (INDEPENDENT_AMBULATORY_CARE_PROVIDER_SITE_OTHER): Payer: 59 | Admitting: Internal Medicine

## 2018-07-22 ENCOUNTER — Other Ambulatory Visit: Payer: Self-pay | Admitting: "Endocrinology

## 2018-07-22 ENCOUNTER — Encounter (INDEPENDENT_AMBULATORY_CARE_PROVIDER_SITE_OTHER): Payer: Self-pay | Admitting: Internal Medicine

## 2018-07-22 VITALS — BP 140/100 | HR 64 | Temp 98.4°F | Resp 18 | Ht 63.0 in | Wt 204.8 lb

## 2018-07-22 DIAGNOSIS — K580 Irritable bowel syndrome with diarrhea: Secondary | ICD-10-CM

## 2018-07-22 DIAGNOSIS — E890 Postprocedural hypothyroidism: Secondary | ICD-10-CM

## 2018-07-22 DIAGNOSIS — R109 Unspecified abdominal pain: Secondary | ICD-10-CM | POA: Diagnosis not present

## 2018-07-22 DIAGNOSIS — K219 Gastro-esophageal reflux disease without esophagitis: Secondary | ICD-10-CM

## 2018-07-22 MED ORDER — DICYCLOMINE HCL 10 MG PO CAPS: 20.0000 mg | ORAL_CAPSULE | Freq: Two times a day (BID) | ORAL | 4 refills | Status: DC | PRN

## 2018-07-22 MED ORDER — CHOLESTYRAMINE 4 G PO PACK: 4.0000 g | PACK | Freq: Two times a day (BID) | ORAL | 2 refills | Status: DC

## 2018-07-22 NOTE — Patient Instructions (Signed)
Please call office with progress report in 1 month. 

## 2018-07-22 NOTE — Progress Notes (Signed)
Presenting complaint;  Follow-up for abdominal pain GERD and diarrhea.  Database and subjective:  Patient is 62 year old Caucasian female who is here for scheduled visit.  She was last seen on 04/01/2018.  On her last visit she was begun on dicyclomine.  I also decreased her pantoprazole to once daily. She had thyroidectomy on 03/10/2018 for papillary carcinoma involving both lobes.  She has also received ablative radioiodine therapy.  She is being followed by Dr. Dorris Fetch.  Patient states she is feeling better.  She is having less right upper quadrant pain.  She has more noticeable pain at night but not much during the daytime.  She is still having intermittent epigastric and hypogastric pain.  She feels dicyclomine is helping with mid upper and lower abdominal pain but not the site pain.  She feels heartburn is well controlled with her diet and single PPI dose.  She remains with diarrhea.  She is having 5-6 stools per day.  Most of her stools are greenish.  She denies melena or rectal bleeding.  Her weight is only down by 2 pounds since her last visit. She states she has been diagnosed and treated for 2 different cancers.  She is very worried about her health.  She states she is really missing not having close family members nearby particularly her mother.  She says her appetite is not not good but she eats anyway. She is walking in order track 2-3 times a week.   Current Medications: Outpatient Encounter Medications as of 07/22/2018  Medication Sig  . baclofen (LIORESAL) 10 MG tablet Take 10 mg by mouth daily.   . clonazePAM (KLONOPIN) 0.5 MG tablet Take 0.5 mg by mouth 2 (two) times daily as needed.  . dicyclomine (BENTYL) 10 MG capsule TAKE 1 CAPSULE 3 TIMES A DAY AS NEEDED FOR SPASMS.  Marland Kitchen diphenhydrAMINE (BENADRYL) 50 MG tablet Take 1 tablet (50 mg total) by mouth once for 1 dose. Take 1 hour before your procedure time  . DULoxetine (CYMBALTA) 60 MG capsule TAKE 1 CAPSULE BY MOUTH ONCE DAILY.  .  hydrochlorothiazide (HYDRODIURIL) 25 MG tablet Take 1 tablet (25 mg total) by mouth every other day.  . levothyroxine (SYNTHROID) 100 MCG tablet Take 1 tablet (100 mcg total) by mouth daily before breakfast.  . losartan (COZAAR) 100 MG tablet Take 1 tablet (100 mg total) by mouth daily.  . metoprolol tartrate (LOPRESSOR) 25 MG tablet Take 0.5 tablets (12.5 mg total) by mouth at bedtime.  . ondansetron (ZOFRAN ODT) 4 MG disintegrating tablet Take 1 tablet (4 mg total) every 8 (eight) hours as needed by mouth for nausea or vomiting. 4mg  ODT q4 hours prn nausea/vomit  . pantoprazole (PROTONIX) 40 MG tablet Take 1 tablet (40 mg total) by mouth daily before breakfast.  . potassium chloride SA (K-DUR,KLOR-CON) 20 MEQ tablet Take 20 mEq by mouth daily.   . rosuvastatin (CRESTOR) 5 MG tablet Take 5 mg by mouth daily.   . [DISCONTINUED] calcium carbonate (OS-CAL - DOSED IN MG OF ELEMENTAL CALCIUM) 1250 (500 Ca) MG tablet Take 1 tablet (500 mg of elemental calcium total) by mouth 2 (two) times daily with a meal. (Patient not taking: Reported on 07/22/2018)   No facility-administered encounter medications on file as of 07/22/2018.      Objective: Blood pressure (!) 140/100, pulse 64, temperature 98.4 F (36.9 C), temperature source Oral, resp. rate 18, height 5\' 3"  (1.6 m), weight 204 lb 12.8 oz (92.9 kg). Patient is alert and in no acute  distress. Conjunctiva is pink. Sclera is nonicteric Oropharyngeal mucosa is normal. She has a small midline scar over anterior part of neck. No neck masses or thyromegaly noted. Cardiac exam with regular rhythm normal S1 and S2. No murmur or gallop noted. Lungs are clear to auscultation. Abdomen is full.  Bowel sounds are hyperactive.  On palpation abdomen is soft.  She has mild tenderness in mid epigastric region and hypogastric area.  No organomegaly or masses. No LE edema or clubbing noted.  Labs/studies Results: Lab data from 06/24/2018 WBC 5.1, H&H 11 and  36.3.  Platelet count 140 K.   Serum calcium 9.1. Bilirubin 0.8, AP 72, AST 17, ALT 21, total protein 6.9 and albumin 3.8.  Assessment:  #1.  IBS with diarrhea.  Dicyclomine does help with pain but not with stool frequency.  She is passing bilious stools and therefore will benefit from cholestyramine.  She had colonoscopy in February this year revealing mild sigmoid diverticulosis but no evidence of endoscopic colitis.  I feel stress is playing significant role in her ongoing symptoms.  #2.  GERD.  She is doing well with dietary measures and single PPI dose.  #3.  Abdominal pain.  She has at least 2 different types of pain.  She has right upper quadrant pain which started over a year ago and she has undergone extensive work-up.  Pain felt to be musculoskeletal or neuropathic.  Her midline pain is most likely due to dyspepsia and IBS.  She may benefit from higher dicyclomine dose.   Plan:  Patient reassured.  I encouraged her to spend more time with her son and daughter-in-law and grandchild.  They live in [place]. Increase dicyclomine dose to 20 mg p.o. twice daily or she will take 20 mg in the morning 10 for lunch and evening meal. Cholestyramine 4 g p.o. twice daily.  Patient advised to take cholestyramine 2 hours before or after taking other medications.  She can initially try once a day and if it does not work she can take second dose. Progress report in 1 month. Office visit in 6 months.

## 2018-07-23 LAB — TSH: TSH: 0.4 m[IU]/L (ref 0.40–4.50)

## 2018-07-23 LAB — THYROGLOBULIN LEVEL: Thyroglobulin: 0.3 ng/mL — ABNORMAL LOW

## 2018-07-23 LAB — THYROGLOBULIN ANTIBODY: Thyroglobulin Ab: <1 IU/mL (ref <=1)

## 2018-07-23 LAB — T4, FREE: Free T4: 1.2 ng/dL (ref 0.8–1.8)

## 2018-07-29 ENCOUNTER — Ambulatory Visit (INDEPENDENT_AMBULATORY_CARE_PROVIDER_SITE_OTHER): Payer: 59 | Admitting: "Endocrinology

## 2018-07-29 ENCOUNTER — Encounter: Payer: Self-pay | Admitting: "Endocrinology

## 2018-07-29 VITALS — BP 130/83 | HR 71 | Ht 63.0 in | Wt 202.0 lb

## 2018-07-29 DIAGNOSIS — E890 Postprocedural hypothyroidism: Secondary | ICD-10-CM

## 2018-07-29 DIAGNOSIS — C73 Malignant neoplasm of thyroid gland: Secondary | ICD-10-CM | POA: Diagnosis not present

## 2018-07-29 MED ORDER — LEVOTHYROXINE SODIUM 100 MCG PO TABS: 100.0000 ug | ORAL_TABLET | Freq: Every day | ORAL | 6 refills | Status: DC

## 2018-07-29 NOTE — Progress Notes (Signed)
Endocrinology follow-up note                                            07/29/2018, 2:48 PM   Subjective:    Patient ID: Rebecca Scott, female    DOB: [birth year], PCP Rebecca Norlander, DO   Past Medical History:  Diagnosis Date  . Anemia   . Arthritis   . Asthma    seasonal asthma rarely   . Chronic kidney disease    renal mass right kidney   . CPAP (continuous positive airway pressure) dependence   . Diverticulitis   . GERD (gastroesophageal reflux disease)   . Hemorrhoids   . History of kidney stones   . HTN (hypertension) 07/29/2014  . Hyperlipemia   . Hypertension   . Migraine   . Multinodular goiter 08/21/2017   Last Assessment & Plan:  She will need an f/u u/s of thyroid to reassess nodules. Repeat in 07/2018 Nodules are too small for biopsy now.  . Papillary renal cell carcinoma ([place]) 05/2017   s/p partial nephrectomy, right  . Papillary thyroid carcinoma ([place]) 03/10/2018  . PONV (postoperative nausea and vomiting)    patient reports being "slower to wake than average "   . Pre-diabetes    "ive been told i'm borderline"   . Sleep apnea    CPAP use    Past Surgical History:  Procedure Laterality Date  . ABDOMINAL HYSTERECTOMY    . APPENDECTOMY  1978  . BIOPSY  07/08/2017   Procedure: BIOPSY;  Surgeon: Rogene Houston, MD;  Location: [facility];  Service: Endoscopy;;  gastric  . CHOLECYSTECTOMY  2005  . COLONOSCOPY WITH PROPOFOL N/A 10/28/2017   Procedure: COLONOSCOPY WITH PROPOFOL;  Surgeon: Rogene Houston, MD;  [facility];  Service: Endoscopy;  Laterality: N/A;  7:30  . ESOPHAGOGASTRODUODENOSCOPY N/A 07/08/2017   Procedure: ESOPHAGOGASTRODUODENOSCOPY (EGD);  Surgeon: Rogene Houston, MD;  Location[facility];  Service: Endoscopy;  Laterality: N/A;  220  . HIP ARTHROPLASTY    . NASAL SEPTUM SURGERY    . ROBOTIC ASSITED PARTIAL NEPHRECTOMY Right 05/17/2017   Procedure: XI ROBOTIC ASSITED PARTIAL NEPHRECTOMY;  Surgeon: Alexis Frock, MD;  Location: [facility];  Service: Urology;  Laterality: Right;  . THYROIDECTOMY N/A 03/10/2018   Procedure: TOTAL THYROIDECTOMY;  Surgeon: Aviva Signs, MD;  Location: [facility];  Service: General;  Laterality: N/A;   Social History   Socioeconomic History  . Marital status: Widowed    Spouse name: Not on file  . Number of children: Not on file  . Years of education: Not on file  . Highest education level: Not on file  Occupational History  . Not on file  Social Needs  . Financial resource strain: Not on file  . Food insecurity:    Worry: Not on file    Inability: Not on file  . Transportation needs:    Medical: Not on file    Non-medical: Not on file  Tobacco Use  . Smoking status: Never Smoker  . Smokeless tobacco: Never Used  Substance and Sexual Activity  . Alcohol use: Yes    Comment: rarely   . Drug use: No  . Sexual activity: Not Currently  Lifestyle  . Physical activity:    Days per week: Not on file  Minutes per session: Not on file  . Stress: Not on file  Relationships  . Social connections:    Talks on phone: Not on file    Gets together: Not on file    Attends religious service: Not on file    Active member of club or organization: Not on file    Attends meetings of clubs or organizations: Not on file    Relationship status: Not on file  Other Topics Concern  . Not on file  Social History Narrative  . Not on file   Outpatient Encounter Medications as of 07/29/2018  Medication Sig  . baclofen (LIORESAL) 10 MG tablet Take 10 mg by mouth daily.   . cholestyramine (QUESTRAN) 4 g packet Take 1 packet (4 g total) by mouth 2 (two) times daily. Take this medication 2 hours before or after taking other medications.  . clonazePAM (KLONOPIN) 0.5 MG tablet Take 0.5 mg by mouth 2 (two) times daily as needed.  . dicyclomine (BENTYL) 10 MG capsule Take 2 capsules (20 mg total) by mouth 2 (two) times daily as needed for spasms.  . diphenhydrAMINE (BENADRYL) 50  MG tablet Take 1 tablet (50 mg total) by mouth once for 1 dose. Take 1 hour before your procedure time  . DULoxetine (CYMBALTA) 60 MG capsule TAKE 1 CAPSULE BY MOUTH ONCE DAILY.  . hydrochlorothiazide (HYDRODIURIL) 25 MG tablet Take 1 tablet (25 mg total) by mouth every other day.  . levothyroxine (SYNTHROID) 100 MCG tablet Take 1 tablet (100 mcg total) by mouth daily before breakfast.  . losartan (COZAAR) 100 MG tablet Take 1 tablet (100 mg total) by mouth daily.  . metoprolol tartrate (LOPRESSOR) 25 MG tablet Take 0.5 tablets (12.5 mg total) by mouth at bedtime.  . ondansetron (ZOFRAN ODT) 4 MG disintegrating tablet Take 1 tablet (4 mg total) every 8 (eight) hours as needed by mouth for nausea or vomiting. 84m ODT q4 hours prn nausea/vomit  . pantoprazole (PROTONIX) 40 MG tablet Take 1 tablet (40 mg total) by mouth daily before breakfast.  . potassium chloride SA (K-DUR,KLOR-CON) 20 MEQ tablet Take 20 mEq by mouth daily.   . rosuvastatin (CRESTOR) 5 MG tablet Take 5 mg by mouth daily.   . [DISCONTINUED] levothyroxine (SYNTHROID) 100 MCG tablet Take 1 tablet (100 mcg total) by mouth daily before breakfast.   No facility-administered encounter medications on file as of 07/29/2018.    ALLERGIES: Allergies  Allergen Reactions  . Iodinated Diagnostic Agents Hives and Itching  . Isovue [Iopamidol] Itching    And hives   . Nitrofurantoin Hives  . Other Hives, Itching, Rash and Other (See Comments)    Use Paper tape ONLY  . Sulfa Antibiotics Hives  . Adhesive [Tape] Rash and Other (See Comments)    Use Paper tape ONLY  . Mango Flavor Swelling and Rash    Tongue swelling, feels like throat is closing in on her     VACCINATION STATUS: Immunization History  Administered Date(s) Administered  . Influenza,inj,Quad PF,6+ Mos 06/06/2018  . Influenza-Unspecified 06/24/2017    HPI Rebecca AHLERSis 62y.o. female who is returning for follow-up after recent near total thyroidectomy for  thyroid malignancy, and postsurgical hypothyroidism.    -She underwent near total thyroidectomy on March 10, 2018 which revealed bilateral follicular variant papillary thyroid carcinoma Pathologic Stage Classification (pTNM, AJCC 8th Edition): pT2(multifocal), pNX.  She is currently on Synthroid 100 mcg p.o. every morning, reports compliance. -She is recovering from her surgery very  well.  Denies dysphagia, odynophagia, voice change, nor shortness of breath.  -She was found to have multinodular goiter in November 2018.  At that time she was found to have 2.4 cm nodule in the 5 cm right lobe of thyroid and 1.5 cm nodule in the 4.3 cm left lobe.  Repeat ultrasound in April 2019 showed thyroid remaining more or less the same size however the nodules were observed to grow to 2.7 cm on the right lobe and 1.7 cm in the left lobe.  Subsequently, earlier this month she underwent fine-needle aspiration of the right lobe nodule which showed follicular lesion of undetermined significance.  Molecular studies were not sent.   -Prior to her surgery, patient complained of  foreign body sensation and occasional choking in her neck while eating and breathing problem when she was laying on her back.   -She denies any exposure to neck radiation.  She denies family history of thyroid cancer, however multiple family members with what appears to be hypothyroidism.  She denies heat/cold intolerance.    -Her previsit thyroid function tests are consistent with appropriate replacement with thyroid hormone.    Review of Systems  Constitutional: + steady weight since last visit,  no fatigue, no subjective hyperthermia, no subjective hypothermia Eyes: no blurry vision, no xerophthalmia ENT: no sore throat, + recent thyroidectomy,  - dysphagia , - odynophagia, no hoarseness Cardiovascular: no Chest Pain, no Shortness of Breath, no palpitations, no leg swelling Respiratory: no cough, - SOB Gastrointestinal: no Nausea/Vomiting,  + occasional loose stool/Diarhhea Musculoskeletal: no muscle/joint aches Skin: no rashes Neurological: no tremors, no numbness, no tingling, no dizziness Psychiatric: no depression, no anxiety  Objective:    BP 130/83   Pulse 71   Ht '5\' 3"'  (1.6 m)   Wt 202 lb (91.6 kg)   BMI 35.78 kg/m   Wt Readings from Last 3 Encounters:  07/29/18 202 lb (91.6 kg)  07/22/18 204 lb 12.8 oz (92.9 kg)  07/08/18 205 lb 6.4 oz (93.2 kg)    Physical Exam  Constitutional: + Obese, not in acute distress, normal state of mind Eyes: PERRLA, EOMI, no exophthalmos ENT: moist mucous membranes, healing post thyroidectomy scar on anterior lower neck, no palpable nodular or mass lesions in the neck.  no gross cervical lymphadenopathy Cardiovascular: normal precordial activity, Regular Rate and Rhythm, no Murmur/Rubs/Gallops Respiratory:  adequate breathing efforts, no gross chest deformity, Clear to auscultation bilaterally Gastrointestinal: abdomen soft, Non -tender, No distension, Bowel Sounds present Musculoskeletal: no gross deformities, strength intact in all four extremities Skin: moist, warm, no rashes Neurological: no tremor with outstretched hands, Deep tendon reflexes normal in all four extremities.  Diabetic Labs (most recent): Lab Results  Component Value Date   HGBA1C 5.4 03/06/2018   HGBA1C 5.7 (H) 05/09/2017     Recent Results (from the past 2160 hour(s))  Basic Metabolic Panel     Status: Abnormal   Collection Time: 05/07/18 10:35 AM  Result Value Ref Range   Glucose 152 (H) 65 - 99 mg/dL   BUN 11 8 - 27 mg/dL   Creatinine, Ser 0.73 0.57 - 1.00 mg/dL   GFR calc non Af Amer 89 >59 mL/min/1.73   GFR calc Af Amer 103 >59 mL/min/1.73   BUN/Creatinine Ratio 15 12 - 28   Sodium 142 134 - 144 mmol/L   Potassium 3.5 3.5 - 5.2 mmol/L   Chloride 106 96 - 106 mmol/L   CO2 22 20 - 29 mmol/L   Calcium 9.4 8.7 -  10.3 mg/dL  TSH     Status: None   Collection Time: 05/16/18  9:51 AM  Result  Value Ref Range   TSH 1.28 0.40 - 4.50 mIU/L  T4, free     Status: None   Collection Time: 05/16/18  9:51 AM  Result Value Ref Range   Free T4 1.1 0.8 - 1.8 ng/dL  Thyroid peroxidase antibody     Status: None   Collection Time: 05/16/18  9:51 AM  Result Value Ref Range   Thyroperoxidase Ab SerPl-aCnc 2 <9 IU/mL  Thyroglobulin antibody     Status: None   Collection Time: 05/16/18  9:51 AM  Result Value Ref Range   Thyroglobulin Ab <1 < or = 1 IU/mL  Thyroglobulin Level     Status: Abnormal   Collection Time: 05/16/18  9:51 AM  Result Value Ref Range   Thyroglobulin 0.4 (L) ng/mL    Comment:       Reference Range:       Intact Thyroid   2.8-40.9       Athyrotic        <0.1 .       Note: Abnormal flagging is based       on the reference interval for        patients with intact thyroid. . . This test was performed using the Beckman Coulter  chemiluminescent method. Values obtained from different assay methods cannot be used interchangeably. Thyroglobulin levels, regardless of value, should not be interpreted as absolute evidence of the presence or absence of disease. .    Comment      Comment: . Thyroglobulin antibodies (TGAB) interfere with thyroglobulin (TG) assays; therefore, TGAB assay should always be performed in conjunction with a TG assay. .   Basic Metabolic Panel     Status: Abnormal   Collection Time: 06/06/18  8:42 AM  Result Value Ref Range   Glucose 134 (H) 65 - 99 mg/dL   BUN 11 8 - 27 mg/dL   Creatinine, Ser 0.79 0.57 - 1.00 mg/dL   GFR calc non Af Amer 81 >59 mL/min/1.73   GFR calc Af Amer 93 >59 mL/min/1.73   BUN/Creatinine Ratio 14 12 - 28   Sodium 139 134 - 144 mmol/L   Potassium 3.7 3.5 - 5.2 mmol/L   Chloride 102 96 - 106 mmol/L   CO2 23 20 - 29 mmol/L   Calcium 9.3 8.7 - 10.3 mg/dL  Urinalysis, Complete     Status: None   Collection Time: 06/06/18  8:51 AM  Result Value Ref Range   Specific Gravity, UA 1.015 1.005 - 1.030   pH, UA 7.0  5.0 - 7.5   Color, UA Yellow Yellow   Appearance Ur Clear Clear   Leukocytes, UA Negative Negative   Protein, UA Negative Negative/Trace   Glucose, UA Negative Negative   Ketones, UA Negative Negative   RBC, UA Negative Negative   Bilirubin, UA Negative Negative   Urobilinogen, Ur 0.2 0.2 - 1.0 mg/dL   Nitrite, UA Negative Negative   Microscopic Examination See below:   Microscopic Examination     Status: None   Collection Time: 06/06/18  8:51 AM  Result Value Ref Range   WBC, UA None seen 0 - 5 /hpf   RBC, UA None seen 0 - 2 /hpf   Epithelial Cells (non renal) 0-10 0 - 10 /hpf   Bacteria, UA None seen None seen/Few  Thyroglobulin antibody     Status: None  Collection Time: 06/13/18  8:22 AM  Result Value Ref Range   Thyroglobulin Antibody <1.0 0.0 - 0.9 IU/mL    Comment: (NOTE) Thyroglobulin Antibody measured by [facility] Methodology Performed At: [facility] [place], [place] [id number] Rush Farmer MD JI:[id number]   Thyroglobulin     Status: None   Collection Time: 06/13/18  8:22 AM  Result Value Ref Range   Thyroglobulin <2.0 ng/mL    Comment: (NOTE) Reference Range: Pubertal Children and Adults: <40 According to the [facility] of Clinical Biochemistry, the reference interval for Thyroglobulin (TG) should be related to euthyroid patients and not for patients who underwent thyroidectomy.  TG reference intervals for these patients depend on the residual mass of the thyroid tissue left after surgery.  Establishing a post-operative baseline is recommended.  The assay quantitation limit is 2.0 ng/mL. Performed At: [place], [place] [id number] [place] Sheral Apley MD BP:[id number]   T4, free     Status: None   Collection Time: 06/13/18  8:23 AM  Result Value Ref Range   Free T4 0.98 0.82 - 1.77 ng/dL    Comment: (NOTE) Biotin ingestion may interfere with free T4 tests. If the results  are inconsistent with the TSH level, previous test results, or the clinical presentation, then consider biotin interference. If needed, order repeat testing after stopping biotin. Performed at [facility], [place] [address] [place], [place] [address]   TSH     Status: Abnormal   Collection Time: 06/13/18  8:23 AM  Result Value Ref Range   TSH 122.131 (H) 0.350 - 4.500 uIU/mL    Comment: Performed by a 3rd Generation assay with a functional sensitivity of <=0.01 uIU/mL. Performed at [facility], [address] [place], [place] [address]   Comprehensive metabolic panel     Status: None   Collection Time: 06/24/18 10:26 AM  Result Value Ref Range   Sodium 141 135 - 145 mmol/L   Potassium 3.7 3.5 - 5.1 mmol/L   Chloride 107 98 - 111 mmol/L   CO2 26 22 - 32 mmol/L   Glucose, Bld 94 70 - 99 mg/dL   BUN 8 8 - 23 mg/dL   Creatinine, Ser 0.74 0.44 - 1.00 mg/dL   Calcium 9.1 8.9 - 10.3 mg/dL   Total Protein 6.9 6.5 - 8.1 g/dL   Albumin 3.8 3.5 - 5.0 g/dL   AST 17 15 - 41 U/L   ALT 21 0 - 44 U/L   Alkaline Phosphatase 72 38 - 126 U/L   Total Bilirubin 0.8 0.3 - 1.2 mg/dL   GFR calc non Af Amer >60 >60 mL/min   GFR calc Af Amer >60 >60 mL/min    Comment: (NOTE) The eGFR has been calculated using the CKD EPI equation. This calculation has not been validated in all clinical situations. eGFR's persistently <60 mL/min signify possible Chronic Kidney Disease.    Anion gap 8 5 - 15    Comment: Performed at [facility], [address] [place], [place] [address]  CBC with Differential     Status: Abnormal   Collection Time: 06/24/18 10:26 AM  Result Value Ref Range   WBC 5.1 4.0 - 10.5 K/uL   RBC 3.92 3.87 - 5.11 MIL/uL   Hemoglobin 11.0 (L) 12.0 - 15.0 g/dL   HCT 36.3 36.0 - 46.0 %   MCV 92.6 80.0 - 100.0 fL   MCH 28.1 26.0 - 34.0 pg  MCHC 30.3 30.0 - 36.0 g/dL   RDW 16.0 (H) 11.5 - 15.5 %   Platelets 140 (L) 150 - 400 K/uL   nRBC 0.0 0.0 - 0.2 %   Neutrophils Relative % 58  %   Neutro Abs 3.0 1.7 - 7.7 K/uL   Lymphocytes Relative 27 %   Lymphs Abs 1.4 0.7 - 4.0 K/uL   Monocytes Relative 10 %   Monocytes Absolute 0.5 0.1 - 1.0 K/uL   Eosinophils Relative 4 %   Eosinophils Absolute 0.2 0.0 - 0.5 K/uL   Basophils Relative 1 %   Basophils Absolute 0.0 0.0 - 0.1 K/uL   Immature Granulocytes 0 %   Abs Immature Granulocytes 0.01 0.00 - 0.07 K/uL    Comment: Performed at [facility], [address] [place], [place] [address]  Thyroglobulin Level     Status: Abnormal   Collection Time: 07/22/18 11:27 AM  Result Value Ref Range   Thyroglobulin 0.3 (L) ng/mL    Comment:       Reference Range:       Intact Thyroid   2.8-40.9       Athyrotic        <0.1 .       Note: Abnormal flagging is based       on the reference interval for        patients with intact thyroid. . . This test was performed using the Beckman Coulter  chemiluminescent method. Values obtained from different assay methods cannot be used interchangeably. Thyroglobulin levels, regardless of value, should not be interpreted as absolute evidence of the presence or absence of disease. .    Comment      Comment: . Thyroglobulin antibodies (TGAB) interfere with thyroglobulin (TG) assays; therefore, TGAB assay should always be performed in conjunction with a TG assay. .   T4, free     Status: None   Collection Time: 07/22/18 11:27 AM  Result Value Ref Range   Free T4 1.2 0.8 - 1.8 ng/dL  TSH     Status: None   Collection Time: 07/22/18 11:27 AM  Result Value Ref Range   TSH 0.40 0.40 - 4.50 mIU/L  Thyroglobulin antibody     Status: None   Collection Time: 07/22/18 11:27 AM  Result Value Ref Range   Thyroglobulin Ab <1 < or = 1 IU/mL       Diagnosis 1. Thyroid, lobectomy, left lobe and isthmus - PAPILLARY THYROID CARCINOMA, FOLLICULAR VARIANT, 1.3 CM. - TUMOR CONFINED WITHIN THYROID CAPSULE. - MARGINS NOT INVOLVED. 2. Thyroid, lobectomy, right lobe - PAPILLARY THYROID  CARCINOMA, FOLLICULAR VARIANT, 2.6 CM. - TUMOR FOCALLY LESS THAN 0.1 CM FROM MARGIN.   Pathologic Stage Classification (pTNM, AJCC 8th Edition): pT2(multifocal), pNX.   Thyrogen stimulated whole-body scan on June 23, 2018 FINDINGS: Radio iodine accumulation is seen at the thyroid bed bilaterally consistent with thyroid remnant. No distant sites of abnormal radio iodine accumulation are identified to suggest iodine-avid metastatic thyroid cancer.  IMPRESSION: Thyroid remnant.  No scintigraphic evidence of iodine-avid thyroid cancer metastases.   Assessment & Plan:   1.  Follicular variant of papillary thyroid cancer  -She is status post negative her thyroidectomy on March 10, 2018 with Dr. Aviva Signs resulting in  Pathologic Stage Classification (pTNM, AJCC 8th Edition): pT2(multifocal), pNX.  -She he is status post radioactive iodine remnant ablation followed by whole body scan which was completed on June 24, 2018 at [facility].  -The whole body scan revealed  some thyroid remnant in the thyroid bed, no evidence of distant metastasis. -She would not need any further intervention at this time, -I had a long discussion with her regarding the need for continued monitoring with yearly imaging studies for next 5-10 years.  Her thyroglobulin levels are low at 0.3 with negative thyroglobulin antibodies.  2.  Postsurgical hypothyroidism  -Her previsit thyroid function tests are consistent with appropriate replacement and suppression of TSH at 0.4.    -She is advised to continue Synthroid 100 mcg p.o. every morning.   - We discussed about correct intake of levothyroxine, at fasting, with water, separated by at least 30 minutes from breakfast, and separated by more than 4 hours from calcium, iron, multivitamins, acid reflux medications (PPIs). -Patient is made aware of the fact that thyroid hormone replacement is needed for life, dose to be adjusted by periodic monitoring of  thyroid function tests.  3.  Hypertension: -She has responded to hydrochlorthiazide added to her losartan with her blood pressure being on target at 130/83.   - I advised her  to maintain close follow up with Rebecca Norlander, DO for primary care needs.  - Time spent with the patient: 15 min, of which >50% was spent in reviewing her  current and  previous labs, previous treatments, and medications doses and developing a plan for long-term care.  Rebecca Scott participated in the discussions, expressed understanding, and voiced agreement with the above plans.  All questions were answered to her satisfaction. she is encouraged to contact clinic should she have any questions or concerns prior to her return visit.   Follow up plan: Return in about 6 months (around 01/27/2019) for Thyroid / Neck Ultrasound, Follow up with Pre-visit Labs.   Glade Lloyd, MD [facility] [address] [place], [place] [address] Phone: [phone]  Fax: [phone]     07/29/2018, 2:48 PM  This note was partially dictated with voice recognition software. Similar sounding words can be transcribed inadequately or may not  be corrected upon review.

## 2018-08-13 ENCOUNTER — Telehealth (INDEPENDENT_AMBULATORY_CARE_PROVIDER_SITE_OTHER): Payer: Self-pay | Admitting: Internal Medicine

## 2018-08-13 NOTE — Telephone Encounter (Signed)
Patient left message for you to call her at ph# [phone]

## 2018-08-13 NOTE — Telephone Encounter (Signed)
Patient called and  She has tried the Circle. 1 dose she could not tell a difference , she added the 2 nd dose and could not tell a difference. She is complaining of pain at her ribs , and it is worse than before , she has alot gas and c/o bloating.

## 2018-08-14 ENCOUNTER — Other Ambulatory Visit (INDEPENDENT_AMBULATORY_CARE_PROVIDER_SITE_OTHER): Payer: Self-pay | Admitting: *Deleted

## 2018-08-14 MED ORDER — DIPHENOXYLATE-ATROPINE 2.5-0.025 MG PO TABS: 1.0000 | ORAL_TABLET | Freq: Three times a day (TID) | ORAL | 0 refills | Status: DC

## 2018-08-14 NOTE — Telephone Encounter (Signed)
Per Dr.Rehman the patient is to stop the Questran. She may try the Lomotil on schedule - 1 po tid, a 2 weeks supply. If this works for her then we will call in a RX.Marland Kitchen Patient is aware.

## 2018-08-25 ENCOUNTER — Encounter (INDEPENDENT_AMBULATORY_CARE_PROVIDER_SITE_OTHER): Payer: Self-pay | Admitting: Physical Medicine and Rehabilitation

## 2018-08-25 ENCOUNTER — Encounter: Payer: Self-pay | Admitting: Family Medicine

## 2018-08-25 ENCOUNTER — Other Ambulatory Visit: Payer: Self-pay | Admitting: Family Medicine

## 2018-08-25 DIAGNOSIS — N644 Mastodynia: Secondary | ICD-10-CM

## 2018-08-27 ENCOUNTER — Other Ambulatory Visit: Payer: Self-pay | Admitting: Physician Assistant

## 2018-08-27 DIAGNOSIS — N644 Mastodynia: Secondary | ICD-10-CM

## 2018-08-28 ENCOUNTER — Other Ambulatory Visit: Payer: Self-pay | Admitting: Family Medicine

## 2018-08-28 ENCOUNTER — Telehealth (INDEPENDENT_AMBULATORY_CARE_PROVIDER_SITE_OTHER): Payer: Self-pay | Admitting: *Deleted

## 2018-08-28 NOTE — Telephone Encounter (Signed)
Patient called with a PA report. She has been taking the Lomotil for 2 weeks and it is helping tremendous with the diarrhea.There are some days that she may not have a BM for 3 days. She is having a lot of bloating, and a headache every day since starting the Lomotil. She take one by mouth three times daily , 6 am , 2 pm , 10  Pm.. Stool are not as watery but are loose.When she does not have a BM in 3 days, when she goes to bathroom about 5 times.

## 2018-08-29 NOTE — Telephone Encounter (Signed)
Dr.Rehman recommends that the patient drop the Lomotil down to 2 a day and keep this on a schedule.. Patient was made aware

## 2018-09-03 IMAGING — US US ABDOMEN LIMITED
1 series · 14 of 25 positions shown · non-contrast
Comparison: CT, 06/27/2017.

CLINICAL DATA: Right upper quadrant abdominal pain. History of a
cholecystectomy. Evaluate duct stones.

EXAM:
ULTRASOUND ABDOMEN LIMITED RIGHT UPPER QUADRANT

[Series 1: us abdomen limited · 0.22mm/px · 14 of 45 slices shown]
[im 1/45]
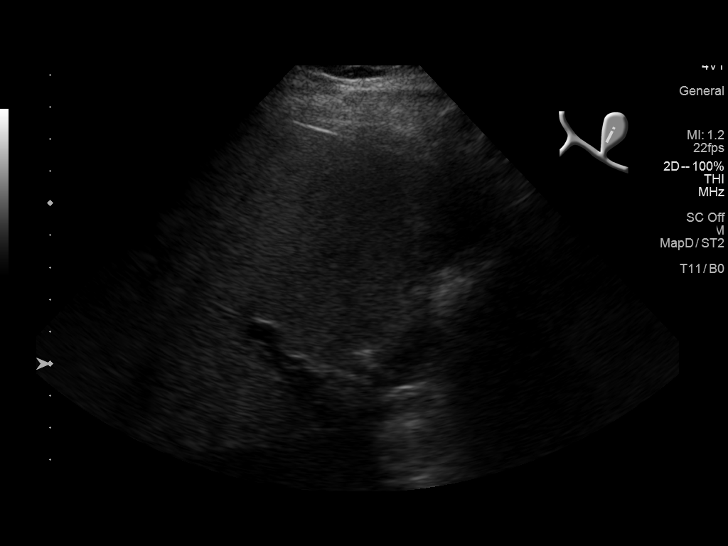
[im 4/45]
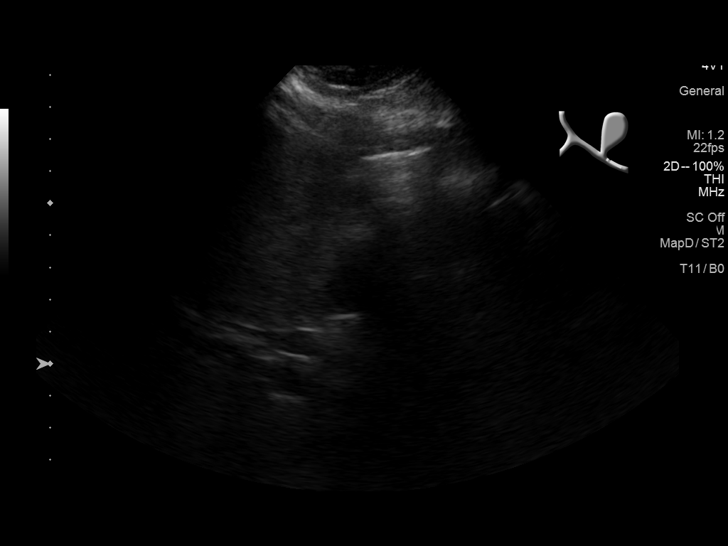
[im 8/45]
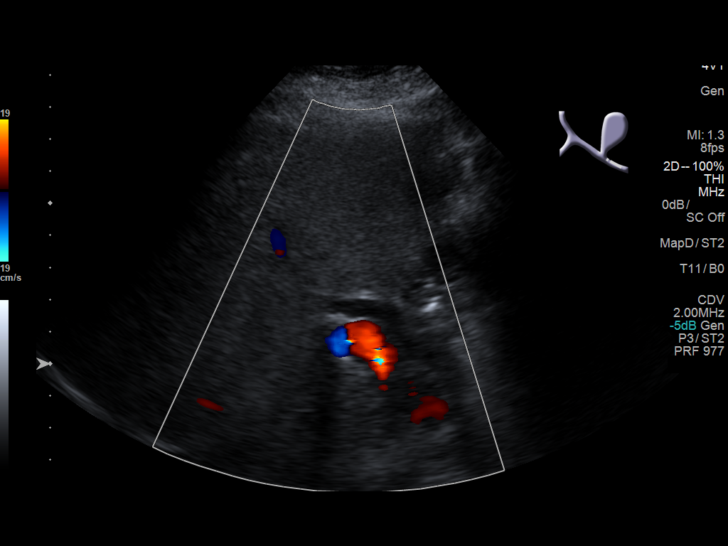
[im 12/45]
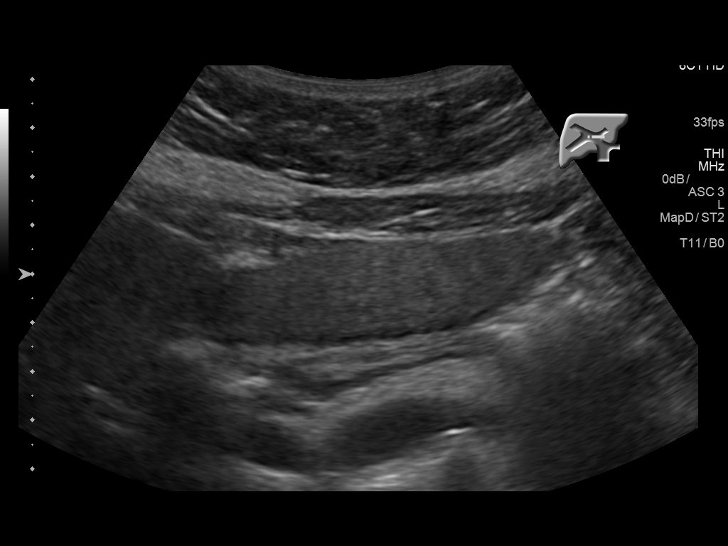
[im 15/45]
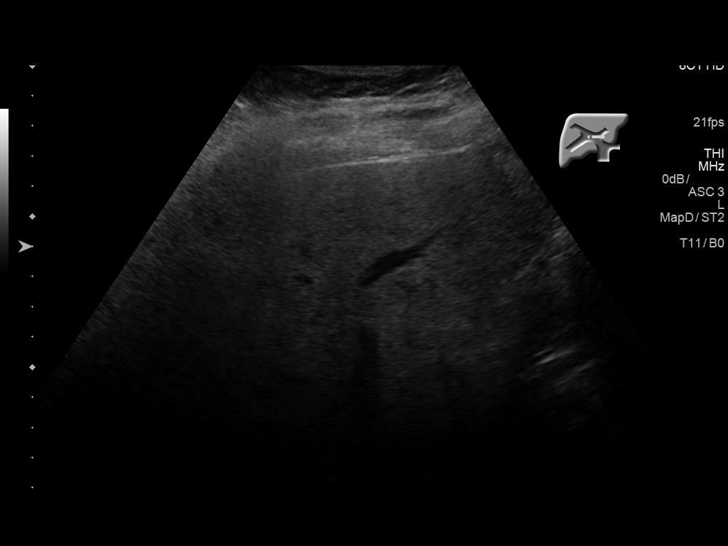
[im 17/45]
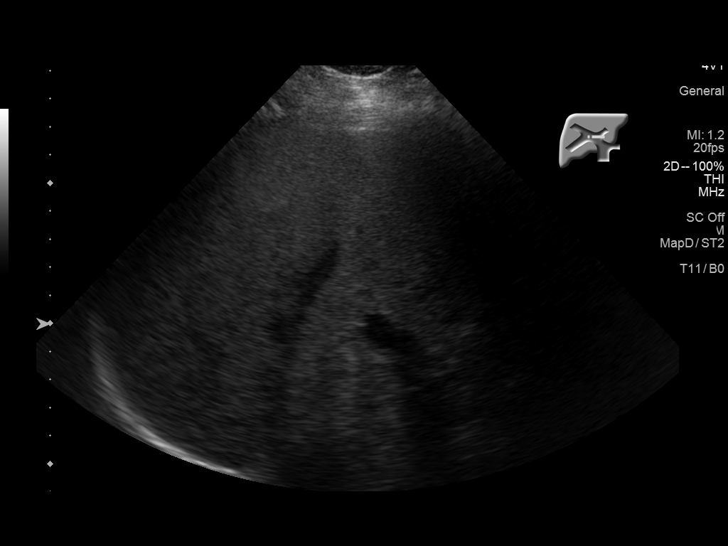
[im 21/45]
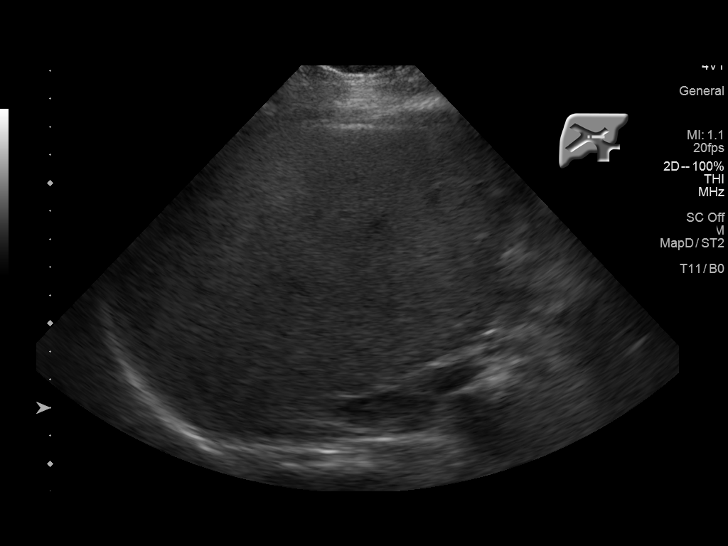
[im 24/45]
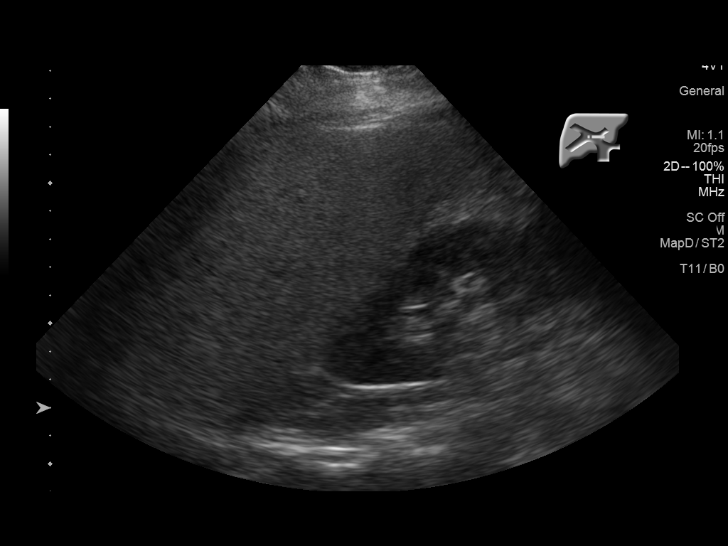
[im 28/45]
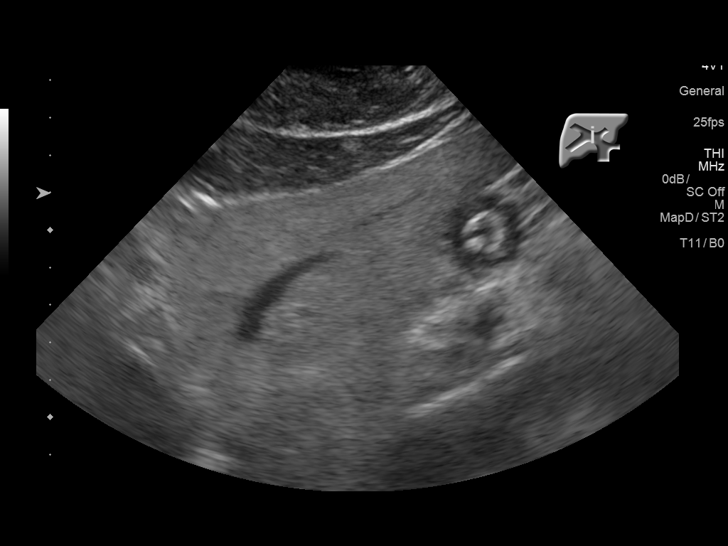
[im 30/45]
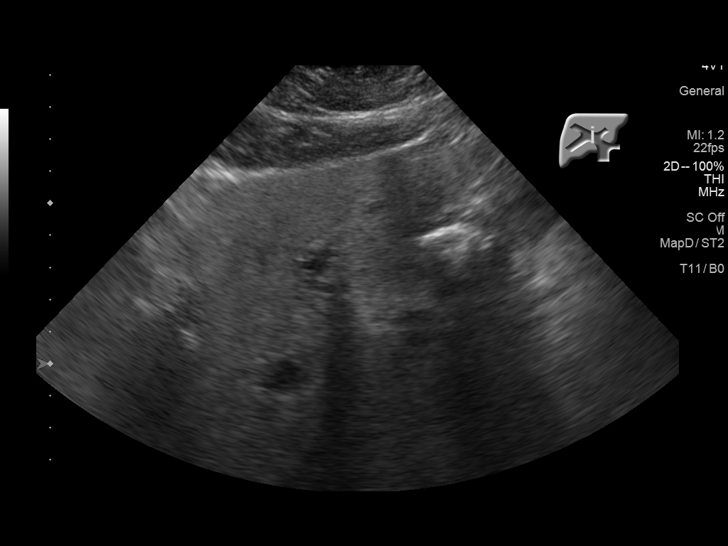
[im 34/45]
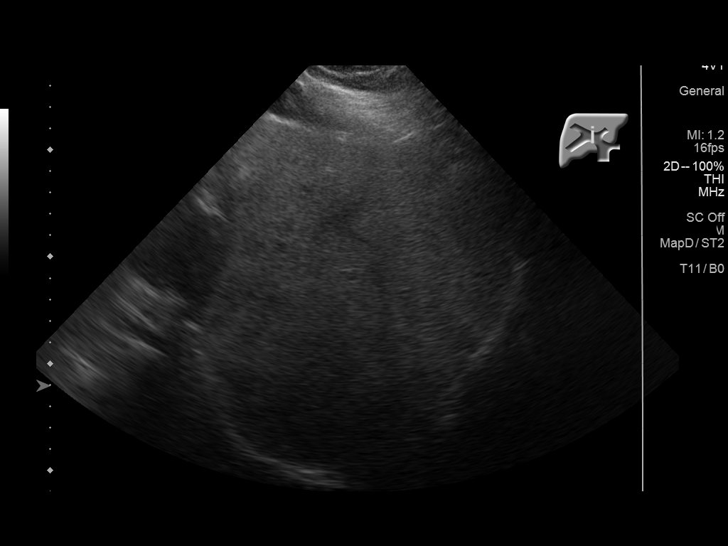
[im 37/45]
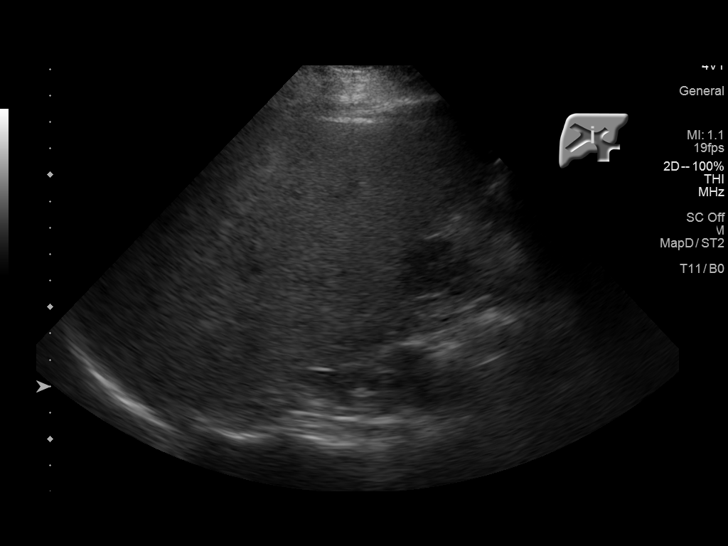
[im 41/45]
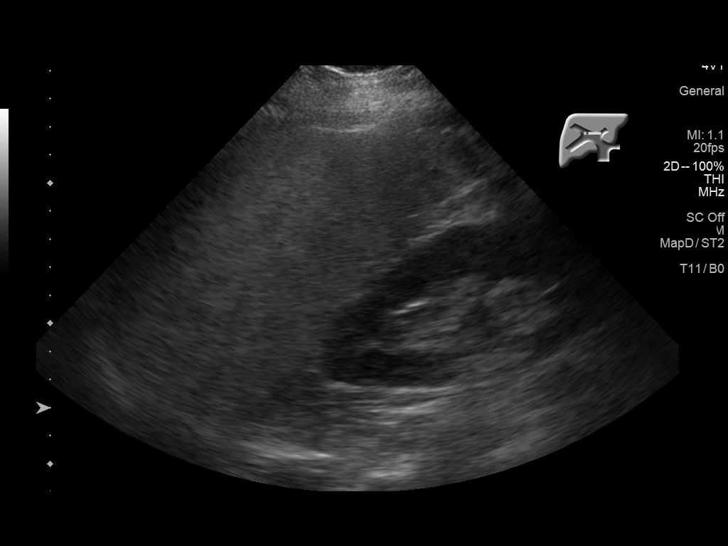
[im 45/45]
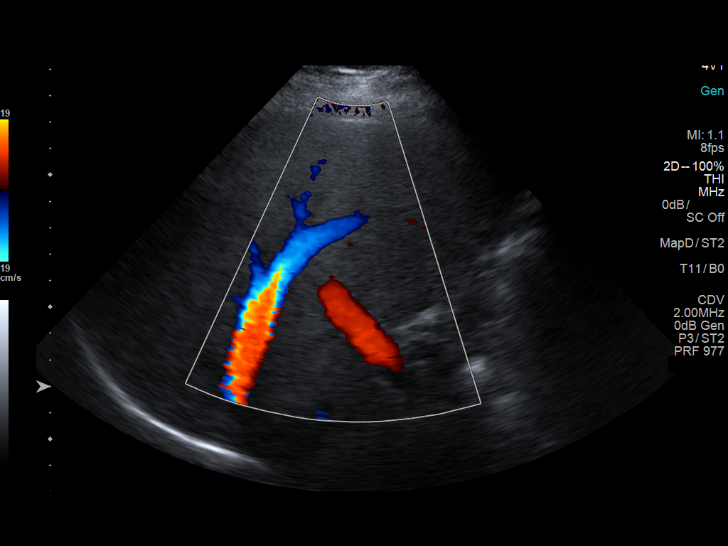

[14 of 25 positions shown; findings below may reference images not displayed]

FINDINGS: Gallbladder:

Surgically absent.

Common bile duct:

Diameter: 7 mm.  No evidence of a duct stone.

Liver:

Coarsened parenchymal echotexture with increased parenchymal
echogenicity. No mass or focal lesion. Portal vein is patent on
color Doppler imaging with normal direction of blood flow towards
the liver.
IMPRESSION: 1. No acute findings.  No evidence of a common bile duct stone.
2. Hepatic steatosis.

## 2018-09-04 ENCOUNTER — Other Ambulatory Visit (INDEPENDENT_AMBULATORY_CARE_PROVIDER_SITE_OTHER): Payer: Self-pay | Admitting: Internal Medicine

## 2018-09-05 ENCOUNTER — Telehealth: Payer: Self-pay | Admitting: Family Medicine

## 2018-09-05 NOTE — Telephone Encounter (Signed)
Ok to put lab orders in?

## 2018-09-05 NOTE — Telephone Encounter (Signed)
Pt is wanting to come in before her CPE on  09/16/2018 to have lab work, please call once orders are placed

## 2018-09-08 ENCOUNTER — Ambulatory Visit (INDEPENDENT_AMBULATORY_CARE_PROVIDER_SITE_OTHER): Payer: Self-pay

## 2018-09-08 ENCOUNTER — Ambulatory Visit (INDEPENDENT_AMBULATORY_CARE_PROVIDER_SITE_OTHER): Payer: 59 | Admitting: Orthopaedic Surgery

## 2018-09-08 ENCOUNTER — Other Ambulatory Visit: Payer: 59

## 2018-09-08 ENCOUNTER — Other Ambulatory Visit: Payer: Self-pay | Admitting: Family Medicine

## 2018-09-08 ENCOUNTER — Encounter (INDEPENDENT_AMBULATORY_CARE_PROVIDER_SITE_OTHER): Payer: Self-pay | Admitting: Orthopaedic Surgery

## 2018-09-08 VITALS — BP 173/103 | HR 63 | Ht 63.0 in | Wt 205.0 lb

## 2018-09-08 DIAGNOSIS — M25511 Pain in right shoulder: Secondary | ICD-10-CM | POA: Diagnosis not present

## 2018-09-08 DIAGNOSIS — R7303 Prediabetes: Secondary | ICD-10-CM

## 2018-09-08 DIAGNOSIS — M4722 Other spondylosis with radiculopathy, cervical region: Secondary | ICD-10-CM | POA: Diagnosis not present

## 2018-09-08 DIAGNOSIS — M542 Cervicalgia: Secondary | ICD-10-CM

## 2018-09-08 DIAGNOSIS — I10 Essential (primary) hypertension: Secondary | ICD-10-CM

## 2018-09-08 DIAGNOSIS — E782 Mixed hyperlipidemia: Secondary | ICD-10-CM

## 2018-09-08 DIAGNOSIS — Z130 Encounter for screening for diseases of the blood and blood-forming organs and certain disorders involving the immune mechanism: Secondary | ICD-10-CM

## 2018-09-08 DIAGNOSIS — Z1159 Encounter for screening for other viral diseases: Secondary | ICD-10-CM

## 2018-09-08 LAB — BAYER DCA HB A1C WAIVED: HB A1C (BAYER DCA - WAIVED): 5.1 % (ref <7.0)

## 2018-09-08 MED ORDER — PREDNISONE 5 MG PO TABS: 5.0000 mg | ORAL_TABLET | Freq: Every day | ORAL | 0 refills | Status: DC

## 2018-09-08 NOTE — Telephone Encounter (Signed)
No problem.  Labs have been placed.

## 2018-09-08 NOTE — Progress Notes (Addendum)
Office Visit Note   Patient: Rebecca Scott           Date of Birth: 1955/11/12           MRN: [id number] Visit Date: 09/08/2018              Requested by: Janora Norlander, DO [place], [place] [id number] PCP: Janora Norlander, DO   Assessment & Plan: Visit Diagnoses:  1. Neck pain   2. Acute pain of right shoulder     Plan: Likely flare of cervical spondylosis C5-6 C6-7 with right upper extremity radicular symptoms.  She has absent brachioradialis reflex.  We will place her on a prednisone Dosepak home cervical traction office follow-up in a week.  She is having persistent problems and pain is unrelenting and will consider diagnostic imaging.  Plain C-spine x-rays show no evidence of metastatic disease with her history of renal and thyroid cancer.  Follow-Up Instructions: No follow-ups on file.   Orders:  Orders Placed This Encounter  Procedures  . XR Cervical Spine 2 or 3 views  . XR Shoulder Right   Meds ordered this encounter  Medications  . predniSONE (DELTASONE) 5 MG tablet    Sig: Take 1 tablet (5 mg total) by mouth daily with breakfast. Take as directed. 6,5,4,3,2,1 po daily with food.    Dispense:  21 tablet    Refill:  0      Procedures: No procedures performed   Clinical Data: No additional findings.   Subjective: Chief Complaint  Patient presents with  . Neck - Pain  . Right Shoulder - Pain    HPI 62 year old female with severe neck and right shoulder pain to the point of tears despite taking hydrocodone present for over a week.  She had some pain over couple days then help her son stretch some [place] on the trampoline out to install it for Christmas and then started having significant increase in pain.  Pain severe enough she has been nauseated.  She has pain with rotation of her neck and pain radiates into her right shoulder down to her elbow and stops at the elbow.  She is not noticed any limitation of range of motion of her arm.   She notes when she turns her neck it is very painful.  Blood pressure elevated today secondary to pain 173/103.  Shows normal lab work coming up and has past history of thyroid cancer post thyroidectomy and also history of renal carcinoma.  Review of Systems team point systems positive for IBS with history of diarrhea, GERD, abdominal pain, thyroid cancer post thyroidectomy 03/10/2018 Dr. Arnoldo Morale.  Robotic assisted partial nephrectomy November 2018 Dr. Tresa Moore, 5 for hypertension history of UTIs prediabetes A1c less than 6, history of hyperlipidemia, IBS otherwise negative is a pertains to HPI.   Objective: Vital Signs: BP (!) 173/103   Pulse 63   Ht 5\' 3"  (1.6 m)   Wt 205 lb (93 kg)   BMI 36.31 kg/m   Physical Exam Constitutional:      Appearance: She is well-developed.     Comments: Anxious and in pain.  HENT:     Head: Normocephalic.     Right Ear: External ear normal.     Left Ear: External ear normal.  Eyes:     Pupils: Pupils are equal, round, and reactive to light.  Neck:     Thyroid: No thyromegaly.     Trachea: No tracheal deviation.  Cardiovascular:  Rate and Rhythm: Normal rate.  Pulmonary:     Effort: Pulmonary effort is normal.  Abdominal:     Palpations: Abdomen is soft.  Skin:    General: Skin is warm and dry.  Neurological:     Mental Status: She is alert and oriented to person, place, and time.  Psychiatric:        Behavior: Behavior normal.     Comments: Anxious and in obvious pain.     Ortho Exam increased pain with cervical compression relief with distraction.  Exquisite brachial plexus tenderness on the right positive Spurling on the right negative on the left.  Absent brachioradialis reflex on the right other reflexes biceps triceps opposite left brachial radialis is 2+ and symmetrical no lower extremity clonus normal heel toe gait.  Negative impingement of the shoulder mild long head of the biceps tenderness.  Mild tenderness over the deltoid insertion  site minimal discomfort with resisted supraspinatus testing no shoulder subluxation, clavicular joints normal no supraclavicular lymphadenopathy no rash over exposed skin.  Specialty Comments:  No specialty comments available.  Imaging: No results found.   PMFS History: Patient Active Problem List   Diagnosis Date Noted  . Degenerative disc disease at L5-S1 level 06/06/2018  . Malignant neoplasm of thyroid gland ([place]) 05/27/2018  . Postsurgical hypothyroidism 05/27/2018  . Hypokalemia 04/20/2018  . Diarrhea 04/20/2018  . Hx of papillary thyroid carcinoma 03/26/2018  . S/P total thyroidectomy 03/10/2018  . Obstructive sleep apnea 03/05/2018  . Essential hypertension, benign 02/18/2018  . Aortic atherosclerosis ([place]) 01/24/2018  . History of renal cell carcinoma 12/03/2017  . Reactive depression 10/22/2017  . Prediabetes 08/21/2017  . Gastroesophageal reflux disease without esophagitis 07/05/2017  . Hyperlipidemia 07/29/2014  . Syncope 07/29/2014  . Mixed stress and urge urinary incontinence 01/14/2014  . Recurrent UTI 01/14/2014  . Vaginal atrophy 01/14/2014  . Benign lesion of eyelid 02/07/2012   Past Medical History:  Diagnosis Date  . Anemia   . Arthritis   . Asthma    seasonal asthma rarely   . Chronic kidney disease    renal mass right kidney   . CPAP (continuous positive airway pressure) dependence   . Diverticulitis   . GERD (gastroesophageal reflux disease)   . Hemorrhoids   . History of kidney stones   . HTN (hypertension) 07/29/2014  . Hyperlipemia   . Hypertension   . Migraine   . Multinodular goiter 08/21/2017   Last Assessment & Plan:  She will need an f/u u/s of thyroid to reassess nodules. Repeat in 07/2018 Nodules are too small for biopsy now.  . Papillary renal cell carcinoma ([place]) 05/2017   s/p partial nephrectomy, right  . Papillary thyroid carcinoma ([place]) 03/10/2018  . PONV (postoperative nausea and vomiting)    patient reports being "slower to  wake than average "   . Pre-diabetes    "ive been told i'm borderline"   . Sleep apnea    CPAP use     Family History  Problem Relation Age of Onset  . COPD Mother   . Diabetes Mother   . Hypertension Mother   . High Cholesterol Mother   . Liver cancer Mother   . COPD Sister   . Hypertension Brother   . High Cholesterol Brother   . Hypertension Brother   . Asthma Son     Past Surgical History:  Procedure Laterality Date  . ABDOMINAL HYSTERECTOMY    . APPENDECTOMY  1978  . BIOPSY  07/08/2017  Procedure: BIOPSY;  Surgeon: Rogene Houston, MD;  Location: [facility];  Service: Endoscopy;;  gastric  . CHOLECYSTECTOMY  2005  . COLONOSCOPY WITH PROPOFOL N/A 10/28/2017   Procedure: COLONOSCOPY WITH PROPOFOL;  Surgeon: Rogene Houston, MD;  Location: [facility];  Service: Endoscopy;  Laterality: N/A;  7:30  . ESOPHAGOGASTRODUODENOSCOPY N/A 07/08/2017   Procedure: ESOPHAGOGASTRODUODENOSCOPY (EGD);  Surgeon: Rogene Houston, MD;  Location: [facility];  Service: Endoscopy;  Laterality: N/A;  220  . HIP ARTHROPLASTY    . NASAL SEPTUM SURGERY    . ROBOTIC ASSITED PARTIAL NEPHRECTOMY Right 05/17/2017   Procedure: XI ROBOTIC ASSITED PARTIAL NEPHRECTOMY;  Surgeon: Alexis Frock, MD;  Location: [facility];  Service: Urology;  Laterality: Right;  . THYROIDECTOMY N/A 03/10/2018   Procedure: TOTAL THYROIDECTOMY;  Surgeon: Aviva Signs, MD;  Location: [facility];  Service: General;  Laterality: N/A;   Social History   Occupational History  . Not on file  Tobacco Use  . Smoking status: Never Smoker  . Smokeless tobacco: Never Used  Substance and Sexual Activity  . Alcohol use: Yes    Comment: rarely   . Drug use: No  . Sexual activity: Not Currently

## 2018-09-08 NOTE — Telephone Encounter (Signed)
Aware. 

## 2018-09-09 ENCOUNTER — Encounter (HOSPITAL_COMMUNITY): Payer: Self-pay

## 2018-09-09 ENCOUNTER — Ambulatory Visit (HOSPITAL_COMMUNITY)
Admission: RE | Admit: 2018-09-09 | Discharge: 2018-09-09 | Disposition: A | Discharge status: Home / Self Care | Payer: 59 | Source: Ambulatory Visit | Attending: Physician Assistant | Admitting: Physician Assistant

## 2018-09-09 ENCOUNTER — Ambulatory Visit (HOSPITAL_COMMUNITY): Admission: RE | Admit: 2018-09-09 | Payer: 59 | Source: Ambulatory Visit

## 2018-09-09 ENCOUNTER — Ambulatory Visit (HOSPITAL_COMMUNITY)
Admission: RE | Admit: 2018-09-09 | Discharge: 2018-09-09 | Disposition: A | Discharge status: Home / Self Care | Payer: 59 | Source: Ambulatory Visit | Attending: Family Medicine | Admitting: Family Medicine

## 2018-09-09 DIAGNOSIS — N644 Mastodynia: Secondary | ICD-10-CM | POA: Insufficient documentation

## 2018-09-09 LAB — CMP14+EGFR
ALT: 25 IU/L (ref 0–32)
AST: 22 IU/L (ref 0–40)
Albumin/Globulin Ratio: 2 (ref 1.2–2.2)
Albumin: 4.5 g/dL (ref 3.6–4.8)
Alkaline Phosphatase: 98 IU/L (ref 39–117)
BUN/Creatinine Ratio: 9 — ABNORMAL LOW (ref 12–28)
BUN: 7 mg/dL — AB (ref 8–27)
Bilirubin Total: 0.3 mg/dL (ref 0.0–1.2)
CO2: 21 mmol/L (ref 20–29)
Calcium: 9.5 mg/dL (ref 8.7–10.3)
Chloride: 106 mmol/L (ref 96–106)
Creatinine, Ser: 0.74 mg/dL (ref 0.57–1.00)
GFR calc Af Amer: 101 mL/min/{1.73_m2} (ref >59)
GFR calc non Af Amer: 88 mL/min/{1.73_m2} (ref >59)
Globulin, Total: 2.3 g/dL (ref 1.5–4.5)
Glucose: 103 mg/dL — ABNORMAL HIGH (ref 65–99)
Potassium: 3.8 mmol/L (ref 3.5–5.2)
Sodium: 144 mmol/L (ref 134–144)
Total Protein: 6.8 g/dL (ref 6.0–8.5)

## 2018-09-09 LAB — LIPID PANEL
CHOL/HDL RATIO: 3 ratio (ref 0.0–4.4)
Cholesterol, Total: 174 mg/dL (ref 100–199)
HDL: 58 mg/dL (ref >39)
LDL Calculated: 99 mg/dL (ref 0–99)
Triglycerides: 84 mg/dL (ref 0–149)
VLDL Cholesterol Cal: 17 mg/dL (ref 5–40)

## 2018-09-09 LAB — CBC
Hematocrit: 35.9 % (ref 34.0–46.6)
Hemoglobin: 11.7 g/dL (ref 11.1–15.9)
MCH: 28.9 pg (ref 26.6–33.0)
MCHC: 32.6 g/dL (ref 31.5–35.7)
MCV: 89 fL (ref 79–97)
Platelets: 146 10*3/uL — ABNORMAL LOW (ref 150–450)
RBC: 4.05 x10E6/uL (ref 3.77–5.28)
RDW: 14.2 % (ref 12.3–15.4)
WBC: 5.4 10*3/uL (ref 3.4–10.8)

## 2018-09-09 LAB — HEPATITIS C ANTIBODY: Hep C Virus Ab: 0.1 s/co ratio (ref 0.0–0.9)

## 2018-09-11 ENCOUNTER — Telehealth (INDEPENDENT_AMBULATORY_CARE_PROVIDER_SITE_OTHER): Payer: Self-pay | Admitting: Radiology

## 2018-09-11 ENCOUNTER — Other Ambulatory Visit (INDEPENDENT_AMBULATORY_CARE_PROVIDER_SITE_OTHER): Payer: Self-pay | Admitting: Orthopaedic Surgery

## 2018-09-11 DIAGNOSIS — M4722 Other spondylosis with radiculopathy, cervical region: Secondary | ICD-10-CM

## 2018-09-11 MED ORDER — HYDROCODONE-ACETAMINOPHEN 5-325 MG PO TABS: 1.0000 | ORAL_TABLET | Freq: Four times a day (QID) | ORAL | 0 refills | Status: DC | PRN

## 2018-09-11 NOTE — Telephone Encounter (Signed)
Proceed with MRI cervical ROV after scan thanks

## 2018-09-11 NOTE — Telephone Encounter (Signed)
Patient called stating she was told to call back today if she was not doing any better. She has been taking the prednisone and doing home cervical traction. She is actually experiencing more pain in her arm which seems to be radiating further down the arm than it was previously. She also having some numbness in her hands.  Please advise.  Per 09/08/18 office note, if she continued with problems, would consider diagnostic imaging.

## 2018-09-11 NOTE — Telephone Encounter (Signed)
MRI order entered. Patient has taken hydrocodone 5/325 that she had left over from thyroid surgery with slight relief. She only has 2 left. Patient uses [facility]. She will follow up in the office to review MRI.

## 2018-09-16 ENCOUNTER — Encounter: Payer: Self-pay | Admitting: Family Medicine

## 2018-09-16 ENCOUNTER — Ambulatory Visit (INDEPENDENT_AMBULATORY_CARE_PROVIDER_SITE_OTHER): Payer: 59 | Admitting: Family Medicine

## 2018-09-16 VITALS — BP 137/82 | HR 67 | Temp 97.0°F | Ht 63.0 in | Wt 207.0 lb

## 2018-09-16 DIAGNOSIS — Z23 Encounter for immunization: Secondary | ICD-10-CM

## 2018-09-16 DIAGNOSIS — Z124 Encounter for screening for malignant neoplasm of cervix: Secondary | ICD-10-CM | POA: Diagnosis not present

## 2018-09-16 DIAGNOSIS — N393 Stress incontinence (female) (male): Secondary | ICD-10-CM

## 2018-09-16 DIAGNOSIS — Z0001 Encounter for general adult medical examination with abnormal findings: Secondary | ICD-10-CM

## 2018-09-16 DIAGNOSIS — Z0000 Encounter for general adult medical examination without abnormal findings: Secondary | ICD-10-CM

## 2018-09-16 MED ORDER — POTASSIUM CHLORIDE CRYS ER 20 MEQ PO TBCR: 20.0000 meq | EXTENDED_RELEASE_TABLET | Freq: Every day | ORAL | 3 refills | Status: DC

## 2018-09-16 MED ORDER — LOSARTAN POTASSIUM 100 MG PO TABS: 100.0000 mg | ORAL_TABLET | Freq: Every day | ORAL | 3 refills | Status: DC

## 2018-09-16 MED ORDER — DULOXETINE HCL 60 MG PO CPEP: 60.0000 mg | ORAL_CAPSULE | Freq: Every day | ORAL | 3 refills | Status: DC

## 2018-09-16 MED ORDER — ROSUVASTATIN CALCIUM 5 MG PO TABS: 5.0000 mg | ORAL_TABLET | Freq: Every day | ORAL | 3 refills | Status: DC

## 2018-09-16 NOTE — Patient Instructions (Signed)
Urinary Incontinence  Urinary incontinence refers to a condition in which a person is unable to control where and when to pass urine. A person with this condition will urinate when he or she does not mean to (involuntarily). What are the causes? This condition may be caused by:  Medicines.  Infections.  Constipation.  Overactive bladder muscles.  Weak bladder muscles.  Weak pelvic floor muscles. These muscles provide support for the bladder, intestine, and, in women, the uterus.  Enlarged prostate in men. The prostate is a gland near the bladder. When it gets too big, it can pinch the urethra. With the urethra blocked, the bladder can weaken and lose the ability to empty properly.  Surgery.  Emotional factors, such as anxiety, stress, or post-traumatic stress disorder (PTSD).  Pelvic organ prolapse. This happens in women when organs shift out of place and into the vagina. This shift can prevent the bladder and urethra from working properly. What increases the risk? The following factors may make you more likely to develop this condition:  Older age.  Obesity and physical inactivity.  Pregnancy and childbirth.  Menopause.  Diseases that affect the nerves or spinal cord (neurological diseases).  Long-term (chronic) coughing. This can increase pressure on the bladder and pelvic floor muscles. What are the signs or symptoms? Symptoms may vary depending on the type of urinary incontinence you have. They include:  A sudden urge to urinate, but passing urine involuntarily before you can get to a bathroom (urge incontinence).  Suddenly passing urine with any activity that forces urine to pass, such as coughing, laughing, exercise, or sneezing (stress incontinence).  Needing to urinate often, but urinating only a small amount, or constantly dribbling urine (overflow incontinence).  Urinating because you cannot get to the bathroom in time due to a physical disability, such as  arthritis or injury, or communication and thinking problems, such as Alzheimer disease (functional incontinence). How is this diagnosed? This condition may be diagnosed based on:  Your medical history.  A physical exam.  Tests, such as: ? Urine tests. ? X-rays of your kidney and bladder. ? Ultrasound. ? CT scan. ? Cystoscopy. In this procedure, a health care provider inserts a tube with a light and camera (cystoscope) through the urethra and into the bladder in order to check for problems. ? Urodynamic testing. These tests assess how well the bladder, urethra, and sphincter can store and release urine. There are different types of urodynamic tests, and they vary depending on what the test is measuring. To help diagnose your condition, your health care provider may recommend that you keep a log of when you urinate and how much you urinate. How is this treated? Treatment for this condition depends on the type of incontinence that you have and its cause. Treatment may include:  Lifestyle changes, such as: ? Quitting smoking. ? Maintaining a healthy weight. ? Staying active. Try to get 150 minutes of moderate-intensity exercise every week. Ask your health care provider which activities are safe for you. ? Eating a healthy diet.  Avoid high-fat foods, like fried foods.  Avoid refined carbohydrates like white bread and white rice.  Limit how much alcohol and caffeine you drink.  Increase your fiber intake. Foods such as fresh fruits, vegetables, beans, and whole grains are healthy sources of fiber.  Pelvic floor muscle exercises.  Bladder training, such as lengthening the amount of time between bathroom breaks, or using the bathroom at regular intervals.  Using techniques to suppress bladder urges.  This can include distraction techniques or controlled breathing exercises.  Medicines to relax the bladder muscles and prevent bladder spasms.  Medicines to help slow or prevent the  growth of a man's prostate.  Botox injections. These can help relax the bladder muscles.  Using pulses of electricity to help change bladder reflexes (electrical nerve stimulation).  For women, using a medical device to prevent urine leaks. This is a small, tampon-like, disposable device that is inserted into the urethra.  Injecting collagen or carbon beads (bulking agents) into the urinary sphincter. These can help thicken tissue and close the bladder opening.  Surgery. Follow these instructions at home: Lifestyle  Limit alcohol and caffeine. These can fill your bladder quickly and irritate it.  Keep yourself clean to help prevent odors and skin damage. Ask your doctor about special skin creams and cleansers that can protect the skin from urine.  Consider wearing pads or adult diapers. Make sure to change them regularly, and always change them right after experiencing incontinence. General instructions  Take over-the-counter and prescription medicines only as told by your health care provider.  Use the bathroom about every 3-4 hours, even if you do not feel the need to urinate. Try to empty your bladder completely every time. After urinating, wait a minute. Then try to urinate again.  Make sure you are in a relaxed position while urinating.  If your incontinence is caused by nerve problems, keep a log of the medicines you take and the times you go to the bathroom.  Keep all follow-up visits as told by your health care provider. This is important. Contact a health care provider if:  You have pain that gets worse.  Your incontinence gets worse. Get help right away if:  You have a fever or chills.  You are unable to urinate.  You have redness in your groin area or down your legs. Summary  Urinary incontinence refers to a condition in which a person is unable to control where and when to pass urine.  This condition may be caused by medicines, infection, weak bladder  muscles, weak pelvic floor muscles, enlargement of the prostate (in men), or surgery.  The following factors increase your risk for developing this condition: older age, obesity, pregnancy and childbirth, menopause, neurological diseases, and chronic coughing.  There are several types of urinary incontinence. They include urge incontinence, stress incontinence, overflow incontinence, and functional incontinence.  This condition is usually treated first with lifestyle and behavioral changes, such as quitting smoking, eating a healthier diet, and doing regular pelvic floor exercises. Other treatment options include medicines, bulking agents, medical devices, electrical nerve stimulation, or surgery. This information is not intended to replace advice given to you by your health care provider. Make sure you discuss any questions you have with your health care provider. Document Released: 10/04/2004 Document Revised: 12/06/2016 Document Reviewed: 12/06/2016 Elsevier Interactive Patient Education  2019 [place] Maintenance, Female Adopting a healthy lifestyle and getting preventive care can go a long way to promote health and wellness. Talk with your health care provider about what schedule of regular examinations is right for you. This is a good chance for you to check in with your provider about disease prevention and staying healthy. In between checkups, there are plenty of things you can do on your own. Experts have done a lot of research about which lifestyle changes and preventive measures are most likely to keep you healthy. Ask your health care provider for more information. Weight and  diet Eat a healthy diet  Be sure to include plenty of vegetables, fruits, low-fat dairy products, and lean protein.  Do not eat a lot of foods high in solid fats, added sugars, or salt.  Get regular exercise. This is one of the most important things you can do for your health. ? Most adults should  exercise for at least 150 minutes each week. The exercise should increase your heart rate and make you sweat (moderate-intensity exercise). ? Most adults should also do strengthening exercises at least twice a week. This is in addition to the moderate-intensity exercise. Maintain a healthy weight  Body mass index (BMI) is a measurement that can be used to identify possible weight problems. It estimates body fat based on height and weight. Your health care provider can help determine your BMI and help you achieve or maintain a healthy weight.  For females 70 years of age and older: ? A BMI below 18.5 is considered underweight. ? A BMI of 18.5 to 24.9 is normal. ? A BMI of 25 to 29.9 is considered overweight. ? A BMI of 30 and above is considered obese. Watch levels of cholesterol and blood lipids  You should start having your blood tested for lipids and cholesterol at 62 years of age, then have this test every 5 years.  You may need to have your cholesterol levels checked more often if: ? Your lipid or cholesterol levels are high. ? You are older than 61 years of age. ? You are at high risk for heart disease. Cancer screening Lung Cancer  Lung cancer screening is recommended for adults 6-105 years old who are at high risk for lung cancer because of a history of smoking.  A yearly low-dose CT scan of the lungs is recommended for people who: ? Currently smoke. ? Have quit within the past 15 years. ? Have at least a 30-pack-year history of smoking. A pack year is smoking an average of one pack of cigarettes a day for 1 year.  Yearly screening should continue until it has been 15 years since you quit.  Yearly screening should stop if you develop a health problem that would prevent you from having lung cancer treatment. Breast Cancer  Practice breast self-awareness. This means understanding how your breasts normally appear and feel.  It also means doing regular breast self-exams. Let  your health care provider know about any changes, no matter how small.  If you are in your 20s or 30s, you should have a clinical breast exam (CBE) by a health care provider every 1-3 years as part of a regular health exam.  If you are 32 or older, have a CBE every year. Also consider having a breast X-ray (mammogram) every year.  If you have a family history of breast cancer, talk to your health care provider about genetic screening.  If you are at high risk for breast cancer, talk to your health care provider about having an MRI and a mammogram every year.  Breast cancer gene (BRCA) assessment is recommended for women who have family members with BRCA-related cancers. BRCA-related cancers include: ? Breast. ? Ovarian. ? Tubal. ? Peritoneal cancers.  Results of the assessment will determine the need for genetic counseling and BRCA1 and BRCA2 testing. Cervical Cancer Your health care provider may recommend that you be screened regularly for cancer of the pelvic organs (ovaries, uterus, and vagina). This screening involves a pelvic examination, including checking for microscopic changes to the surface of your cervix (  Pap test). You may be encouraged to have this screening done every 3 years, beginning at age 53.  For women ages 33-65, health care providers may recommend pelvic exams and Pap testing every 3 years, or they may recommend the Pap and pelvic exam, combined with testing for human papilloma virus (HPV), every 5 years. Some types of HPV increase your risk of cervical cancer. Testing for HPV may also be done on women of any age with unclear Pap test results.  Other health care providers may not recommend any screening for nonpregnant women who are considered low risk for pelvic cancer and who do not have symptoms. Ask your health care provider if a screening pelvic exam is right for you.  If you have had past treatment for cervical cancer or a condition that could lead to cancer, you  need Pap tests and screening for cancer for at least 20 years after your treatment. If Pap tests have been discontinued, your risk factors (such as having a new sexual partner) need to be reassessed to determine if screening should resume. Some women have medical problems that increase the chance of getting cervical cancer. In these cases, your health care provider may recommend more frequent screening and Pap tests. Colorectal Cancer  This type of cancer can be detected and often prevented.  Routine colorectal cancer screening usually begins at 62 years of age and continues through 62 years of age.  Your health care provider may recommend screening at an earlier age if you have risk factors for colon cancer.  Your health care provider may also recommend using home test kits to check for hidden blood in the stool.  A small camera at the end of a tube can be used to examine your colon directly (sigmoidoscopy or colonoscopy). This is done to check for the earliest forms of colorectal cancer.  Routine screening usually begins at age 35.  Direct examination of the colon should be repeated every 5-10 years through 62 years of age. However, you may need to be screened more often if early forms of precancerous polyps or small growths are found. Skin Cancer  Check your skin from head to toe regularly.  Tell your health care provider about any new moles or changes in moles, especially if there is a change in a mole's shape or color.  Also tell your health care provider if you have a mole that is larger than the size of a pencil eraser.  Always use sunscreen. Apply sunscreen liberally and repeatedly throughout the day.  Protect yourself by wearing long sleeves, pants, a wide-brimmed hat, and sunglasses whenever you are outside. Heart disease, diabetes, and high blood pressure  High blood pressure causes heart disease and increases the risk of stroke. High blood pressure is more likely to develop  in: ? People who have blood pressure in the high end of the normal range (130-139/85-89 mm Hg). ? People who are overweight or obese. ? People who are African American.  If you are 52-48 years of age, have your blood pressure checked every 3-5 years. If you are 54 years of age or older, have your blood pressure checked every year. You should have your blood pressure measured twice-once when you are at a hospital or clinic, and once when you are not at a hospital or clinic. Record the average of the two measurements. To check your blood pressure when you are not at a hospital or clinic, you can use: ? An automated blood pressure machine  at a pharmacy. ? A home blood pressure monitor.  If you are between 68 years and 23 years old, ask your health care provider if you should take aspirin to prevent strokes.  Have regular diabetes screenings. This involves taking a blood sample to check your fasting blood sugar level. ? If you are at a normal weight and have a low risk for diabetes, have this test once every three years after 62 years of age. ? If you are overweight and have a high risk for diabetes, consider being tested at a younger age or more often. Preventing infection Hepatitis B  If you have a higher risk for hepatitis B, you should be screened for this virus. You are considered at high risk for hepatitis B if: ? You were born in a country where hepatitis B is common. Ask your health care provider which countries are considered high risk. ? Your parents were born in a high-risk country, and you have not been immunized against hepatitis B (hepatitis B vaccine). ? You have HIV or AIDS. ? You use needles to inject street drugs. ? You live with someone who has hepatitis B. ? You have had sex with someone who has hepatitis B. ? You get hemodialysis treatment. ? You take certain medicines for conditions, including cancer, organ transplantation, and autoimmune conditions. Hepatitis C  Blood  testing is recommended for: ? Everyone born from [birth year] through 1964. ? Anyone with known risk factors for hepatitis C. Sexually transmitted infections (STIs)  You should be screened for sexually transmitted infections (STIs) including gonorrhea and chlamydia if: ? You are sexually active and are younger than 62 years of age. ? You are older than 62 years of age and your health care provider tells you that you are at risk for this type of infection. ? Your sexual activity has changed since you were last screened and you are at an increased risk for chlamydia or gonorrhea. Ask your health care provider if you are at risk.  If you do not have HIV, but are at risk, it may be recommended that you take a prescription medicine daily to prevent HIV infection. This is called pre-exposure prophylaxis (PrEP). You are considered at risk if: ? You are sexually active and do not regularly use condoms or know the HIV status of your partner(s). ? You take drugs by injection. ? You are sexually active with a partner who has HIV. Talk with your health care provider about whether you are at high risk of being infected with HIV. If you choose to begin PrEP, you should first be tested for HIV. You should then be tested every 3 months for as long as you are taking PrEP. Pregnancy  If you are premenopausal and you may become pregnant, ask your health care provider about preconception counseling.  If you may become pregnant, take 400 to 800 micrograms (mcg) of folic acid every day.  If you want to prevent pregnancy, talk to your health care provider about birth control (contraception). Osteoporosis and menopause  Osteoporosis is a disease in which the bones lose minerals and strength with aging. This can result in serious bone fractures. Your risk for osteoporosis can be identified using a bone density scan.  If you are 6 years of age or older, or if you are at risk for osteoporosis and fractures, ask your health  care provider if you should be screened.  Ask your health care provider whether you should take a calcium or vitamin D  supplement to lower your risk for osteoporosis.  Menopause may have certain physical symptoms and risks.  Hormone replacement therapy may reduce some of these symptoms and risks. Talk to your health care provider about whether hormone replacement therapy is right for you. Follow these instructions at home:  Schedule regular health, dental, and eye exams.  Stay current with your immunizations.  Do not use any tobacco products including cigarettes, chewing tobacco, or electronic cigarettes.  If you are pregnant, do not drink alcohol.  If you are breastfeeding, limit how much and how often you drink alcohol.  Limit alcohol intake to no more than 1 drink per day for nonpregnant women. One drink equals 12 ounces of beer, 5 ounces of wine, or 1 ounces of hard liquor.  Do not use street drugs.  Do not share needles.  Ask your health care provider for help if you need support or information about quitting drugs.  Tell your health care provider if you often feel depressed.  Tell your health care provider if you have ever been abused or do not feel safe at home. This information is not intended to replace advice given to you by your health care provider. Make sure you discuss any questions you have with your health care provider. Document Released: 03/12/2011 Document Revised: 02/02/2016 Document Reviewed: 05/31/2015 Elsevier Interactive Patient Education  2019 [employer].

## 2018-09-16 NOTE — Addendum Note (Signed)
Addended byCarrolyn Leigh on: 09/16/2018 02:16 PM   Modules accepted: Orders

## 2018-09-16 NOTE — Progress Notes (Signed)
Rebecca Scott is a 63 y.o. female presents to office today for annual physical exam examination.    Concerns today include: 1. Incontinence Patient reports that as of late she has been having urinary incontinence, particularly related to needing to go to the restroom and not making it in time.  She has history of abdominal hysterectomy.  She has not done anything to improve symptoms yet.  At this time, she is not interested in adding additional medications.  Substance use: None Diet: Fair, Exercise: None at this time secondary to chronic back and neck pain Last eye exam: Sees ophthalmologist regularly Last dental exam: Has been greater than 1 year Last colonoscopy: UTD Last mammogram: UTD Last pap smear: needs Refills needed today: Crestor, Cymbalta, potassium and losartan Immunizations needed: TDap due  Past Medical History:  Diagnosis Date  . Anemia   . Arthritis   . Asthma    seasonal asthma rarely   . Chronic kidney disease    renal mass right kidney   . CPAP (continuous positive airway pressure) dependence   . Diverticulitis   . GERD (gastroesophageal reflux disease)   . Hemorrhoids   . History of kidney stones   . HTN (hypertension) 07/29/2014  . Hyperlipemia   . Hypertension   . Migraine   . Multinodular goiter 08/21/2017   Last Assessment & Plan:  She will need an f/u u/s of thyroid to reassess nodules. Repeat in 07/2018 Nodules are too small for biopsy now.  . Papillary renal cell carcinoma ([place]) 05/2017   s/p partial nephrectomy, right  . Papillary thyroid carcinoma ([place]) 03/10/2018  . PONV (postoperative nausea and vomiting)    patient reports being "slower to wake than average "   . Pre-diabetes    "ive been told i'm borderline"   . Sleep apnea    CPAP use    Social History   Socioeconomic History  . Marital status: Widowed    Spouse name: Not on file  . Number of children: Not on file  . Years of education: Not on file  . Highest education level:  Not on file  Occupational History  . Not on file  Social Needs  . Financial resource strain: Not on file  . Food insecurity:    Worry: Not on file    Inability: Not on file  . Transportation needs:    Medical: Not on file    Non-medical: Not on file  Tobacco Use  . Smoking status: Never Smoker  . Smokeless tobacco: Never Used  Substance and Sexual Activity  . Alcohol use: Yes    Comment: rarely   . Drug use: No  . Sexual activity: Not Currently  Lifestyle  . Physical activity:    Days per week: Not on file    Minutes per session: Not on file  . Stress: Not on file  Relationships  . Social connections:    Talks on phone: Not on file    Gets together: Not on file    Attends religious service: Not on file    Active member of club or organization: Not on file    Attends meetings of clubs or organizations: Not on file    Relationship status: Not on file  . Intimate partner violence:    Fear of current or ex partner: Not on file    Emotionally abused: Not on file    Physically abused: Not on file    Forced sexual activity: Not on file  Other  Topics Concern  . Not on file  Social History Narrative  . Not on file   Past Surgical History:  Procedure Laterality Date  . ABDOMINAL HYSTERECTOMY    . APPENDECTOMY  1978  . BIOPSY  07/08/2017   Procedure: BIOPSY;  Surgeon: Rogene Houston, MD;  [facility];  Service: Endoscopy;;  gastric  . CHOLECYSTECTOMY  2005  . COLONOSCOPY WITH PROPOFOL N/A 10/28/2017   Procedure: COLONOSCOPY WITH PROPOFOL;  Surgeon: Rogene Houston, MD;  [facility];  Service: Endoscopy;  Laterality: N/A;  7:30  . ESOPHAGOGASTRODUODENOSCOPY N/A 07/08/2017   Procedure: ESOPHAGOGASTRODUODENOSCOPY (EGD);  Surgeon: Rogene Houston, MD;  [facility];  Service: Endoscopy;  Laterality: N/A;  220  . HIP ARTHROPLASTY    . NASAL SEPTUM SURGERY    . ROBOTIC ASSITED PARTIAL NEPHRECTOMY Right 05/17/2017   Procedure: XI ROBOTIC  ASSITED PARTIAL NEPHRECTOMY;  Surgeon: Alexis Frock, MD;  [facility];  Service: Urology;  Laterality: Right;  . THYROIDECTOMY N/A 03/10/2018   Procedure: TOTAL THYROIDECTOMY;  Surgeon: Aviva Signs, MD;  Location: [facility];  Service: General;  Laterality: N/A;   Family History  Problem Relation Age of Onset  . COPD Mother   . Diabetes Mother   . Hypertension Mother   . High Cholesterol Mother   . Liver cancer Mother   . COPD Sister   . Hypertension Brother   . High Cholesterol Brother   . Hypertension Brother   . Asthma Son     Current Outpatient Medications:  .  baclofen (LIORESAL) 10 MG tablet, Take 10 mg by mouth daily. , Disp: , Rfl:  .  cholestyramine (QUESTRAN) 4 g packet, Take 1 packet (4 g total) by mouth 2 (two) times daily. Take this medication 2 hours before or after taking other medications., Disp: 60 each, Rfl: 2 .  clonazePAM (KLONOPIN) 0.5 MG tablet, Take 0.5 mg by mouth 2 (two) times daily as needed., Disp: , Rfl:  .  dicyclomine (BENTYL) 10 MG capsule, Take 2 capsules (20 mg total) by mouth 2 (two) times daily as needed for spasms., Disp: 120 capsule, Rfl: 4 .  diphenhydrAMINE (BENADRYL) 50 MG tablet, Take 1 tablet (50 mg total) by mouth once for 1 dose. Take 1 hour before your procedure time, Disp: 30 tablet, Rfl: 0 .  diphenoxylate-atropine (LOMOTIL) 2.5-0.025 MG tablet, TAKE 1 TABLET 3 TIMES DAILY ON SCHEDULE, Disp: 44 tablet, Rfl: 0 .  DULoxetine (CYMBALTA) 60 MG capsule, TAKE 1 CAPSULE BY MOUTH ONCE DAILY., Disp: 30 capsule, Rfl: 0 .  hydrochlorothiazide (HYDRODIURIL) 25 MG tablet, Take 1 tablet (25 mg total) by mouth every other day., Disp: 45 tablet, Rfl: 1 .  HYDROcodone-acetaminophen (NORCO/VICODIN) 5-325 MG tablet, Take 1 tablet by mouth every 6 (six) hours as needed for moderate pain., Disp: 30 tablet, Rfl: 0 .  levothyroxine (SYNTHROID) 100 MCG tablet, Take 1 tablet (100 mcg total) by mouth daily before breakfast., Disp: 30 tablet, Rfl: 6 .  losartan  (COZAAR) 100 MG tablet, Take 1 tablet (100 mg total) by mouth daily., Disp: 90 tablet, Rfl: 1 .  metoprolol tartrate (LOPRESSOR) 25 MG tablet, Take 0.5 tablets (12.5 mg total) by mouth at bedtime., Disp: 45 tablet, Rfl: 1 .  ondansetron (ZOFRAN ODT) 4 MG disintegrating tablet, Take 1 tablet (4 mg total) every 8 (eight) hours as needed by mouth for nausea or vomiting. 4mg  ODT q4 hours prn nausea/vomit, Disp: 30 tablet, Rfl: 0 .  pantoprazole (PROTONIX) 40  MG tablet, Take 1 tablet (40 mg total) by mouth daily before breakfast., Disp: 90 tablet, Rfl: 3 .  potassium chloride SA (K-DUR,KLOR-CON) 20 MEQ tablet, Take 20 mEq by mouth daily. , Disp: , Rfl:  .  predniSONE (DELTASONE) 5 MG tablet, Take 1 tablet (5 mg total) by mouth daily with breakfast. Take as directed. 6,5,4,3,2,1 po daily with food., Disp: 21 tablet, Rfl: 0 .  rosuvastatin (CRESTOR) 5 MG tablet, Take 5 mg by mouth daily. , Disp: , Rfl:   Allergies  Allergen Reactions  . Iodinated Diagnostic Agents Hives and Itching  . Isovue [Iopamidol] Itching    And hives   . Nitrofurantoin Hives  . Other Hives, Itching, Rash and Other (See Comments)    Use Paper tape ONLY  . Sulfa Antibiotics Hives  . Adhesive [Tape] Rash and Other (See Comments)    Use Paper tape ONLY  . Mango Flavor Swelling and Rash    Tongue swelling, feels like throat is closing in on her      ROS: Review of Systems Constitutional: negative Eyes: negative, sometimes feels that vision is blurry during severe pain episodes related to her back Ears, nose, mouth, throat, and face: Continues to have some sensation of something pressing on her throat, particularly when her neck is totally erect. Respiratory: negative Cardiovascular: negative Gastrointestinal: Positive for constipation and diarrhea.  Patient has an IBS diagnosis and is being followed by gastroenterology for this.  No hematochezia. Genitourinary:Urinary incontinence as above Integument/breast:  negative Hematologic/lymphatic: negative Musculoskeletal:positive for back pain and neck pain Neurological: She has intermittent numbness and tingling in the right upper extremity. Behavioral/Psych: She does report some depressed mood, particularly when her pain is flaring up. Endocrine: negative Allergic/Immunologic: negative    Physical exam BP 137/82   Pulse 67   Temp (!) 97 F (36.1 C) (Oral)   Ht 5\' 3"  (1.6 m)   Wt 207 lb (93.9 kg)   BMI 36.67 kg/m  General appearance: alert, cooperative, appears stated age, no distress and mildly obese Head: Normocephalic, without obvious abnormality, atraumatic Eyes: negative findings: lids and lashes normal, conjunctivae and sclerae normal, corneas clear and pupils equal, round, reactive to light and accomodation Ears: normal TM's and external ear canals both ears Nose: Nares normal. Septum midline. Mucosa normal. No drainage or sinus tenderness. Throat: lips, mucosa, and tongue normal; teeth and gums normal Neck: no adenopathy, no carotid bruit, no JVD, supple, symmetrical, trachea midline and Thyroid surgically absent.  She has a well-healed horizontal scar at the base of her neck anteriorly. Back: Active range of motion of C-spine is somewhat limited secondary to discomfort. Lungs: clear to auscultation bilaterally Heart: regular rate and rhythm, S1, S2 normal, no murmur, click, rub or gallop Abdomen: soft, non-tender; bowel sounds normal; no masses,  no organomegaly Pelvic: rectovaginal septum normal, vagina normal without discharge and Cervix surgically absent.  She has a very mild bladder prolapse.  No palpable abnormalities within the vaginal vault.  External vaginal tissue atrophic. Extremities: extremities normal, atraumatic, no cyanosis or edema Pulses: 2+ and symmetric Skin: No appreciable lesions or rashes.  Skin is intact. Lymph nodes: Cervical, supraclavicular, and axillary nodes normal. Neurologic: Grossly normal; patellar DTRs  are 1/4 bilaterally.  Current nerves II through XII grossly intact.  No focal neurologic deficits. Psych: Mood stable, speech normal, affect appropriate, pleasant and interactive. Depression screen [facility] 2/9 09/16/2018 06/06/2018 05/07/2018  Decreased Interest 1 1 1   Down, Depressed, Hopeless 1 1 1   PHQ - 2  Score 2 2 2   Altered sleeping 3 2 3   Tired, decreased energy 2 3 3   Change in appetite 2 0 0  Feeling bad or failure about yourself  0 0 0  Trouble concentrating 1 2 2   Moving slowly or fidgety/restless 0 0 0  Suicidal thoughts 0 0 0  PHQ-9 Score 10 9 10   Difficult doing work/chores Not difficult at all Not difficult at all -  Some recent data might be hidden   The 10-year ASCVD risk score Mikey Bussing DC Brooke Bonito., et al., 2013) is: 4.9%   Values used to calculate the score:     Age: 42 years     Sex: Female     Is Non-Hispanic African American: No     Diabetic: No     Tobacco smoker: No     Systolic Blood Pressure: 536 mmHg     Is BP treated: Yes     HDL Cholesterol: 58 mg/dL     Total Cholesterol: 174 mg/dL  Assessment/ Plan: Kelli Hope here for annual physical exam.   1. Annual physical exam Preventive measures are up-to-date.  Tetanus shot was administered today.  2. Screening for cervical cancer We collected Pap smear today however, cervix was noted be surgically absent.  We discussed that if no cervical cells were noted on pathology that screen for cervical cancer would not be needed going forward.  She was good understanding. - Pap IG and HPV (high risk) DNA detection  3. Need for Tdap vaccination Administered during today's visit.  4. Stress incontinence in female We discussed [place] exercises.  If symptoms persist despite home physical therapy, could consider adding Myrbetriq.  Additionally, patient did voice concern over multiple medications that she has listed.  We discussed that we certainly could trial off of some of her medications when she becomes more stable.  At  this time, she will continue medications prescribed for chronic pain and mood disorder.  Reviewed her lab results here in office.  She is to continue her medications.  Refills have been sent to her pharmacy.  Meds ordered this encounter  Medications  . DULoxetine (CYMBALTA) 60 MG capsule    Sig: Take 1 capsule (60 mg total) by mouth daily.    Dispense:  90 capsule    Refill:  3  . losartan (COZAAR) 100 MG tablet    Sig: Take 1 tablet (100 mg total) by mouth daily.    Dispense:  90 tablet    Refill:  3  . rosuvastatin (CRESTOR) 5 MG tablet    Sig: Take 1 tablet (5 mg total) by mouth daily.    Dispense:  90 tablet    Refill:  3  . potassium chloride SA (K-DUR,KLOR-CON) 20 MEQ tablet    Sig: Take 1 tablet (20 mEq total) by mouth daily.    Dispense:  90 tablet    Refill:  3    Handout provided on healthy lifestyle choices, including diet (rich in fruits, vegetables and lean meats and low in salt and simple carbohydrates) and exercise (at least 30 minutes of moderate physical activity daily).  Patient to follow up in 1 year for annual exam or sooner if needed.  Ashly M. Lajuana Ripple, DO

## 2018-09-17 ENCOUNTER — Ambulatory Visit (HOSPITAL_COMMUNITY)
Admission: RE | Admit: 2018-09-17 | Discharge: 2018-09-17 | Disposition: A | Discharge status: Home / Self Care | Payer: 59 | Source: Ambulatory Visit | Attending: Orthopaedic Surgery | Admitting: Orthopaedic Surgery

## 2018-09-17 ENCOUNTER — Encounter (INDEPENDENT_AMBULATORY_CARE_PROVIDER_SITE_OTHER): Payer: Self-pay

## 2018-09-17 ENCOUNTER — Ambulatory Visit (INDEPENDENT_AMBULATORY_CARE_PROVIDER_SITE_OTHER): Payer: 59 | Admitting: Orthopaedic Surgery

## 2018-09-17 DIAGNOSIS — M4722 Other spondylosis with radiculopathy, cervical region: Secondary | ICD-10-CM | POA: Diagnosis present

## 2018-09-18 ENCOUNTER — Ambulatory Visit (INDEPENDENT_AMBULATORY_CARE_PROVIDER_SITE_OTHER): Payer: 59 | Admitting: Orthopaedic Surgery

## 2018-09-18 ENCOUNTER — Encounter (INDEPENDENT_AMBULATORY_CARE_PROVIDER_SITE_OTHER): Payer: Self-pay | Admitting: Orthopaedic Surgery

## 2018-09-18 VITALS — BP 148/89 | HR 65 | Ht 63.0 in | Wt 207.0 lb

## 2018-09-18 DIAGNOSIS — M5137 Other intervertebral disc degeneration, lumbosacral region: Secondary | ICD-10-CM

## 2018-09-18 DIAGNOSIS — M5136 Other intervertebral disc degeneration, lumbar region: Secondary | ICD-10-CM

## 2018-09-18 DIAGNOSIS — M4722 Other spondylosis with radiculopathy, cervical region: Secondary | ICD-10-CM | POA: Diagnosis not present

## 2018-09-18 NOTE — Progress Notes (Signed)
Office Visit Note   Patient: Rebecca Scott           Date of Birth: December 29, 1955           MRN: [id number] Visit Date: 09/18/2018              Requested by: Janora Norlander, DO [place], [place] [address] PCP: Janora Norlander, DO   Assessment & Plan: Visit Diagnoses:  1. Other spondylosis with radiculopathy, cervical region   2. Degenerative disc disease at L5-S1 level     Plan: We reviewed the cervical MRI scan.  She has a copy of the report.  We reviewed her lumbar MRI scan which showed some disc degeneration and small disc protrusions at L4-5 and L5-S1.  We discussed working on walking program riding exercise bike weight loss, core strengthening.  Last year she went through almost 6 months of therapy and can do her exercises on her own.  Neck is doing significantly better and she can return if she has increasing symptoms.  Follow-Up Instructions: Return if symptoms worsen or fail to improve.   Orders:  No orders of the defined types were placed in this encounter.  No orders of the defined types were placed in this encounter.     Procedures: No procedures performed   Clinical Data: No additional findings.   Subjective: Chief Complaint  Patient presents with  . Neck - Pain, Follow-up    MRI Review Cervical Spine  . Right Arm - Pain, Follow-up    HPI 63 year old female returns with persistent problems with her neck with pain into her right shoulder.  Severe pain that had her in tears 09/08/2018 as improved.  She states she is currently having more problems with her lumbar spine with back and buttocks pain than her neck.  New MRI scan is available for review of cervical spine.  Review of Systems 14 point update unchanged from 09/08/2018 office visit other than as mentioned above.   Objective: Vital Signs: BP (!) 148/89   Pulse 65   Ht 5\' 3"  (1.6 m)   Wt 207 lb (93.9 kg)   BMI 36.67 kg/m   Physical Exam Constitutional:      Appearance: She  is well-developed.  HENT:     Head: Normocephalic.     Right Ear: External ear normal.     Left Ear: External ear normal.  Eyes:     Pupils: Pupils are equal, round, and reactive to light.  Neck:     Thyroid: No thyromegaly.     Trachea: No tracheal deviation.  Cardiovascular:     Rate and Rhythm: Normal rate.  Pulmonary:     Effort: Pulmonary effort is normal.  Abdominal:     Palpations: Abdomen is soft.  Skin:    General: Skin is warm and dry.  Neurological:     Mental Status: She is alert and oriented to person, place, and time.  Psychiatric:        Behavior: Behavior normal.     Ortho Exam patient has mild to moderate brachial plexus tenderness on the right still positive Spurling on the right.  Improved cervical range of motion versus 09/08/2018.  She has some sciatic notch tenderness some pain with straight leg raising.  No lower extremity clonus.  Anterior tib gastrocsoleus is intact.  Specialty Comments:  No specialty comments available.  Imaging: Mr Cervical Spine W/o Contrast  Result Date: 09/18/2018 CLINICAL DATA:  Spondylosis with right radiculopathy.  EXAM: MRI CERVICAL SPINE WITHOUT CONTRAST TECHNIQUE: Multiplanar, multisequence MR imaging of the cervical spine was performed. No intravenous contrast was administered. COMPARISON:  None. FINDINGS: Alignment: Unremarkable Vertebrae: No fracture, evidence of discitis, or bone lesion. Cord: Normal signal and morphology. Posterior Fossa, vertebral arteries, paraspinal tissues: Thyroidectomy changes Disc levels: C2-3: Unremarkable. C3-4: Unremarkable. C4-5: Mild disc narrowing and left eccentric protrusion with buttressing osteophyte. No cord deformity. C5-6: Mild disc narrowing with right paracentral protrusion and buttressing osteophyte. No cord deformity. C6-7: Tiny right eccentric protrusion. The canal and foramina remain widely patent. C7-T1:Unremarkable. IMPRESSION: Mild disc degeneration at C4-5 to C6-7 with diffusely  patent canal and foramina. Electronically Signed   By: Monte Fantasia M.D.   On: 09/18/2018 04:38     PMFS History: Patient Active Problem List   Diagnosis Date Noted  . Stress incontinence in female 09/16/2018  . Degenerative disc disease at L5-S1 level 06/06/2018  . Malignant neoplasm of thyroid gland ([place]) 05/27/2018  . Postsurgical hypothyroidism 05/27/2018  . Hypokalemia 04/20/2018  . Diarrhea 04/20/2018  . Hx of papillary thyroid carcinoma 03/26/2018  . S/P total thyroidectomy 03/10/2018  . Obstructive sleep apnea 03/05/2018  . Essential hypertension, benign 02/18/2018  . Aortic atherosclerosis ([place]) 01/24/2018  . History of renal cell carcinoma 12/03/2017  . Reactive depression 10/22/2017  . Prediabetes 08/21/2017  . Gastroesophageal reflux disease without esophagitis 07/05/2017  . Hyperlipidemia 07/29/2014  . Syncope 07/29/2014  . Mixed stress and urge urinary incontinence 01/14/2014  . Recurrent UTI 01/14/2014  . Vaginal atrophy 01/14/2014  . Benign lesion of eyelid 02/07/2012   Past Medical History:  Diagnosis Date  . Anemia   . Arthritis   . Asthma    seasonal asthma rarely   . Chronic kidney disease    renal mass right kidney   . CPAP (continuous positive airway pressure) dependence   . Diverticulitis   . GERD (gastroesophageal reflux disease)   . Hemorrhoids   . History of kidney stones   . HTN (hypertension) 07/29/2014  . Hyperlipemia   . Hypertension   . Migraine   . Multinodular goiter 08/21/2017   Last Assessment & Plan:  She will need an f/u u/s of thyroid to reassess nodules. Repeat in 07/2018 Nodules are too small for biopsy now.  . Papillary renal cell carcinoma ([place]) 05/2017   s/p partial nephrectomy, right  . Papillary thyroid carcinoma ([place]) 03/10/2018  . PONV (postoperative nausea and vomiting)    patient reports being "slower to wake than average "   . Pre-diabetes    "ive been told i'm borderline"   . Sleep apnea    CPAP use       Family History  Problem Relation Age of Onset  . COPD Mother   . Diabetes Mother   . Hypertension Mother   . High Cholesterol Mother   . Liver cancer Mother   . COPD Sister   . Hypertension Brother   . High Cholesterol Brother   . Hypertension Brother   . Asthma Son     Past Surgical History:  Procedure Laterality Date  . ABDOMINAL HYSTERECTOMY    . APPENDECTOMY  1978  . BIOPSY  07/08/2017   Procedure: BIOPSY;  Surgeon: Rogene Houston, MD;  [facility];  Service: Endoscopy;;  gastric  . CHOLECYSTECTOMY  2005  . COLONOSCOPY WITH PROPOFOL N/A 10/28/2017   Procedure: COLONOSCOPY WITH PROPOFOL;  Surgeon: Rogene Houston, MD;  [facility];  Service: Endoscopy;  Laterality: N/A;  7:30  . ESOPHAGOGASTRODUODENOSCOPY N/A 07/08/2017   Procedure: ESOPHAGOGASTRODUODENOSCOPY (EGD);  Surgeon: Rogene Houston, MD;  [facility];  Service: Endoscopy;  Laterality: N/A;  220  . HIP ARTHROPLASTY    . NASAL SEPTUM SURGERY    . ROBOTIC ASSITED PARTIAL NEPHRECTOMY Right 05/17/2017   Procedure: XI ROBOTIC ASSITED PARTIAL NEPHRECTOMY;  Surgeon: Alexis Frock, MD;  [facility];  Service: Urology;  Laterality: Right;  . THYROIDECTOMY N/A 03/10/2018   Procedure: TOTAL THYROIDECTOMY;  Surgeon: Aviva Signs, MD;  Location: [facility];  Service: General;  Laterality: N/A;   Social History   Occupational History  . Not on file  Tobacco Use  . Smoking status: Never Smoker  . Smokeless tobacco: Never Used  Substance and Sexual Activity  . Alcohol use: Yes    Comment: rarely   . Drug use: No  . Sexual activity: Not Currently

## 2018-09-19 ENCOUNTER — Telehealth (INDEPENDENT_AMBULATORY_CARE_PROVIDER_SITE_OTHER): Payer: Self-pay | Admitting: Orthopaedic Surgery

## 2018-09-19 LAB — PAP IG AND HPV HIGH-RISK

## 2018-09-19 LAB — HPV, LOW VOLUME (REFLEX): HPV low volume reflex: NEGATIVE

## 2018-09-19 MED ORDER — PREDNISONE 10 MG (21) PO TBPK: ORAL_TABLET | ORAL | 0 refills | Status: DC

## 2018-09-19 NOTE — Telephone Encounter (Signed)
Medication sent to pharmacy  

## 2018-09-19 NOTE — Telephone Encounter (Signed)
Patient called stated seen Rebecca Scott in [place] on yesterday. Stated she was told if she is not any better to call [name] here @ the [place] office.  She is wanting to see him today.  Please call [phone]

## 2018-09-19 NOTE — Telephone Encounter (Signed)
Dr. [name] called and spoke with patient. Per Dr. [name], send in Prednisone Dosepak 10mg  6,5,4,3,2,1 to [facility].

## 2018-09-20 ENCOUNTER — Other Ambulatory Visit: Payer: Self-pay

## 2018-09-20 ENCOUNTER — Emergency Department (HOSPITAL_COMMUNITY): Payer: 59

## 2018-09-20 ENCOUNTER — Encounter (HOSPITAL_COMMUNITY): Payer: Self-pay

## 2018-09-20 ENCOUNTER — Emergency Department (HOSPITAL_COMMUNITY)
Admission: EM | Admit: 2018-09-20 | Discharge: 2018-09-20 | Disposition: A | Discharge status: Home / Self Care | Payer: 59 | Attending: Emergency Medicine | Admitting: Emergency Medicine

## 2018-09-20 DIAGNOSIS — M549 Dorsalgia, unspecified: Secondary | ICD-10-CM

## 2018-09-20 DIAGNOSIS — M5417 Radiculopathy, lumbosacral region: Secondary | ICD-10-CM | POA: Insufficient documentation

## 2018-09-20 DIAGNOSIS — I10 Essential (primary) hypertension: Secondary | ICD-10-CM | POA: Insufficient documentation

## 2018-09-20 DIAGNOSIS — Z79899 Other long term (current) drug therapy: Secondary | ICD-10-CM | POA: Insufficient documentation

## 2018-09-20 DIAGNOSIS — Z85850 Personal history of malignant neoplasm of thyroid: Secondary | ICD-10-CM | POA: Diagnosis not present

## 2018-09-20 DIAGNOSIS — J45909 Unspecified asthma, uncomplicated: Secondary | ICD-10-CM | POA: Diagnosis not present

## 2018-09-20 DIAGNOSIS — M25551 Pain in right hip: Secondary | ICD-10-CM | POA: Diagnosis present

## 2018-09-20 LAB — I-STAT CHEM 8, ED
BUN: 12 mg/dL (ref 8–23)
CHLORIDE: 109 mmol/L (ref 98–111)
Calcium, Ion: 1.27 mmol/L (ref 1.15–1.40)
Creatinine, Ser: 0.6 mg/dL (ref 0.44–1.00)
Glucose, Bld: 155 mg/dL — ABNORMAL HIGH (ref 70–99)
HCT: 32 % — ABNORMAL LOW (ref 36.0–46.0)
Hemoglobin: 10.9 g/dL — ABNORMAL LOW (ref 12.0–15.0)
Potassium: 3.9 mmol/L (ref 3.5–5.1)
Sodium: 140 mmol/L (ref 135–145)
TCO2: 22 mmol/L (ref 22–32)

## 2018-09-20 LAB — CBG MONITORING, ED: Glucose-Capillary: 110 mg/dL — ABNORMAL HIGH (ref 70–99)

## 2018-09-20 MED ORDER — KETOROLAC TROMETHAMINE 30 MG/ML IJ SOLN
30.0000 mg | Freq: Once | INTRAMUSCULAR | Status: AC
  Administered 2018-09-20: 30 mg via INTRAVENOUS
  Filled 2018-09-20: qty 1

## 2018-09-20 MED ORDER — METHOCARBAMOL 500 MG PO TABS: 1000.0000 mg | ORAL_TABLET | Freq: Four times a day (QID) | ORAL | 0 refills | Status: DC

## 2018-09-20 MED ORDER — HYDROMORPHONE HCL 1 MG/ML IJ SOLN
1.0000 mg | Freq: Once | INTRAMUSCULAR | Status: AC
  Administered 2018-09-20: 1 mg via INTRAVENOUS
  Filled 2018-09-20: qty 1

## 2018-09-20 MED ORDER — SODIUM CHLORIDE 0.9 % IV BOLUS (SEPSIS)
1000.0000 mL | Freq: Once | INTRAVENOUS | Status: AC
  Administered 2018-09-20: 1000 mL via INTRAVENOUS

## 2018-09-20 MED ORDER — METOCLOPRAMIDE HCL 5 MG/ML IJ SOLN
10.0000 mg | Freq: Once | INTRAMUSCULAR | Status: AC
  Administered 2018-09-20: 10 mg via INTRAVENOUS
  Filled 2018-09-20: qty 2

## 2018-09-20 MED ORDER — ONDANSETRON HCL 4 MG/2ML IJ SOLN
4.0000 mg | Freq: Once | INTRAMUSCULAR | Status: AC
  Administered 2018-09-20: 4 mg via INTRAVENOUS
  Filled 2018-09-20: qty 2

## 2018-09-20 MED ORDER — HYDROMORPHONE HCL 1 MG/ML IJ SOLN
1.0000 mg | INTRAMUSCULAR | Status: DC | PRN
  Administered 2018-09-20: 1 mg via INTRAVENOUS
  Filled 2018-09-20: qty 1

## 2018-09-20 NOTE — ED Provider Notes (Signed)
9:38 AM patient seen at any pen this morning, transferred to the emergency department for lumbar spine MRI.  Patient with history of bulging disks, no previous surgeries.  Patient reports progressively worsening back pain with radiation into the right leg over the past several days.  Patient required several doses of IV pain medication and was given steroids in the emergency department.  States that she is currently feeling better than she was.  Awaiting MRI.  BP (!) 115/46 (BP Location: Right Arm)   Pulse (!) 58   Temp 97.8 F (36.6 C) (Oral)   Resp 16   Ht 5\' 3"  (1.6 m)   Wt 93.9 kg   SpO2 95%   BMI 36.67 kg/m   12:18 PM Patient and family at bedside updated on results.   I spoke with Dr. [name]. States patient will need an epidural. Recommends continued pain control, call office on Monday to set up an appt with Dr. Ernestina Patches. Hopefully steroids will kick in.   Will continue pain control measures.   2:15 PM patient's pain is controlled.  She did have a spell of nausea after second dose of pain medication.  Reglan given with improvement.  Patient's son and wife will stay with her tonight.  She also has plenty of support from family members.  Patient is comfortable with going home at this time.  Family is in support of this as well.  Encourage patient to take home pain medications every 4 hours, continue prednisone.  Will switch baclofen to Robaxin.  Patient will follow-up with her orthopedic office on Monday for consideration of injections.  Discussed that if her symptoms do become uncontrolled, she may return to the emergency department for further evaluation.  BP (!) 111/54 (BP Location: Right Arm)   Pulse (!) 52   Temp 97.8 F (36.6 C) (Oral)   Resp 18   Ht 5\' 3"  (1.6 m)   Wt 93.9 kg   SpO2 100%   BMI 36.67 kg/m     Carlisle Cater, PA-C 09/20/18 [place], [place], MD 09/30/18 [phone]

## 2018-09-20 NOTE — Discharge Instructions (Signed)
Please read and follow all provided instructions.  Your diagnoses today include:  1. Lumbosacral radiculopathy   2. Intractable back pain     Tests performed today include:  Vital signs - see below for your results today  MRI of the lumbar spine -shows significant disc bulging between the fourth and fifth lumbar vertebrae pushing on the right side nerve  Medications prescribed:   Robaxin (methocarbamol) - muscle relaxer medication  DO NOT drive or perform any activities that require you to be awake and alert because this medicine can make you drowsy.   Take any prescribed medications only as directed.  Home care instructions:   Follow any educational materials contained in this packet  Please rest, use ice or heat on your back for the next several days  Do not lift, push, pull anything more than 10 pounds for the next week  Take your home pain medication on a schedule over the next several days.   Use Colace to prevent constipation.  Follow-up instructions: Please follow-up with your orthopedic office in 2 days as discussed.  Return instructions:  SEEK IMMEDIATE MEDICAL ATTENTION IF YOU HAVE:  New numbness, tingling, weakness, or problem with the use of your arms or legs  Severe back pain not relieved with medications  Loss control of your bowels or bladder  Increasing pain in any areas of the body (such as chest or abdominal pain)  Shortness of breath, dizziness, or fainting.   Worsening nausea (feeling sick to your stomach), vomiting, fever, or sweats  Any other emergent concerns regarding your health   Additional Information:  Your vital signs today were: BP (!) 111/54 (BP Location: Right Arm)    Pulse (!) 52    Temp 97.8 F (36.6 C) (Oral)    Resp 18    Ht 5\' 3"  (1.6 m)    Wt 93.9 kg    SpO2 100%    BMI 36.67 kg/m  If your blood pressure (BP) was elevated above 135/85 this visit, please have this repeated by your doctor within one  month. --------------

## 2018-09-20 NOTE — ED Notes (Signed)
Patient transported to MRI 

## 2018-09-20 NOTE — ED Notes (Signed)
Pt ambulated approximately 40 feet with walker before she felt the need to head back to her room. Returned to room and pt states that she was nauseous and felt like she was going to pass out. Nurse Nira Conn in the room assisted me in getting patient to lie back down in the bed.

## 2018-09-20 NOTE — ED Notes (Signed)
Report given to Paul with [facility]  ?

## 2018-09-20 NOTE — ED Notes (Signed)
Patient verbalizes understanding of discharge instructions. Opportunity for questioning and answers were provided. 

## 2018-09-20 NOTE — ED Notes (Signed)
Pt ambulated with difficulty to restroom. Pt needed a walker to ambulate to restroom. When ambulating from restroom to bedside pt needed a wheelchair due to feeling lightheaded and nauseous. RN informed. Pt returned safely to bedside.

## 2018-09-20 NOTE — ED Notes (Signed)
CBG 110. 

## 2018-09-20 NOTE — ED Notes (Signed)
Pt ambulated approximately 10 ft. Pt was unable to stand up straight or to straighten the leg out without verbalizing discomfort.

## 2018-09-20 NOTE — ED Triage Notes (Signed)
Pt c/o right hip pain with radiation down to feet. Pt saw Dr [name] with [facility] on Thursday after having MRI here on Wednesday. Pt was started on prednisone, Norco, and baclofen today, pain has still increased despite medications, pt also reporting "pins and needles" to lower right leg and burning type pain.

## 2018-09-20 NOTE — ED Provider Notes (Signed)
[facility] EMERGENCY DEPARTMENT Provider Note   CSN: [id number] Arrival date & time: 09/20/18  0236     History   Chief Complaint Chief Complaint  Patient presents with  . Hip Pain    right side    HPI Rebecca Scott is a 63 y.o. female.  The history is provided by the patient and a friend (Patient gives permission to have friends present during history and physical).  Hip Pain  This is a recurrent problem. The problem occurs constantly. The problem has been rapidly worsening. Pertinent negatives include no chest pain and no abdominal pain. Exacerbated by: movement. The symptoms are relieved by rest and position. Treatments tried: Prednisone, pain medication. The treatment provided no relief.   Patient presents with right hip, right buttock pain that radiates into her leg.  She reports numbness in the right leg.  No recent falls.  It became acutely worse over the past day.  She was seen by her orthopedist recently and just started her on prednisone. No acute fecal incontinence.  Her symptoms are improved with certain positions No other acute complaints Past Medical History:  Diagnosis Date  . Anemia   . Arthritis   . Asthma    seasonal asthma rarely   . Chronic kidney disease    renal mass right kidney   . CPAP (continuous positive airway pressure) dependence   . Diverticulitis   . GERD (gastroesophageal reflux disease)   . Hemorrhoids   . History of kidney stones   . HTN (hypertension) 07/29/2014  . Hyperlipemia   . Hypertension   . Migraine   . Multinodular goiter 08/21/2017   Last Assessment & Plan:  She will need an f/u u/s of thyroid to reassess nodules. Repeat in 07/2018 Nodules are too small for biopsy now.  . Papillary renal cell carcinoma ([place]) 05/2017   s/p partial nephrectomy, right  . Papillary thyroid carcinoma ([place]) 03/10/2018  . PONV (postoperative nausea and vomiting)    patient reports being "slower to wake than average "   . Pre-diabetes    "ive  been told i'm borderline"   . Sleep apnea    CPAP use     Patient Active Problem List   Diagnosis Date Noted  . Stress incontinence in female 09/16/2018  . Degenerative disc disease at L5-S1 level 06/06/2018  . Malignant neoplasm of thyroid gland ([place]) 05/27/2018  . Postsurgical hypothyroidism 05/27/2018  . Hypokalemia 04/20/2018  . Diarrhea 04/20/2018  . Hx of papillary thyroid carcinoma 03/26/2018  . S/P total thyroidectomy 03/10/2018  . Obstructive sleep apnea 03/05/2018  . Essential hypertension, benign 02/18/2018  . Aortic atherosclerosis ([place]) 01/24/2018  . History of renal cell carcinoma 12/03/2017  . Reactive depression 10/22/2017  . Prediabetes 08/21/2017  . Gastroesophageal reflux disease without esophagitis 07/05/2017  . Hyperlipidemia 07/29/2014  . Syncope 07/29/2014  . Mixed stress and urge urinary incontinence 01/14/2014  . Recurrent UTI 01/14/2014  . Vaginal atrophy 01/14/2014  . Benign lesion of eyelid 02/07/2012    Past Surgical History:  Procedure Laterality Date  . ABDOMINAL HYSTERECTOMY    . APPENDECTOMY  1978  . BIOPSY  07/08/2017   Procedure: BIOPSY;  Surgeon: Rogene Houston, MD;  [facility];  Service: Endoscopy;;  gastric  . CHOLECYSTECTOMY  2005  . COLONOSCOPY WITH PROPOFOL N/A 10/28/2017   Procedure: COLONOSCOPY WITH PROPOFOL;  Surgeon: Rogene Houston, MD;  Location: [facility];  Service: Endoscopy;  Laterality: N/A;  7:30  . ESOPHAGOGASTRODUODENOSCOPY  N/A 07/08/2017   Procedure: ESOPHAGOGASTRODUODENOSCOPY (EGD);  Surgeon: Rogene Houston, MD;  Location: [facility];  Service: Endoscopy;  Laterality: N/A;  220  . HIP ARTHROPLASTY    . NASAL SEPTUM SURGERY    . ROBOTIC ASSITED PARTIAL NEPHRECTOMY Right 05/17/2017   Procedure: XI ROBOTIC ASSITED PARTIAL NEPHRECTOMY;  Surgeon: Alexis Frock, MD;  Location: [facility];  Service: Urology;  Laterality: Right;  . THYROIDECTOMY N/A 03/10/2018   Procedure: TOTAL THYROIDECTOMY;  Surgeon:  Aviva Signs, MD;  Location: [facility] ORS;  Service: General;  Laterality: N/A;     OB History    Gravida      Para      Term      Preterm      AB      Living  1     SAB      TAB      Ectopic      Multiple      Live Births               Home Medications    Prior to Admission medications   Medication Sig Start Date End Date Taking? Authorizing Provider  baclofen (LIORESAL) 10 MG tablet Take 10 mg by mouth daily.  04/10/18   [provider]  clonazePAM (KLONOPIN) 0.5 MG tablet Take 0.5 mg by mouth 2 (two) times daily as needed. 04/10/18   [provider]  dicyclomine (BENTYL) 10 MG capsule Take 2 capsules (20 mg total) by mouth 2 (two) times daily as needed for spasms. 07/22/18   Rogene Houston, MD  diphenhydrAMINE (BENADRYL) 50 MG tablet Take 1 tablet (50 mg total) by mouth once for 1 dose. Take 1 hour before your procedure time 06/13/18 07/04/19  Glennie Isle, NP-C  diphenoxylate-atropine (LOMOTIL) 2.5-0.025 MG tablet TAKE 1 TABLET 3 TIMES DAILY ON SCHEDULE 09/05/18   Setzer, Rona Ravens, NP  DULoxetine (CYMBALTA) 60 MG capsule Take 1 capsule (60 mg total) by mouth daily. 09/16/18   Janora Norlander, DO  hydrochlorothiazide (HYDRODIURIL) 25 MG tablet Take 1 tablet (25 mg total) by mouth every other day. 07/03/18   Arnoldo Lenis, MD  HYDROcodone-acetaminophen (NORCO/VICODIN) 5-325 MG tablet Take 1 tablet by mouth every 6 (six) hours as needed for moderate pain. 09/11/18   [name], MD  levothyroxine (SYNTHROID) 100 MCG tablet Take 1 tablet (100 mcg total) by mouth daily before breakfast. 07/29/18   Nida, Marella Chimes, MD  losartan (COZAAR) 100 MG tablet Take 1 tablet (100 mg total) by mouth daily. 09/16/18   Janora Norlander, DO  metoprolol tartrate (LOPRESSOR) 25 MG tablet Take 0.5 tablets (12.5 mg total) by mouth at bedtime. 07/03/18 10/01/18  Arnoldo Lenis, MD  ondansetron (ZOFRAN ODT) 4 MG disintegrating tablet Take 1 tablet (4 mg total)  every 8 (eight) hours as needed by mouth for nausea or vomiting. 4mg  ODT q4 hours prn nausea/vomit 07/30/17   Rehman, Mechele Dawley, MD  pantoprazole (PROTONIX) 40 MG tablet Take 1 tablet (40 mg total) by mouth daily before breakfast. 04/01/18   Rehman, Mechele Dawley, MD  potassium chloride SA (K-DUR,KLOR-CON) 20 MEQ tablet Take 1 tablet (20 mEq total) by mouth daily. 09/16/18   Janora Norlander, DO  predniSONE (DELTASONE) 5 MG tablet Take 1 tablet (5 mg total) by mouth daily with breakfast. Take as directed. 6,5,4,3,2,1 po daily with food. 09/08/18   [name], MD  predniSONE (STERAPRED UNI-PAK 21 TAB) 10 MG (21) TBPK tablet  Take as directed. 2,9,5,6,2,1 09/19/18   [name], MD  rosuvastatin (CRESTOR) 5 MG tablet Take 1 tablet (5 mg total) by mouth daily. 09/16/18   Janora Norlander, DO    Family History Family History  Problem Relation Age of Onset  . COPD Mother   . Diabetes Mother   . Hypertension Mother   . High Cholesterol Mother   . Liver cancer Mother   . COPD Sister   . Hypertension Brother   . High Cholesterol Brother   . Hypertension Brother   . Asthma Son     Social History Social History   Tobacco Use  . Smoking status: Never Smoker  . Smokeless tobacco: Never Used  Substance Use Topics  . Alcohol use: Yes    Comment: rarely   . Drug use: No     Allergies   Iodinated diagnostic agents; Isovue [iopamidol]; Nitrofurantoin; Other; Sulfa antibiotics; Adhesive [tape]; and Mango flavor   Review of Systems Review of Systems  Cardiovascular: Negative for chest pain.  Gastrointestinal: Negative for abdominal pain and vomiting.  Neurological: Positive for numbness. Negative for weakness.  All other systems reviewed and are negative.    Physical Exam Updated Vital Signs BP (!) 150/93   Pulse 84   Temp 97.8 F (36.6 C) (Oral)   Resp (!) 22   Ht 1.6 m (5\' 3" )   Wt 93.9 kg   SpO2 95%   BMI 36.67 kg/m   Physical Exam CONSTITUTIONAL: Well developed/well  nourished, uncomfortable appearing HEAD: Normocephalic/atraumatic EYES: EOMI/PERRL ENMT: Mucous membranes moist NECK: supple no meningeal signs SPINE/BACK:entire spine nontender, lumbar paraspinal tenderness CV: S1/S2 noted, no murmurs/rubs/gallops noted LUNGS: Lungs are clear to auscultation bilaterally, no apparent distress ABDOMEN: soft, nontender, no rebound or guarding, bowel sounds noted throughout abdomen GU:no cva tenderness NEURO: Pt is awake/alert/appropriate, moves all extremitiesx4.  No facial droop.   EXTREMITIES: pulses normal/equal, full ROM, patient keeping right leg flexed at the hip with knee flexion SKIN: warm, color normal PSYCH: Anxious  ED Treatments / Results  Labs (all labs ordered are listed, but only abnormal results are displayed) Labs Reviewed  I-STAT CHEM 8, ED    EKG None  Radiology No results found.  Procedures Procedures   Medications Ordered in ED Medications  sodium chloride 0.9 % bolus 1,000 mL (1,000 mLs Intravenous New Bag/Given 09/20/18 0630)  HYDROmorphone (DILAUDID) injection 1 mg (has no administration in time range)  HYDROmorphone (DILAUDID) injection 1 mg (1 mg Intravenous Given 09/20/18 0348)  ketorolac (TORADOL) 30 MG/ML injection 30 mg (30 mg Intravenous Given 09/20/18 0348)  HYDROmorphone (DILAUDID) injection 1 mg (1 mg Intravenous Given 09/20/18 0447)  HYDROmorphone (DILAUDID) injection 1 mg (1 mg Intravenous Given 09/20/18 0606)  ondansetron (ZOFRAN) injection 4 mg (4 mg Intravenous Given 09/20/18 0630)     Initial Impression / Assessment and Plan / ED Course  I have reviewed the triage vital signs and the nursing notes.      4:19 AM Patient presents with worsening right hip pain that rates into her feet.  This became abruptly worse over the past day.  She is well-known to orthopedics who has been managing this. Lumbar MRI from September 2019 reveals small disc herniations at L4-L5, L5-S1 Suspicious this is become acutely  worse. Initial neurologic exam limited as patient is lying on her left side keeping her right hip and knee flexed.  Initial plan will be to control pain and reassess 4:52 AM Patient appears improved.  She is  now lying supine with her leg propped up.  Great toe extension is intact.  She does have some mild weakness with right plantar flexion There is no sensory deficit in the right leg.  Distal pulses intact.  When lying down she can freely flex and extend hip and flex and extend right knee without difficulty. No clonus.  Reflexes are appropriate Still having pain, will treat pain and reassess 5:55 AM Patient feels that if she is able to walk a little further that she may improve.  She would like to try another attempt at walking. She has good follow-up already arranged.  She is to call her orthopedist in 2 days.  She has appropriate pain medications at home. 6:45 AM Patient had another attempt to ambulate however she became lightheaded and nauseous.  She is still having significant back pain. The pain begins in the low back goes to right buttock into the right leg. I have discussed the case with her orthopedic physician Dr. [name] with [facility].  He is on call all weekend She be transferred to [facility] ER to have an emergent lumbar MRI.  He will be available if necessary. Discussed with patient, she agrees with plan. I have discussed the case with the EDP Dr. Tyrone Nine at [facility], ER. Patient is otherwise awake and appropriate for transfer Final Clinical Impressions(s) / ED Diagnoses   Final diagnoses:  Lumbosacral radiculopathy  Intractable back pain    ED Discharge Orders    None       Ripley Fraise, MD 09/20/18 [id number]

## 2018-09-22 ENCOUNTER — Encounter (INDEPENDENT_AMBULATORY_CARE_PROVIDER_SITE_OTHER): Payer: Self-pay | Admitting: Family Medicine

## 2018-09-22 ENCOUNTER — Telehealth (INDEPENDENT_AMBULATORY_CARE_PROVIDER_SITE_OTHER): Payer: Self-pay | Admitting: Family Medicine

## 2018-09-22 ENCOUNTER — Ambulatory Visit (INDEPENDENT_AMBULATORY_CARE_PROVIDER_SITE_OTHER): Payer: 59 | Admitting: Family Medicine

## 2018-09-22 ENCOUNTER — Other Ambulatory Visit (INDEPENDENT_AMBULATORY_CARE_PROVIDER_SITE_OTHER): Payer: Self-pay | Admitting: Family Medicine

## 2018-09-22 ENCOUNTER — Telehealth (INDEPENDENT_AMBULATORY_CARE_PROVIDER_SITE_OTHER): Payer: Self-pay | Admitting: Physical Medicine and Rehabilitation

## 2018-09-22 DIAGNOSIS — M4722 Other spondylosis with radiculopathy, cervical region: Secondary | ICD-10-CM | POA: Diagnosis not present

## 2018-09-22 MED ORDER — BACLOFEN 10 MG PO TABS: 10.0000 mg | ORAL_TABLET | Freq: Three times a day (TID) | ORAL | 3 refills | Status: DC | PRN

## 2018-09-22 MED ORDER — HYDROCODONE-ACETAMINOPHEN 5-325 MG PO TABS: 1.0000 | ORAL_TABLET | Freq: Four times a day (QID) | ORAL | 0 refills | Status: DC | PRN

## 2018-09-22 MED ORDER — METHOCARBAMOL 750 MG PO TABS: 750.0000 mg | ORAL_TABLET | Freq: Four times a day (QID) | ORAL | 3 refills | Status: DC | PRN

## 2018-09-22 NOTE — Telephone Encounter (Signed)
Rx sent 

## 2018-09-22 NOTE — Addendum Note (Signed)
Addended by: Hortencia Pilar on: 09/22/2018 02:27 PM   Modules accepted: Orders

## 2018-09-22 NOTE — Telephone Encounter (Signed)
Please advise on this.  

## 2018-09-22 NOTE — Telephone Encounter (Signed)
Advised the patient

## 2018-09-22 NOTE — Telephone Encounter (Signed)
Patient called advised the Rx called in should have been doe Robaxin and not Baclofen. The number to contact patient is [phone]

## 2018-09-22 NOTE — Progress Notes (Signed)
Office Visit Note   Patient: Rebecca Scott           Date of Birth: 02-Jan-1956           MRN: [id number] Visit Date: 09/22/2018 Requested by: Janora Norlander, DO [place], [place] [address] PCP: Janora Norlander, DO  Subjective: Chief Complaint  Patient presents with  . Lower Back - Pain    Intermittent pain x years. This flareup started 09/17/18, with worsening pain on 09/19/18. Numbness anterior lower leg. H/o ESI with Dr.Newton 07/05/19.    HPI: She is a 63 year old with low back and right leg pain.  Symptoms started January 8, no injury.  Severe radicular pain.  She went to the ER on the 11th and had an MRI scan done showing L4-5 disc protrusion with severe foraminal stenosis and L5 nerve compression.  She was given pain medicine which has not helped, she is taking oral steroids as well and baclofen.  She is in tears because of pain, cannot find a comfortable position.              ROS: Noncontributory  Objective: Vital Signs: There were no vitals taken for this visit.  Physical Exam:  Low back: Straight leg raise is equivocal, isometric strength testing of the ankle is still 5/5 as well as EHL.  Hypoactive knee and ankle DTRs.  Imaging: Lumbar MRI scan shows L4-5 right-sided protrusion.  Assessment & Plan: 1.  Severe low back and right leg pain due to L4-5 disc protrusion -Epidural injection scheduled for next week.  Patient does not think she can wait that long.  We will check with outside radiology as well.  Recommended coming in tomorrow morning to meet with Dr. [name] to discuss surgical options.   Follow-Up Instructions: No follow-ups on file.      Procedures: No procedures performed  No notes on file    PMFS History: Patient Active Problem List   Diagnosis Date Noted  . Stress incontinence in female 09/16/2018  . Degenerative disc disease at L5-S1 level 06/06/2018  . Malignant neoplasm of thyroid gland ([place]) 05/27/2018  . Postsurgical  hypothyroidism 05/27/2018  . Hypokalemia 04/20/2018  . Diarrhea 04/20/2018  . Hx of papillary thyroid carcinoma 03/26/2018  . S/P total thyroidectomy 03/10/2018  . Obstructive sleep apnea 03/05/2018  . Essential hypertension, benign 02/18/2018  . Aortic atherosclerosis ([place]) 01/24/2018  . History of renal cell carcinoma 12/03/2017  . Reactive depression 10/22/2017  . Prediabetes 08/21/2017  . Gastroesophageal reflux disease without esophagitis 07/05/2017  . Hyperlipidemia 07/29/2014  . Syncope 07/29/2014  . Mixed stress and urge urinary incontinence 01/14/2014  . Recurrent UTI 01/14/2014  . Vaginal atrophy 01/14/2014  . Benign lesion of eyelid 02/07/2012   Past Medical History:  Diagnosis Date  . Anemia   . Arthritis   . Asthma    seasonal asthma rarely   . Chronic kidney disease    renal mass right kidney   . CPAP (continuous positive airway pressure) dependence   . Diverticulitis   . GERD (gastroesophageal reflux disease)   . Hemorrhoids   . History of kidney stones   . HTN (hypertension) 07/29/2014  . Hyperlipemia   . Hypertension   . Migraine   . Multinodular goiter 08/21/2017   Last Assessment & Plan:  She will need an f/u u/s of thyroid to reassess nodules. Repeat in 07/2018 Nodules are too small for biopsy now.  . Papillary renal cell carcinoma ([place]) 05/2017  s/p partial nephrectomy, right  . Papillary thyroid carcinoma ([place]) 03/10/2018  . PONV (postoperative nausea and vomiting)    patient reports being "slower to wake than average "   . Pre-diabetes    "ive been told i'm borderline"   . Sleep apnea    CPAP use     Family History  Problem Relation Age of Onset  . COPD Mother   . Diabetes Mother   . Hypertension Mother   . High Cholesterol Mother   . Liver cancer Mother   . COPD Sister   . Hypertension Brother   . High Cholesterol Brother   . Hypertension Brother   . Asthma Son     Past Surgical History:  Procedure Laterality Date  . ABDOMINAL  HYSTERECTOMY    . APPENDECTOMY  1978  . BIOPSY  07/08/2017   Procedure: BIOPSY;  Surgeon: Rogene Houston, MD;  [facility];  Service: Endoscopy;;  gastric  . CHOLECYSTECTOMY  2005  . COLONOSCOPY WITH PROPOFOL N/A 10/28/2017   Procedure: COLONOSCOPY WITH PROPOFOL;  Surgeon: Rogene Houston, MD;  [facility];  Service: Endoscopy;  Laterality: N/A;  7:30  . ESOPHAGOGASTRODUODENOSCOPY N/A 07/08/2017   Procedure: ESOPHAGOGASTRODUODENOSCOPY (EGD);  Surgeon: Rogene Houston, MD;  [facility];  Service: Endoscopy;  Laterality: N/A;  220  . HIP ARTHROPLASTY    . NASAL SEPTUM SURGERY    . ROBOTIC ASSITED PARTIAL NEPHRECTOMY Right 05/17/2017   Procedure: XI ROBOTIC ASSITED PARTIAL NEPHRECTOMY;  Surgeon: Alexis Frock, MD;  [facility];  Service: Urology;  Laterality: Right;  . THYROIDECTOMY N/A 03/10/2018   Procedure: TOTAL THYROIDECTOMY;  Surgeon: Aviva Signs, MD;  Location: [facility];  Service: General;  Laterality: N/A;   Social History   Occupational History  . Not on file  Tobacco Use  . Smoking status: Never Smoker  . Smokeless tobacco: Never Used  Substance and Sexual Activity  . Alcohol use: Yes    Comment: rarely   . Drug use: No  . Sexual activity: Not Currently

## 2018-09-23 ENCOUNTER — Ambulatory Visit (INDEPENDENT_AMBULATORY_CARE_PROVIDER_SITE_OTHER): Payer: 59 | Admitting: Orthopaedic Surgery

## 2018-09-23 ENCOUNTER — Encounter (INDEPENDENT_AMBULATORY_CARE_PROVIDER_SITE_OTHER): Payer: Self-pay | Admitting: Orthopaedic Surgery

## 2018-09-23 ENCOUNTER — Telehealth (INDEPENDENT_AMBULATORY_CARE_PROVIDER_SITE_OTHER): Payer: Self-pay | Admitting: *Deleted

## 2018-09-23 VITALS — BP 149/84 | HR 59 | Ht 63.0 in | Wt 207.0 lb

## 2018-09-23 DIAGNOSIS — M21371 Foot drop, right foot: Secondary | ICD-10-CM | POA: Diagnosis not present

## 2018-09-23 DIAGNOSIS — M5116 Intervertebral disc disorders with radiculopathy, lumbar region: Secondary | ICD-10-CM | POA: Diagnosis not present

## 2018-09-23 MED ORDER — OXYCODONE-ACETAMINOPHEN 5-325 MG PO TABS: 1.0000 | ORAL_TABLET | ORAL | 0 refills | Status: DC | PRN

## 2018-09-23 NOTE — Progress Notes (Signed)
Office Visit Note   Patient: Rebecca Scott           Date of Birth: 07-24-1956           MRN: [id number] Visit Date: 09/23/2018              Requested by: Janora Norlander, DO [place], [place] [address] PCP: Janora Norlander, DO   Assessment & Plan: Visit Diagnoses:  1. Lumbar disc herniation with radiculopathy   2. Right foot drop           Right L4-5 HNP  Plan: We will increase patient's pain medication to Percocet.  We will posterior for microdiscectomy right L4-5.  Wrist surgery discussed with patient and her son who is present today.  Risk of reherniation discussed and rate is 5 to 10%.  Potential for further degeneration and possibility of needing a fusion at some point in future.  Risks of dural tear discussed.  Overnight stay in the hospital.  She will be limited from bending lifting for 6 weeks.  Questions were elicited and answered she understands request we proceed.  Follow-Up Instructions: No follow-ups on file.   Orders:  No orders of the defined types were placed in this encounter.  No orders of the defined types were placed in this encounter.     Procedures: No procedures performed   Clinical Data: No additional findings.   Subjective: Chief Complaint  Patient presents with  . Lower Back - Pain    HPI 63 year old female sent to me by Dr. Junius Roads with excruciating severe back pain right leg pain and acute foot drop.  Onset of pain was last week.  She is not able to stand she cannot walk she is tried using a walker which is not been effective.  She took steroid pack, Robaxin muscle relaxant.  She has been on hydrocodone 1 every 3 hours without relief.  She got Dilaudid in the emergency room but it did not stop the pain.  MRI scan performed on 09/20/2018 showed L4-5 protrusion which is significantly compressed with extrusion compression of the right L5 nerve root in comparison to previous MRI on 06/03/2018.  Patient states her pain is 10 out of  10.  No bowel or bladder symptoms no cauda equina symptoms.  Review of Systems 14 point review of systems positive for IBS, GERD, thyroid cancer post thyroidectomy 03/10/2018 Dr. Arnoldo Morale.  Robotic assisted partial nephrectomy November 2018 Dr. Tresa Moore, positive for hypertension history of UTIs, prediabetes A1c less than 6.  Hyperlipidemia.  Cervical spondylosis stable with mild disc degeneration C4-C6 without compression and acute disc protrusion extrusion with radiculopathy L4-5 right.   Objective: Vital Signs: BP (!) 149/84   Pulse (!) 59   Ht 5\' 3"  (1.6 m)   Wt 207 lb (93.9 kg)   BMI 36.67 kg/m   Physical Exam Constitutional:      Appearance: She is well-developed.     Comments: Patient in severe pain unable to stand writhing with pain shifting in position in the wheelchair.  HENT:     Head: Normocephalic.     Right Ear: External ear normal.     Left Ear: External ear normal.  Eyes:     Pupils: Pupils are equal, round, and reactive to light.  Neck:     Thyroid: No thyromegaly.     Trachea: No tracheal deviation.  Cardiovascular:     Rate and Rhythm: Normal rate.  Pulmonary:  Effort: Pulmonary effort is normal.  Abdominal:     Palpations: Abdomen is soft.  Skin:    General: Skin is warm and dry.  Neurological:     Mental Status: She is alert and oriented to person, place, and time.  Psychiatric:        Behavior: Behavior normal.     Ortho Exam positive straight leg raising 40 degrees.  Right foot drop anterior tib EHL is out she does have gastrocsoleus function.  Contralateral straight leg raise negative to 90 degrees.  Anterior tib gastrocsoleus is intact on the left.  Specialty Comments:  No specialty comments available.  Imaging: CLINICAL DATA:  Low back pain.  Right lower extremity radiculopathy.  EXAM: MRI LUMBAR SPINE WITHOUT CONTRAST  TECHNIQUE: Multiplanar, multisequence MR imaging of the lumbar spine was performed. No intravenous contrast was  administered.  COMPARISON:  MRI of the lumbar spine 06/03/2018  FINDINGS: Segmentation: 5 non rib-bearing lumbar type vertebral bodies are present. The lowest fully formed vertebral body is L5.  Alignment:  AP alignment is anatomic  Vertebrae: Chronic endplate marrow changes are most evident at L4-5. Or mild changes are present at L5-S1. These are stable. A stable hemangioma is present at L1  Conus medullaris and cauda equina: Conus extends to the L1 level. Conus and cauda equina appear normal.  Paraspinal and other soft tissues: Subcentimeter cystic changes are present in the right kidney. Kidneys are otherwise within normal limits. Other solid organ lesions are present. There is no significant adenopathy.  Disc levels:  L1-2: Mild disc bulging is present without significant stenosis.  L2-3: Mild disc bulging is present without significant stenosis.  L3-4: Mild disc bulging is present. There is no significant stenosis.  L4-5: A progressive inferiorly directed right paramedian disc extrusion extends into the subarticular space creating severe stenosis and impacting the traversing right L5 nerve root.  L5-S1: A central disc protrusion is similar the prior study. No significant stenosis or change is present. Mild facet hypertrophy is noted bilaterally.  IMPRESSION: 1. Progressive right paramedian inferior disc extrusion at L4-5 with severe right subarticular narrowing likely impacting the traversing right L5 nerve roots. 2. Stable central disc protrusion without significant stenosis at L5-S1. 3. Mild disc bulging facet hypertrophy in the upper lumbar spine without other focal stenosis.   Electronically Signed   By: San Morelle M.D.   On: 09/20/2018 10:58    PMFS History: Patient Active Problem List   Diagnosis Date Noted  . Stress incontinence in female 09/16/2018  . Degenerative disc disease at L5-S1 level 06/06/2018  . Malignant  neoplasm of thyroid gland ([place]) 05/27/2018  . Postsurgical hypothyroidism 05/27/2018  . Hypokalemia 04/20/2018  . Diarrhea 04/20/2018  . Hx of papillary thyroid carcinoma 03/26/2018  . S/P total thyroidectomy 03/10/2018  . Obstructive sleep apnea 03/05/2018  . Essential hypertension, benign 02/18/2018  . Aortic atherosclerosis ([place]) 01/24/2018  . History of renal cell carcinoma 12/03/2017  . Reactive depression 10/22/2017  . Prediabetes 08/21/2017  . Gastroesophageal reflux disease without esophagitis 07/05/2017  . Hyperlipidemia 07/29/2014  . Syncope 07/29/2014  . Mixed stress and urge urinary incontinence 01/14/2014  . Recurrent UTI 01/14/2014  . Vaginal atrophy 01/14/2014  . Benign lesion of eyelid 02/07/2012   Past Medical History:  Diagnosis Date  . Anemia   . Arthritis   . Asthma    seasonal asthma rarely   . Chronic kidney disease    renal mass right kidney   . CPAP (continuous positive airway pressure)  dependence   . Diverticulitis   . GERD (gastroesophageal reflux disease)   . Hemorrhoids   . History of kidney stones   . HTN (hypertension) 07/29/2014  . Hyperlipemia   . Hypertension   . Migraine   . Multinodular goiter 08/21/2017   Last Assessment & Plan:  She will need an f/u u/s of thyroid to reassess nodules. Repeat in 07/2018 Nodules are too small for biopsy now.  . Papillary renal cell carcinoma ([place]) 05/2017   s/p partial nephrectomy, right  . Papillary thyroid carcinoma ([place]) 03/10/2018  . PONV (postoperative nausea and vomiting)    patient reports being "slower to wake than average "   . Pre-diabetes    "ive been told i'm borderline"   . Sleep apnea    CPAP use     Family History  Problem Relation Age of Onset  . COPD Mother   . Diabetes Mother   . Hypertension Mother   . High Cholesterol Mother   . Liver cancer Mother   . COPD Sister   . Hypertension Brother   . High Cholesterol Brother   . Hypertension Brother   . Asthma Son     Past  Surgical History:  Procedure Laterality Date  . ABDOMINAL HYSTERECTOMY    . APPENDECTOMY  1978  . BIOPSY  07/08/2017   Procedure: BIOPSY;  Surgeon: Rogene Houston, MD;  [facility];  Service: Endoscopy;;  gastric  . CHOLECYSTECTOMY  2005  . COLONOSCOPY WITH PROPOFOL N/A 10/28/2017   Procedure: COLONOSCOPY WITH PROPOFOL;  Surgeon: Rogene Houston, MD;  [facility];  Service: Endoscopy;  Laterality: N/A;  7:30  . ESOPHAGOGASTRODUODENOSCOPY N/A 07/08/2017   Procedure: ESOPHAGOGASTRODUODENOSCOPY (EGD);  Surgeon: Rogene Houston, MD;  [facility];  Service: Endoscopy;  Laterality: N/A;  220  . HIP ARTHROPLASTY    . NASAL SEPTUM SURGERY    . ROBOTIC ASSITED PARTIAL NEPHRECTOMY Right 05/17/2017   Procedure: XI ROBOTIC ASSITED PARTIAL NEPHRECTOMY;  Surgeon: Alexis Frock, MD;  Location: [facility];  Service: Urology;  Laterality: Right;  . THYROIDECTOMY N/A 03/10/2018   Procedure: TOTAL THYROIDECTOMY;  Surgeon: Aviva Signs, MD;  Location: [facility];  Service: General;  Laterality: N/A;   Social History   Occupational History  . Not on file  Tobacco Use  . Smoking status: Never Smoker  . Smokeless tobacco: Never Used  Substance and Sexual Activity  . Alcohol use: Yes    Comment: rarely   . Drug use: No  . Sexual activity: Not Currently

## 2018-09-23 NOTE — Addendum Note (Signed)
Addended by: [name] on: 09/23/2018 09:06 AM   Modules accepted: Orders

## 2018-09-23 NOTE — Telephone Encounter (Signed)
Notification or Prior Authorization is not required for the requested services [phone]  Decision ID #:[id number]

## 2018-09-24 ENCOUNTER — Inpatient Hospital Stay (HOSPITAL_COMMUNITY): Payer: 59 | Attending: Nurse Practitioner

## 2018-09-24 DIAGNOSIS — R109 Unspecified abdominal pain: Secondary | ICD-10-CM | POA: Diagnosis not present

## 2018-09-24 DIAGNOSIS — Z905 Acquired absence of kidney: Secondary | ICD-10-CM | POA: Insufficient documentation

## 2018-09-24 DIAGNOSIS — E890 Postprocedural hypothyroidism: Secondary | ICD-10-CM | POA: Insufficient documentation

## 2018-09-24 DIAGNOSIS — G894 Chronic pain syndrome: Secondary | ICD-10-CM | POA: Insufficient documentation

## 2018-09-24 DIAGNOSIS — C73 Malignant neoplasm of thyroid gland: Secondary | ICD-10-CM | POA: Insufficient documentation

## 2018-09-24 DIAGNOSIS — Z85528 Personal history of other malignant neoplasm of kidney: Secondary | ICD-10-CM

## 2018-09-24 LAB — CBC WITH DIFFERENTIAL/PLATELET
Abs Immature Granulocytes: 0.04 10*3/uL (ref 0.00–0.07)
Basophils Absolute: 0.1 10*3/uL (ref 0.0–0.1)
Basophils Relative: 1 %
Eosinophils Absolute: 0.2 10*3/uL (ref 0.0–0.5)
Eosinophils Relative: 2 %
HCT: 37.6 % (ref 36.0–46.0)
Hemoglobin: 11.6 g/dL — ABNORMAL LOW (ref 12.0–15.0)
Immature Granulocytes: 0 %
LYMPHS PCT: 26 %
Lymphs Abs: 2.3 10*3/uL (ref 0.7–4.0)
MCH: 28 pg (ref 26.0–34.0)
MCHC: 30.9 g/dL (ref 30.0–36.0)
MCV: 90.8 fL (ref 80.0–100.0)
Monocytes Absolute: 0.9 10*3/uL (ref 0.1–1.0)
Monocytes Relative: 10 %
NEUTROS ABS: 5.4 10*3/uL (ref 1.7–7.7)
Neutrophils Relative %: 61 %
Platelets: 153 10*3/uL (ref 150–400)
RBC: 4.14 MIL/uL (ref 3.87–5.11)
RDW: 14.6 % (ref 11.5–15.5)
WBC: 8.9 10*3/uL (ref 4.0–10.5)
nRBC: 0 % (ref 0.0–0.2)

## 2018-09-24 LAB — COMPREHENSIVE METABOLIC PANEL
ALT: 22 U/L (ref 0–44)
AST: 20 U/L (ref 15–41)
Albumin: 3.6 g/dL (ref 3.5–5.0)
Alkaline Phosphatase: 67 U/L (ref 38–126)
Anion gap: 8 (ref 5–15)
BUN: 11 mg/dL (ref 8–23)
CO2: 26 mmol/L (ref 22–32)
Calcium: 8.8 mg/dL — ABNORMAL LOW (ref 8.9–10.3)
Chloride: 102 mmol/L (ref 98–111)
Creatinine, Ser: 0.68 mg/dL (ref 0.44–1.00)
GFR calc non Af Amer: >60 mL/min (ref >60)
Glucose, Bld: 104 mg/dL — ABNORMAL HIGH (ref 70–99)
Potassium: 3.1 mmol/L — ABNORMAL LOW (ref 3.5–5.1)
Sodium: 136 mmol/L (ref 135–145)
Total Bilirubin: 0.5 mg/dL (ref 0.3–1.2)
Total Protein: 6.5 g/dL (ref 6.5–8.1)

## 2018-09-24 LAB — LACTATE DEHYDROGENASE: LDH: 176 U/L (ref 98–192)

## 2018-09-25 ENCOUNTER — Encounter (HOSPITAL_COMMUNITY): Payer: Self-pay | Admitting: *Deleted

## 2018-09-25 ENCOUNTER — Other Ambulatory Visit: Payer: Self-pay

## 2018-09-25 NOTE — Progress Notes (Signed)
Spoke with pt for pre-op call. Pt had some episodes of chest pain in August 2019 and had an Echo and EKG. No cardiac problems identified. She has followed up with Dr. Harl Bowie at least 3 times since for hypotension. Pt denies any recent chest pain or sob. Pt is pre-diabetic. Marland Kitchen Last A1C was 5.1 last week per pt. She states her fasting blood sugar is usually between 110-121. Instructed pt to check her blood sugar in the AM when she gets up and every 2 hours until she leaves for the hospital. If blood sugar is 70 or below, treat with 1/2 cup of clear juice (apple or cranberry) and recheck blood sugar 15 minutes after drinking juice. If blood sugar continues to be 70 or below, call the Short Stay department and ask to speak to a nurse. Pt voiced understanding.

## 2018-09-25 NOTE — Anesthesia Preprocedure Evaluation (Addendum)
Anesthesia Evaluation  Patient identified by MRN, date of birth, ID band Patient awake    Reviewed: Allergy & Precautions, NPO status , Patient's Chart, lab work & pertinent test results  History of Anesthesia Complications (+) PONV  Airway Mallampati: II  TM Distance: >3 FB Neck ROM: Full    Dental no notable dental hx.    Pulmonary asthma , sleep apnea and Continuous Positive Airway Pressure Ventilation ,    Pulmonary exam normal breath sounds clear to auscultation       Cardiovascular hypertension, Pt. on medications and Pt. on home beta blockers Normal cardiovascular exam Rhythm:Regular Rate:Normal  She had an echo 04/21/18 that showed VLEF 55% , grade I diastolic dysfunction, no significant valvular pathology.   7 day event monitor 05/01/2018 showed no arrhythmias, reported symptoms correlated with sinus rhythm.   Kerry Hough, MD is Cardiologist   Neuro/Psych  Headaches, PSYCHIATRIC DISORDERS Anxiety Depression    GI/Hepatic Neg liver ROS, GERD  Medicated and Controlled,  Endo/Other  Hypothyroidism   Renal/GU Renal disease     Musculoskeletal negative musculoskeletal ROS (+)   Abdominal (+) + obese,   Peds  Hematology  (+) anemia , HLD   Anesthesia Other Findings Right L4-5 herniated nucleus pulposus  Reproductive/Obstetrics                           Anesthesia Physical Anesthesia Plan  ASA: III  Anesthesia Plan: General   Post-op Pain Management:    Induction: Intravenous  PONV Risk Score and Plan: 4 or greater and Scopolamine patch - Pre-op, Midazolam, Dexamethasone, Ondansetron and Treatment may vary due to age or medical condition  Airway Management Planned: Oral ETT  Additional Equipment:   Intra-op Plan:   Post-operative Plan: Extubation in OR  Informed Consent: I have reviewed the patients History and Physical, chart, labs and discussed the procedure including  the risks, benefits and alternatives for the proposed anesthesia with the patient or authorized representative who has indicated his/her understanding and acceptance.     Dental advisory given  Plan Discussed with: CRNA  Anesthesia Plan Comments: (Reviewed PAT note by Karoline Caldwell, PA-C )      Anesthesia Quick Evaluation

## 2018-09-25 NOTE — Progress Notes (Signed)
Anesthesia Chart Review: SAME DAY WORKUP   Case:  [id number] Date/Time:  09/26/18 0944   Procedure:  L4-5 MICRODISCECTOMY (N/A )   Anesthesia type:  General   Pre-op diagnosis:  right L4-5 herniated nucleus pulposus   Location:  [facility] OR ROOM 06 / [facility]   Surgeon:  [name], MD      DISCUSSION: 63 yo female never smoker. Pertinent hx includes PONV, CKD (RCC s/p partial right nephrectomy 05/2017), HTN, Thyroid CA s/p thyroidectomy 03/2018, GERD, Anemia, OSA on CPAP.  Pt had recent eval by Dr. Harl Bowie for syncope and CP. Per Dr. Nelly Laurence notes the pt's onset of syncope coincided with traumatic events including death of her husband. She was positive for orthostatic hypotension and has had some difficulty controlling LE edema due to need to be judicious with diuretics to maintain BP. She had an echo 04/21/18 that showed VLEF 55% , grade I diastolic dysfunction, no significant valvular pathology. 7 day event monitor 05/01/2018 showed no arrhythmias, reported symptoms correlated with sinus rhythm.    Given recent reassuring echo and event monitor, anticipate she can proceed as planned barring acute status change.   VS: There were no vitals taken for this visit.  PROVIDERS: Janora Norlander, DO is PCP  Derek Jack, MD is Oncologist  Kerry Hough, MD is Cardiologist  LABS: Reviewed CBC and CMP from 09/24/18. Results notable only for mild hypokalemia K+ 3.1, mild anemia Hgb 11.6. Otherwise WNL.  IMAGES: MRI L spine 09/20/2018: IMPRESSION: 1. Progressive right paramedian inferior disc extrusion at L4-5 with severe right subarticular narrowing likely impacting the traversing right L5 nerve roots. 2. Stable central disc protrusion without significant stenosis at L5-S1. 3. Mild disc bulging facet hypertrophy in the upper lumbar spine without other focal stenosis.  EKG: 04/21/2018: Sinus bradycardia. Rate 50. Nonspecific ST and T wave abnormality  CV: Event monitor 05/01/2018:  7  day event monitor  Min HR 43, Max HR 118, Avg Hr 63. Min HR occurred early AM hours,presumably while sleeping  Reported symptoms correlated with sinus rhythm  No arrhythmias   TTE 04/21/2018: Study Conclusions  - Left ventricle: The cavity size was normal. Wall thickness was   increased in a pattern of mild LVH. Systolic function was normal.   The estimated ejection fraction was in the range of 55% to 60%.   Wall motion was normal; there were no regional wall motion   abnormalities. Doppler parameters are consistent with abnormal   left ventricular relaxation (grade 1 diastolic dysfunction).  Past Medical History:  Diagnosis Date  . Anemia   . Anxiety   . Arthritis   . Asthma    seasonal asthma rarely   . Chronic kidney disease    renal mass right kidney   . CPAP (continuous positive airway pressure) dependence   . Diverticulitis   . GERD (gastroesophageal reflux disease)   . Hemorrhoids   . History of kidney stones   . HTN (hypertension) 07/29/2014  . Hyperlipemia   . Hypertension   . Migraine    no migraines now, will have stress headaches  . Multinodular goiter 08/21/2017   Last Assessment & Plan:  She will need an f/u u/s of thyroid to reassess nodules. Repeat in 07/2018 Nodules are too small for biopsy now.  . Papillary renal cell carcinoma ([place]) 05/2017   s/p partial nephrectomy, right  . Papillary thyroid carcinoma ([place]) 03/10/2018  . PONV (postoperative nausea and vomiting)    patient reports being "slower to  wake than average "   . Pre-diabetes    "ive been told i'm borderline"   . Sleep apnea    CPAP use     Past Surgical History:  Procedure Laterality Date  . ABDOMINAL HYSTERECTOMY    . APPENDECTOMY  1978  . BIOPSY  07/08/2017   Procedure: BIOPSY;  Surgeon: Rogene Houston, MD;  [facility];  Service: Endoscopy;;  gastric  . CHOLECYSTECTOMY  2005  . COLONOSCOPY WITH PROPOFOL N/A 10/28/2017   Procedure: COLONOSCOPY WITH PROPOFOL;  Surgeon:  Rogene Houston, MD;  [facility];  Service: Endoscopy;  Laterality: N/A;  7:30  . ESOPHAGOGASTRODUODENOSCOPY N/A 07/08/2017   Procedure: ESOPHAGOGASTRODUODENOSCOPY (EGD);  Surgeon: Rogene Houston, MD;  [facility];  Service: Endoscopy;  Laterality: N/A;  220  . NASAL SEPTUM SURGERY    . ROBOTIC ASSITED PARTIAL NEPHRECTOMY Right 05/17/2017   Procedure: XI ROBOTIC ASSITED PARTIAL NEPHRECTOMY;  Surgeon: Alexis Frock, MD;  [facility];  Service: Urology;  Laterality: Right;  . THYROIDECTOMY N/A 03/10/2018   Procedure: TOTAL THYROIDECTOMY;  Surgeon: Aviva Signs, MD;  Location: [facility];  Service: General;  Laterality: N/A;    MEDICATIONS: No current facility-administered medications for this encounter.    . clonazePAM (KLONOPIN) 0.5 MG tablet  . dicyclomine (BENTYL) 10 MG capsule  . DULoxetine (CYMBALTA) 60 MG capsule  . hydrochlorothiazide (HYDRODIURIL) 25 MG tablet  . HYDROcodone-acetaminophen (NORCO/VICODIN) 5-325 MG tablet  . levothyroxine (SYNTHROID) 100 MCG tablet  . lidocaine (LIDODERM) 5 %  . losartan (COZAAR) 100 MG tablet  . methocarbamol (ROBAXIN) 750 MG tablet  . metoprolol tartrate (LOPRESSOR) 25 MG tablet  . ondansetron (ZOFRAN ODT) 4 MG disintegrating tablet  . oxyCODONE-acetaminophen (PERCOCET/ROXICET) 5-325 MG tablet  . pantoprazole (PROTONIX) 40 MG tablet  . potassium chloride SA (K-DUR,KLOR-CON) 20 MEQ tablet  . rosuvastatin (CRESTOR) 5 MG tablet  . diphenhydrAMINE (BENADRYL) 50 MG tablet  . diphenoxylate-atropine (LOMOTIL) 2.5-0.025 MG tablet  . predniSONE (STERAPRED UNI-PAK 21 TAB) 10 MG (21) TBPK tablet      Wynonia Musty [facility]Anesthesiology Phone [phone] 09/25/2018 1:39 PM

## 2018-09-26 ENCOUNTER — Ambulatory Visit (HOSPITAL_COMMUNITY): Payer: 59 | Admitting: Physician Assistant

## 2018-09-26 ENCOUNTER — Encounter (HOSPITAL_COMMUNITY)
Admission: RE | Disposition: A | Discharge status: Home / Self Care | Payer: Self-pay | Source: Home / Self Care | Attending: Orthopaedic Surgery

## 2018-09-26 ENCOUNTER — Other Ambulatory Visit (HOSPITAL_COMMUNITY): Payer: 59

## 2018-09-26 ENCOUNTER — Encounter (HOSPITAL_COMMUNITY): Payer: Self-pay | Admitting: Anesthesiology

## 2018-09-26 ENCOUNTER — Ambulatory Visit (HOSPITAL_COMMUNITY)
Admission: RE | Admit: 2018-09-26 | Discharge: 2018-09-27 | Disposition: A | Discharge status: Home / Self Care | Payer: 59 | Attending: Orthopaedic Surgery | Admitting: Orthopaedic Surgery

## 2018-09-26 DIAGNOSIS — N189 Chronic kidney disease, unspecified: Secondary | ICD-10-CM | POA: Diagnosis not present

## 2018-09-26 DIAGNOSIS — Z79890 Hormone replacement therapy (postmenopausal): Secondary | ICD-10-CM | POA: Diagnosis not present

## 2018-09-26 DIAGNOSIS — Z419 Encounter for procedure for purposes other than remedying health state, unspecified: Secondary | ICD-10-CM

## 2018-09-26 DIAGNOSIS — Z85528 Personal history of other malignant neoplasm of kidney: Secondary | ICD-10-CM | POA: Diagnosis not present

## 2018-09-26 DIAGNOSIS — Z9989 Dependence on other enabling machines and devices: Secondary | ICD-10-CM | POA: Diagnosis not present

## 2018-09-26 DIAGNOSIS — M5116 Intervertebral disc disorders with radiculopathy, lumbar region: Secondary | ICD-10-CM | POA: Diagnosis not present

## 2018-09-26 DIAGNOSIS — M21371 Foot drop, right foot: Secondary | ICD-10-CM | POA: Diagnosis not present

## 2018-09-26 DIAGNOSIS — Z79899 Other long term (current) drug therapy: Secondary | ICD-10-CM | POA: Diagnosis not present

## 2018-09-26 DIAGNOSIS — G4733 Obstructive sleep apnea (adult) (pediatric): Secondary | ICD-10-CM | POA: Diagnosis not present

## 2018-09-26 DIAGNOSIS — I129 Hypertensive chronic kidney disease with stage 1 through stage 4 chronic kidney disease, or unspecified chronic kidney disease: Secondary | ICD-10-CM | POA: Diagnosis not present

## 2018-09-26 DIAGNOSIS — E785 Hyperlipidemia, unspecified: Secondary | ICD-10-CM | POA: Insufficient documentation

## 2018-09-26 DIAGNOSIS — Z79891 Long term (current) use of opiate analgesic: Secondary | ICD-10-CM | POA: Diagnosis not present

## 2018-09-26 DIAGNOSIS — K219 Gastro-esophageal reflux disease without esophagitis: Secondary | ICD-10-CM | POA: Diagnosis not present

## 2018-09-26 DIAGNOSIS — F329 Major depressive disorder, single episode, unspecified: Secondary | ICD-10-CM | POA: Diagnosis not present

## 2018-09-26 DIAGNOSIS — Z85850 Personal history of malignant neoplasm of thyroid: Secondary | ICD-10-CM | POA: Insufficient documentation

## 2018-09-26 DIAGNOSIS — Z905 Acquired absence of kidney: Secondary | ICD-10-CM | POA: Insufficient documentation

## 2018-09-26 DIAGNOSIS — M549 Dorsalgia, unspecified: Secondary | ICD-10-CM | POA: Diagnosis present

## 2018-09-26 DIAGNOSIS — F419 Anxiety disorder, unspecified: Secondary | ICD-10-CM | POA: Insufficient documentation

## 2018-09-26 DIAGNOSIS — E890 Postprocedural hypothyroidism: Secondary | ICD-10-CM | POA: Diagnosis not present

## 2018-09-26 DIAGNOSIS — M5126 Other intervertebral disc displacement, lumbar region: Secondary | ICD-10-CM

## 2018-09-26 HISTORY — PX: LUMBAR LAMINECTOMY/DECOMPRESSION MICRODISCECTOMY: SHX5026

## 2018-09-26 HISTORY — DX: Anxiety disorder, unspecified: F41.9

## 2018-09-26 LAB — URINALYSIS, ROUTINE W REFLEX MICROSCOPIC
Bacteria, UA: NONE SEEN
Glucose, UA: NEGATIVE mg/dL
Hgb urine dipstick: NEGATIVE
Ketones, ur: NEGATIVE mg/dL
Nitrite: NEGATIVE
PH: 6 (ref 5.0–8.0)
Protein, ur: NEGATIVE mg/dL
Specific Gravity, Urine: 1.021 (ref 1.005–1.030)

## 2018-09-26 LAB — GLUCOSE, CAPILLARY
Glucose-Capillary: 85 mg/dL (ref 70–99)
Glucose-Capillary: 71 mg/dL (ref 70–99)

## 2018-09-26 LAB — SURGICAL PCR SCREEN
MRSA, PCR: NEGATIVE
Staphylococcus aureus: NEGATIVE

## 2018-09-26 SURGERY — LUMBAR LAMINECTOMY/DECOMPRESSION MICRODISCECTOMY
Anesthesia: General | Site: Spine Lumbar

## 2018-09-26 MED ORDER — ONDANSETRON HCL 4 MG/2ML IJ SOLN
INTRAMUSCULAR | Status: DC | PRN
  Administered 2018-09-26: 4 mg via INTRAVENOUS

## 2018-09-26 MED ORDER — ACETAMINOPHEN 650 MG RE SUPP: 650.0000 mg | RECTAL | Status: DC | PRN

## 2018-09-26 MED ORDER — SUGAMMADEX SODIUM 200 MG/2ML IV SOLN
INTRAVENOUS | Status: DC | PRN
  Administered 2018-09-26: 200 mg via INTRAVENOUS

## 2018-09-26 MED ORDER — OXYCODONE HCL 5 MG PO TABS
ORAL_TABLET | ORAL | Status: AC
  Filled 2018-09-26: qty 1

## 2018-09-26 MED ORDER — OXYCODONE HCL 5 MG PO TABS
5.0000 mg | ORAL_TABLET | ORAL | Status: DC | PRN
  Administered 2018-09-26 – 2018-09-27 (×4): 5 mg via ORAL
  Filled 2018-09-26 (×4): qty 1

## 2018-09-26 MED ORDER — CHLORHEXIDINE GLUCONATE 4 % EX LIQD: 60.0000 mL | Freq: Once | CUTANEOUS | Status: DC

## 2018-09-26 MED ORDER — HYDROMORPHONE HCL 1 MG/ML IJ SOLN: 0.5000 mg | INTRAMUSCULAR | Status: DC | PRN

## 2018-09-26 MED ORDER — SODIUM CHLORIDE 0.9 % IV SOLN
INTRAVENOUS | Status: DC | PRN
  Administered 2018-09-26: 25 ug/min via INTRAVENOUS

## 2018-09-26 MED ORDER — HYDROMORPHONE HCL 1 MG/ML IJ SOLN
INTRAMUSCULAR | Status: AC
  Filled 2018-09-26: qty 1

## 2018-09-26 MED ORDER — BUPIVACAINE HCL (PF) 0.25 % IJ SOLN
INTRAMUSCULAR | Status: AC
  Filled 2018-09-26: qty 30

## 2018-09-26 MED ORDER — CEFAZOLIN SODIUM-DEXTROSE 1-4 GM/50ML-% IV SOLN
1.0000 g | Freq: Three times a day (TID) | INTRAVENOUS | Status: AC
  Administered 2018-09-26 (×2): 1 g via INTRAVENOUS
  Filled 2018-09-26 (×2): qty 50

## 2018-09-26 MED ORDER — THROMBIN (RECOMBINANT) 20000 UNITS EX SOLR
CUTANEOUS | Status: AC
  Filled 2018-09-26: qty 20000

## 2018-09-26 MED ORDER — ACETAMINOPHEN 500 MG PO TABS
1000.0000 mg | ORAL_TABLET | Freq: Once | ORAL | Status: AC
  Administered 2018-09-26: 1000 mg via ORAL
  Filled 2018-09-26: qty 2

## 2018-09-26 MED ORDER — FENTANYL CITRATE (PF) 250 MCG/5ML IJ SOLN
INTRAMUSCULAR | Status: AC
  Filled 2018-09-26: qty 5

## 2018-09-26 MED ORDER — ONDANSETRON HCL 4 MG/2ML IJ SOLN: 4.0000 mg | Freq: Four times a day (QID) | INTRAMUSCULAR | Status: DC | PRN

## 2018-09-26 MED ORDER — PROPOFOL 10 MG/ML IV BOLUS
INTRAVENOUS | Status: DC | PRN
  Administered 2018-09-26: 150 mg via INTRAVENOUS

## 2018-09-26 MED ORDER — DEXAMETHASONE SODIUM PHOSPHATE 10 MG/ML IJ SOLN
INTRAMUSCULAR | Status: DC | PRN
  Administered 2018-09-26: 10 mg via INTRAVENOUS

## 2018-09-26 MED ORDER — METHOCARBAMOL 1000 MG/10ML IJ SOLN
500.0000 mg | Freq: Four times a day (QID) | INTRAVENOUS | Status: DC | PRN
  Filled 2018-09-26: qty 5

## 2018-09-26 MED ORDER — SCOPOLAMINE 1 MG/3DAYS TD PT72
MEDICATED_PATCH | TRANSDERMAL | Status: AC
  Filled 2018-09-26: qty 1

## 2018-09-26 MED ORDER — SODIUM CHLORIDE 0.9% FLUSH: 3.0000 mL | Freq: Two times a day (BID) | INTRAVENOUS | Status: DC

## 2018-09-26 MED ORDER — METHOCARBAMOL 500 MG PO TABS
500.0000 mg | ORAL_TABLET | Freq: Four times a day (QID) | ORAL | Status: DC | PRN
  Administered 2018-09-26 – 2018-09-27 (×4): 500 mg via ORAL
  Filled 2018-09-26 (×3): qty 1

## 2018-09-26 MED ORDER — OXYCODONE HCL 5 MG/5ML PO SOLN: 5.0000 mg | Freq: Once | ORAL | Status: AC | PRN

## 2018-09-26 MED ORDER — DOCUSATE SODIUM 100 MG PO CAPS
100.0000 mg | ORAL_CAPSULE | Freq: Two times a day (BID) | ORAL | Status: DC
  Administered 2018-09-26: 100 mg via ORAL
  Filled 2018-09-26: qty 1

## 2018-09-26 MED ORDER — POLYETHYLENE GLYCOL 3350 17 G PO PACK: 17.0000 g | PACK | Freq: Every day | ORAL | Status: DC | PRN

## 2018-09-26 MED ORDER — ACETAMINOPHEN 325 MG PO TABS: 650.0000 mg | ORAL_TABLET | ORAL | Status: DC | PRN

## 2018-09-26 MED ORDER — HYDROMORPHONE HCL 1 MG/ML IJ SOLN
0.2500 mg | INTRAMUSCULAR | Status: DC | PRN
  Administered 2018-09-26 (×2): 0.5 mg via INTRAVENOUS

## 2018-09-26 MED ORDER — ONDANSETRON HCL 4 MG PO TABS: 4.0000 mg | ORAL_TABLET | Freq: Four times a day (QID) | ORAL | Status: DC | PRN

## 2018-09-26 MED ORDER — METHOCARBAMOL 500 MG PO TABS: 500.0000 mg | ORAL_TABLET | Freq: Four times a day (QID) | ORAL | 0 refills | Status: DC | PRN

## 2018-09-26 MED ORDER — 0.9 % SODIUM CHLORIDE (POUR BTL) OPTIME
TOPICAL | Status: DC | PRN
  Administered 2018-09-26: 1000 mL

## 2018-09-26 MED ORDER — PROPOFOL 10 MG/ML IV BOLUS
INTRAVENOUS | Status: AC
  Filled 2018-09-26: qty 20

## 2018-09-26 MED ORDER — BUPIVACAINE HCL (PF) 0.25 % IJ SOLN
INTRAMUSCULAR | Status: DC | PRN
  Administered 2018-09-26: 10 mL

## 2018-09-26 MED ORDER — MENTHOL 3 MG MT LOZG: 1.0000 | LOZENGE | OROMUCOSAL | Status: DC | PRN

## 2018-09-26 MED ORDER — POTASSIUM CHLORIDE CRYS ER 20 MEQ PO TBCR: 20.0000 meq | EXTENDED_RELEASE_TABLET | Freq: Every day | ORAL | Status: DC

## 2018-09-26 MED ORDER — SODIUM CHLORIDE 0.9 % IV SOLN: INTRAVENOUS | Status: DC

## 2018-09-26 MED ORDER — EPHEDRINE SULFATE 50 MG/ML IJ SOLN
INTRAMUSCULAR | Status: DC | PRN
  Administered 2018-09-26: 5 mg via INTRAVENOUS

## 2018-09-26 MED ORDER — SCOPOLAMINE 1 MG/3DAYS TD PT72
MEDICATED_PATCH | TRANSDERMAL | Status: DC | PRN
  Administered 2018-09-26: 1 via TRANSDERMAL

## 2018-09-26 MED ORDER — ROSUVASTATIN CALCIUM 5 MG PO TABS: 5.0000 mg | ORAL_TABLET | Freq: Every day | ORAL | Status: DC

## 2018-09-26 MED ORDER — SODIUM CHLORIDE 0.9% FLUSH: 3.0000 mL | INTRAVENOUS | Status: DC | PRN

## 2018-09-26 MED ORDER — PHENOL 1.4 % MT LIQD: 1.0000 | OROMUCOSAL | Status: DC | PRN

## 2018-09-26 MED ORDER — METOPROLOL TARTRATE 12.5 MG HALF TABLET
12.5000 mg | ORAL_TABLET | Freq: Every day | ORAL | Status: DC
  Administered 2018-09-26: 12.5 mg via ORAL
  Filled 2018-09-26: qty 1

## 2018-09-26 MED ORDER — LOSARTAN POTASSIUM 50 MG PO TABS: 100.0000 mg | ORAL_TABLET | Freq: Every day | ORAL | Status: DC

## 2018-09-26 MED ORDER — METHOCARBAMOL 500 MG PO TABS
ORAL_TABLET | ORAL | Status: AC
  Filled 2018-09-26: qty 1

## 2018-09-26 MED ORDER — PANTOPRAZOLE SODIUM 40 MG PO TBEC
40.0000 mg | DELAYED_RELEASE_TABLET | Freq: Every day | ORAL | Status: DC
  Administered 2018-09-27: 40 mg via ORAL
  Filled 2018-09-26: qty 1

## 2018-09-26 MED ORDER — MIDAZOLAM HCL 5 MG/5ML IJ SOLN
INTRAMUSCULAR | Status: DC | PRN
  Administered 2018-09-26: 2 mg via INTRAVENOUS

## 2018-09-26 MED ORDER — LACTATED RINGERS IV SOLN
INTRAVENOUS | Status: DC | PRN
  Administered 2018-09-26 (×2): via INTRAVENOUS

## 2018-09-26 MED ORDER — ROCURONIUM BROMIDE 50 MG/5ML IV SOSY
PREFILLED_SYRINGE | INTRAVENOUS | Status: DC | PRN
  Administered 2018-09-26: 50 mg via INTRAVENOUS

## 2018-09-26 MED ORDER — OXYCODONE-ACETAMINOPHEN 5-325 MG PO TABS: 1.0000 | ORAL_TABLET | ORAL | 0 refills | Status: DC | PRN

## 2018-09-26 MED ORDER — MIDAZOLAM HCL 2 MG/2ML IJ SOLN
INTRAMUSCULAR | Status: AC
  Filled 2018-09-26: qty 2

## 2018-09-26 MED ORDER — OXYCODONE-ACETAMINOPHEN 5-325 MG PO TABS: 1.0000 | ORAL_TABLET | Freq: Four times a day (QID) | ORAL | 0 refills | Status: DC | PRN

## 2018-09-26 MED ORDER — OXYCODONE HCL 5 MG PO TABS
5.0000 mg | ORAL_TABLET | Freq: Once | ORAL | Status: AC | PRN
  Administered 2018-09-26: 5 mg via ORAL

## 2018-09-26 MED ORDER — PROMETHAZINE HCL 25 MG/ML IJ SOLN: 6.2500 mg | INTRAMUSCULAR | Status: DC | PRN

## 2018-09-26 MED ORDER — CEFAZOLIN SODIUM-DEXTROSE 2-4 GM/100ML-% IV SOLN
2.0000 g | INTRAVENOUS | Status: AC
  Administered 2018-09-26: 2 g via INTRAVENOUS
  Filled 2018-09-26: qty 100

## 2018-09-26 MED ORDER — PHENYLEPHRINE HCL 10 MG/ML IJ SOLN
INTRAMUSCULAR | Status: DC | PRN
  Administered 2018-09-26 (×2): 80 ug via INTRAVENOUS

## 2018-09-26 MED ORDER — LIDOCAINE 2% (20 MG/ML) 5 ML SYRINGE
INTRAMUSCULAR | Status: DC | PRN
  Administered 2018-09-26: 60 mg via INTRAVENOUS

## 2018-09-26 MED ORDER — FENTANYL CITRATE (PF) 100 MCG/2ML IJ SOLN
INTRAMUSCULAR | Status: DC | PRN
  Administered 2018-09-26: 100 ug via INTRAVENOUS
  Administered 2018-09-26: 25 ug via INTRAVENOUS
  Administered 2018-09-26: 50 ug via INTRAVENOUS

## 2018-09-26 MED ORDER — LEVOTHYROXINE SODIUM 100 MCG PO TABS
100.0000 ug | ORAL_TABLET | Freq: Every day | ORAL | Status: DC
  Administered 2018-09-27: 100 ug via ORAL
  Filled 2018-09-26: qty 1

## 2018-09-26 SURGICAL SUPPLY — 42 items
BUR ROUND FLUTED 4 SOFT TCH (BURR) ×1 IMPLANT
CANISTER SUCT 3000ML PPV (MISCELLANEOUS) ×2 IMPLANT
CLSR STERI-STRIP ANTIMIC 1/2X4 (GAUZE/BANDAGES/DRESSINGS) ×2 IMPLANT
COVER SURGICAL LIGHT HANDLE (MISCELLANEOUS) ×2 IMPLANT
COVER WAND RF STERILE (DRAPES) ×2 IMPLANT
DECANTER SPIKE VIAL GLASS SM (MISCELLANEOUS) ×2 IMPLANT
DRAPE HALF SHEET 40X57 (DRAPES) ×4 IMPLANT
DRAPE MICROSCOPE LEICA (MISCELLANEOUS) ×2 IMPLANT
DRAPE SURG 17X23 STRL (DRAPES) ×2 IMPLANT
DRSG MEPILEX BORDER 4X4 (GAUZE/BANDAGES/DRESSINGS) ×2 IMPLANT
DURAPREP 26ML APPLICATOR (WOUND CARE) ×2 IMPLANT
ELECT REM PT RETURN 9FT ADLT (ELECTROSURGICAL) ×2
ELECTRODE REM PT RTRN 9FT ADLT (ELECTROSURGICAL) ×1 IMPLANT
GLOVE BIOGEL PI IND STRL 8 (GLOVE) ×2 IMPLANT
GLOVE BIOGEL PI INDICATOR 8 (GLOVE) ×2
GLOVE ORTHO TXT STRL SZ7.5 (GLOVE) ×4 IMPLANT
GOWN STRL REUS W/ TWL LRG LVL3 (GOWN DISPOSABLE) ×2 IMPLANT
GOWN STRL REUS W/ TWL XL LVL3 (GOWN DISPOSABLE) ×1 IMPLANT
GOWN STRL REUS W/TWL 2XL LVL3 (GOWN DISPOSABLE) ×2 IMPLANT
GOWN STRL REUS W/TWL LRG LVL3 (GOWN DISPOSABLE) ×4
GOWN STRL REUS W/TWL XL LVL3 (GOWN DISPOSABLE) ×2
KIT BASIN OR (CUSTOM PROCEDURE TRAY) ×2 IMPLANT
KIT TURNOVER KIT B (KITS) ×2 IMPLANT
MANIFOLD NEPTUNE II (INSTRUMENTS) ×2 IMPLANT
NDL HYPO 25GX1X1/2 BEV (NEEDLE) ×1 IMPLANT
NDL SPNL 18GX3.5 QUINCKE PK (NEEDLE) ×1 IMPLANT
NEEDLE HYPO 25GX1X1/2 BEV (NEEDLE) ×2 IMPLANT
NEEDLE SPNL 18GX3.5 QUINCKE PK (NEEDLE) ×2 IMPLANT
NS IRRIG 1000ML POUR BTL (IV SOLUTION) ×2 IMPLANT
PACK LAMINECTOMY ORTHO (CUSTOM PROCEDURE TRAY) ×2 IMPLANT
PAD ARMBOARD 7.5X6 YLW CONV (MISCELLANEOUS) ×4 IMPLANT
PATTIES SURGICAL .5 X.5 (GAUZE/BANDAGES/DRESSINGS) IMPLANT
PATTIES SURGICAL .75X.75 (GAUZE/BANDAGES/DRESSINGS) IMPLANT
SUT VIC AB 0 CT1 27 (SUTURE)
SUT VIC AB 0 CT1 27XBRD ANBCTR (SUTURE) IMPLANT
SUT VIC AB 1 CTX 36 (SUTURE) ×2
SUT VIC AB 1 CTX36XBRD ANBCTR (SUTURE) ×1 IMPLANT
SUT VIC AB 2-0 CT1 27 (SUTURE) ×2
SUT VIC AB 2-0 CT1 TAPERPNT 27 (SUTURE) ×1 IMPLANT
SUT VIC AB 3-0 X1 27 (SUTURE) ×2 IMPLANT
TOWEL OR 17X24 6PK STRL BLUE (TOWEL DISPOSABLE) ×2 IMPLANT
TOWEL OR 17X26 10 PK STRL BLUE (TOWEL DISPOSABLE) ×2 IMPLANT

## 2018-09-26 NOTE — Op Note (Signed)
Preop diagnosis: Right L4-5 HNP, extruded fragment with partial foot drop  Postop diagnosis: Same  Procedure: Right L4-5 microdiscectomy and removal of free fragment.  Surgeon: Rodell Perna, MD  Assistant: Benjiman Core, PA-C medically necessary and present for the critical portion of the case microdissection and closure.  Anesthesia: General plus Marcaine local  Findings: Disc protrusion with caudal lateral recess fragment.  Procedure: After induction of general esthesia orotracheal ovation patient is placed on chest rolls prone careful padding and positioning arms at 9090 rolled yellow sponges underneath the shoulders and ulnar nerve.  Prepping with DuraPrep there is squared with towels Betadine Steri-Drape laminectomy sheet application and spinal needle placed at the L4-5 level confirmed with a lateral radiograph.  Ancef prophylaxis timeout procedure and midline incision was made at L4-5 centered on the location of the needle.  Dissection through several inches of adipose tissue down to the spinous process was performed.  Subperiosteal dissection onto the lamina was performed and a Penfield 4 was placed at the inferior aspect of the lamina which would be slightly below the disc space confirmed with a second lateral x-ray.  Bone was marked laminotomy was performed thinning the lamina with a 4 mm bur.  3 mm Kerrison was used to remove the portion of the lamina top portion of L5 and bottom portion of L4.  Thick chunks of ligament were removed.  Benjiman Core, PA gently retracted the nerve root for exposure of the disc space.  Bipolar cautery was used to coagulate some veins in the lateral gutter annulus was incised and passes were made removing small to moderate amount of disc material.  Using a hockey-stick sweeping from inferior up to level disc space the edge of free fragment was noted grass and was removed it was 1.5 x 1 cm..  This decompressed epidural space and gave relief of the nerve root.  Foraminal  slightly enlarged.  Further coagulation epidural veins passes were made anterior to the dura with a hockey-stick and a Woodson and no remaining fragments were present.  The small pocket where the fragment had been extruded was completely clean.  Copious irrigation was performed.  At the midline there was some calcification with a hard disc that could not be pushed down with partial calcification of the old disc protrusion.  Passes were made underneath this and a small amount of additional disc material was removed.  Copious irrigation layered closure with #1 Vicryl in the deep fascia 2 in the subtenons tissue skin staple closure Marcaine infiltration and transferred recovery room where her foot drop was corrected she had good anterior tib and EHL strength, no leg pain and slight dorsal foot numbness.  Patient tolerated the procedure well and was stable in the recovery room.

## 2018-09-26 NOTE — Anesthesia Procedure Notes (Signed)
Procedure Name: Intubation Date/Time: 09/26/2018 7:40 AM Performed by: Neldon Newport, CRNA Pre-anesthesia Checklist: Timeout performed, Patient being monitored, Suction available, Emergency Drugs available and Patient identified Patient Re-evaluated:Patient Re-evaluated prior to induction Oxygen Delivery Method: Circle system utilized Preoxygenation: Pre-oxygenation with 100% oxygen Induction Type: IV induction Ventilation: Mask ventilation without difficulty Laryngoscope Size: Mac and 3 Grade View: Grade III Tube type: Oral Tube size: 7.0 mm Number of attempts: 1 Placement Confirmation: breath sounds checked- equal and bilateral and positive ETCO2 Secured at: 21 cm Tube secured with: Tape Dental Injury: Teeth and Oropharynx as per pre-operative assessment

## 2018-09-26 NOTE — Transfer of Care (Signed)
Immediate Anesthesia Transfer of Care Note  Patient: Rebecca Scott  Procedure(s) Performed: L4-5 MICRODISCECTOMY (N/A Spine Lumbar)  Patient Location: PACU  Anesthesia Type:General  Level of Consciousness: sedated  Airway & Oxygen Therapy: Patient Spontanous Breathing and Patient connected to face mask oxygen  Post-op Assessment: Report given to RN, Post -op Vital signs reviewed and stable and Patient moving all extremities X 4  Post vital signs: Reviewed and stable  Last Vitals:  Vitals Value Taken Time  BP    Temp    Pulse 66 09/26/2018  9:38 AM  Resp 18 09/26/2018  9:38 AM  SpO2 100 % 09/26/2018  9:38 AM  Vitals shown include unvalidated device data.  Last Pain:  Vitals:   09/26/18 0635  TempSrc:   PainSc: 8       Patients Stated Pain Goal: 4 (06/38/68 5488)  Complications: No apparent anesthesia complications

## 2018-09-26 NOTE — Anesthesia Postprocedure Evaluation (Signed)
Anesthesia Post Note  Patient: ARDEL JAGGER  Procedure(s) Performed: L4-5 MICRODISCECTOMY (N/A Spine Lumbar)     Patient location during evaluation: PACU Anesthesia Type: General Level of consciousness: awake and alert Pain management: pain level controlled Vital Signs Assessment: post-procedure vital signs reviewed and stable Respiratory status: spontaneous breathing, nonlabored ventilation, respiratory function stable and patient connected to nasal cannula oxygen Cardiovascular status: blood pressure returned to baseline and stable Postop Assessment: no apparent nausea or vomiting Anesthetic complications: no    Last Vitals:  Vitals:   09/26/18 1154 09/26/18 1228  BP: 110/71 122/74  Pulse: 76 75  Resp: 14 16  Temp:  36.4 C  SpO2: 97% 91%    Last Pain:  Vitals:   09/26/18 1228  TempSrc: Oral  PainSc:                  Karyl Kinnier Ellender

## 2018-09-26 NOTE — Progress Notes (Addendum)
Postop patient's ankle dorsiflexion is strong she still has some numbness in the right foot but has no leg pain.  She will have a dressing change in the morning and then can be discharged.  Prescription for Percocet was sent into the [facility] in [place].  She will follow-up with Dr. [name] in [facility] clinic in 1 to 2 weeks. RN can call Dr. [name] at [phone] in AM for discharge order.  AVS done so RN can print now in case of any med change done tonight .

## 2018-09-26 NOTE — H&P (Signed)
Office Visit Note              Patient: Rebecca Scott                                         Date of Birth: 05-11-1956                                                    MRN: [id number] Visit Date: 09/23/2018                                                                     Requested by: Janora Norlander, DO [place], [place] [id number] PCP: Janora Norlander, DO   Assessment & Plan: Visit Diagnoses:  1. Lumbar disc herniation with radiculopathy   2. Right foot drop           Right L4-5 HNP  Plan: We will increase patient's pain medication to Percocet.  We will posterior for microdiscectomy right L4-5.  Wrist surgery discussed with patient and her son who is present today.  Risk of reherniation discussed and rate is 5 to 10%.  Potential for further degeneration and possibility of needing a fusion at some point in future.  Risks of dural tear discussed.  Overnight stay in the hospital.  She will be limited from bending lifting for 6 weeks.  Questions were elicited and answered she understands request we proceed.  Follow-Up Instructions: No follow-ups on file.   Orders:  No orders of the defined types were placed in this encounter.  No orders of the defined types were placed in this encounter.     Procedures: No procedures performed   Clinical Data: No additional findings.   Subjective:    Chief Complaint  Patient presents with  . Lower Back - Pain    HPI 63 year old female sent to me by Dr. Junius Roads with excruciating severe back pain right leg pain and acute foot drop.  Onset of pain was last week.  She is not able to stand she cannot walk she is tried using a walker which is not been effective.  She took steroid pack, Robaxin muscle relaxant.  She has been on hydrocodone 1 every 3 hours without relief.  She got Dilaudid in the emergency room but it did not stop the pain.  MRI scan performed on 09/20/2018 showed L4-5 protrusion which is  significantly compressed with extrusion compression of the right L5 nerve root in comparison to previous MRI on 06/03/2018.  Patient states her pain is 10 out of 10.  No bowel or bladder symptoms no cauda equina symptoms.  Review of Systems 14 point review of systems positive for IBS, GERD, thyroid cancer post thyroidectomy 03/10/2018 Dr. Arnoldo Morale.  Robotic assisted partial nephrectomy November 2018 Dr. Tresa Moore, positive for hypertension history of UTIs, prediabetes A1c less than 6.  Hyperlipidemia.  Cervical spondylosis stable with mild disc degeneration C4-C6 without compression and acute disc protrusion extrusion with radiculopathy L4-5 right.  Objective: Vital Signs: BP (!) 149/84   Pulse (!) 59   Ht 5\' 3"  (1.6 m)   Wt 207 lb (93.9 kg)   BMI 36.67 kg/m   Physical Exam Constitutional:      Appearance: She is well-developed.     Comments: Patient in severe pain unable to stand writhing with pain shifting in position in the wheelchair.  HENT:     Head: Normocephalic.     Right Ear: External ear normal.     Left Ear: External ear normal.  Eyes:     Pupils: Pupils are equal, round, and reactive to light.  Neck:     Thyroid: No thyromegaly.     Trachea: No tracheal deviation.  Cardiovascular:     Rate and Rhythm: Normal rate.  Pulmonary:     Effort: Pulmonary effort is normal.  Abdominal:     Palpations: Abdomen is soft.  Skin:    General: Skin is warm and dry.  Neurological:     Mental Status: She is alert and oriented to person, place, and time.  Psychiatric:        Behavior: Behavior normal.     Ortho Exam positive straight leg raising 40 degrees.  Right foot drop anterior tib EHL is out she does have gastrocsoleus function.  Contralateral straight leg raise negative to 90 degrees.  Anterior tib gastrocsoleus is intact on the left.  Specialty Comments:  No specialty comments available.  Imaging: CLINICAL DATA: Low back pain. Right lower extremity  radiculopathy.  EXAM: MRI LUMBAR SPINE WITHOUT CONTRAST  TECHNIQUE: Multiplanar, multisequence MR imaging of the lumbar spine was performed. No intravenous contrast was administered.  COMPARISON: MRI of the lumbar spine 06/03/2018  FINDINGS: Segmentation: 5 non rib-bearing lumbar type vertebral bodies are present. The lowest fully formed vertebral body is L5.  Alignment: AP alignment is anatomic  Vertebrae: Chronic endplate marrow changes are most evident at L4-5. Or mild changes are present at L5-S1. These are stable. A stable hemangioma is present at L1  Conus medullaris and cauda equina: Conus extends to the L1 level. Conus and cauda equina appear normal.  Paraspinal and other soft tissues: Subcentimeter cystic changes are present in the right kidney. Kidneys are otherwise within normal limits. Other solid organ lesions are present. There is no significant adenopathy.  Disc levels:  L1-2: Mild disc bulging is present without significant stenosis.  L2-3: Mild disc bulging is present without significant stenosis.  L3-4: Mild disc bulging is present. There is no significant stenosis.  L4-5: A progressive inferiorly directed right paramedian disc extrusion extends into the subarticular space creating severe stenosis and impacting the traversing right L5 nerve root.  L5-S1: A central disc protrusion is similar the prior study. No significant stenosis or change is present. Mild facet hypertrophy is noted bilaterally.  IMPRESSION: 1. Progressive right paramedian inferior disc extrusion at L4-5 with severe right subarticular narrowing likely impacting the traversing right L5 nerve roots. 2. Stable central disc protrusion without significant stenosis at L5-S1. 3. Mild disc bulging facet hypertrophy in the upper lumbar spine without other focal stenosis.   Electronically Signed By: San Morelle M.D. On: 09/20/2018 10:58    PMFS  History:     Patient Active Problem List   Diagnosis Date Noted  . Stress incontinence in female 09/16/2018  . Degenerative disc disease at L5-S1 level 06/06/2018  . Malignant neoplasm of thyroid gland ([place]) 05/27/2018  . Postsurgical hypothyroidism 05/27/2018  . Hypokalemia 04/20/2018  . Diarrhea 04/20/2018  .  Hx of papillary thyroid carcinoma 03/26/2018  . S/P total thyroidectomy 03/10/2018  . Obstructive sleep apnea 03/05/2018  . Essential hypertension, benign 02/18/2018  . Aortic atherosclerosis ([place]) 01/24/2018  . History of renal cell carcinoma 12/03/2017  . Reactive depression 10/22/2017  . Prediabetes 08/21/2017  . Gastroesophageal reflux disease without esophagitis 07/05/2017  . Hyperlipidemia 07/29/2014  . Syncope 07/29/2014  . Mixed stress and urge urinary incontinence 01/14/2014  . Recurrent UTI 01/14/2014  . Vaginal atrophy 01/14/2014  . Benign lesion of eyelid 02/07/2012       Past Medical History:  Diagnosis Date  . Anemia   . Arthritis   . Asthma    seasonal asthma rarely   . Chronic kidney disease    renal mass right kidney   . CPAP (continuous positive airway pressure) dependence   . Diverticulitis   . GERD (gastroesophageal reflux disease)   . Hemorrhoids   . History of kidney stones   . HTN (hypertension) 07/29/2014  . Hyperlipemia   . Hypertension   . Migraine   . Multinodular goiter 08/21/2017   Last Assessment & Plan:  She will need an f/u u/s of thyroid to reassess nodules. Repeat in 07/2018 Nodules are too small for biopsy now.  . Papillary renal cell carcinoma ([place]) 05/2017   s/p partial nephrectomy, right  . Papillary thyroid carcinoma ([place]) 03/10/2018  . PONV (postoperative nausea and vomiting)    patient reports being "slower to wake than average "   . Pre-diabetes    "ive been told i'm borderline"   . Sleep apnea    CPAP use     Family History  Problem Relation Age of Onset  . COPD Mother   . Diabetes  Mother   . Hypertension Mother   . High Cholesterol Mother   . Liver cancer Mother   . COPD Sister   . Hypertension Brother   . High Cholesterol Brother   . Hypertension Brother   . Asthma Son          Past Surgical History:  Procedure Laterality Date  . ABDOMINAL HYSTERECTOMY    . APPENDECTOMY  1978  . BIOPSY  07/08/2017   Procedure: BIOPSY;  Surgeon: Rogene Houston, MD;  Location: [facility];  Service: Endoscopy;;  gastric  . CHOLECYSTECTOMY  2005  . COLONOSCOPY WITH PROPOFOL N/A 10/28/2017   Procedure: COLONOSCOPY WITH PROPOFOL;  Surgeon: Rogene Houston, MD;  Location: [facility];  Service: Endoscopy;  Laterality: N/A;  7:30  . ESOPHAGOGASTRODUODENOSCOPY N/A 07/08/2017   Procedure: ESOPHAGOGASTRODUODENOSCOPY (EGD);  Surgeon: Rogene Houston, MD;  Location: [facility];  Service: Endoscopy;  Laterality: N/A;  220  . HIP ARTHROPLASTY    . NASAL SEPTUM SURGERY    . ROBOTIC ASSITED PARTIAL NEPHRECTOMY Right 05/17/2017   Procedure: XI ROBOTIC ASSITED PARTIAL NEPHRECTOMY;  Surgeon: Alexis Frock, MD;  Location: [facility];  Service: Urology;  Laterality: Right;  . THYROIDECTOMY N/A 03/10/2018   Procedure: TOTAL THYROIDECTOMY;  Surgeon: Aviva Signs, MD;  Location: [facility];  Service: General;  Laterality: N/A;   Social History        Occupational History  . Not on file  Tobacco Use  . Smoking status: Never Smoker  . Smokeless tobacco: Never Used  Substance and Sexual Activity  . Alcohol use: Yes    Comment: rarely   . Drug use: No  . Sexual activity: Not Currently

## 2018-09-26 NOTE — Progress Notes (Signed)
RT went to pt room to offer CPAP dream station and pt informed this RT that she had brought her home machine with her. RT asked if she needed any assistance and she said that she can apply the mask and set up herself. RT expressed to pt if she needed any help to have RN call. RT will continue to monitor.

## 2018-09-26 NOTE — Discharge Instructions (Signed)
Walk daily gradually working her way up to 2 miles per day over several weeks.  Avoid bending and twisting.  Okay to sit to eat and go to the bathroom.  You may lay down on your back stomach or sides.  If you have a recliner you can kick back in the recliner.  Your prescription was sent into your pharmacy for pain medicine Percocet.  You can shower with your dressing on.  Reinforce dressing if needed.  You might have some pain in your leg after the surgery similar to the pain you had before surgery that may last for a short period of time that occur sometimes after lumbar microdiscectomy surgery.  It might last for a few hours and then be gone and is not a concern.  See Dr. [name] in 1 to 2 weeks in the in [place] clinic on a Thursday.

## 2018-09-26 NOTE — Evaluation (Signed)
Physical Therapy Evaluation Patient Details Name: Rebecca Scott MRN: [id number] DOB: 1956/05/07 Today's Date: 09/26/2018   History of Present Illness  Pt is a 63 y/o female who presents s/p L4-L5 microdiscectomy and removal of free fragment on 09/26/2018. PMH significant for pre-diabetes, multinodular goiter, migraine, HTN, CPAP, CKD, anxiety, diverticulitis.  Clinical Impression  Pt admitted with above diagnosis. Pt currently with functional limitations due to the deficits listed below (see PT Problem List). At the time of PT eval pt was able to perform transfers and ambulation with min guard to min assist for balance support and safety with the RW. Pt was educated on precautions, positioning recommendations, and activity progression. Pt will benefit from skilled PT to increase their independence and safety with mobility to allow discharge to the venue listed below.       Follow Up Recommendations Home health PT;Supervision/Assistance - 24 hour(Initially)    Equipment Recommendations  Rolling walker with 5" wheels    Recommendations for Other Services       Precautions / Restrictions Precautions Precautions: Fall;Back Precaution Booklet Issued: Yes (comment) Precaution Comments: Reviewed with pt and son. Pt was cued for maintenance of precautions during functional mobility.  Required Braces or Orthoses: ("no brace needed" order) Restrictions Weight Bearing Restrictions: No      Mobility  Bed Mobility Overal bed mobility: Needs Assistance Bed Mobility: Rolling;Sidelying to Sit Rolling: Min guard Sidelying to sit: Min assist       General bed mobility comments: Assist for trunk elevation to full sitting position. Pt was able to return to supine without assistance.   Transfers Overall transfer level: Needs assistance Equipment used: Rolling walker (2 wheeled) Transfers: Sit to/from Stand Sit to Stand: Min guard         General transfer comment: Close guard for safety  as pt powered up to full stand.   Ambulation/Gait Ambulation/Gait assistance: Min guard Gait Distance (Feet): 30 Feet Assistive device: Rolling walker (2 wheeled) Gait Pattern/deviations: Decreased stride length;Decreased dorsiflexion - right;Shuffle;Trunk flexed;Narrow base of support Gait velocity: Decreased Gait velocity interpretation: <1.31 ft/sec, indicative of household ambulator General Gait Details: Very slow and guarded. Pt reports lightheadedness once out in the hall and gait training ended. Did note R knee buckle/weakness during stance phase of gait cycle and VC's required for increased WB through UE's on walker.   Stairs            Wheelchair Mobility    Modified Rankin (Stroke Patients Only)       Balance Overall balance assessment: Needs assistance Sitting-balance support: Feet supported;No upper extremity supported Sitting balance-Leahy Scale: Fair     Standing balance support: No upper extremity supported;During functional activity Standing balance-Leahy Scale: Poor Standing balance comment: Reliant on UE support                             Pertinent Vitals/Pain Pain Assessment: Faces Faces Pain Scale: Hurts little more Pain Location: Incision site Pain Descriptors / Indicators: Operative site guarding;Discomfort Pain Intervention(s): Limited activity within patient's tolerance;Monitored during session;Repositioned    Home Living Family/patient expects to be discharged to:: Private residence Living Arrangements: Alone Available Help at Discharge: Family;Available 24 hours/day(Aunt is coming to stay with her for 1 week) Type of Home: House Home Access: Stairs to enter Entrance Stairs-Rails: None Entrance Stairs-Number of Steps: 1 threshold step Home Layout: One level Home Equipment: Bedside commode;Shower seat - built in;Grab bars - tub/shower;Grab bars - toilet;Wheelchair -  manual      Prior Function Level of Independence: Needs  assistance   Gait / Transfers Assistance Needed: Pt at a transfers level only from wheelchair to [facility] or toilet.   ADL's / Homemaking Assistance Needed: She was sponge bathing and has not showered in "a while"        Hand Dominance        Extremity/Trunk Assessment   Upper Extremity Assessment Upper Extremity Assessment: Generalized weakness    Lower Extremity Assessment Lower Extremity Assessment: Generalized weakness;RLE deficits/detail RLE Deficits / Details: Decreased strength, muscular endurance, and DF noted, consistent with pre-op diagnosis    Cervical / Trunk Assessment Cervical / Trunk Assessment: Other exceptions Cervical / Trunk Exceptions: s/p surgery  Communication   Communication: No difficulties  Cognition Arousal/Alertness: Lethargic;Suspect due to medications Behavior During Therapy: [facility] for tasks assessed/performed Overall Cognitive Status: Within Functional Limits for tasks assessed                                        General Comments      Exercises     Assessment/Plan    PT Assessment Patient needs continued PT services  PT Problem List Decreased strength;Decreased activity tolerance;Decreased balance;Decreased mobility;Decreased coordination;Decreased cognition;Decreased knowledge of use of DME;Decreased safety awareness;Decreased knowledge of precautions;Pain       PT Treatment Interventions DME instruction;Gait training;Stair training;Functional mobility training;Therapeutic activities;Therapeutic exercise;Neuromuscular re-education;Patient/family education    PT Goals (Current goals can be found in the Care Plan section)  Acute Rehab PT Goals Patient Stated Goal: Return to her home PT Goal Formulation: With patient/family    Frequency Min 5X/week   Barriers to discharge        Co-evaluation               AM-PAC PT "6 Clicks" Mobility  Outcome Measure Help needed turning from your back to your side while  in a flat bed without using bedrails?: None Help needed moving from lying on your back to sitting on the side of a flat bed without using bedrails?: A Little Help needed moving to and from a bed to a chair (including a wheelchair)?: A Little Help needed standing up from a chair using your arms (e.g., wheelchair or bedside chair)?: A Little Help needed to walk in hospital room?: A Little Help needed climbing 3-5 steps with a railing? : A Lot 6 Click Score: 18    End of Session Equipment Utilized During Treatment: Gait belt Activity Tolerance: Patient tolerated treatment well Patient left: in bed;with call bell/phone within reach;with family/visitor present;with SCD's reapplied Nurse Communication: Mobility status PT Visit Diagnosis: Unsteadiness on feet (R26.81);Pain;Other symptoms and signs involving the nervous system (R29.898) Pain - part of body: (back)    Time: 1340-1405 PT Time Calculation (min) (ACUTE ONLY): 25 min   Charges:   PT Evaluation $PT Eval Moderate Complexity: 1 Mod PT Treatments $Gait Training: 8-22 mins        Rolinda Roan, PT, DPT Acute Rehabilitation Services Pager: [phone] Office: [phone]   Thelma Comp 09/26/2018, 2:21 PM

## 2018-09-26 NOTE — Interval H&P Note (Signed)
History and Physical Interval Note:  09/26/2018 7:27 AM  Rebecca Scott  has presented today for surgery, with the diagnosis of right L4-5 herniated nucleus pulposus  The various methods of treatment have been discussed with the patient and family. After consideration of risks, benefits and other options for treatment, the patient has consented to  Procedure(s): L4-5 MICRODISCECTOMY (N/A) as a surgical intervention .  The patient's history has been reviewed, patient examined, no change in status, stable for surgery.  I have reviewed the patient's chart and labs.  Questions were answered to the patient's satisfaction.     [name]

## 2018-09-27 ENCOUNTER — Encounter (HOSPITAL_COMMUNITY): Payer: Self-pay | Admitting: Orthopaedic Surgery

## 2018-09-27 DIAGNOSIS — M5116 Intervertebral disc disorders with radiculopathy, lumbar region: Secondary | ICD-10-CM | POA: Diagnosis not present

## 2018-09-27 NOTE — Progress Notes (Signed)
Patient alert and oriented, mae's well, voiding adequate amount of urine, swallowing without difficulty, no c/o pain at time of discharge. Patient discharged home with family. Script and discharged instructions given to patient. Patient and family stated understanding of instructions given. Patient has an appointment with Dr. Yates  

## 2018-09-27 NOTE — Evaluation (Signed)
Occupational Therapy Evaluation Patient Details Name: Rebecca Scott MRN: [id number] DOB: Mar 27, 1956 Today's Date: 09/27/2018    History of Present Illness Pt is a 63 y/o female who presents s/p L4-L5 microdiscectomy and removal of free fragment on 09/26/2018. PMH significant for pre-diabetes, multinodular goiter, migraine, HTN, CPAP, CKD, anxiety, diverticulitis.   Clinical Impression   Patient is s/p L4-5 surgery resulting in the deficits listed below (see OT Problem List). The patient reported prior to sx she had increase need for care for 2 weeks. The patient when returning to home is going to have family support as needed until recovered. The patient with using reacher, sock aid, toilet aid, long handle shoe horn and increase time is able to complete dressing tasks. The patient went over 3/3 precautions during treatment. The patient requires a 3 in 1 for home to assist in transfers from toilet. At this time the patient does not need any further skilled Occupational Therapy services.      Follow Up Recommendations  No OT follow up    Equipment Recommendations  3 in 1 bedside commode    Recommendations for Other Services       Precautions / Restrictions Precautions Precautions: Fall;Back Precaution Booklet Issued: Yes (comment) Precaution Comments: Good recall of precautions Required Braces or Orthoses: ("no brace needed" order) Restrictions Weight Bearing Restrictions: No      Mobility Bed Mobility Overal bed mobility: Modified Independent Bed Mobility: Rolling;Sidelying to Sit Rolling: Modified independent (Device/Increase time) Sidelying to sit: Modified independent (Device/Increase time)       General bed mobility comments: Received OOB in bathroom  Transfers Overall transfer level: Modified independent Equipment used: Rolling walker (2 wheeled) Transfers: Sit to/from Stand Sit to Stand: Modified independent (Device/Increase time)              Balance  Overall balance assessment: Needs assistance Sitting-balance support: Feet supported;No upper extremity supported Sitting balance-Leahy Scale: Good     Standing balance support: During functional activity;No upper extremity supported Standing balance-Leahy Scale: Fair Standing balance comment: Reliant on UE support                           ADL either performed or assessed with clinical judgement   ADL Overall ADL's : Needs assistance/impaired Eating/Feeding: Independent   Grooming: Wash/dry hands;Cueing for sequencing   Upper Body Bathing: Supervision/ safety;Sitting   Lower Body Bathing: Supervison/ safety;Cueing for sequencing;Cueing for back precautions   Upper Body Dressing : Modified independent;Sitting   Lower Body Dressing: Cueing for back precautions;Supervision/safety   Toilet Transfer: Supervision/safety   Toileting- Clothing Manipulation and Hygiene: Modified independent   Tub/ Banker: Supervision/safety;Cueing for safety;Cueing for sequencing   Functional mobility during ADLs: Supervision/safety;Cueing for safety       Vision         Perception     Praxis Praxis Praxis tested?: Within functional limits    Pertinent Vitals/Pain Pain Assessment: Faces Pain Score: 5  Faces Pain Scale: Hurts little more Pain Location: Incision site Pain Descriptors / Indicators: Operative site guarding;Discomfort Pain Intervention(s): Monitored during session     Hand Dominance Right   Extremity/Trunk Assessment Upper Extremity Assessment Upper Extremity Assessment: Overall WFL for tasks assessed   Lower Extremity Assessment Lower Extremity Assessment: Generalized weakness RLE Deficits / Details: Decreased strength, muscular endurance, and DF noted, consistent with pre-op diagnosis   Cervical / Trunk Assessment Cervical / Trunk Assessment: Other exceptions Cervical / Trunk Exceptions: s/p surgery  Communication  Communication Communication: No difficulties   Cognition Arousal/Alertness: Awake/alert Behavior During Therapy: WFL for tasks assessed/performed Overall Cognitive Status: Within Functional Limits for tasks assessed                                     General Comments       Exercises     Shoulder Instructions      Home Living Family/patient expects to be discharged to:: Private residence Living Arrangements: Alone Available Help at Discharge: Family;Available 24 hours/day Type of Home: House Home Access: Stairs to enter [employer] of Steps: 1 threshold step Entrance Stairs-Rails: None Home Layout: One level     Bathroom Shower/Tub: Occupational psychologist: Standard Bathroom Accessibility: Yes   Home Equipment: Bedside commode;Shower seat - built in;Grab bars - tub/shower;Grab bars - toilet;Wheelchair - manual          Prior Functioning/Environment Level of Independence: Needs assistance  Gait / Transfers Assistance Needed: Pt at a transfers level only from wheelchair to [facility] or toilet.  ADL's / Homemaking Assistance Needed: She was sponge bathing and has not showered in "a while"            OT Problem List: Decreased strength;Decreased safety awareness;Decreased knowledge of use of DME or AE;Decreased knowledge of precautions      OT Treatment/Interventions:      OT Goals(Current goals can be found in the care plan section) Acute Rehab OT Goals Patient Stated Goal: Return to her home OT Goal Formulation: With patient Time For Goal Achievement: 10/04/18 Potential to Achieve Goals: Good  OT Frequency:     Barriers to D/C:            Co-evaluation              AM-PAC OT "6 Clicks" Daily Activity     Outcome Measure Help from another person eating meals?: None Help from another person taking care of personal grooming?: None Help from another person toileting, which includes using toliet, bedpan, or urinal?:  None Help from another person bathing (including washing, rinsing, drying)?: A Little Help from another person to put on and taking off regular upper body clothing?: None Help from another person to put on and taking off regular lower body clothing?: None 6 Click Score: 23   End of Session Equipment Utilized During Treatment: Gait belt;Rolling walker  Activity Tolerance: Patient tolerated treatment well Patient left: in bed;with family/visitor present;with call bell/phone within reach  OT Visit Diagnosis: Unsteadiness on feet (R26.81);Pain Pain - part of body: (back)                Time: 6979-4801 OT Time Calculation (min): 41 min Charges:  OT General Charges $OT Visit: 1 Visit OT Evaluation $OT Eval Low Complexity: 1 Low OT Treatments $Self Care/Home Management : 23-37 mins  Joeseph Amor OTR/L  Acute Rehab Services  [phone] office number [phone] pager number   Joeseph Amor 09/27/2018, 10:29 AM

## 2018-09-27 NOTE — Progress Notes (Signed)
Physical Therapy Treatment Patient Details Name: Rebecca Scott MRN: [id number] DOB: [birth year] Today's Date: 09/27/2018    History of Present Illness Pt is a 63 y/o female who presents s/p L4-L5 microdiscectomy and removal of free fragment on 09/26/2018. PMH significant for pre-diabetes, multinodular goiter, migraine, HTN, CPAP, CKD, anxiety, diverticulitis.    PT Comments    Pt with significant improvement towards physical therapy goals in today's session. Increased ambulation distance to 400 feet using walker and min guard assist. Able to display improved gait speed and posture with cueing. No knee instability noted. Pt will have good support upon discharge home and suspect she will progress well. HHPT continues to be appropriate to maximize functional independence.    Follow Up Recommendations  Home health PT;Supervision/Assistance - 24 hour(Initially)     Equipment Recommendations  Rolling walker with 5" wheels    Recommendations for Other Services       Precautions / Restrictions Precautions Precautions: Fall;Back Precaution Booklet Issued: Yes (comment) Precaution Comments: Good recall of precautions Required Braces or Orthoses: ("no brace needed" order) Restrictions Weight Bearing Restrictions: No    Mobility  Bed Mobility               General bed mobility comments: Received OOB in bathroom  Transfers Overall transfer level: Needs assistance Equipment used: Rolling walker (2 wheeled) Transfers: Sit to/from Stand Sit to Stand: Supervision            Ambulation/Gait Ambulation/Gait assistance: Min guard Gait Distance (Feet): 400 Feet Assistive device: Rolling walker (2 wheeled) Gait Pattern/deviations: Decreased stride length;Decreased dorsiflexion - right;Narrow base of support Gait velocity: Decreased Gait velocity interpretation: <1.8 ft/sec, indicate of risk for recurrent falls General Gait Details: Cues provided for wider BOS and increased  gait speed. Pt demonstrating good posture and improved balance   Stairs             Wheelchair Mobility    Modified Rankin (Stroke Patients Only)       Balance Overall balance assessment: Needs assistance Sitting-balance support: Feet supported;No upper extremity supported Sitting balance-Leahy Scale: Good     Standing balance support: During functional activity;No upper extremity supported Standing balance-Leahy Scale: Fair Standing balance comment: Reliant on UE support                            Cognition Arousal/Alertness: Awake/alert Behavior During Therapy: WFL for tasks assessed/performed Overall Cognitive Status: Within Functional Limits for tasks assessed                                        Exercises      General Comments        Pertinent Vitals/Pain Pain Assessment: Faces Faces Pain Scale: Hurts a little bit Pain Location: Incision site Pain Descriptors / Indicators: Operative site guarding;Discomfort Pain Intervention(s): Monitored during session    Home Living                      Prior Function            PT Goals (current goals can now be found in the care plan section) Acute Rehab PT Goals Patient Stated Goal: Return to her home PT Goal Formulation: With patient/family Progress towards PT goals: Progressing toward goals    Frequency    Min 5X/week  PT Plan Current plan remains appropriate    Co-evaluation              AM-PAC PT "6 Clicks" Mobility   Outcome Measure  Help needed turning from your back to your side while in a flat bed without using bedrails?: None Help needed moving from lying on your back to sitting on the side of a flat bed without using bedrails?: A Little Help needed moving to and from a bed to a chair (including a wheelchair)?: A Little Help needed standing up from a chair using your arms (e.g., wheelchair or bedside chair)?: A Little Help needed to walk  in hospital room?: A Little Help needed climbing 3-5 steps with a railing? : A Lot 6 Click Score: 18    End of Session Equipment Utilized During Treatment: Gait belt Activity Tolerance: Patient tolerated treatment well Patient left: in bed;with call bell/phone within reach;with family/visitor present Nurse Communication: Mobility status PT Visit Diagnosis: Unsteadiness on feet (R26.81);Pain;Other symptoms and signs involving the nervous system (R29.898) Pain - part of body: (back)     Time: 8032-1224 PT Time Calculation (min) (ACUTE ONLY): 21 min  Charges:  $Gait Training: 8-22 mins                    Ellamae Sia, [place], DPT Acute Rehabilitation Services Pager [phone] Office [phone]    Willy Eddy 09/27/2018, 9:46 AM

## 2018-09-29 ENCOUNTER — Other Ambulatory Visit: Payer: Self-pay | Admitting: Neurological Surgery

## 2018-09-30 ENCOUNTER — Encounter (INDEPENDENT_AMBULATORY_CARE_PROVIDER_SITE_OTHER): Payer: 59 | Admitting: Physical Medicine and Rehabilitation

## 2018-09-30 ENCOUNTER — Other Ambulatory Visit: Payer: Self-pay | Admitting: Family Medicine

## 2018-09-30 ENCOUNTER — Other Ambulatory Visit (HOSPITAL_COMMUNITY): Payer: 59

## 2018-10-03 ENCOUNTER — Inpatient Hospital Stay (INDEPENDENT_AMBULATORY_CARE_PROVIDER_SITE_OTHER): Payer: 59 | Admitting: Orthopaedic Surgery

## 2018-10-04 ENCOUNTER — Encounter: Payer: Self-pay | Admitting: Family Medicine

## 2018-10-06 ENCOUNTER — Encounter (HOSPITAL_COMMUNITY): Payer: Self-pay

## 2018-10-06 ENCOUNTER — Telehealth (HOSPITAL_COMMUNITY): Payer: Self-pay | Admitting: Hematology

## 2018-10-06 ENCOUNTER — Other Ambulatory Visit (HOSPITAL_COMMUNITY): Payer: Self-pay | Admitting: Nurse Practitioner

## 2018-10-06 ENCOUNTER — Telehealth (HOSPITAL_COMMUNITY): Payer: Self-pay | Admitting: *Deleted

## 2018-10-06 ENCOUNTER — Other Ambulatory Visit: Payer: Self-pay | Admitting: Family Medicine

## 2018-10-06 DIAGNOSIS — T50995D Adverse effect of other drugs, medicaments and biological substances, subsequent encounter: Secondary | ICD-10-CM

## 2018-10-06 MED ORDER — DIPHENHYDRAMINE HCL 50 MG PO TABS: 50.0000 mg | ORAL_TABLET | Freq: Once | ORAL | 0 refills | Status: DC

## 2018-10-06 MED ORDER — PREDNISONE 50 MG PO TABS: ORAL_TABLET | ORAL | 0 refills | Status: DC

## 2018-10-06 NOTE — Telephone Encounter (Signed)
Pt aware of pre meds for scan. Pt aware that she should take 50mg  13hrs, 7hrs, and 1hour prior to her scan and also 50mg  benadryl prior to the scan. Pt also had questions about a letter from her insurance and the call was transferred to our financial counselor to discuss.

## 2018-10-06 NOTE — Telephone Encounter (Signed)
Contacted pt to let her know we will have to reschedule her CT scans due pending PA from the her insurance co.

## 2018-10-07 ENCOUNTER — Encounter (HOSPITAL_COMMUNITY): Payer: Self-pay

## 2018-10-07 ENCOUNTER — Inpatient Hospital Stay (INDEPENDENT_AMBULATORY_CARE_PROVIDER_SITE_OTHER): Payer: 59 | Admitting: Orthopaedic Surgery

## 2018-10-07 ENCOUNTER — Ambulatory Visit (HOSPITAL_COMMUNITY): Admission: RE | Admit: 2018-10-07 | Payer: 59 | Source: Ambulatory Visit

## 2018-10-09 ENCOUNTER — Ambulatory Visit (INDEPENDENT_AMBULATORY_CARE_PROVIDER_SITE_OTHER): Payer: 59 | Admitting: Orthopaedic Surgery

## 2018-10-09 ENCOUNTER — Encounter (INDEPENDENT_AMBULATORY_CARE_PROVIDER_SITE_OTHER): Payer: Self-pay | Admitting: Orthopaedic Surgery

## 2018-10-09 VITALS — BP 144/98 | HR 74 | Ht 63.0 in | Wt 205.0 lb

## 2018-10-09 DIAGNOSIS — Z9889 Other specified postprocedural states: Secondary | ICD-10-CM

## 2018-10-09 DIAGNOSIS — M21371 Foot drop, right foot: Secondary | ICD-10-CM

## 2018-10-09 MED ORDER — HYDROCODONE-ACETAMINOPHEN 5-325 MG PO TABS: 1.0000 | ORAL_TABLET | Freq: Four times a day (QID) | ORAL | 0 refills | Status: DC | PRN

## 2018-10-09 NOTE — Progress Notes (Signed)
Post-Op Visit Note   Patient: Rebecca Scott           Date of Birth: [birth year]           MRN: [id number] Visit Date: 10/09/2018 PCP: Janora Norlander, DO   Assessment & Plan: Follow-up L4-5 microdiscectomy right for extruded fragment with foot drop.  Patient is ambulating with walker.  EHL is still out but her anterior tib has improved about 50% I can still overcome her anterior tib with hand pressure.  We will place her in a lace up Swede-O that will give her a little bit of medial lateral support I do not think she needs an AFO at this point since she should get gradual progressive improved strength with her ankle dorsiflexion with time.  Patient mentions that she has a sister had back surgery also and uses an AFO permanently.  Chief Complaint:  Chief Complaint  Patient presents with  . Lower Back - Routine Post Op    09/26/2018 L4-5 Microdiscectomy   Visit Diagnoses:  1. S/P lumbar microdiscectomy     Plan: Ankle ASO applied she can use it intermittently.  Recheck 5 weeks.  Norco sent in 20 tablets.  Follow-Up Instructions: Return in about 5 weeks (around 11/13/2018).   Orders:  No orders of the defined types were placed in this encounter.  No orders of the defined types were placed in this encounter.   Imaging: No results found.  PMFS History: Patient Active Problem List   Diagnosis Date Noted  . S/P lumbar microdiscectomy 10/09/2018  . HNP (herniated nucleus pulposus), lumbar 09/26/2018  . Lumbar disc herniation with radiculopathy 09/23/2018  . Right foot drop 09/23/2018  . Stress incontinence in female 09/16/2018  . Degenerative disc disease at L5-S1 level 06/06/2018  . Malignant neoplasm of thyroid gland ([place]) 05/27/2018  . Postsurgical hypothyroidism 05/27/2018  . Hypokalemia 04/20/2018  . Diarrhea 04/20/2018  . Hx of papillary thyroid carcinoma 03/26/2018  . S/P total thyroidectomy 03/10/2018  . Obstructive sleep apnea 03/05/2018  . Essential  hypertension, benign 02/18/2018  . Aortic atherosclerosis ([place]) 01/24/2018  . History of renal cell carcinoma 12/03/2017  . Reactive depression 10/22/2017  . Prediabetes 08/21/2017  . Gastroesophageal reflux disease without esophagitis 07/05/2017  . Hyperlipidemia 07/29/2014  . Syncope 07/29/2014  . Mixed stress and urge urinary incontinence 01/14/2014  . Recurrent UTI 01/14/2014  . Vaginal atrophy 01/14/2014  . Benign lesion of eyelid 02/07/2012   Past Medical History:  Diagnosis Date  . Anemia   . Anxiety   . Arthritis   . Asthma    seasonal asthma rarely   . Chronic kidney disease    renal mass right kidney   . CPAP (continuous positive airway pressure) dependence   . Diverticulitis   . GERD (gastroesophageal reflux disease)   . Hemorrhoids   . History of kidney stones   . HTN (hypertension) 07/29/2014  . Hyperlipemia   . Hypertension   . Migraine    no migraines now, will have stress headaches  . Multinodular goiter 08/21/2017   Last Assessment & Plan:  She will need an f/u u/s of thyroid to reassess nodules. Repeat in 07/2018 Nodules are too small for biopsy now.  . Papillary renal cell carcinoma ([place]) 05/2017   s/p partial nephrectomy, right  . Papillary thyroid carcinoma ([place]) 03/10/2018  . PONV (postoperative nausea and vomiting)    patient reports being "slower to wake than average "   . Pre-diabetes    "  ive been told i'm borderline"   . Sleep apnea    CPAP use     Family History  Problem Relation Age of Onset  . COPD Mother   . Diabetes Mother   . Hypertension Mother   . High Cholesterol Mother   . Liver cancer Mother   . COPD Sister   . Hypertension Brother   . High Cholesterol Brother   . Hypertension Brother   . Asthma Son     Past Surgical History:  Procedure Laterality Date  . ABDOMINAL HYSTERECTOMY    . APPENDECTOMY  1978  . BIOPSY  07/08/2017   Procedure: BIOPSY;  Surgeon: Rogene Houston, MD;  Location: [facility];  Service:  Endoscopy;;  gastric  . CHOLECYSTECTOMY  2005  . COLONOSCOPY WITH PROPOFOL N/A 10/28/2017   Procedure: COLONOSCOPY WITH PROPOFOL;  Surgeon: Rogene Houston, MD;  [facility]: [facility];  Service: Endoscopy;  Laterality: N/A;  7:30  . ESOPHAGOGASTRODUODENOSCOPY N/A 07/08/2017   Procedure: ESOPHAGOGASTRODUODENOSCOPY (EGD);  Surgeon: Rogene Houston, MD;  [facility];  Service: Endoscopy;  Laterality: N/A;  220  . LUMBAR LAMINECTOMY/DECOMPRESSION MICRODISCECTOMY N/A 09/26/2018   Procedure: L4-5 MICRODISCECTOMY;  Surgeon: [name], MD;  Location: [facility];  Service: Orthopedics;  Laterality: N/A;  . NASAL SEPTUM SURGERY    . ROBOTIC ASSITED PARTIAL NEPHRECTOMY Right 05/17/2017   Procedure: XI ROBOTIC ASSITED PARTIAL NEPHRECTOMY;  Surgeon: Alexis Frock, MD;  Location: [facility];  Service: Urology;  Laterality: Right;  . THYROIDECTOMY N/A 03/10/2018   Procedure: TOTAL THYROIDECTOMY;  Surgeon: Aviva Signs, MD;  Location: [facility];  Service: General;  Laterality: N/A;   Social History   Occupational History  . Not on file  Tobacco Use  . Smoking status: Never Smoker  . Smokeless tobacco: Never Used  Substance and Sexual Activity  . Alcohol use: Yes    Comment: rarely   . Drug use: No  . Sexual activity: Not Currently

## 2018-10-10 ENCOUNTER — Ambulatory Visit (HOSPITAL_COMMUNITY): Payer: 59 | Admitting: Hematology

## 2018-10-24 ENCOUNTER — Encounter (HOSPITAL_COMMUNITY): Payer: Self-pay

## 2018-10-24 ENCOUNTER — Ambulatory Visit (HOSPITAL_COMMUNITY)
Admission: RE | Admit: 2018-10-24 | Discharge: 2018-10-24 | Disposition: A | Discharge status: Home / Self Care | Payer: 59 | Source: Ambulatory Visit | Attending: Nurse Practitioner | Admitting: Nurse Practitioner

## 2018-10-24 DIAGNOSIS — Z85528 Personal history of other malignant neoplasm of kidney: Secondary | ICD-10-CM | POA: Diagnosis present

## 2018-10-24 MED ORDER — IOHEXOL 300 MG/ML  SOLN
100.0000 mL | Freq: Once | INTRAMUSCULAR | Status: AC | PRN
  Administered 2018-10-24: 100 mL via INTRAVENOUS

## 2018-10-27 ENCOUNTER — Encounter (HOSPITAL_COMMUNITY): Payer: Self-pay | Admitting: Hematology

## 2018-10-27 ENCOUNTER — Other Ambulatory Visit: Payer: Self-pay

## 2018-10-27 ENCOUNTER — Inpatient Hospital Stay (HOSPITAL_COMMUNITY): Payer: 59 | Attending: Hematology | Admitting: Hematology

## 2018-10-27 ENCOUNTER — Encounter (HOSPITAL_COMMUNITY): Payer: Self-pay | Admitting: *Deleted

## 2018-10-27 VITALS — BP 143/72 | HR 70 | Temp 99.1°F | Resp 16 | Wt 213.0 lb

## 2018-10-27 DIAGNOSIS — I10 Essential (primary) hypertension: Secondary | ICD-10-CM | POA: Insufficient documentation

## 2018-10-27 DIAGNOSIS — Z79899 Other long term (current) drug therapy: Secondary | ICD-10-CM | POA: Diagnosis not present

## 2018-10-27 DIAGNOSIS — Z85528 Personal history of other malignant neoplasm of kidney: Secondary | ICD-10-CM

## 2018-10-27 DIAGNOSIS — C73 Malignant neoplasm of thyroid gland: Secondary | ICD-10-CM | POA: Diagnosis not present

## 2018-10-27 DIAGNOSIS — Z90710 Acquired absence of both cervix and uterus: Secondary | ICD-10-CM | POA: Diagnosis not present

## 2018-10-27 DIAGNOSIS — Z905 Acquired absence of kidney: Secondary | ICD-10-CM | POA: Insufficient documentation

## 2018-10-27 DIAGNOSIS — C641 Malignant neoplasm of right kidney, except renal pelvis: Secondary | ICD-10-CM | POA: Insufficient documentation

## 2018-10-27 DIAGNOSIS — E890 Postprocedural hypothyroidism: Secondary | ICD-10-CM | POA: Diagnosis not present

## 2018-10-27 NOTE — Progress Notes (Signed)
Patient notified that there were no findings of any solid organ lesions on her recent MRI according to addendum added by radiologist.

## 2018-10-27 NOTE — Patient Instructions (Signed)
[facility] at [facility] Discharge Instructions     Thank you for choosing [facility] at [facility] to provide your oncology and hematology care.  To afford each patient quality time with our provider, please arrive at least 15 minutes before your scheduled appointment time.   If you have a lab appointment with the Cancer Center please come in thru the  Main Entrance and check in at the main information desk  You need to re-schedule your appointment should you arrive 10 or more minutes late.  We strive to give you quality time with our providers, and arriving late affects you and other patients whose appointments are after yours.  Also, if you no show three or more times for appointments you may be dismissed from the clinic at the providers discretion.     Again, thank you for choosing [facility].  Our hope is that these requests will decrease the amount of time that you wait before being seen by our physicians.       _____________________________________________________________  Should you have questions after your visit to [facility], please contact our office at [phone] between the hours of 8:00 a.m. and 4:30 p.m.  Voicemails left after 4:00 p.m. will not be returned until the following business day.  For prescription refill requests, have your pharmacy contact our office and allow 72 hours.    Cancer Center Support Programs:   > Cancer Support Group  2nd Tuesday of the month 1pm-2pm, Journey Room    

## 2018-10-27 NOTE — Progress Notes (Signed)
Rebecca Scott, [place] [id number]   CLINIC:  Medical Oncology/Hematology  PCP:  Janora Norlander, DO [place] [address] [phone]   REASON FOR VISIT: Follow-up for right kidney cancer and new diagnosis of thyroid cancer  CURRENT THERAPY: surveillance    INTERVAL HISTORY:  Rebecca Scott 63 y.o. female returns for routine follow-up for right kidney cancer and new diagnosis of thyroid cancer. She is here today with her friend. She recently had back surgery on 09/26/2018. She had a disk pressing on her spine she lost feeling in her right leg and it is numb. Some of the feeling has returned but not all. She is doing well and walking with a walker at this time for stability. Denies any nausea, vomiting, or diarrhea. Denies any new pains. Had not noticed any recent bleeding such as epistaxis, hematuria or hematochezia. Denies recent chest pain on exertion, shortness of breath on minimal exertion, pre-syncopal episodes, or palpitations. Denies any numbness or tingling in hands or feet. Denies any recent fevers, infections, or recent hospitalizations. Patient reports appetite at 100% and energy level at 100%. She is eating well and maintaining her weight at this time.     REVIEW OF SYSTEMS:  Review of Systems  Cardiovascular: Positive for leg swelling.  Gastrointestinal: Positive for constipation and diarrhea.  Neurological: Positive for numbness.  All other systems reviewed and are negative.    PAST MEDICAL/SURGICAL HISTORY:  Past Medical History:  Diagnosis Date  . Anemia   . Anxiety   . Arthritis   . Asthma    seasonal asthma rarely   . Chronic kidney disease    renal mass right kidney   . CPAP (continuous positive airway pressure) dependence   . Diverticulitis   . GERD (gastroesophageal reflux disease)   . Hemorrhoids   . History of kidney stones   . HTN (hypertension) 07/29/2014  . Hyperlipemia   . Hypertension   .  Migraine    no migraines now, will have stress headaches  . Multinodular goiter 08/21/2017   Last Assessment & Plan:  She will need an f/u u/s of thyroid to reassess nodules. Repeat in 07/2018 Nodules are too small for biopsy now.  . 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 Papillary thyroid carcinoma ([place]) 03/10/2018  . PONV (postoperative nausea and vomiting)    patient reports being "slower to wake than average "   . Pre-diabetes    "ive been told i'm borderline"   . Sleep apnea    CPAP use    Past Surgical History:  Procedure Laterality Date  . ABDOMINAL HYSTERECTOMY    . APPENDECTOMY  1978  . BIOPSY  07/08/2017   Procedure: BIOPSY;  Surgeon: Rogene Houston, MD;  [facility];  Service: Endoscopy;;  gastric  . CHOLECYSTECTOMY  2005  . COLONOSCOPY WITH PROPOFOL N/A 10/28/2017   Procedure: COLONOSCOPY WITH PROPOFOL;  Surgeon: Rogene Houston, MD;  [facility];  Service: Endoscopy;  Laterality: N/A;  7:30  . ESOPHAGOGASTRODUODENOSCOPY N/A 07/08/2017   Procedure: ESOPHAGOGASTRODUODENOSCOPY (EGD);  Surgeon: Rogene Houston, MD;  [facility];  Service: Endoscopy;  Laterality: N/A;  220  . LUMBAR LAMINECTOMY/DECOMPRESSION MICRODISCECTOMY N/A 09/26/2018   Procedure: L4-5 MICRODISCECTOMY;  Surgeon: [name], MD;  Location: [facility];  Service: Orthopedics;  Laterality: N/A;  . NASAL SEPTUM SURGERY    . ROBOTIC ASSITED PARTIAL  NEPHRECTOMY Right 05/17/2017   Procedure: XI ROBOTIC ASSITED PARTIAL NEPHRECTOMY;  Surgeon: Alexis Frock, MD;  [facility];  Service: Urology;  Laterality: Right;  . THYROIDECTOMY N/A 03/10/2018   Procedure: TOTAL THYROIDECTOMY;  Surgeon: Aviva Signs, MD;  Location[facility];  Service: General;  Laterality: N/A;     SOCIAL HISTORY:  Social History   Socioeconomic History  . Marital status: Widowed    Spouse name: Not on file  . Number of children: Not on file  . Years of  education: Not on file  . Highest education level: Not on file  Occupational History  . Not on file  Social Needs  . Financial resource strain: Not on file  . Food insecurity:    Worry: Not on file    Inability: Not on file  . Transportation needs:    Medical: Not on file    Non-medical: Not on file  Tobacco Use  . Smoking status: Never Smoker  . Smokeless tobacco: Never Used  Substance and Sexual Activity  . Alcohol use: Yes    Comment: rarely   . Drug use: No  . Sexual activity: Not Currently  Lifestyle  . Physical activity:    Days per week: Not on file    Minutes per session: Not on file  . Stress: Not on file  Relationships  . Social connections:    Talks on phone: Not on file    Gets together: Not on file    Attends religious service: Not on file    Active member of club or organization: Not on file    Attends meetings of clubs or organizations: Not on file    Relationship status: Not on file  . Intimate partner violence:    Fear of current or ex partner: Not on file    Emotionally abused: Not on file    Physically abused: Not on file    Forced sexual activity: Not on file  Other Topics Concern  . Not on file  Social History Narrative  . Not on file    FAMILY HISTORY:  Family History  Problem Relation Age of Onset  . COPD Mother   . Diabetes Mother   . Hypertension Mother   . High Cholesterol Mother   . Liver cancer Mother   . COPD Sister   . Hypertension Brother   . High Cholesterol Brother   . Hypertension Brother   . Asthma Son     CURRENT MEDICATIONS:  Outpatient Encounter Medications as of 10/27/2018  Medication Sig Note  . dicyclomine (BENTYL) 10 MG capsule Take 2 capsules (20 mg total) by mouth 2 (two) times daily as needed for spasms. (Patient taking differently: Take 10-20 mg by mouth See admin instructions. Take 1 capsule (10 mg) by mouth in the morning, take 2 capsules (20 mg) by mouth at noon, & 1 capsule (10 mg) by mouth at bedtime.)     . DULoxetine (CYMBALTA) 60 MG capsule TAKE 1 CAPSULE BY MOUTH ONCE DAILY.   . hydrochlorothiazide (HYDRODIURIL) 25 MG tablet Take 1 tablet (25 mg total) by mouth every other day. (Patient taking differently: Take 25 mg by mouth every other day. In the morning.)   . HYDROcodone-acetaminophen (NORCO/VICODIN) 5-325 MG tablet Take 1 tablet by mouth every 6 (six) hours as needed for moderate pain.   Marland Kitchen levothyroxine (SYNTHROID) 100 MCG tablet Take 1 tablet (100 mcg total) by mouth daily before breakfast. 09/23/2018: 0700  . losartan (COZAAR) 100 MG tablet Take 1  tablet (100 mg total) by mouth daily.   . methocarbamol (ROBAXIN) 500 MG tablet Take 1 tablet (500 mg total) by mouth every 6 (six) hours as needed for muscle spasms.   . metoprolol tartrate (LOPRESSOR) 25 MG tablet Take 0.5 tablets (12.5 mg total) by mouth at bedtime.   . pantoprazole (PROTONIX) 40 MG tablet Take 1 tablet (40 mg total) by mouth daily before breakfast.   . potassium chloride SA (K-DUR,KLOR-CON) 20 MEQ tablet Take 1 tablet (20 mEq total) by mouth daily.   . [DISCONTINUED] diphenhydrAMINE (BENADRYL) 50 MG tablet Take 1 tablet (50 mg total) by mouth once for 1 dose. Take 1 hour before your procedure time 09/23/2018: Before procedure.  . [DISCONTINUED] HYDROcodone-acetaminophen (NORCO/VICODIN) 5-325 MG tablet Take 1 tablet by mouth every 6 (six) hours as needed for moderate pain.   . [DISCONTINUED] ondansetron (ZOFRAN ODT) 4 MG disintegrating tablet Take 1 tablet (4 mg total) every 8 (eight) hours as needed by mouth for nausea or vomiting. 4mg  ODT q4 hours prn nausea/vomit 07/22/2018: Patient rarely takes  . [DISCONTINUED] predniSONE (DELTASONE) 50 MG tablet Take 1 tablet (50mg ) 13 hours prior to scan. Take 1 tablet (50mg ) 7 hours prior to scan. Then take 1 tablet( 50mg ) 1 hour prior to scan.   . clonazePAM (KLONOPIN) 0.5 MG tablet Take 0.5 mg by mouth 2 (two) times daily as needed for anxiety.    . diphenoxylate-atropine (LOMOTIL)  2.5-0.025 MG tablet TAKE 1 TABLET 3 TIMES DAILY ON SCHEDULE (Patient not taking: No sig reported) 09/20/2018: Hold until needed  . [DISCONTINUED] diphenhydrAMINE (BENADRYL) 50 MG tablet Take 1 tablet (50 mg total) by mouth once for 1 dose. Please take 1 hour prior to your scan    No facility-administered encounter medications on file as of 10/27/2018.     ALLERGIES:  Allergies  Allergen Reactions  . Mango Flavor Swelling and Rash    Tongue swelling, feels like throat is closing in on her   . Iodinated Diagnostic Agents Hives and Itching  . Isovue [Iopamidol] Hives and Itching    And hives   . Nitrofurantoin Hives  . Other Hives, Itching, Rash and Other (See Comments)    Use Paper tape ONLY  . Sulfa Antibiotics Hives  . Adhesive [Tape] Rash and Other (See Comments)    Use Paper tape ONLY     PHYSICAL EXAM:  ECOG Performance status: 1  Vitals:   10/27/18 0900  BP: (!) 143/72  Pulse: 70  Resp: 16  Temp: 99.1 F (37.3 C)  SpO2: 98%   Filed Weights   10/27/18 0900  Weight: 213 lb (96.6 kg)    Physical Exam Constitutional:      Appearance: Normal appearance. She is normal weight.  Cardiovascular:     Rate and Rhythm: Normal rate and regular rhythm.     Heart sounds: Normal heart sounds.  Pulmonary:     Effort: Pulmonary effort is normal.     Breath sounds: Normal breath sounds.  Abdominal:     General: Abdomen is flat.     Palpations: Abdomen is soft.  Musculoskeletal: Normal range of motion.     Comments: With walker  Skin:    General: Skin is warm and dry.  Neurological:     Mental Status: She is alert and oriented to person, place, and time. Mental status is at baseline.  Psychiatric:        Mood and Affect: Mood normal.        Behavior: Behavior normal.  Thought Content: Thought content normal.        Judgment: Judgment normal.      LABORATORY DATA:  I have reviewed the labs as listed.  CBC    Component Value Date/Time   WBC 8.9 09/24/2018  1059   RBC 4.14 09/24/2018 1059   HGB 11.6 (L) 09/24/2018 1059   HGB 11.7 09/08/2018 1235   HCT 37.6 09/24/2018 1059   HCT 35.9 09/08/2018 1235   PLT 153 09/24/2018 1059   PLT 146 (L) 09/08/2018 1235   MCV 90.8 09/24/2018 1059   MCV 89 09/08/2018 1235   MCH 28.0 09/24/2018 1059   MCHC 30.9 09/24/2018 1059   RDW 14.6 09/24/2018 1059   RDW 14.2 09/08/2018 1235   LYMPHSABS 2.3 09/24/2018 1059   MONOABS 0.9 09/24/2018 1059   EOSABS 0.2 09/24/2018 1059   BASOSABS 0.1 09/24/2018 1059   CMP Latest Ref Rng & Units 09/24/2018 09/20/2018 09/08/2018  Glucose 70 - 99 mg/dL 104(H) 155(H) 103(H)  BUN 8 - 23 mg/dL 11 12 7(L)  Creatinine 0.44 - 1.00 mg/dL 0.68 0.60 0.74  Sodium 135 - 145 mmol/L 136 140 144  Potassium 3.5 - 5.1 mmol/L 3.1(L) 3.9 3.8  Chloride 98 - 111 mmol/L 102 109 106  CO2 22 - 32 mmol/L 26 - 21  Calcium 8.9 - 10.3 mg/dL 8.8(L) - 9.5  Total Protein 6.5 - 8.1 g/dL 6.5 - 6.8  Total Bilirubin 0.3 - 1.2 mg/dL 0.5 - 0.3  Alkaline Phos 38 - 126 U/L 67 - 98  AST 15 - 41 U/L 20 - 22  ALT 0 - 44 U/L 22 - 25       DIAGNOSTIC IMAGING:  I have independently reviewed the scans and discussed with the patient.   I have reviewed Francene Finders, NP's note and agree with the documentation.  I personally performed a face-to-face visit, made revisions and my assessment and plan is as follows.    ASSESSMENT & PLAN:   History of renal cell carcinoma 1.  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 -MRI of the lumbar spine on 09/20/2018 showed disc extrusion at L4-L5.  Subcentimeter cystic changes present in the right kidney.  Other solid organ  lesions are present. -She reportedly underwent microdiscectomy for disc prolapse.  She does report some numbness in the right leg. -CT CAP on 10/24/2018 shows no evidence of recurrent or metastatic disease.  Status post right upper pole partial nephrectomy.  Status post thyroidectomy. -Surveillance visits with history and physical exam every 6 months with CT abdomen and pelvis once a year. -We also reviewed her blood work from 09/24/2018 which was grossly within normal limits.  She will come back in 6 months for follow-up.  2.  Chronic pain syndrome:  -She has pain on and off in the right mid quadrant, which radiates to the back. -She took gabapentin in the past which helped, but discontinued secondary to ankle swelling.   3.  Thyroid cancer: - She underwent thyroidectomy and radio active iodine treatment.  She is on Synthroid and follows up with Dr. Dorris Fetch.  4.  Family history: -She reports her brother recently diagnosed with kidney cancer. -  Mother had biliary cancer and a maternal aunt had melanoma.  I will make a referral to genetic counselor.      Orders placed this encounter:  Orders Placed This Encounter  Procedures  . TSH  . Lactate dehydrogenase  . Magnesium  . CBC with Differential/Platelet  . Comprehensive metabolic panel      Derek Jack, MD [place] [phone]

## 2018-10-27 NOTE — Assessment & Plan Note (Addendum)
1.  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 -MRI of the lumbar spine on 09/20/2018 showed disc extrusion at L4-L5.  Subcentimeter cystic changes present in the right kidney.  Other solid organ lesions are present. -She reportedly underwent microdiscectomy for disc prolapse.  She does report some numbness in the right leg. -CT CAP on 10/24/2018 shows no evidence of recurrent or metastatic disease.  Status post right upper pole partial nephrectomy.  Status post thyroidectomy. -Surveillance visits with history and physical exam every 6 months with CT abdomen and pelvis once a year. -We also reviewed her blood work from 09/24/2018 which was grossly within normal limits.  She will come back in 6 months for follow-up.  2.  Chronic pain syndrome:  -She has pain on and off in the right mid quadrant, which radiates to the back. -She took gabapentin in the past which helped, but discontinued secondary to ankle swelling.   3.  Thyroid cancer: - She underwent thyroidectomy and radio active iodine treatment.  She is on Synthroid and follows up with Dr. Dorris Fetch.  4.  Family history: -She reports her brother recently diagnosed with kidney cancer. -Mother had biliary cancer and a maternal aunt had melanoma.  I will make a referral to genetic counselor.

## 2018-11-05 ENCOUNTER — Other Ambulatory Visit: Payer: Self-pay | Admitting: Family Medicine

## 2018-11-13 ENCOUNTER — Encounter (INDEPENDENT_AMBULATORY_CARE_PROVIDER_SITE_OTHER): Payer: Self-pay | Admitting: Orthopaedic Surgery

## 2018-11-13 ENCOUNTER — Ambulatory Visit (INDEPENDENT_AMBULATORY_CARE_PROVIDER_SITE_OTHER): Payer: 59 | Admitting: Orthopaedic Surgery

## 2018-11-13 VITALS — BP 135/91 | HR 76 | Ht 63.0 in | Wt 231.0 lb

## 2018-11-13 DIAGNOSIS — Z9889 Other specified postprocedural states: Secondary | ICD-10-CM

## 2018-11-13 NOTE — Progress Notes (Signed)
Post-Op Visit Note   Patient: Rebecca Scott           Date of Birth: 12/16/1955           MRN: [id number] Visit Date: 11/13/2018 PCP: Janora Norlander, DO   Assessment & Plan:  Chief Complaint:  Chief Complaint  Patient presents with  . Lower Back - Follow-up    09/26/2018 L4-5 Microdiscectomy   Visit Diagnoses:  1. S/P lumbar microdiscectomy     Plan: Patient current post microdiscectomy L4-5 right with removal of free fragment with a foot drop on 09/26/2018 she is gotten significant improvement in her anterior tib strength and still has slight weakness.  She still has some numbness on the dorsum of her foot as expected.  She was recently diagnosed with the flu on 11/10/2018.  She will continue with her ambulation on a daily basis and can begin sitting more starting at 6 weeks.  I plan to check her back again on an as-needed basis.  Follow-Up Instructions: Return if symptoms worsen or fail to improve.   Orders:  No orders of the defined types were placed in this encounter.  No orders of the defined types were placed in this encounter.   Imaging: No results found.  PMFS History: Patient Active Problem List   Diagnosis Date Noted  . S/P lumbar microdiscectomy 10/09/2018  . Right foot drop 09/23/2018  . Stress incontinence in female 09/16/2018  . Degenerative disc disease at L5-S1 level 06/06/2018  . Malignant neoplasm of thyroid gland ([place]) 05/27/2018  . Postsurgical hypothyroidism 05/27/2018  . Hypokalemia 04/20/2018  . Diarrhea 04/20/2018  . Hx of papillary thyroid carcinoma 03/26/2018  . S/P total thyroidectomy 03/10/2018  . Obstructive sleep apnea 03/05/2018  . Essential hypertension, benign 02/18/2018  . Aortic atherosclerosis ([place]) 01/24/2018  . History of renal cell carcinoma 12/03/2017  . Reactive depression 10/22/2017  . Prediabetes 08/21/2017  . Gastroesophageal reflux disease without esophagitis 07/05/2017  . Hyperlipidemia 07/29/2014  . Syncope  07/29/2014  . Mixed stress and urge urinary incontinence 01/14/2014  . Recurrent UTI 01/14/2014  . Vaginal atrophy 01/14/2014  . Benign lesion of eyelid 02/07/2012   Past Medical History:  Diagnosis Date  . Anemia   . Anxiety   . Arthritis   . Asthma    seasonal asthma rarely   . Chronic kidney disease    renal mass right kidney   . CPAP (continuous positive airway pressure) dependence   . Diverticulitis   . GERD (gastroesophageal reflux disease)   . Hemorrhoids   . History of kidney stones   . HTN (hypertension) 07/29/2014  . Hyperlipemia   . Hypertension   . Migraine    no migraines now, will have stress headaches  . Multinodular goiter 08/21/2017   Last Assessment & Plan:  She will need an f/u u/s of thyroid to reassess nodules. Repeat in 07/2018 Nodules are too small for biopsy now.  . Papillary renal cell carcinoma ([place]) 05/2017   s/p partial nephrectomy, right  . Papillary thyroid carcinoma ([place]) 03/10/2018  . PONV (postoperative nausea and vomiting)    patient reports being "slower to wake than average "   . Pre-diabetes    "ive been told i'm borderline"   . Sleep apnea    CPAP use     Family History  Problem Relation Age of Onset  . COPD Mother   . Diabetes Mother   . Hypertension Mother   . High Cholesterol Mother   .  Liver cancer Mother   . COPD Sister   . Hypertension Brother   . High Cholesterol Brother   . Hypertension Brother   . Asthma Son     Past Surgical History:  Procedure Laterality Date  . ABDOMINAL HYSTERECTOMY    . APPENDECTOMY  1978  . BIOPSY  07/08/2017   Procedure: BIOPSY;  Surgeon: Rogene Houston, MD;  [facility];  Service: Endoscopy;;  gastric  . CHOLECYSTECTOMY  2005  . COLONOSCOPY WITH PROPOFOL N/A 10/28/2017   Procedure: COLONOSCOPY WITH PROPOFOL;  Surgeon: Rogene Houston, MD;  [facility];  Service: Endoscopy;  Laterality: N/A;  7:30  . ESOPHAGOGASTRODUODENOSCOPY N/A 07/08/2017   Procedure:  ESOPHAGOGASTRODUODENOSCOPY (EGD);  Surgeon: Rogene Houston, MD;  [facility];  Service: Endoscopy;  Laterality: N/A;  220  . LUMBAR LAMINECTOMY/DECOMPRESSION MICRODISCECTOMY N/A 09/26/2018   Procedure: L4-5 MICRODISCECTOMY;  Surgeon: [name], MD;  Location: [place];  Service: Orthopedics;  Laterality: N/A;  . NASAL SEPTUM SURGERY    . ROBOTIC ASSITED PARTIAL NEPHRECTOMY Right 05/17/2017   Procedure: XI ROBOTIC ASSITED PARTIAL NEPHRECTOMY;  Surgeon: Alexis Frock, MD;  [facility];  Service: Urology;  Laterality: Right;  . THYROIDECTOMY N/A 03/10/2018   Procedure: TOTAL THYROIDECTOMY;  Surgeon: Aviva Signs, MD;  Location: [facility];  Service: General;  Laterality: N/A;   Social History   Occupational History  . Not on file  Tobacco Use  . Smoking status: Never Smoker  . Smokeless tobacco: Never Used  Substance and Sexual Activity  . Alcohol use: Yes    Comment: rarely   . Drug use: No  . Sexual activity: Not Currently

## 2018-11-20 ENCOUNTER — Encounter (HOSPITAL_COMMUNITY): Payer: Self-pay | Admitting: Genetic Counselor

## 2018-11-20 ENCOUNTER — Other Ambulatory Visit: Payer: Self-pay

## 2018-11-20 ENCOUNTER — Other Ambulatory Visit (HOSPITAL_COMMUNITY): Payer: 59

## 2018-11-20 ENCOUNTER — Inpatient Hospital Stay (HOSPITAL_COMMUNITY): Payer: 59 | Attending: Hematology | Admitting: Genetic Counselor

## 2018-11-20 DIAGNOSIS — Z85528 Personal history of other malignant neoplasm of kidney: Secondary | ICD-10-CM | POA: Diagnosis not present

## 2018-11-20 DIAGNOSIS — Z800 Family history of malignant neoplasm of digestive organs: Secondary | ICD-10-CM | POA: Insufficient documentation

## 2018-11-20 DIAGNOSIS — Z8051 Family history of malignant neoplasm of kidney: Secondary | ICD-10-CM | POA: Diagnosis not present

## 2018-11-20 DIAGNOSIS — Z85850 Personal history of malignant neoplasm of thyroid: Secondary | ICD-10-CM

## 2018-11-20 DIAGNOSIS — Z808 Family history of malignant neoplasm of other organs or systems: Secondary | ICD-10-CM | POA: Diagnosis not present

## 2018-11-20 DIAGNOSIS — Z1379 Encounter for other screening for genetic and chromosomal anomalies: Secondary | ICD-10-CM

## 2018-11-20 NOTE — Progress Notes (Signed)
REFERRING PROVIDER: Derek Jack, MD [address] [place], [place] [address]  PRIMARY PROVIDER:  Janora Norlander, DO  PRIMARY REASON FOR VISIT:  1. History of renal cell carcinoma   2. Family history of melanoma   3. Family history of colon cancer   4. Family history of kidney cancer   5. Family history of cancer of extrahepatic bile ducts   6. Hx of papillary thyroid carcinoma      HISTORY OF PRESENT ILLNESS:   Rebecca Scott, a 63 y.o. female, was seen for a [facility] cancer genetics consultation at the request of Dr. Delton Coombes due to a personal and family history of cancer.  Rebecca Scott presents to clinic today to discuss the possibility of a hereditary predisposition to cancer, genetic testing, and to further clarify her future cancer risks, as well as potential cancer risks for family members.   In 2018, at the age of 16, Rebecca Scott was diagnosed with papillary cancer of the right cancer. The treatment plan was a partial right nephrectomy.  In 2019, Rebecca Scott was diagnosed with papillary cancer of the thyroid.       CANCER HISTORY:   No history exists.     RISK FACTORS:  Menarche was at age 75.  First live birth at age 76.  OCP use for approximately 5-6 years.  Ovaries intact: no.  Hysterectomy: yes.  Menopausal status: postmenopausal.  HRT use: 15 years. Colonoscopy: yes; several polyps, is on a 5 year schedule. Mammogram within the last year: yes. Number of breast biopsies: 0. Up to date with pelvic exams: yes. Any excessive radiation exposure in the past: no  Past Medical History:  Diagnosis Date  . Anemia   . Anxiety   . Arthritis   . Asthma    seasonal asthma rarely   . Chronic kidney disease    renal mass right kidney   . CPAP (continuous positive airway pressure) dependence   . Diverticulitis   . Family history of cancer of extrahepatic bile ducts   . Family history of colon cancer   . Family history of kidney cancer   . Family history of  melanoma   . GERD (gastroesophageal reflux disease)   . Hemorrhoids   . History of kidney stones   . HTN (hypertension) 07/29/2014  . Hyperlipemia   . Hypertension   . Migraine    no migraines now, will have stress headaches  . Multinodular goiter 08/21/2017   Last Assessment & Plan:  She will need an f/u u/s of thyroid to reassess nodules. Repeat in 07/2018 Nodules are too small for biopsy now.  . Papillary renal cell carcinoma ([place]) 05/2017   s/p partial nephrectomy, right  . Papillary thyroid carcinoma ([place]) 03/10/2018  . PONV (postoperative nausea and vomiting)    patient reports being "slower to wake than average "   . Pre-diabetes    "ive been told i'm borderline"   . Sleep apnea    CPAP use     Past Surgical History:  Procedure Laterality Date  . ABDOMINAL HYSTERECTOMY    . APPENDECTOMY  1978  . BIOPSY  07/08/2017   Procedure: BIOPSY;  Surgeon: Rogene Houston, MD;  [facility];  Service: Endoscopy;;  gastric  . CHOLECYSTECTOMY  2005  . COLONOSCOPY WITH PROPOFOL N/A 10/28/2017   Procedure: COLONOSCOPY WITH PROPOFOL;  Surgeon: Rogene Houston, MD;  [facility];  Service: Endoscopy;  Laterality: N/A;  7:30  . ESOPHAGOGASTRODUODENOSCOPY N/A  07/08/2017   Procedure: ESOPHAGOGASTRODUODENOSCOPY (EGD);  Surgeon: Rogene Houston, MD;  [facility];  Service: Endoscopy;  Laterality: N/A;  220  . LUMBAR LAMINECTOMY/DECOMPRESSION MICRODISCECTOMY N/A 09/26/2018   Procedure: L4-5 MICRODISCECTOMY;  Surgeon: [name], MD;  Location: [facility];  Service: Orthopedics;  Laterality: N/A;  . NASAL SEPTUM SURGERY    . ROBOTIC ASSITED PARTIAL NEPHRECTOMY Right 05/17/2017   Procedure: XI ROBOTIC ASSITED PARTIAL NEPHRECTOMY;  Surgeon: Alexis Frock, MD;  Location: [facility];  Service: Urology;  Laterality: Right;  . THYROIDECTOMY N/A 03/10/2018   Procedure: TOTAL THYROIDECTOMY;  Surgeon: Aviva Signs, MD;  Location: [facility];  Service: General;  Laterality: N/A;     Social History   Socioeconomic History  . Marital status: Widowed    Spouse name: Not on file  . Number of children: Not on file  . Years of education: Not on file  . Highest education level: Not on file  Occupational History  . Not on file  Social Needs  . Financial resource strain: Not on file  . Food insecurity:    Worry: Not on file    Inability: Not on file  . Transportation needs:    Medical: Not on file    Non-medical: Not on file  Tobacco Use  . Smoking status: Never Smoker  . Smokeless tobacco: Never Used  Substance and Sexual Activity  . Alcohol use: Yes    Comment: rarely   . Drug use: No  . Sexual activity: Not Currently  Lifestyle  . Physical activity:    Days per week: Not on file    Minutes per session: Not on file  . Stress: Not on file  Relationships  . Social connections:    Talks on phone: Not on file    Gets together: Not on file    Attends religious service: Not on file    Active member of club or organization: Not on file    Attends meetings of clubs or organizations: Not on file    Relationship status: Not on file  Other Topics Concern  . Not on file  Social History Narrative  . Not on file     FAMILY HISTORY:  We obtained a detailed, 4-generation family history.  Significant diagnoses are listed below: Family History  Problem Relation Age of Onset  . COPD Mother   . Diabetes Mother   . Hypertension Mother   . High Cholesterol Mother   . Liver cancer Mother        bile duct cancer  . COPD Sister   . Hypertension Brother   . High Cholesterol Brother   . Kidney cancer Brother 7  . Hypertension Brother   . Asthma Son   . Congenital heart disease Son        hypoplastic left heart syndrome  . Melanoma Maternal Aunt        dx in her 51s  . Brain cancer Maternal Grandmother        ? benign  . Heart disease Maternal Grandfather   . Colon cancer Other   . Lung disease Maternal Uncle     The patient had two full term  pregnancies, the first child died at 3 days due to hypoplastic left heart, and the second child is a son who is healthy at 66.  She has two full brothers and a maternal half sister.  One brother was recently diagnosed with kidney cancer.  Both parents are deceased.    The  patient did not grow up with her biological father.  He died in December 02, 2013, and she does not know why or his family history.  The patient's mother died of bile duct cancer.  She has a sister and two brothers.  The sister had melanoma in her 63's and one brother has lung disease.  The maternal grandparents are deceased.  The grandmother had a benign brain tumor, but died of something else.  Her mother had colon cancer and her father had lung cancer.    Rebecca Scott is unaware of previous family history of genetic testing for hereditary cancer risks. Patient's maternal ancestors are of [place] and Caucasian descent, and paternal ancestors are of [place] and Caucasian descent. There is no reported Ashkenazi Jewish ancestry. There is no known consanguinity.  GENETIC COUNSELING ASSESSMENT: Rebecca Scott is a 64 y.o. female with a personal and family history of cancer which is somewhat suggestive of a hereditary cancer syndrome and predisposition to cancer. We, therefore, discussed and recommended the following at today's visit.   DISCUSSION: We discussed that 5 - 7% of kidney is hereditary.The general population risk of developing kidney cancer is approximately 1.6%. Papillary renal cell carcinoma (PRCC) accounts for 10%-20% of all renal cell cancers.  A small but currently unknown number of these hereditary cases is due to hereditary papillary renal cell carcinoma [employer]) - some literature suggests it could be around 6%. This highly penetrant, rare, autosomal-dominant condition is associated with a predisposition to developing type 1 papillary renal cell carcinoma that is typically bilateral and multifocal. There are no other organ  systems or clinical features associated with this condition. [place] is an adult-onset condition with an average age of onset of 3 years.  There are other genes that can be associated with hereditary kidney cancer syndromes, including Lynch syndrome genes.  Although unlikely, the patient does have some cancers in the family that are associated with Lynch syndrome.    We reviewed the characteristics, features and inheritance patterns of hereditary cancer syndromes. We also discussed genetic testing, including the appropriate family members to test, the process of testing, insurance coverage and turn-around-time for results. We discussed the implications of a negative, positive and/or variant of uncertain significant result. We recommended Rebecca Scott pursue genetic testing for the multi-cancer gene panel. The Multi-Gene Panel offered by [facility] includes sequencing and/or deletion duplication testing of the following 84 genes: AIP, ALK, APC, ATM, AXIN2,BAP1,  BARD1, BLM, BMPR1A, BRCA1, BRCA2, BRIP1, CASR, CDC73, CDH1, CDK4, CDKN1B, CDKN1C, CDKN2A (p14ARF), CDKN2A (p16INK4a), CEBPA, CHEK2, CTNNA1, DICER1, DIS3L2, EGFR (c.2369C>T, p.Thr790Met variant only), EPCAM (Deletion/duplication testing only), FH, FLCN, GATA2, GPC3, GREM1 (Promoter region deletion/duplication testing only), HOXB13 (c.251G>A, p.Gly84Glu), HRAS, KIT, MAX, MEN1, MET, MITF (c.952G>A, p.Glu318Lys variant only), MLH1, MSH2, MSH3, MSH6, MUTYH, NBN, NF1, NF2, NTHL1, PALB2, PDGFRA, PHOX2B, PMS2, POLD1, POLE, POT1, PRKAR1A, PTCH1, PTEN, RAD50, RAD51C, RAD51D, RB1, RECQL4, RET, RUNX1, SDHAF2, SDHA (sequence changes only), SDHB, SDHC, SDHD, SMAD4, SMARCA4, SMARCB1, SMARCE1, STK11, SUFU, TERC, TERT, TMEM127, TP53, TSC1, TSC2, VHL, WRN and WT1.    Based on Rebecca Scott's personal and family history of cancer, she meets medical criteria for genetic testing. Despite that she meets criteria, she may still have an out of pocket cost. We discussed that if her out of  pocket cost for testing is over $100, the laboratory will call and confirm whether she wants to proceed with testing.  If the out of pocket cost of testing is less than $100 she will be billed  by the genetic testing laboratory.   PLAN: After considering the risks, benefits, and limitations, Rebecca Scott provided informed consent to pursue genetic testing and the blood sample was sent to [facility] for analysis of the common hereditary cancer panel. Results should be available within approximately 2-3 weeks' time, at which point they will be disclosed by telephone to Rebecca Scott, as will any additional recommendations warranted by these results. Rebecca Scott will receive a summary of her genetic counseling visit and a copy of her results once available. This information will also be available in Epic.   Lastly, we encouraged Rebecca Scott to remain in contact with cancer genetics annually so that we can continuously update the family history and inform her of any changes in cancer genetics and testing that may be of benefit for this family.   Rebecca Scott questions were answered to her satisfaction today. Our contact information was provided should additional questions or concerns arise. Thank you for the referral and allowing [place] to share in the care of your patient.   Karen P. Florene Glen, [place], [facility] Certified Genetic Counselor [email or web address] phone: [phone]  The patient was seen for a total of 45 minutes in face-to-face genetic counseling.  This patient was discussed with Drs. Magrinat, Lindi Adie and/or Burr Medico who agrees with the above.    _______________________________________________________________________ For Office Staff:  Number of people involved in session: 2 Was an Intern/ student involved with case: no

## 2018-11-21 ENCOUNTER — Encounter: Payer: Self-pay | Admitting: Genetic Counselor

## 2018-11-30 ENCOUNTER — Other Ambulatory Visit: Payer: Self-pay | Admitting: Family Medicine

## 2018-11-30 ENCOUNTER — Other Ambulatory Visit: Payer: Self-pay | Admitting: Cardiology

## 2018-12-04 ENCOUNTER — Telehealth: Payer: Self-pay | Admitting: Genetic Counselor

## 2018-12-04 ENCOUNTER — Encounter: Payer: Self-pay | Admitting: Genetic Counselor

## 2018-12-04 ENCOUNTER — Ambulatory Visit: Payer: Self-pay | Admitting: Genetic Counselor

## 2018-12-04 DIAGNOSIS — Z1379 Encounter for other screening for genetic and chromosomal anomalies: Secondary | ICD-10-CM

## 2018-12-04 NOTE — Telephone Encounter (Signed)
Revealed negative genetic testing.  Discussed that we do not know why she has kidney and thyroid cancer or why there is cancer in the family. It could be due to a different gene that we are not testing, or maybe our current technology may not be able to pick something up.  It will be important for her to keep in contact with genetics to keep up with whether additional testing may be needed.  Revealed the ATM VUS, and explained this is treated like a normal result and will not impact her medical management at this time.

## 2018-12-04 NOTE — Progress Notes (Signed)
HPI:  Ms. Bartram was previously seen in the [facility] due to a personal and family history of cancer and concerns regarding a hereditary predisposition to cancer. Please refer to our prior cancer genetics clinic note for more information regarding our discussion, assessment and recommendations, at the time. Ms. Sacca recent genetic test results were disclosed to her, as were recommendations warranted by these results. These results and recommendations are discussed in more detail below.  CANCER HISTORY:    Malignant neoplasm of thyroid gland ([place])   05/27/2018 Initial Diagnosis    Malignant neoplasm of thyroid gland ([place])    12/02/2018 Genetic Testing    ATM (Gain (Exons 62-63) copy number = 3 VUS identified on the multicancer panel.  The Multi-Gene Panel offered by [facility] includes sequencing and/or deletion duplication testing of the following 85 genes: AIP, ALK, APC, ATM, AXIN2,BAP1,  BARD1, BLM, BMPR1A, BRCA1, BRCA2, BRIP1, CASR, CDC73, CDH1, CDK4, CDKN1B, CDKN1C, CDKN2A (p14ARF), CDKN2A (p16INK4a), CEBPA, CHEK2, CTNNA1, DICER1, DIS3L2, EGFR (c.2369C>T, p.Thr790Met variant only), EPCAM (Deletion/duplication testing only), FH, FLCN, GATA2, GPC3, GREM1 (Promoter region deletion/duplication testing only), HOXB13 (c.251G>A, p.Gly84Glu), HRAS, KIT, MAX, MEN1, MET, MITF (c.952G>A, p.Glu318Lys variant only), MLH1, MSH2, MSH3, MSH6, MUTYH, NBN, NF1, NF2, NTHL1, PALB2, PDGFRA, PHOX2B, PMS2, POLD1, POLE, POT1, PRKAR1A, PTCH1, PTEN, RAD50, RAD51C, RAD51D, RB1, RECQL4, RET, RNF43, RUNX1, SDHAF2, SDHA (sequence changes only), SDHB, SDHC, SDHD, SMAD4, SMARCA4, SMARCB1, SMARCE1, STK11, SUFU, TERC, TERT, TMEM127, TP53, TSC1, TSC2, VHL, WRN and WT1.  The report date is December 02, 2018.     FAMILY HISTORY:  We obtained a detailed, 4-generation family history.  Significant diagnoses are listed below: Family History  Problem Relation Age of Onset   COPD Mother    Diabetes Mother     Hypertension Mother    High Cholesterol Mother    Liver cancer Mother        bile duct cancer   COPD Sister    Hypertension Brother    High Cholesterol Brother    Kidney cancer Brother 76   Hypertension Brother    Asthma Son    Congenital heart disease Son        hypoplastic left heart syndrome   Melanoma Maternal Aunt        dx in her 71s   Brain cancer Maternal Grandmother        ? benign   Heart disease Maternal Grandfather    Colon cancer Other    Lung disease Maternal Uncle    Cancer Cousin        ? lunc cancer; mother's mat first cousin    The patient had two full term pregnancies, the first child died at 3 days due to hypoplastic left heart, and the second child is a son who is healthy at 67.  She has two full brothers and a maternal half sister.  One brother was recently diagnosed with kidney cancer.  Both parents are deceased.    The patient did not grow up with her biological father.  He died in 15-Nov-2013, and she does not know why or his family history.  The patient's mother died of bile duct cancer.  She has a sister and two brothers.  The sister had melanoma in her 7's and one brother has lung disease.  The maternal grandparents are deceased.  The grandmother had a benign brain tumor, but died of something else.  Her mother had colon cancer and her father had lung cancer.    Ms.  Ballester is unaware of previous family history of genetic testing for hereditary cancer risks. Patient's maternal ancestors are of [place] and Caucasian descent, and paternal ancestors are of [place] and Caucasian descent. There is no reported Ashkenazi Jewish ancestry. There is no known consanguinity.    GENETIC TEST RESULTS: Genetic testing reported out on December 02, 2018 through the Multi-cancer panel found no pathogenic mutations. The Multi-Gene Panel offered by [facility] includes sequencing and/or deletion duplication testing of the following 85 genes: AIP, ALK, APC,  ATM, AXIN2,BAP1,  BARD1, BLM, BMPR1A, BRCA1, BRCA2, BRIP1, CASR, CDC73, CDH1, CDK4, CDKN1B, CDKN1C, CDKN2A (p14ARF), CDKN2A (p16INK4a), CEBPA, CHEK2, CTNNA1, DICER1, DIS3L2, EGFR (c.2369C>T, p.Thr790Met variant only), EPCAM (Deletion/duplication testing only), FH, FLCN, GATA2, GPC3, GREM1 (Promoter region deletion/duplication testing only), HOXB13 (c.251G>A, p.Gly84Glu), HRAS, KIT, MAX, MEN1, MET, MITF (c.952G>A, p.Glu318Lys variant only), MLH1, MSH2, MSH3, MSH6, MUTYH, NBN, NF1, NF2, NTHL1, PALB2, PDGFRA, PHOX2B, PMS2, POLD1, POLE, POT1, PRKAR1A, PTCH1, PTEN, RAD50, RAD51C, RAD51D, RB1, RECQL4, RET, RNF43, RUNX1, SDHAF2, SDHA (sequence changes only), SDHB, SDHC, SDHD, SMAD4, SMARCA4, SMARCB1, SMARCE1, STK11, SUFU, TERC, TERT, TMEM127, TP53, TSC1, TSC2, VHL, WRN and WT1.  The test report has been scanned into EPIC and is located under the Molecular Pathology section of the Results Review tab.  A portion of the result report is included below for reference.     We discussed with Ms. Eskin that because current genetic testing is not perfect, it is possible there may be a gene mutation in one of these genes that current testing cannot detect, but that chance is small.  We also discussed, that there could be another gene that has not yet been discovered, or that we have not yet tested, that is responsible for the cancer diagnoses in the family. It is also possible there is a hereditary cause for the cancer in the family that Ms. Frankum did not inherit and therefore was not identified in her testing.  Therefore, it is important to remain in touch with cancer genetics in the future so that we can continue to offer Ms. Matters the most up to date genetic testing.   Genetic testing did identify a variant of uncertain significance (VUS) was identified in the ATM gene called Gain (Exons 62-63) Copy number = 3.  At this time, it is unknown if this variant is associated with increased cancer risk or if this is a normal  finding, but most variants such as this get reclassified to being inconsequential. It should not be used to make medical management decisions. With time, we suspect the lab will determine the significance of this variant, if any. If we do learn more about it, we will try to contact Ms. Whitsell to discuss it further. However, it is important to stay in touch with [place] periodically and keep the address and phone number up to date.  ADDITIONAL GENETIC TESTING: We discussed with Ms. Witthuhn that her genetic testing was fairly extensive.  If there are genes identified to increase cancer risk that can be analyzed in the future, we would be happy to discuss and coordinate this testing at that time.    CANCER SCREENING RECOMMENDATIONS: Ms. Coppess test result is considered negative (normal).  This means that we have not identified a hereditary cause for her personal and family history of cancer at this time. Most cancers happen by chance and this negative test suggests that her cancer may fall into this category.    While reassuring, this does not definitively rule out  a hereditary predisposition to cancer. It is still possible that there could be genetic mutations that are undetectable by current technology. There could be genetic mutations in genes that have not been tested or identified to increase cancer risk.  Therefore, it is recommended she continue to follow the cancer management and screening guidelines provided by her oncology and primary healthcare provider.   An individual's cancer risk and medical management are not determined by genetic test results alone. Overall cancer risk assessment incorporates additional factors, including personal medical history, family history, and any available genetic information that may result in a personalized plan for cancer prevention and surveillance  RECOMMENDATIONS FOR FAMILY MEMBERS:  Individuals in this family might be at some increased risk of developing cancer,  over the general population risk, simply due to the family history of cancer.  We recommended women in this family have a yearly mammogram beginning at age 2, or 39 years younger than the earliest onset of cancer, an annual clinical breast exam, and perform monthly breast self-exams. Women in this family should also have a gynecological exam as recommended by their primary provider. All family members should have a colonoscopy by age 69.  FOLLOW-UP: Lastly, we discussed with Ms. Stang that cancer genetics is a rapidly advancing field and it is possible that new genetic tests will be appropriate for her and/or her family members in the future. We encouraged her to remain in contact with cancer genetics on an annual basis so we can update her personal and family histories and let her know of advances in cancer genetics that may benefit this family.   Our contact number was provided. Ms. Velasquez questions were answered to her satisfaction, and she knows she is welcome to call us at anytime with additional questions or concerns.   Roma Kayser, MS, [facility] Certified Genetic Counselor [email or web address]

## 2018-12-22 ENCOUNTER — Ambulatory Visit (INDEPENDENT_AMBULATORY_CARE_PROVIDER_SITE_OTHER): Payer: 59 | Admitting: Family Medicine

## 2018-12-22 ENCOUNTER — Encounter: Payer: Self-pay | Admitting: Family Medicine

## 2018-12-22 ENCOUNTER — Other Ambulatory Visit: Payer: Self-pay

## 2018-12-22 DIAGNOSIS — Z9889 Other specified postprocedural states: Secondary | ICD-10-CM | POA: Diagnosis not present

## 2018-12-22 DIAGNOSIS — Z9989 Dependence on other enabling machines and devices: Secondary | ICD-10-CM

## 2018-12-22 DIAGNOSIS — I10 Essential (primary) hypertension: Secondary | ICD-10-CM

## 2018-12-22 DIAGNOSIS — G4733 Obstructive sleep apnea (adult) (pediatric): Secondary | ICD-10-CM

## 2018-12-22 DIAGNOSIS — F329 Major depressive disorder, single episode, unspecified: Secondary | ICD-10-CM

## 2018-12-22 MED ORDER — DULOXETINE HCL 30 MG PO CPEP: ORAL_CAPSULE | ORAL | 0 refills | Status: DC

## 2018-12-22 NOTE — Progress Notes (Signed)
Telephone visit  Subjective: CC: OSA, HTN, low back pain, mood PCP: Janora Norlander, DO UXN:[id number] Rebecca Scott is a 63 y.o. female calls for telephone consult today. Patient provides verbal consent for consult held via phone.  Location of patient: home Location of provider: [facility] Others present for call: none  1.  Obstructive sleep apnea Patient reports that she has had obstructive sleep apnea for over 15 years now.  Her last CPAP titration was greater than 15 years ago.  She reports some poor sleep setting that she only gets about 3 to 4 hours of sleep per night and does not feel well rested in the morning.  She is wondering if she needs CPAP titration.  She does not want to take any medications to induce sleep this time.  2.  Mood/low back pain Patient reports she continues to have some mood swings but is still interested in coming off of the Cymbalta.  She wants to minimize the amount of medication that she is on presently.  She is currently taking 60 mg daily.  She continues to have musculoskeletal pain and had a microdiscectomy performed on the left in January.  That pain had improved significantly since then but recently she twisted her back and has had mild pain on the right.  3.  Hypertension Patient reports that she is compliant with losartan 100 mg daily, hydrochlorothiazide 25 mg q. OD and metoprolol q. at bedtime.  She typically has systolics of 202R but occasionally will have diastolics of greater than 90.  No chest pain, shortness of breath.  She is followed with Dr. Harl Bowie.  She reports maintaining a low-salt diet.   ROS: Per HPI  Allergies  Allergen Reactions  . Mango Flavor Swelling and Rash    Tongue swelling, feels like throat is closing in on her   . Iodinated Diagnostic Agents Hives and Itching  . Isovue [Iopamidol] Hives and Itching    And hives   . Nitrofurantoin Hives  . Other Hives, Itching, Rash and Other (See Comments)    Use Paper tape ONLY  . Sulfa  Antibiotics Hives  . Adhesive [Tape] Rash and Other (See Comments)    Use Paper tape ONLY   Past Medical History:  Diagnosis Date  . Anemia   . Anxiety   . Arthritis   . Asthma    seasonal asthma rarely   . Chronic kidney disease    renal mass right kidney   . CPAP (continuous positive airway pressure) dependence   . Diverticulitis   . Family history of cancer of extrahepatic bile ducts   . Family history of colon cancer   . Family history of kidney cancer   . Family history of melanoma   . GERD (gastroesophageal reflux disease)   . Hemorrhoids   . History of kidney stones   . HTN (hypertension) 07/29/2014  . Hyperlipemia   . Hypertension   . Migraine    no migraines now, will have stress headaches  . Multinodular goiter 08/21/2017   Last Assessment & Plan:  She will need an f/u u/s of thyroid to reassess nodules. Repeat in 07/2018 Nodules are too small for biopsy now.  . Papillary renal cell carcinoma ([place]) 05/2017   s/p partial nephrectomy, right  . Papillary thyroid carcinoma ([place]) 03/10/2018  . PONV (postoperative nausea and vomiting)    patient reports being "slower to wake than average "   . Pre-diabetes    "ive been told i'm borderline"   .  Sleep apnea    CPAP use     Current Outpatient Medications:  .  azithromycin (ZITHROMAX) 250 MG tablet, , Disp: , Rfl:  .  benzonatate (TESSALON) 100 MG capsule, Take by mouth., Disp: , Rfl:  .  clonazePAM (KLONOPIN) 0.5 MG tablet, Take 0.5 mg by mouth 2 (two) times daily as needed for anxiety. , Disp: , Rfl:  .  dicyclomine (BENTYL) 10 MG capsule, Take 2 capsules (20 mg total) by mouth 2 (two) times daily as needed for spasms. (Patient taking differently: Take 10-20 mg by mouth See admin instructions. Take 1 capsule (10 mg) by mouth in the morning, take 2 capsules (20 mg) by mouth at noon, & 1 capsule (10 mg) by mouth at bedtime.), Disp: 120 capsule, Rfl: 4 .  diphenoxylate-atropine (LOMOTIL) 2.5-0.025 MG tablet, TAKE 1 TABLET 3  TIMES DAILY ON SCHEDULE (Patient not taking: No sig reported), Disp: 44 tablet, Rfl: 0 .  DULoxetine (CYMBALTA) 60 MG capsule, TAKE 1 CAPSULE BY MOUTH ONCE DAILY., Disp: 30 capsule, Rfl: 2 .  hydrochlorothiazide (HYDRODIURIL) 25 MG tablet, TAKE 1 TABLET EVERY OTHER DAY., Disp: 15 tablet, Rfl: 3 .  HYDROcodone-acetaminophen (NORCO/VICODIN) 5-325 MG tablet, Take 1 tablet by mouth every 6 (six) hours as needed for moderate pain., Disp: 25 tablet, Rfl: 0 .  levothyroxine (SYNTHROID) 100 MCG tablet, Take 1 tablet (100 mcg total) by mouth daily before breakfast., Disp: 30 tablet, Rfl: 6 .  losartan (COZAAR) 100 MG tablet, TAKE 1 TABLET ONCE DAILY., Disp: 30 tablet, Rfl: 2 .  methocarbamol (ROBAXIN) 500 MG tablet, Take 1 tablet (500 mg total) by mouth every 6 (six) hours as needed for muscle spasms., Disp: 60 tablet, Rfl: 0 .  metoprolol tartrate (LOPRESSOR) 25 MG tablet, TAKE (1/2) TABLET AT BEDTIME., Disp: 15 tablet, Rfl: 3 .  oseltamivir (TAMIFLU) 75 MG capsule, , Disp: , Rfl:  .  pantoprazole (PROTONIX) 40 MG tablet, Take 1 tablet (40 mg total) by mouth daily before breakfast., Disp: 90 tablet, Rfl: 3 .  potassium chloride SA (K-DUR,KLOR-CON) 20 MEQ tablet, TAKE 1 TABLET TWICE DAILY FOR 3 DAYS THEN 1 TABLET DAILY., Disp: 30 tablet, Rfl: 5 .  rosuvastatin (CRESTOR) 5 MG tablet, TAKE 1 TABLET BY MOUTH ONCE DAILY., Disp: 30 tablet, Rfl: 3  Assessment/ Plan: 63 y.o. female   1. Obstructive sleep apnea on CPAP I referred her back to sleep studies for titration of her CPAP machine and new equipment.  She is aware that this appointment may not occur until summertime given ongoing COVID-19 outbreak - Ambulatory referral to Sleep Studies  2. Essential hypertension, benign Borderline control.  She does have some outliers in her diastolic pressures.  I have asked that she monitor these closely and record.  Pay attention to if this is occurring on days that she is not taking the hydrochlorothiazide.  May need  to consider daily hydrochlorothiazide versus addition of calcium channel blocker.  3. S/P lumbar microdiscectomy Overall doing well but had mild back pain over the last couple of days.  She is to continue monitor this closely.  4. Reactive depression Stable.  She wishes to titrate off of the Cymbalta.  I have sent in Cymbalta 30 mg daily for her to take for the next 2 weeks and then transition to 30 mg q. OD for 2 weeks then okay to stop.  Meds ordered this encounter  Medications  . DULoxetine (CYMBALTA) 30 MG capsule    Sig: Take 1 capsule (30 mg total) by  mouth daily for 14 days, THEN 1 capsule (30 mg total) every other day for 14 days.    Dispense:  21 capsule    Refill:  0     Start time: 9:52am End time: 10:09am  Total time spent on patient care (including telephone call/ virtual visit): 20 minutes  [place], [place] [phone]

## 2018-12-25 ENCOUNTER — Telehealth: Payer: Self-pay | Admitting: Neurology

## 2018-12-25 NOTE — Telephone Encounter (Signed)
Due to current COVID 19 pandemic, our office is severely reducing in office visits, in order to minimize the risk to our patients and healthcare providers.    Pt understands that although there may be some limitations with this type of visit, we will take all precautions to reduce any security or privacy concerns.  Pt understands that this will be treated like an in office visit and we will file with pt's insurance, and there may be a patient responsible charge related to this service.   Pt's email is [email or web address] Pt understands that the cisco webex software must be downloaded and operational on the device pt plans to use for the visit. Pt understands that the nurse will be calling to go over pt's chart.

## 2018-12-31 NOTE — Telephone Encounter (Signed)
I called pt. Pt's meds, allergies, and PMH were updated.  Pt has had a sleep study, last one in 2006. Pt has been on a cpap. Her current cpap is about 62 years old. Pt uses [employer].  Pt reports that she has had trouble sleeping recently. Pt does endorse snoring.  Epworth Sleepiness Scale 0= would never doze 1= slight chance of dozing 2= moderate chance of dozing 3= high chance of dozing  Sitting and reading: 1 Watching TV: 1 Sitting inactive in a public place (ex. Theater or meeting): 1 As a passenger in a car for an hour without a break: 1 Lying down to rest in the afternoon: 3 Sitting and talking to someone: 1 Sitting quietly after lunch (no alcohol): 2 In a car, while stopped in traffic: 1 Total: 11  FSS: 51  I called [facility] to see if I can get a cpap download. They will check and see if this is possible and then fax it to me.

## 2018-12-31 NOTE — Addendum Note (Signed)
Addended by: Lester [place] A on: 12/31/2018 09:54 AM   Modules accepted: Orders

## 2019-01-01 ENCOUNTER — Other Ambulatory Visit (INDEPENDENT_AMBULATORY_CARE_PROVIDER_SITE_OTHER): Payer: Self-pay | Admitting: Internal Medicine

## 2019-01-01 ENCOUNTER — Other Ambulatory Visit: Payer: Self-pay | Admitting: Cardiology

## 2019-01-05 NOTE — Telephone Encounter (Signed)
I called [facility]. Pt has a ResMed S-9 cpap that is not a wireless machine. She will need a chip download.

## 2019-01-06 ENCOUNTER — Ambulatory Visit (INDEPENDENT_AMBULATORY_CARE_PROVIDER_SITE_OTHER): Payer: 59 | Admitting: Neurology

## 2019-01-06 ENCOUNTER — Other Ambulatory Visit: Payer: Self-pay

## 2019-01-06 ENCOUNTER — Encounter: Payer: Self-pay | Admitting: Neurology

## 2019-01-06 DIAGNOSIS — G479 Sleep disorder, unspecified: Secondary | ICD-10-CM

## 2019-01-06 DIAGNOSIS — R519 Headache, unspecified: Secondary | ICD-10-CM

## 2019-01-06 DIAGNOSIS — R51 Headache: Secondary | ICD-10-CM

## 2019-01-06 DIAGNOSIS — G4733 Obstructive sleep apnea (adult) (pediatric): Secondary | ICD-10-CM | POA: Diagnosis not present

## 2019-01-06 DIAGNOSIS — G4719 Other hypersomnia: Secondary | ICD-10-CM

## 2019-01-06 DIAGNOSIS — Z6841 Body Mass Index (BMI) 40.0 and over, adult: Secondary | ICD-10-CM

## 2019-01-06 NOTE — Progress Notes (Signed)
Star Age, MD, PhD [facility] [address] [place], [place] [address]   Virtual Visit via Video Note on 01/06/2019:  I connected with [name] on 01/06/19 at  62:00 PM EDT by a video enabled telemedicine application and verified that I am speaking with the correct person using two identifiers.   I discussed the limitations of evaluation and management by telemedicine and the availability of in person appointments. The patient expressed understanding and agreed to proceed.  History of Present Illness: [name] is a 63 year old right-handed woman with an underlying medical history of prediabetes, papillary thyroid cancer, migraine headaches, hypertension, hyperlipidemia, history of kidney stones, reflux disease, diverticulitis, asthma, arthritis, anemia, anxiety, and obesity, with whom I am conducting a virtual, video based new patient visit via Webex in lieu of a face-to-face visit for evaluation of her sleep disorder, in particular, reevaluation of her prior diagnosis of obstructive sleep apnea. The patient is unaccompanied today and joins via cell phone from home. She is referred by Dr. Ronnie Doss and I reviewed her phone visit note from 12/22/18. She reports difficulty maintaining sleep, nonrestorative sleep, some residual sleep apnea type symptoms. She reports compliance with her CPAP. Her current machine is set to a pressure of 8 cm, she uses nasal pillows, DME company is [employer] in [place]. She received a new machine in 2015, her model is ResMed escape auto.  She was diagnosed with obstructive sleep apnea nearly 15 years ago, in 2006 and she has been on CPAP therapy. Her machine is about 62 years old, she is on her second machine. Prior sleep study results were reviewed from 05/21/2005. She had a split-night sleep study at the time. Her baseline RDI was 32 per hour, O2 nadir 82%. She had good control with a CPAP of 8 cm. A CPAP  compliance download is not possible today. Her Epworth sleepiness score is 11 out of 24, fatigue severity score is 51 out of 63. Her bedtime is between 10 and 11 and rise time between 8 and 10. She does not have night to night nocturia. She gained weight after her cancer surgeries. She had a partial right nephrectomy and a thyroidectomy. She was diagnosed with asthma some years ago and was also told that she had reduced tonsils as in a naturally shrunk away. She did not actually have a tonsillectomy. She is not aware of any family history of OSA. She lives alone, is widowed, has adult children. She has no pets in the household. She drinks caffeine limitation, 1 cup of coffee per day on average, she is a nonsmoker does not currently utilize any alcohol. She has a TV in the bedroom but does not typically watch it at night. She reports full compliance with her CPAP but wonders if the settings need to be changed. She is on disability, previously worked as an Product/process development scientist.   Her Past Medical History Is Significant For: Past Medical History:  Diagnosis Date  . Anemia   . Anxiety   . Arthritis   . Asthma    seasonal asthma rarely   . Chronic kidney disease    renal mass right kidney   . CPAP (continuous positive airway pressure) dependence   . Diverticulitis   . Family history of cancer of extrahepatic bile ducts   . Family history of colon cancer   . Family history of kidney cancer   . Family history of melanoma   .  GERD (gastroesophageal reflux disease)   . Hemorrhoids   . History of kidney stones   . HTN (hypertension) 07/29/2014  . Hyperlipemia   . Hypertension   . Migraine    no migraines now, will have stress headaches  . Multinodular goiter 08/21/2017   Last Assessment & Plan:  She will need an f/u u/s of thyroid to reassess nodules. Repeat in 07/2018 Nodules are too small for biopsy now.  . Papillary renal cell carcinoma ([place]) 05/2017   s/p partial  nephrectomy, right  . Papillary thyroid carcinoma ([place]) 03/10/2018  . PONV (postoperative nausea and vomiting)    patient reports being "slower to wake than average "   . Pre-diabetes    "ive been told i'm borderline"   . Sleep apnea    CPAP use     Her Past Surgical History Is Significant For: Past Surgical History:  Procedure Laterality Date  . ABDOMINAL HYSTERECTOMY    . APPENDECTOMY  1978  . BIOPSY  07/08/2017   Procedure: BIOPSY;  Surgeon: Rogene Houston, MD;  [facility]: [facility];  Service: Endoscopy;;  gastric  . CHOLECYSTECTOMY  2005  . COLONOSCOPY WITH PROPOFOL N/A 10/28/2017   Procedure: COLONOSCOPY WITH PROPOFOL;  Surgeon: Rogene Houston, MD;  [facility]: [facility];  Service: Endoscopy;  Laterality: N/A;  7:30  . ESOPHAGOGASTRODUODENOSCOPY N/A 07/08/2017   Procedure: ESOPHAGOGASTRODUODENOSCOPY (EGD);  Surgeon: Rogene Houston, MD;  [facility];  Service: Endoscopy;  Laterality: N/A;  220  . LUMBAR LAMINECTOMY/DECOMPRESSION MICRODISCECTOMY N/A 09/26/2018   Procedure: L4-5 MICRODISCECTOMY;  Surgeon: [name], MD;  Location: [facility];  Service: Orthopedics;  Laterality: N/A;  . NASAL SEPTUM SURGERY    . ROBOTIC ASSITED PARTIAL NEPHRECTOMY Right 05/17/2017   Procedure: XI ROBOTIC ASSITED PARTIAL NEPHRECTOMY;  Surgeon: Alexis Frock, MD;  Location: [facility];  Service: Urology;  Laterality: Right;  . THYROIDECTOMY N/A 03/10/2018   Procedure: TOTAL THYROIDECTOMY;  Surgeon: Aviva Signs, MD;  Location: [facility];  Service: General;  Laterality: N/A;    Her Family History Is Significant For: Family History  Problem Relation Age of Onset  . COPD Mother   . Diabetes Mother   . Hypertension Mother   . High Cholesterol Mother   . Liver cancer Mother        bile duct cancer  . COPD Sister   . Hypertension Brother   . High Cholesterol Brother   . Kidney cancer Brother 36  . Hypertension Brother   . Asthma Son   . Congenital heart disease Son        hypoplastic  left heart syndrome  . Melanoma Maternal Aunt        dx in her 57s  . Brain cancer Maternal Grandmother        ? benign  . Heart disease Maternal Grandfather   . Colon cancer Other   . Lung disease Maternal Uncle   . Cancer Cousin        ? lunc cancer; mother's mat first cousin    Her Social History Is Significant For: Social History   Socioeconomic History  . Marital status: Widowed    Spouse name: Not on file  . Number of children: Not on file  . Years of education: Not on file  . Highest education level: Not on file  Occupational History  . Not on file  Social Needs  . Financial resource strain: Not on file  . Food insecurity:    Worry:  Not on file    Inability: Not on file  . Transportation needs:    Medical: Not on file    Non-medical: Not on file  Tobacco Use  . Smoking status: Never Smoker  . Smokeless tobacco: Never Used  Substance and Sexual Activity  . Alcohol use: Yes    Comment: rarely   . Drug use: No  . Sexual activity: Not Currently  Lifestyle  . Physical activity:    Days per week: Not on file    Minutes per session: Not on file  . Stress: Not on file  Relationships  . Social connections:    Talks on phone: Not on file    Gets together: Not on file    Attends religious service: Not on file    Active member of club or organization: Not on file    Attends meetings of clubs or organizations: Not on file    Relationship status: Not on file  Other Topics Concern  . Not on file  Social History Narrative  . Not on file    Her Allergies Are:  Allergies  Allergen Reactions  . Mango Flavor Swelling and Rash    Tongue swelling, feels like throat is closing in on her   . Iodinated Diagnostic Agents Hives and Itching  . Isovue [Iopamidol] Hives and Itching    And hives   . Nitrofurantoin Hives  . Other Hives, Itching, Rash and Other (See Comments)    Use Paper tape ONLY  . Sulfa Antibiotics Hives  . Adhesive [Tape] Rash and Other (See Comments)     Use Paper tape ONLY  :   Her Current Medications Are:  Outpatient Encounter Medications as of 01/06/2019  Medication Sig  . clonazePAM (KLONOPIN) 0.5 MG tablet Take 0.5 mg by mouth 2 (two) times daily as needed for anxiety.   . dicyclomine (BENTYL) 10 MG capsule Take 1-2 capsules (10-20 mg total) by mouth See admin instructions. Take 1 capsule (10 mg) by mouth in the morning, take 2 capsules (20 mg) by mouth at noon, & 1 capsule (10 mg) by mouth at bedtime.  . DULoxetine (CYMBALTA) 30 MG capsule Take 1 capsule (30 mg total) by mouth daily for 14 days, THEN 1 capsule (30 mg total) every other day for 14 days.  . hydrochlorothiazide (HYDRODIURIL) 25 MG tablet TAKE 1 TABLET EVERY OTHER DAY.  Marland Kitchen levothyroxine (SYNTHROID) 100 MCG tablet Take 1 tablet (100 mcg total) by mouth daily before breakfast.  . losartan (COZAAR) 100 MG tablet TAKE 1 TABLET ONCE DAILY.  . methocarbamol (ROBAXIN) 500 MG tablet Take 1 tablet (500 mg total) by mouth every 6 (six) hours as needed for muscle spasms.  . metoprolol tartrate (LOPRESSOR) 25 MG tablet TAKE (1/2) TABLET AT BEDTIME.  . pantoprazole (PROTONIX) 40 MG tablet Take 1 tablet (40 mg total) by mouth daily before breakfast.  . potassium chloride SA (K-DUR,KLOR-CON) 20 MEQ tablet TAKE 1 TABLET TWICE DAILY FOR 3 DAYS THEN 1 TABLET DAILY.  . rosuvastatin (CRESTOR) 5 MG tablet TAKE 1 TABLET BY MOUTH ONCE DAILY.   No facility-administered encounter medications on file as of 01/06/2019.   :   Review of Systems:  Out of a complete 14 point review of systems, all are reviewed and negative with the exception of these symptoms as listed below:  Observations/Objective: The most recent viewable vital signs in the chart are from 11/13/2018: Blood pressure 135/91 with a pulse of 76, weight 231 pounds for a BMI of 40.92.  Her most recent weight at home by self report is 208 pounds about 4 days ago and neck circumference by self-report is 17-1/4 inches.on examination she  is pleasant, conversant, in no acute distress. Face is symmetric with normal facial animation. Speech is clear without dysarthria, hypophonia or voice tremor noted. She has normal language skills. Extraocular movements are preserved. Airway examination reveals a smaller airway entry, Mallampati class II. Tonsillar residual tissue notable at least on the right. She has a unremarkable anterior neck scar. Upper body coordination grossly intact. She is able to stand and walk without problems.  Assessment and Plan: [name] is a 63 year old right-handed woman with an underlying medical history of prediabetes, papillary thyroid cancer, migraine headaches, hypertension, hyperlipidemia, history of kidney stones, reflux disease, diverticulitis, asthma, arthritis, anemia, anxiety, and obesity, with whom I am conducting a virtual, video based new patient visit via Webex in lieu of a face-to-face visit for evaluation of an underlying organic sleep disorder, in particular, reevaluation of her prior diagnosis of obstructive sleep apnea. She reports full compliance with her CPAP machine but has not had a reevaluation and nearly 15 years. She also would qualify for a new an upgraded CPAP machine. She is commended for her treatment adherence with CPAP and is advised to proceed with retesting with a home sleep test. I discussed with the patient the diagnosis of OSA, its prognosis and treatment options. I explained in particular the risks and ramifications of untreated moderate to severe OSA, especially with respect to developing cardiovascular disease down the Road, including congestive heart failure, difficult to treat hypertension, cardiac arrhythmias, or stroke. Even type 2 diabetes has, in part, been linked to untreated OSA. Symptoms of untreated OSA may include daytime sleepiness, memory problems, mood irritability and mood disorder such as depression and anxiety, lack of energy, as well as recurrent headaches,  especially morning headaches. We talked about the importance of weight control. We talked about the importance of maintaining good sleep hygiene. I recommended the following at this time: home sleep test.  I explained the sleep test procedure to the patient and also outlined possible treatment options of OSA, including the use of a custom-made dental device (which would require a referral to a specialist dentist), upper airway surgical options, (such as UPPP, which would involve a referral to an ENT). I also explained the CPAP vs. AutoPAP treatment option to the patient, who indicated that she would be willing to try autoPAP vs CPAP if the need arises. I answered all her questions today and the patient was in agreement. I plan to see the patient back after the sleep study is completed and encouraged her to call with any interim questions, concerns, problems or updates.   Star Age, MD, PhD    Follow Up Instructions:  1. HST, sleep lab staff will reach out to patient to arrange for either sending the unit to home address or a "drive by pickup" and for tutorial, making sure patient is comfortable with the unit and usage, and return of equipment, if necessary.  2. Consider AutoPap therapy, if home sleep test confirms obstructive sleep apnea, patient agreeable. 3. We talked about alternative treatment options and current limitations, due to virus pandemic.  4. Follow-up after starting AutoPap therapy, follow-up to be scheduled according to set-up date, typically within 31 to 89 days post treatment start. 5. Pursue healthy lifestyle, good sleep hygiene, healthy weight. 6. Call for any interim questions or concerns.   I discussed the assessment and  treatment plan with the patient. The patient was provided an opportunity to ask questions and all were answered. The patient agreed with the plan and demonstrated an understanding of the instructions.   The patient was advised to call back or seek an  in-person evaluation if the symptoms worsen or if the condition fails to improve as anticipated.  I provided 30 minutes of non-face-to-face time during this encounter.   Star Age, MD

## 2019-01-06 NOTE — Patient Instructions (Signed)
Given verbally, during today's virtual video-based encounter, with verbal feedback received.   

## 2019-01-13 ENCOUNTER — Other Ambulatory Visit: Payer: Self-pay

## 2019-01-13 ENCOUNTER — Ambulatory Visit (HOSPITAL_COMMUNITY)
Admission: RE | Admit: 2019-01-13 | Discharge: 2019-01-13 | Disposition: A | Discharge status: Home / Self Care | Payer: 59 | Source: Ambulatory Visit | Attending: "Endocrinology | Admitting: "Endocrinology

## 2019-01-13 DIAGNOSIS — C73 Malignant neoplasm of thyroid gland: Secondary | ICD-10-CM | POA: Insufficient documentation

## 2019-01-20 ENCOUNTER — Encounter (INDEPENDENT_AMBULATORY_CARE_PROVIDER_SITE_OTHER): Payer: Self-pay | Admitting: *Deleted

## 2019-01-20 ENCOUNTER — Ambulatory Visit (INDEPENDENT_AMBULATORY_CARE_PROVIDER_SITE_OTHER): Payer: 59 | Admitting: Internal Medicine

## 2019-01-20 ENCOUNTER — Encounter (INDEPENDENT_AMBULATORY_CARE_PROVIDER_SITE_OTHER): Payer: Self-pay | Admitting: Internal Medicine

## 2019-01-20 ENCOUNTER — Other Ambulatory Visit: Payer: Self-pay

## 2019-01-20 VITALS — BP 140/79 | HR 69 | Temp 98.5°F | Resp 18 | Ht 63.0 in | Wt 211.7 lb

## 2019-01-20 DIAGNOSIS — R1310 Dysphagia, unspecified: Secondary | ICD-10-CM | POA: Diagnosis not present

## 2019-01-20 DIAGNOSIS — R1011 Right upper quadrant pain: Secondary | ICD-10-CM

## 2019-01-20 DIAGNOSIS — K582 Mixed irritable bowel syndrome: Secondary | ICD-10-CM | POA: Diagnosis not present

## 2019-01-20 DIAGNOSIS — K219 Gastro-esophageal reflux disease without esophagitis: Secondary | ICD-10-CM | POA: Diagnosis not present

## 2019-01-20 LAB — T4, FREE: Free T4: 1 ng/dL (ref 0.8–1.8)

## 2019-01-20 LAB — THYROGLOBULIN LEVEL: Thyroglobulin: <0.1 ng/mL — ABNORMAL LOW

## 2019-01-20 LAB — TSH: TSH: 1.42 m[IU]/L (ref 0.40–4.50)

## 2019-01-20 LAB — THYROGLOBULIN ANTIBODY

## 2019-01-20 NOTE — Progress Notes (Signed)
Presenting complaint;  Follow-up for GERD IBS and right subcostal pain. Patient complains of swallowing difficulty.  Database and subjective:  Patient is 63 year old Caucasian female who is here for scheduled visit.  She was last seen on 07/22/2018.  She initially presented with right subcostal pain and underwent extensive work-up and no etiology was found.  I felt her pain was either musculoskeletal or neuropathic.  She also has been evaluated for IBS and GERD  She states she is doing well as far as GERD symptoms are concerned.  She rarely has heartburn.  She denies nausea vomiting or throat symptoms.  She wants to back off on pantoprazole and see how she does. She now complains of difficulty swallowing liquids as well as solids.  She says about 2 weeks ago she drank coffee and immediately came back via her mouth and nasal cavity.  And other times she has difficulty swallowing solids and she points to suprasternal area.  She believes swallowing symptoms started sometime after thyroid surgery but it was infrequent and did not seem to bother her until recently. She says she had back surgery for disc disease in January 2020 and not using left elliptical but she is walking and doing some exercises.  She has gained 7 pounds since her last visit.    Current Medications: Outpatient Encounter Medications as of 01/20/2019  Medication Sig  . clonazePAM (KLONOPIN) 0.5 MG tablet Take 0.5 mg by mouth 2 (two) times daily as needed for anxiety.   . dicyclomine (BENTYL) 10 MG capsule Take 1-2 capsules (10-20 mg total) by mouth See admin instructions. Take 1 capsule (10 mg) by mouth in the morning, take 2 capsules (20 mg) by mouth at noon, & 1 capsule (10 mg) by mouth at bedtime.  . DULoxetine (CYMBALTA) 30 MG capsule Take 1 capsule (30 mg total) by mouth daily for 14 days, THEN 1 capsule (30 mg total) every other day for 14 days.  . hydrochlorothiazide (HYDRODIURIL) 25 MG tablet TAKE 1 TABLET EVERY OTHER DAY.   Marland Kitchen levothyroxine (SYNTHROID) 100 MCG tablet Take 1 tablet (100 mcg total) by mouth daily before breakfast.  . losartan (COZAAR) 100 MG tablet TAKE 1 TABLET ONCE DAILY.  . methocarbamol (ROBAXIN) 500 MG tablet Take 1 tablet (500 mg total) by mouth every 6 (six) hours as needed for muscle spasms. (Patient taking differently: Take 750 mg by mouth every 6 (six) hours as needed for muscle spasms. )  . metoprolol tartrate (LOPRESSOR) 25 MG tablet TAKE (1/2) TABLET AT BEDTIME.  . pantoprazole (PROTONIX) 40 MG tablet Take 1 tablet (40 mg total) by mouth daily before breakfast.  . potassium chloride SA (K-DUR,KLOR-CON) 20 MEQ tablet TAKE 1 TABLET TWICE DAILY FOR 3 DAYS THEN 1 TABLET DAILY.  . rosuvastatin (CRESTOR) 5 MG tablet TAKE 1 TABLET BY MOUTH ONCE DAILY.   No facility-administered encounter medications on file as of 01/20/2019.      Objective: Blood pressure 140/79, pulse 69, temperature 98.5 F (36.9 C), temperature source Oral, resp. rate 18, height 5\' 3"  (1.6 m), weight 211 lb 11.2 oz (96 kg). Patient is alert and in no acute distress. Conjunctiva is pink. Sclera is nonicteric Oropharyngeal mucosa is normal. She has small midline scar across lower neck from previous thyroid surgery. Cardiac exam with regular rhythm normal S1 and S2. No murmur or gallop noted. Lungs are clear to auscultation. Abdomen is full.  She has multiple laparoscopy scars.  On palpation is soft with mild tenderness midepigastric region.  No organomegaly  or masses. No LE edema or clubbing noted.   Assessment:  #1.  Chronic GERD.  She is doing well with antireflux measures and daily PPI.  It would be reasonable to decrease dose and see how she does.  #2.  Dysphagia.  She appears to have both pharyngeal and esophageal dysphagia.  She has had difficulty with liquids as well as solids.  I doubt that is related to her thyroid surgery.  Her last EGD was in October 2018 and did not reveal any esophageal abnormality. She  will be further evaluated with barium pill esophagogram and if the studies are normal she may benefit from evaluation by speech pathologist.  #3.  Irritable bowel syndrome.  It appears she is having more constipation than diarrhea and she therefore may have to titrate dicyclomine dose.  She should be using less on days when she is constipated and more on days when she has diarrhea.  #4.  Right subcostal pain.  She has been extensively evaluated for this pain.  Work-up is been negative and I feel this is neuropathic pain.  Since pain is more pronounced when she is constipated therefore it may be multifactorial etiology.  No further work-up at this time.   Plan:  She can try pantoprazole 40 mg every other day and use Pepcid OTC 20 mg on off days.  Will not change her prescription for now. Barium pill esophagogram. Patient advised to continue dicyclomine up to 40 mg a day or diarrhea days and maybe 20 mg on days when she is constipated. She needs to make sure that her bowels move at least once every new days if not daily.  She can use glycerin or Dulcolax suppository. Office visit in 6 months.

## 2019-01-20 NOTE — Patient Instructions (Signed)
Can try pantoprazole every other day and can take Pepcid OTC 20 mg on off days. Consider taking 20 or 30 mg of dicyclomine on days when you are constipated and 40 mg/day when you have diarrhea. Barium pill esophagogram to be scheduled.

## 2019-01-27 ENCOUNTER — Ambulatory Visit (INDEPENDENT_AMBULATORY_CARE_PROVIDER_SITE_OTHER): Payer: 59 | Admitting: "Endocrinology

## 2019-01-27 ENCOUNTER — Other Ambulatory Visit: Payer: Self-pay

## 2019-01-27 ENCOUNTER — Encounter: Payer: Self-pay | Admitting: "Endocrinology

## 2019-01-27 DIAGNOSIS — E890 Postprocedural hypothyroidism: Secondary | ICD-10-CM

## 2019-01-27 DIAGNOSIS — C73 Malignant neoplasm of thyroid gland: Secondary | ICD-10-CM

## 2019-01-27 MED ORDER — LEVOTHYROXINE SODIUM 100 MCG PO TABS: 100.0000 ug | ORAL_TABLET | Freq: Every day | ORAL | 6 refills | Status: DC

## 2019-01-27 NOTE — Progress Notes (Signed)
Endocrinology follow-up note                                            01/27/2019, 11:41 AM   Subjective:    Patient ID: Rebecca Scott, female    DOB: Jul 09, 1956, PCP Janora Norlander, DO   Past Medical History:  Diagnosis Date  . Anemia   . Anxiety   . Arthritis   . Asthma    seasonal asthma rarely   . Chronic kidney disease    renal mass right kidney   . CPAP (continuous positive airway pressure) dependence   . Diverticulitis   . Family history of cancer of extrahepatic bile ducts   . Family history of colon cancer   . Family history of kidney cancer   . Family history of melanoma   . GERD (gastroesophageal reflux disease)   . Hemorrhoids   . History of kidney stones   . HTN (hypertension) 07/29/2014  . Hyperlipemia   . Hypertension   . Migraine    no migraines now, will have stress headaches  . Multinodular goiter 08/21/2017   Last Assessment & Plan:  She will need an f/u u/s of thyroid to reassess nodules. Repeat in 07/2018 Nodules are too small for biopsy now.  . 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 Papillary thyroid carcinoma ([place]) 03/10/2018  . PONV (postoperative nausea and vomiting)    patient reports being "slower to wake than average "   . Pre-diabetes    "ive been told i'm borderline"   . Sleep apnea    CPAP use    Past Surgical History:  Procedure Laterality Date  . ABDOMINAL HYSTERECTOMY    . APPENDECTOMY  1978  . BIOPSY  07/08/2017   Procedure: BIOPSY;  Surgeon: Rogene Houston, MD;  [facility];  Service: Endoscopy;;  gastric  . CHOLECYSTECTOMY  2005  . COLONOSCOPY WITH PROPOFOL N/A 10/28/2017   Procedure: COLONOSCOPY WITH PROPOFOL;  Surgeon: Rogene Houston, MD;  [facility];  Service: Endoscopy;  Laterality: N/A;  7:30  . ESOPHAGOGASTRODUODENOSCOPY N/A 07/08/2017   Procedure: ESOPHAGOGASTRODUODENOSCOPY (EGD);  Surgeon: Rogene Houston, MD;  [facility];  Service: Endoscopy;  Laterality: N/A;  220  . LUMBAR LAMINECTOMY/DECOMPRESSION MICRODISCECTOMY N/A 09/26/2018   Procedure: L4-5 MICRODISCECTOMY;  Surgeon: [name], MD;  Location: [facility];  Service: Orthopedics;  Laterality: N/A;  . NASAL SEPTUM SURGERY    . ROBOTIC ASSITED PARTIAL NEPHRECTOMY Right 05/17/2017   Procedure: XI ROBOTIC ASSITED PARTIAL NEPHRECTOMY;  Surgeon: Alexis Frock, MD;  Location: [facility];  Service: Urology;  Laterality: Right;  . Ruptured Disk L5    . THYROIDECTOMY N/A 03/10/2018   Procedure: TOTAL THYROIDECTOMY;  Surgeon: Aviva Signs, MD;  Location: [facility];  Service: General;  Laterality: N/A;   Social History   Socioeconomic History  . Marital status: Widowed    Spouse name: Not on file  . Number of children: Not on file  . Years of education: Not on file  . Highest education level: Not on file  Occupational History  . Not on file  Social Needs  . Financial resource strain: Not on file  . Food insecurity:    Worry: Not on file    Inability: Not on file  .  Transportation needs:    Medical: Not on file    Non-medical: Not on file  Tobacco Use  . Smoking status: Never Smoker  . Smokeless tobacco: Never Used  Substance and Sexual Activity  . Alcohol use: Yes    Comment: rarely   . Drug use: No  . Sexual activity: Not Currently  Lifestyle  . Physical activity:    Days per week: Not on file    Minutes per session: Not on file  . Stress: Not on file  Relationships  . Social connections:    Talks on phone: Not on file    Gets together: Not on file    Attends religious service: Not on file    Active member of club or organization: Not on file    Attends meetings of clubs or organizations: Not on file    Relationship status: Not on file  Other Topics Concern  . Not on file  Social History Narrative  . Not on file   Outpatient Encounter Medications as of 01/27/2019  Medication Sig  . clonazePAM (KLONOPIN) 0.5 MG tablet Take 0.5 mg by mouth  2 (two) times daily as needed for anxiety.   . dicyclomine (BENTYL) 10 MG capsule Take 1-2 capsules (10-20 mg total) by mouth See admin instructions. Take 1 capsule (10 mg) by mouth in the morning, take 2 capsules (20 mg) by mouth at noon, & 1 capsule (10 mg) by mouth at bedtime.  . hydrochlorothiazide (HYDRODIURIL) 25 MG tablet TAKE 1 TABLET EVERY OTHER DAY.  Marland Kitchen levothyroxine (SYNTHROID) 100 MCG tablet Take 1 tablet (100 mcg total) by mouth daily before breakfast.  . losartan (COZAAR) 100 MG tablet TAKE 1 TABLET ONCE DAILY.  . methocarbamol (ROBAXIN) 500 MG tablet Take 1 tablet (500 mg total) by mouth every 6 (six) hours as needed for muscle spasms. (Patient taking differently: Take 750 mg by mouth every 6 (six) hours as needed for muscle spasms. )  . metoprolol tartrate (LOPRESSOR) 25 MG tablet TAKE (1/2) TABLET AT BEDTIME.  . pantoprazole (PROTONIX) 40 MG tablet Take 1 tablet (40 mg total) by mouth daily before breakfast.  . potassium chloride SA (K-DUR,KLOR-CON) 20 MEQ tablet TAKE 1 TABLET TWICE DAILY FOR 3 DAYS THEN 1 TABLET DAILY.  . rosuvastatin (CRESTOR) 5 MG tablet TAKE 1 TABLET BY MOUTH ONCE DAILY.  . [DISCONTINUED] DULoxetine (CYMBALTA) 30 MG capsule Take 1 capsule (30 mg total) by mouth daily for 14 days, THEN 1 capsule (30 mg total) every other day for 14 days.  . [DISCONTINUED] levothyroxine (SYNTHROID) 100 MCG tablet Take 1 tablet (100 mcg total) by mouth daily before breakfast.   No facility-administered encounter medications on file as of 01/27/2019.    ALLERGIES: Allergies  Allergen Reactions  . Mango Flavor Swelling and Rash    Tongue swelling, feels like throat is closing in on her   . Iodinated Diagnostic Agents Hives and Itching  . Isovue [Iopamidol] Hives and Itching    And hives   . Nitrofurantoin Hives  . Other Hives, Itching, Rash and Other (See Comments)    Use Paper tape ONLY  . Sulfa Antibiotics Hives  . Adhesive [Tape] Rash and Other (See Comments)    Use Paper  tape ONLY    VACCINATION STATUS: Immunization History  Administered Date(s) Administered  . Influenza,inj,Quad PF,6+ Mos 06/06/2018  . Influenza-Unspecified 06/24/2017  . Tdap 09/16/2018    HPI Rebecca Scott is 63 y.o. female who is returning for follow-up after recent near total  thyroidectomy for thyroid malignancy, and postsurgical hypothyroidism.    -She underwent near total thyroidectomy on March 10, 2018 which revealed bilateral follicular variant papillary thyroid carcinoma Pathologic Stage Classification (pTNM, AJCC 8th Edition): pT2(multifocal), pNX.  She is currently on Synthroid 100 mcg p.o. every morning, reports compliance. -She has recovered from her surgery very well.  She denies dysphagia, odynophagia, voice change, nor shortness of breath.  -She was found to have multinodular goiter in November 2018.  At that time she was found to have 2.4 cm nodule in the 5 cm right lobe of thyroid and 1.5 cm nodule in the 4.3 cm left lobe.  Repeat ultrasound in April 2019 showed thyroid remaining more or less the same size however the nodules were observed to grow to 2.7 cm on the right lobe and 1.7 cm in the left lobe.  Subsequently, earlier this month she underwent fine-needle aspiration of the right lobe nodule which showed follicular lesion of undetermined significance.  Molecular studies were not sent.   -Prior to her surgery, patient complained of  foreign body sensation and occasional choking in her neck while eating and breathing problem when she was laying on her back.   -She denies any exposure to neck radiation.  She denies family history of thyroid cancer, however multiple family members with what appears to be hypothyroidism.  She denies heat/cold intolerance.   -Her previsit thyroid/neck ultrasound is consistent with surgical changes with no evidence of residual or recurrent thyroid malignancy. -Her previsit thyroid function tests are consistent with appropriate replacement  with thyroid hormone.    Review of Systems  Constitutional: + lost weight since last visit,  no fatigue, no subjective hyperthermia, no subjective hypothermia Eyes: no blurry vision, no xerophthalmia ENT: no sore throat, + recent thyroidectomy,  - dysphagia , - odynophagia, no hoarseness Cardiovascular: no Chest Pain, no Shortness of Breath, no palpitations, no leg swelling Respiratory: no cough, - SOB Gastrointestinal: no Nausea/Vomiting, + occasional loose stool/Diarhhea Musculoskeletal: no muscle/joint aches Skin: no rashes Neurological: no tremors, no numbness, no tingling, no dizziness Psychiatric: no depression, no anxiety  Objective:    There were no vitals taken for this visit.  Wt Readings from Last 3 Encounters:  01/20/19 211 lb 11.2 oz (96 kg)  11/13/18 231 lb (104.8 kg)  10/27/18 213 lb (96.6 kg)    Physical Exam  Constitutional: + Obese, not in acute distress, normal state of mind Eyes:  EOMI, no exophthalmos Neck: Supple Respiratory: Adequate breathing efforts Musculoskeletal: no gross deformities, strength intact in all four extremities Skin:  no rashes, no hyperemia Neurological: no tremor with outstretched hands.   Diabetic Labs (most recent): Lab Results  Component Value Date   HGBA1C 5.1 09/08/2018   HGBA1C 5.4 03/06/2018   HGBA1C 5.7 (H) 05/09/2017     Recent Results (from the past 2160 hour(s))  TSH     Status: None   Collection Time: 01/13/19 12:04 PM  Result Value Ref Range   TSH 1.42 0.40 - 4.50 mIU/L  T4, free     Status: None   Collection Time: 01/13/19 12:04 PM  Result Value Ref Range   Free T4 1.0 0.8 - 1.8 ng/dL  Thyroglobulin antibody     Status: None   Collection Time: 01/13/19 12:04 PM  Result Value Ref Range   Thyroglobulin Ab <1 < or = 1 IU/mL  Thyroglobulin Level     Status: Abnormal   Collection Time: 01/13/19 12:04 PM  Result Value Ref Range  Thyroglobulin <0.1 (L) ng/mL    Comment:       Reference Range:       Intact  Thyroid   2.8-40.9       Athyrotic        <0.1 .       Note: Abnormal flagging is based       on the reference interval for        patients with intact thyroid. . . This test was performed using the Beckman Coulter  chemiluminescent method. Values obtained from different assay methods cannot be used interchangeably. Thyroglobulin levels, regardless of value, should not be interpreted as absolute evidence of the presence or absence of disease. .    Comment      Comment: . Thyroglobulin antibodies (TGAB) interfere with thyroglobulin (TG) assays; therefore, TGAB assay should always be performed in conjunction with a TG assay. . . For additional information, please refer to  http://education.questdiagnostics.com/faq/FAQ202  (This link is being provided for informational/ educational purposes only.) .        Diagnosis 1. Thyroid, lobectomy, left lobe and isthmus - PAPILLARY THYROID CARCINOMA, FOLLICULAR VARIANT, 1.3 CM. - TUMOR CONFINED WITHIN THYROID CAPSULE. - MARGINS NOT INVOLVED. 2. Thyroid, lobectomy, right lobe - PAPILLARY THYROID CARCINOMA, FOLLICULAR VARIANT, 2.6 CM. - TUMOR FOCALLY LESS THAN 0.1 CM FROM MARGIN.   Pathologic Stage Classification (pTNM, AJCC 8th Edition): pT2(multifocal), pNX.   Thyrogen stimulated whole-body scan on June 23, 2018 FINDINGS: Radio iodine accumulation is seen at the thyroid bed bilaterally consistent with thyroid remnant. No distant sites of abnormal radio iodine accumulation are identified to suggest iodine-avid metastatic thyroid cancer.  IMPRESSION: Thyroid remnant.  No scintigraphic evidence of iodine-avid thyroid cancer metastases.  Thyroid/neck ultrasound for Jan 13, 2019- consistent with surgical changes with absence of residual or recurrent thyroid disease.  Assessment & Plan:   1.  Follicular variant of papillary thyroid cancer  -She is status post negative her thyroidectomy on March 10, 2018 with Dr. Aviva Signs resulting in  Pathologic Stage Classification (pTNM, AJCC 8th Edition): pT2(multifocal), pNX.  -She he is status post radioactive iodine remnant ablation followed by whole body scan which was completed on June 24, 2018 at [facility].  -The whole body scan revealed some thyroid remnant in the thyroid bed, no evidence of distant metastasis.  Her previsit thyroid/neck ultrasound is consistent with surgical changes of absent residual or recurrent thyroid disease. -She willd not need any further intervention at this time, -I had a long discussion with her regarding the need for continued monitoring with yearly imaging studies for next 5-10 years.  Her thyroglobulin levels are low at <0.1 with negative thyroglobulin antibodies. - 2.  Postsurgical hypothyroidism  -Her previsit thyroid function tests are consistent with appropriate replacement and suppression of TSH.    -She is advised to continue Synthroid 100 mcg p.o. every morning.   - We discussed about the correct intake of her thyroid hormone, on empty stomach at fasting, with water, separated by at least 30 minutes from breakfast and other medications,  and separated by more than 4 hours from calcium, iron, multivitamins, acid reflux medications (PPIs). -Patient is made aware of the fact that thyroid hormone replacement is needed for life, dose to be adjusted by periodic monitoring of thyroid function tests.   3.  Hypertension: -She has responded to hydrochlorthiazide added to her losartan with her blood pressure being on target at 130/83.   - I advised her  to maintain close  follow up with Janora Norlander, DO for primary care needs.  - Time spent with the patient: 15 min. Rebecca Scott participated in the discussions, expressed understanding, and voiced agreement with the above plans.  All questions were answered to her satisfaction. she is encouraged to contact clinic should she have any questions or concerns  prior to her return visit.   Follow up plan: Return in about 6 months (around 07/30/2019) for Follow up with Pre-visit Labs.   Glade Lloyd, MD [facility] [address] [place], [place] [address] Phone: [phone]  Fax: [phone]     01/27/2019, 11:41 AM  This note was partially dictated with voice recognition software. Similar sounding words can be transcribed inadequately or may not  be corrected upon review.

## 2019-01-28 ENCOUNTER — Ambulatory Visit (HOSPITAL_COMMUNITY)
Admission: RE | Admit: 2019-01-28 | Discharge: 2019-01-28 | Disposition: A | Discharge status: Home / Self Care | Payer: 59 | Source: Ambulatory Visit | Attending: Internal Medicine | Admitting: Internal Medicine

## 2019-01-28 DIAGNOSIS — R1310 Dysphagia, unspecified: Secondary | ICD-10-CM | POA: Diagnosis not present

## 2019-02-09 ENCOUNTER — Other Ambulatory Visit (HOSPITAL_COMMUNITY): Payer: Self-pay | Admitting: Specialist

## 2019-02-09 ENCOUNTER — Other Ambulatory Visit (INDEPENDENT_AMBULATORY_CARE_PROVIDER_SITE_OTHER): Payer: Self-pay | Admitting: Internal Medicine

## 2019-02-09 DIAGNOSIS — R1319 Other dysphagia: Secondary | ICD-10-CM

## 2019-02-09 DIAGNOSIS — R1310 Dysphagia, unspecified: Secondary | ICD-10-CM

## 2019-02-11 ENCOUNTER — Other Ambulatory Visit: Payer: Self-pay

## 2019-02-11 ENCOUNTER — Ambulatory Visit (INDEPENDENT_AMBULATORY_CARE_PROVIDER_SITE_OTHER): Payer: 59 | Admitting: Neurology

## 2019-02-11 DIAGNOSIS — G479 Sleep disorder, unspecified: Secondary | ICD-10-CM

## 2019-02-11 DIAGNOSIS — G4733 Obstructive sleep apnea (adult) (pediatric): Secondary | ICD-10-CM | POA: Diagnosis not present

## 2019-02-11 DIAGNOSIS — Z6841 Body Mass Index (BMI) 40.0 and over, adult: Secondary | ICD-10-CM

## 2019-02-11 DIAGNOSIS — R519 Headache, unspecified: Secondary | ICD-10-CM

## 2019-02-11 DIAGNOSIS — G4719 Other hypersomnia: Secondary | ICD-10-CM

## 2019-02-17 ENCOUNTER — Encounter: Payer: Self-pay | Admitting: Orthopaedic Surgery

## 2019-02-17 ENCOUNTER — Telehealth: Payer: Self-pay

## 2019-02-17 NOTE — Telephone Encounter (Signed)
I called pt. I advised pt that Dr. Rexene Alberts reviewed their sleep study results and found that pt has moderate osa. Dr. Rexene Alberts recommends that pt start an auto pap at home. I reviewed PAP compliance expectations with the pt. Pt is agreeable to starting an auto-PAP. I advised pt that an order will be sent to a DME, [facility], and [place] will call the pt within about one week after they file with the pt's insurance. [facility] will show the pt how to use the machine, fit for masks, and troubleshoot the auto-PAP if needed. A follow up appt was made for insurance purposes with Dr. Rexene Alberts on 05/20/19 at 1:00pm. Pt verbalized understanding to arrive 15 minutes early and bring their auto-PAP. A letter with all of this information in it will be sent to the pt's mychart account as a reminder. I verified with the pt that the address we have on file is correct. Pt verbalized understanding of results. Pt had no questions at this time but was encouraged to call back if questions arise. I have sent the order to [facility] and have received confirmation that they have received the order.

## 2019-02-17 NOTE — Progress Notes (Signed)
Patient referred by her PCP, seen virtually by me on 01/06/19, HST on 02/13/19.    Please call and notify the patient that the recent home sleep test showed obstructive sleep apnea in the moderate range. While I recommend treatment for this in the form CPAP, we are not yet bringing patients in for in-lab testing for CPAP titration studies, due to the virus pandemic; therefore, I suggest we start her on autoPAP at home, which means, that we don't have to bring her in for a sleep study with CPAP, but will let her start using an autoPAP machine at home, through a DME company (of patient's choice, or as per insurance requirement, as per in [employer], if there are such restrictions, depending on insurance carrier). She may have an existing DME, as she has a CPAP machine.  The DME representative will educate the patient on how to use the machine, how to put the mask on, etc. I have placed an order in the chart. Please send referral, talk to patient, send report to referring MD. We will need a FU in sleep clinic for 10 weeks post-PAP set up, please arrange that with me or one of our NPs.  Also, please remind patient about the importance of compliance with PAP usage; this is an Designer, industrial/product, but good compliance also helps [place] track improvements in patient's sleep related complaints and objective improvements, such as BP and weight for example or nocturia or headaches, etc. For concerns and questions about how to clean the PAP machine and the supplies and how frequently to change the hose, mask and filters, etc., patient can call the DME company, for more information, education and troubleshooting. Especially in the current situation, I recommend, patients be extra mindful about hand hygiene, handling the PAP equipment only with clean hands, wipe the mask daily, keep little one and four-legged companions (and any other pets for that matter) away from the machine and mask at all times.    Thanks,   Star Age, MD, PhD [facility])

## 2019-02-17 NOTE — Addendum Note (Signed)
Addended by: Star Age on: 02/17/2019 08:13 AM   Modules accepted: Orders

## 2019-02-17 NOTE — Procedures (Signed)
Patient Information     First Name: Rebecca Last Name: Scott ID: [id number]  Birth Date: 25-Dec-1955 Age: 63 Gender: Female  Referring Provider: Janora Norlander, DO BMI: 41.0 (W=231 lb, H=5' 3'')  Neck Circ.:  43 '' Epworth:  11/24   Sleep Study Information    Study Date: Feb 11, 2019 S/H/A Version: [id number] / 4.1.1528 / 102  History: 63 year old woman with a history of prediabetes, papillary thyroid cancer, migraine headaches, hypertension, hyperlipidemia, history of kidney stones, reflux disease, diverticulitis, asthma, arthritis, anemia, anxiety, and obesity, who presents for re-evaluation of her obstructive sleep apnea. Summary & Diagnosis:                OSA Recommendations:      This home sleep test demonstrates moderate obstructive sleep apnea with a total AHI of 15.5/hour and O2 nadir of 91%. Treatment with positive airway pressure (PAP) - in the form of CPAP - is recommended. This will require, ideally, a full night CPAP titration study for proper treatment settings, O2 monitoring and mask fitting. Based on the severity of the sleep disordered breathing, an attended titration study is indicated. However, under the current circumstances (i.e. the COVID-19 pandemic), in order to ensure continuity of care and for the safety of the patient and healthcare professionals, she will be advised to proceed with an autoPAP titration/trial at home. A proper overnight, lab-attended PAP titration study with CPAP may be helpful or needed down the road to optimize treatment, when considered safe. Please note, that untreated obstructive sleep apnea may carry additional perioperative morbidity. Patients with significant obstructive sleep apnea should receive perioperative PAP therapy and the surgeons and particularly the anesthesiologist should be informed of the diagnosis and the severity of the sleep disordered breathing. Patient will be reminded regarding compliance with the PAP machine and to be  mindful of cleanliness with the equipment and timely with supply changes (i.e. changing filter, mask, hose, humidifier chamber on an ongoing basis, as recommended, and cleaning parts that touch the face and nose daily, etc). The patient should be cautioned not to drive, work at heights, or operate dangerous or heavy equipment when tired or sleepy. Review and reiteration of good sleep hygiene measures should be pursued with any patient. Other causes of the patient's symptoms, including circadian rhythm disturbances, an underlying mood disorder, medication effect and/or an underlying medical problem cannot be ruled out based on this test. Clinical correlation is recommended. The patient and her referring provider will be notified of the test results. The patient will be seen in follow up in sleep clinic at [facility], either for a face-to-face or virtual visit, whichever feasible and recommended at the time.  I certify that I have reviewed the raw data recording prior to the issuance of this report in accordance with the standards of the American Academy of Sleep Medicine (AASM).   Star Age, MD, PhD [facility]) Diplomat, ABPN (Neurology and Sleep)           Sleep Summary  Oxygen Saturation Statistics   Start Study Time: End Study Time: Total Recording Time:  11:21:06 PM         8:03:45 AM    8 h, 42 min  Total Sleep Time % REM of Sleep Time:  7 h, 27 min  19.4    Mean: 95 Minimum: 91 Maximum: 100  Mean of Desaturations Nadirs (%):   93  Oxygen Desaturation %: 4-9 10-20 >20 Total  Events Number Total  39 100.0  0 0.0  0 0.0  39 100.0  Oxygen Saturation: <90 <=88 <85 <80 <70  Duration (minutes): Sleep % 0.0 0.0 0.0 0.0 0.0 0.0 0.0 0.0 0.0 0.0     Respiratory Indices      Total Events REM NREM All Night  pRDI:  230  pAHI:  115 ODI:  39  pAHIc:  9  % CSR: 0.0 35.5 18.8 8.3 2.1 29.9 14.7 4.5 1.0 30.9 15.5 5.3 1.2       Pulse Rate  Statistics during Sleep (BPM)      Mean:  59 Minimum: 42 Maximum: 85    Indices are calculated using technically valid sleep time of  7 hrs, 26 min. pRDI/pAHI are calculated using oxi desaturations ? 3%  Body Position Statistics  Position Supine Prone Right Left Non-Supine  Sleep (min) 220.5 55.5 39.0 81.5 176.0  Sleep % 49.3 12.4 8.7 18.2 39.3  pRDI 39.1 31.6 13.9 15.5 20.2  pAHI 19.4 15.2 7.7 5.2 8.9  ODI 6.3 8.7 3.1 1.5 4.1     Snoring Statistics Snoring Level (dB) >40 >50 >60 >70 >80 >Threshold (45)  Sleep (min) 385.4 204.7 63.3 1.1 0.0 278.3  Sleep % 86.1 45.7 14.1 0.2 0.0 62.2    Mean: 50 dB Sleep Stages Chart                        pAHI=15.5                                     Mild              Moderate                    Severe                                                    5              15                    30

## 2019-02-17 NOTE — Telephone Encounter (Signed)
-----   Message from Star Age, MD sent at 02/17/2019  8:13 AM EDT ----- Patient referred by her PCP, seen virtually by me on 01/06/19, HST on 02/13/19.    Please call and notify the patient that the recent home sleep test showed obstructive sleep apnea in the moderate range. While I recommend treatment for this in the form CPAP, we are not yet bringing patients in for in-lab testing for CPAP titration studies, due to the virus pandemic; therefore, I suggest we start her on autoPAP at home, which means, that we don't have to bring her in for a sleep study with CPAP, but will let her start using an autoPAP machine at home, through a DME company (of patient's choice, or as per insurance requirement, as per in [employer], if there are such restrictions, depending on insurance carrier). She may have an existing DME, as she has a CPAP machine.  The DME representative will educate the patient on how to use the machine, how to put the mask on, etc. I have placed an order in the chart. Please send referral, talk to patient, send report to referring MD. We will need a FU in sleep clinic for 10 weeks post-PAP set up, please arrange that with me or one of our NPs.  Also, please remind patient about the importance of compliance with PAP usage; this is an Designer, industrial/product, but good compliance also helps [place] track improvements in patient's sleep related complaints and objective improvements, such as BP and weight for example or nocturia or headaches, etc. For concerns and questions about how to clean the PAP machine and the supplies and how frequently to change the hose, mask and filters, etc., patient can call the DME company, for more information, education and troubleshooting. Especially in the current situation, I recommend, patients be extra mindful about hand hygiene, handling the PAP equipment only with clean hands, wipe the mask daily, keep little one and four-legged companions (and any other pets for that matter)  away from the machine and mask at all times.     Thanks,   Star Age, MD, PhD [facility])

## 2019-02-18 ENCOUNTER — Other Ambulatory Visit: Payer: Self-pay

## 2019-02-18 ENCOUNTER — Encounter (HOSPITAL_COMMUNITY): Payer: Self-pay | Admitting: Speech Pathology

## 2019-02-18 ENCOUNTER — Ambulatory Visit (HOSPITAL_COMMUNITY): Payer: 59 | Attending: Internal Medicine | Admitting: Speech Pathology

## 2019-02-18 ENCOUNTER — Other Ambulatory Visit: Payer: Self-pay | Admitting: Orthopaedic Surgery

## 2019-02-18 ENCOUNTER — Ambulatory Visit (HOSPITAL_COMMUNITY)
Admission: RE | Admit: 2019-02-18 | Discharge: 2019-02-18 | Disposition: A | Discharge status: Home / Self Care | Payer: 59 | Source: Ambulatory Visit | Attending: Internal Medicine | Admitting: Internal Medicine

## 2019-02-18 DIAGNOSIS — R1312 Dysphagia, oropharyngeal phase: Secondary | ICD-10-CM | POA: Insufficient documentation

## 2019-02-18 DIAGNOSIS — R1310 Dysphagia, unspecified: Secondary | ICD-10-CM

## 2019-02-18 MED ORDER — HYDROCODONE-ACETAMINOPHEN 5-325 MG PO TABS: 1.0000 | ORAL_TABLET | Freq: Four times a day (QID) | ORAL | 0 refills | Status: DC | PRN

## 2019-02-18 MED ORDER — METHOCARBAMOL 500 MG PO TABS: 500.0000 mg | ORAL_TABLET | Freq: Three times a day (TID) | ORAL | 0 refills | Status: AC | PRN

## 2019-02-18 NOTE — Progress Notes (Signed)
#  20 of each sent in .

## 2019-02-18 NOTE — Therapy (Signed)
Rebecca Scott, [place], [address] Phone: [phone]   Fax:  [phone]  Modified Barium Swallow  Patient Details  Name: Rebecca Scott MRN: [id number] Date of Birth: [birth year] No data recorded  Encounter Date: 02/18/2019  End of Session - 02/18/19 1254    Visit Number  1    Number of Visits  1    Authorization Type  [employer]    SLP Start Time  1150    SLP Stop Time   1217    SLP Time Calculation (min)  27 min    Activity Tolerance  Patient tolerated treatment well       Past Medical History:  Diagnosis Date  . Anemia   . Anxiety   . Arthritis   . Asthma    seasonal asthma rarely   . Chronic kidney disease    renal mass right kidney   . CPAP (continuous positive airway pressure) dependence   . Diverticulitis   . Family history of cancer of extrahepatic bile ducts   . Family history of colon cancer   . Family history of kidney cancer   . Family history of melanoma   . GERD (gastroesophageal reflux disease)   . Hemorrhoids   . History of kidney stones   . HTN (hypertension) 07/29/2014  . Hyperlipemia   . Hypertension   . Migraine    no migraines now, will have stress headaches  . Multinodular goiter 08/21/2017   Last Assessment & Plan:  She will need an f/u u/s of thyroid to reassess nodules. Repeat in 07/2018 Nodules are too small for biopsy now.  . Papillary renal cell carcinoma ([place]) 05/2017   s/p partial nephrectomy, right  . Papillary thyroid carcinoma ([place]) 03/10/2018  . PONV (postoperative nausea and vomiting)    patient reports being "slower to wake than average "   . Pre-diabetes    "ive been told i'm borderline"   . Sleep apnea    CPAP use     Past Surgical History:  Procedure Laterality Date  . ABDOMINAL HYSTERECTOMY    . APPENDECTOMY  1978  . BIOPSY  07/08/2017   Procedure: BIOPSY;  Surgeon: Rogene Houston, MD;  [facility];  Service: Endoscopy;;  gastric  .  CHOLECYSTECTOMY  2005  . COLONOSCOPY WITH PROPOFOL N/A 10/28/2017   Procedure: COLONOSCOPY WITH PROPOFOL;  Surgeon: Rogene Houston, MD;  [facility];  Service: Endoscopy;  Laterality: N/A;  7:30  . ESOPHAGOGASTRODUODENOSCOPY N/A 07/08/2017   Procedure: ESOPHAGOGASTRODUODENOSCOPY (EGD);  Surgeon: Rogene Houston, MD;  [facility];  Service: Endoscopy;  Laterality: N/A;  220  . LUMBAR LAMINECTOMY/DECOMPRESSION MICRODISCECTOMY N/A 09/26/2018   Procedure: L4-5 MICRODISCECTOMY;  Surgeon: [name], MD;  Location: [place];  Service: Orthopedics;  Laterality: N/A;  . NASAL SEPTUM SURGERY    . ROBOTIC ASSITED PARTIAL NEPHRECTOMY Right 05/17/2017   Procedure: XI ROBOTIC ASSITED PARTIAL NEPHRECTOMY;  Surgeon: Alexis Frock, MD;  Location: [facility];  Service: Urology;  Laterality: Right;  . Ruptured Disk L5    . THYROIDECTOMY N/A 03/10/2018   Procedure: TOTAL THYROIDECTOMY;  Surgeon: Aviva Signs, MD;  Location: [facility];  Service: General;  Laterality: N/A;    There were no vitals filed for this visit.  Subjective Assessment - 02/18/19 1251    Subjective  "Liquids have been coming back into my nose."    Special Tests  MBSS    Currently in Pain?  No/denies    Multiple Pain Sites  No          General - 02/18/19 1252      General Information   Date of Onset  01/28/19    HPI  Rebecca Scott is a 63 yo female who was referred for MBSS by Dr. Laural Golden due to Pt with reports of solid and liquid dysphagia with occasional nasal regurgitation of liquids. She had total thyroidectomy 03/2018, has OSA, and had a barium swallow 01/2018 (WNL). She states her reflux is well controlled on pantoprazole.     Type of Study  MBS-Modified Barium Swallow Study    Diet Prior to this Study  Regular;Thin liquids    Temperature Spikes Noted  No    Respiratory Status  Room air    History of Recent Intubation  No    Behavior/Cognition  Alert;Cooperative    Oral Cavity Assessment  Within Functional  Limits    Oral Care Completed by SLP  No    Oral Cavity - Dentition  Adequate natural dentition    Vision  Functional for self feeding    Self-Feeding Abilities  Able to feed self    Patient Positioning  Upright in chair    Baseline Vocal Quality  Normal    Volitional Cough  Strong    Volitional Swallow  Able to elicit    Anatomy  Within functional limits    Pharyngeal Secretions  Not observed secondary MBS         Oral Preparation/Oral Phase - 02/18/19 1253      Oral Preparation/Oral Phase   Oral Phase  Within functional limits      Electrical stimulation - Oral Phase   Was Electrical Stimulation Used  No       Pharyngeal Phase - 02/18/19 1253      Pharyngeal Phase   Pharyngeal Phase  Within functional limits      Electrical Stimulation - Pharyngeal Phase   Was Electrical Stimulation Used  No       Cricopharyngeal Phase - 02/18/19 1253      Cervical Esophageal Phase   Cervical Esophageal Phase  Within functional limits        Plan - 02/18/19 1255    Clinical Impression Statement  Oropharyngeal swallow is WNL. Pt with slight piecemeal deglutition with solids, clearing bolus in two swallows. Hyolaryngeal excursion and pharyngeal pressure WNL; no penetration, aspiration, or significant residuals noted. Pt was noted to clear her throat constantly and belch throughout her visit. The imaging and this study were reviewed with Mrs. Smylie. No nasal regurgitation was noted during the study, however when Pt took sequential cup sips when taking the barium tablet, pharynx was noted to briefly hold liquids in a greater amount before clearing- likely due to large and repetitive bolus. SLP advised Pt to take small, cup sips and avoid sequential swallows. She did indicate noting nasal regurgitation only when she takes large, sequential swallows of liquids. Pt may have LPR due to excessive throat clearing throughout her visit (this was constant and occurred before po given as well). She  may benefit from an ENT consult and/or change in her reflux medications. She was advised to avoid excessive throat clearing and replace with short cough, dry swallow, and/or sip of water. No further SLP services indicated at this time.   Consulted and Agree with Plan of Care  Patient       Patient will benefit from skilled therapeutic intervention in order to improve the  following deficits and impairments:   Dysphagia, oropharyngeal phase    Recommendations/Treatment - 02/18/19 1253      Swallow Evaluation Recommendations   Recommended Consults  Consider ENT evaluation   Pt with constant throat clearing   SLP Diet Recommendations  Thin;Age appropriate regular    Liquid Administration via  Cup;Straw    Medication Administration  Whole meds with liquid    Supervision  Patient able to self feed    Compensations  Small sips/bites    Postural Changes  Seated upright at 90 degrees;Remain upright for at least 30 minutes after feeds/meals       Prognosis - 02/18/19 1254      Prognosis   Prognosis for Safe Diet Advancement  Good      Individuals Consulted   Consulted and Agree with Results and Recommendations  Patient    Report Sent to   Referring physician       Problem List Patient Active Problem List   Diagnosis Date Noted  . Genetic testing 12/04/2018  . Family history of melanoma   . Family history of colon cancer   . Family history of kidney cancer   . Family history of cancer of extrahepatic bile ducts   . S/P lumbar microdiscectomy 10/09/2018  . Right foot drop 09/23/2018  . Stress incontinence in female 09/16/2018  . Degenerative disc disease at L5-S1 level 06/06/2018  . Malignant neoplasm of thyroid gland ([place]) 05/27/2018  . Postsurgical hypothyroidism 05/27/2018  . Hypokalemia 04/20/2018  . Diarrhea 04/20/2018  . Hx of papillary thyroid carcinoma 03/26/2018  . S/P total thyroidectomy 03/10/2018  . Obstructive sleep apnea 03/05/2018  . Essential hypertension,  benign 02/18/2018  . Aortic atherosclerosis ([place]) 01/24/2018  . History of renal cell carcinoma 12/03/2017  . Reactive depression 10/22/2017  . Prediabetes 08/21/2017  . Gastroesophageal reflux disease without esophagitis 07/05/2017  . Hyperlipidemia 07/29/2014  . Syncope 07/29/2014  . Mixed stress and urge urinary incontinence 01/14/2014  . Recurrent UTI 01/14/2014  . Vaginal atrophy 01/14/2014  . Benign lesion of eyelid 02/07/2012   Thank you,  Genene Churn, [place]  [facility] 02/18/2019, 12:57 PM  [place], [place], [address] Phone: [phone]   Fax:  [phone]  Name: SARIYA TRICKEY MRN: [id number] Date of Birth: [birth year]

## 2019-02-20 ENCOUNTER — Other Ambulatory Visit (INDEPENDENT_AMBULATORY_CARE_PROVIDER_SITE_OTHER): Payer: Self-pay | Admitting: Internal Medicine

## 2019-03-03 ENCOUNTER — Telehealth: Payer: Self-pay | Admitting: Cardiology

## 2019-03-03 ENCOUNTER — Encounter (INDEPENDENT_AMBULATORY_CARE_PROVIDER_SITE_OTHER): Payer: Self-pay | Admitting: *Deleted

## 2019-03-03 NOTE — Telephone Encounter (Signed)
Virtual Visit Pre-Appointment Phone Call  "(Name), I am calling you today to discuss your upcoming appointment. We are currently trying to limit exposure to the virus that causes COVID-19 by seeing patients at home rather than in the office."  1. "What is the BEST phone number to call the day of the visit?" - include this in appointment notes  2. Do you have or have access to (through a family member/friend) a smartphone with video capability that we can use for your visit?" a. If yes - list this number in appt notes as cell (if different from BEST phone #) and list the appointment type as a VIDEO visit in appointment notes b. If no - list the appointment type as a PHONE visit in appointment notes  3. Confirm consent - "In the setting of the current Covid19 crisis, you are scheduled for a (phone or video) visit with your provider on (date) at (time).  Just as we do with many in-office visits, in order for you to participate in this visit, we must obtain consent.  If you'd like, I can send this to your mychart (if signed up) or email for you to review.  Otherwise, I can obtain your verbal consent now.  All virtual visits are billed to your insurance company just like a normal visit would be.  By agreeing to a virtual visit, we'd like you to understand that the technology does not allow for your provider to perform an examination, and thus may limit your provider's ability to fully assess your condition. If your provider identifies any concerns that need to be evaluated in person, we will make arrangements to do so.  Finally, though the technology is pretty good, we cannot assure that it will always work on either your or our end, and in the setting of a video visit, we may have to convert it to a phone-only visit.  In either situation, we cannot ensure that we have a secure connection.  Are you willing to proceed?" STAFF: Did the patient verbally acknowledge consent to telehealth visit? Document  YES/NO here: yes  4. Advise patient to be prepared - "Two hours prior to your appointment, go ahead and check your blood pressure, pulse, oxygen saturation, and your weight (if you have the equipment to check those) and write them all down. When your visit starts, your provider will ask you for this information. If you have an Apple Watch or Kardia device, please plan to have heart rate information ready on the day of your appointment. Please have a pen and paper handy nearby the day of the visit as well."  5. Give patient instructions for MyChart download to smartphone OR Doximity/Doxy.me as below if video visit (depending on what platform provider is using)  6. Inform patient they will receive a phone call 15 minutes prior to their appointment time (may be from unknown caller ID) so they should be prepared to answer    TELEPHONE CALL NOTE  Rebecca Scott has been deemed a candidate for a follow-up tele-health visit to limit community exposure during the Covid-19 pandemic. I spoke with the patient via phone to ensure availability of phone/video source, confirm preferred email & phone number, and discuss instructions and expectations.  I reminded Rebecca Scott to be prepared with any vital sign and/or heart rhythm information that could potentially be obtained via home monitoring, at the time of her visit. I reminded Rebecca Scott to expect a phone call prior to  her visit.  Weston Anna 03/03/2019 2:10 PM   INSTRUCTIONS FOR DOWNLOADING THE MYCHART APP TO SMARTPHONE  - The patient must first make sure to have activated MyChart and know their login information - If Apple, go to [employer] and type in MyChart in the search bar and download the app. If Android, ask patient to go to [employer] and type in [place] in the search bar and download the app. The app is free but as with any other app downloads, their phone may require them to verify saved payment information or  Apple/Android password.  - The patient will need to then log into the app with their MyChart username and password, and select [facility] as their healthcare provider to link the account. When it is time for your visit, go to the MyChart app, find appointments, and click Begin Video Visit. Be sure to Select Allow for your device to access the Microphone and Camera for your visit. You will then be connected, and your provider will be with you shortly.  **If they have any issues connecting, or need assistance please contact MyChart service desk [phone])**  **If using a computer, in order to ensure the best quality for their visit they will need to use either of the following Internet Browsers: [employer], or Google Chrome**  IF USING DOXIMITY or DOXY.ME - The patient will receive a link just prior to their visit by text.     FULL LENGTH CONSENT FOR TELE-HEALTH VISIT   I hereby voluntarily request, consent and authorize [facility] and its employed or contracted physicians, physician assistants, nurse practitioners or other licensed health care professionals (the Practitioner), to provide me with telemedicine health care services (the Services") as deemed necessary by the treating Practitioner. I acknowledge and consent to receive the Services by the Practitioner via telemedicine. I understand that the telemedicine visit will involve communicating with the Practitioner through live audiovisual communication technology and the disclosure of certain medical information by electronic transmission. I acknowledge that I have been given the opportunity to request an in-person assessment or other available alternative prior to the telemedicine visit and am voluntarily participating in the telemedicine visit.  I understand that I have the right to withhold or withdraw my consent to the use of telemedicine in the course of my care at any time, without affecting my right to future care  or treatment, and that the Practitioner or I may terminate the telemedicine visit at any time. I understand that I have the right to inspect all information obtained and/or recorded in the course of the telemedicine visit and may receive copies of available information for a reasonable fee.  I understand that some of the potential risks of receiving the Services via telemedicine include:   Delay or interruption in medical evaluation due to technological equipment failure or disruption;  Information transmitted may not be sufficient (e.g. poor resolution of images) to allow for appropriate medical decision making by the Practitioner; and/or   In rare instances, security protocols could fail, causing a breach of personal health information.  Furthermore, I acknowledge that it is my responsibility to provide information about my medical history, conditions and care that is complete and accurate to the best of my ability. I acknowledge that Practitioner's advice, recommendations, and/or decision may be based on factors not within their control, such as incomplete or inaccurate data provided by me or distortions of diagnostic images or specimens that may result from electronic transmissions. I  understand that the practice of medicine is not an exact science and that Practitioner makes no warranties or guarantees regarding treatment outcomes. I acknowledge that I will receive a copy of this consent concurrently upon execution via email to the email address I last provided but may also request a printed copy by calling the office of [facility].    I understand that my insurance will be billed for this visit.   I have read or had this consent read to me.  I understand the contents of this consent, which adequately explains the benefits and risks of the Services being provided via telemedicine.   I have been provided ample opportunity to ask questions regarding this consent and the Services and have had  my questions answered to my satisfaction.  I give my informed consent for the services to be provided through the use of telemedicine in my medical care  By participating in this telemedicine visit I agree to the above.

## 2019-03-11 ENCOUNTER — Telehealth (INDEPENDENT_AMBULATORY_CARE_PROVIDER_SITE_OTHER): Payer: 59 | Admitting: Cardiology

## 2019-03-11 ENCOUNTER — Encounter

## 2019-03-11 ENCOUNTER — Encounter: Payer: Self-pay | Admitting: Cardiology

## 2019-03-11 VITALS — BP 132/81 | HR 58 | Ht 63.0 in | Wt 211.0 lb

## 2019-03-11 DIAGNOSIS — R55 Syncope and collapse: Secondary | ICD-10-CM | POA: Diagnosis not present

## 2019-03-11 DIAGNOSIS — R002 Palpitations: Secondary | ICD-10-CM

## 2019-03-11 DIAGNOSIS — R600 Localized edema: Secondary | ICD-10-CM | POA: Diagnosis not present

## 2019-03-11 DIAGNOSIS — I951 Orthostatic hypotension: Secondary | ICD-10-CM | POA: Diagnosis not present

## 2019-03-11 MED ORDER — HYDROCHLOROTHIAZIDE 25 MG PO TABS: 12.5000 mg | ORAL_TABLET | Freq: Every day | ORAL | 1 refills | Status: DC

## 2019-03-11 NOTE — Patient Instructions (Signed)
Your physician wants you to follow-up in: [place] will receive a reminder letter in the mail two months in advance. If you don't receive a letter, please call our office to schedule the follow-up appointment.  Your physician has recommended you make the following change in your medication:   CHANGE HYDROCHLOROTHIAZIDE TO 25 MG DAILY   Thank you for choosing [facility]!!

## 2019-03-11 NOTE — Progress Notes (Signed)
Virtual Visit via Telephone Note   This visit type was conducted due to national recommendations for restrictions regarding the COVID-19 Pandemic (e.g. social distancing) in an effort to limit this patient's exposure and mitigate transmission in our community.  Due to her co-morbid illnesses, this patient is at least at moderate risk for complications without adequate follow up.  This format is felt to be most appropriate for this patient at this time.  The patient did not have access to video technology/had technical difficulties with video requiring transitioning to audio format only (telephone).  All issues noted in this document were discussed and addressed.  No physical exam could be performed with this format.  Please refer to the patient's chart for her  consent to telehealth for [facility]   Date:  03/11/2019   ID:  [name], DOB 01-Oct-1955, MRN [id number]  Patient Location: Home Provider Location: Office  PCP:  Janora Norlander, DO  Cardiologist:  Carlyle Dolly, MD  Electrophysiologist:  None   Evaluation Performed:  Follow-Up Visit  Chief Complaint:  Follow up  History of Present Illness:    Rebecca Scott is a 63 y.o. female seen today for follow up of the following medical problems.    1. Syncope/Orthostatic hypotension - she reports approximately 8 episodes starting in July. Over the last few months her husband had taken ill and recently passed away, the onset of her syncopal episodes correlates with recent severe stress related to this.  - first episode July. Occurred just after coming home from hospital with her husband. She was standing make the bed when she lost consciousness. Denies any prodrome. Does not remember any specific symptoms prior. LOC for a few minutes.  - second episode a few weeks later. Occurred while walking out of church helping her husband. Does not recall prodrome at that time either - episode 2 weeks ago while laying in  hospital bed, after standing had episode. At that time had some mild chest pain prior. - completed echo that showed VLEF 55% , grade I diastolic dysfunction, no significant valvular pathology. We also decreased her losartan to 50mg  daily and changed her HCTZ to prn only.   - ER visit 08/2017 with presyncope. - occurred at church. Standing while singing. Felt off balance, unsteady. Went partially to ground.  - water x 4, 1 cup coffee, occasional sodas, no energy drinks, occasional tea, no EtOH. - can have intermittent diarrhea vs constipation. - orthostatic in clinic previuosly, DBP drops 13 points with sitting up.    - no recent symptoms, symptoms much improved overall - staying well hydrated.   2. Chronic pain - followed in pain clinic for right sided chest wall pain.   3. Palpitations - 04/2018 event monitor no significant arrhythmias - palpitations at night mainly since last visit, can keep her from sleeping.   - mild palpitations at night, compliant with lopressor  4. Thyroid CA - followed by endocrine.   5. OSA - followed by Dr Rexene Alberts  6. Chronic GERD/dysphagia - followed by GI   7. LE edema 04/2018 echo LVEF 01-09%, grade I diastolic dysfunction.  - taking HCTZ 25 mg daily.  - difficulty getting stockings on due to back pain.   The patient does not have symptoms concerning for COVID-19 infection (fever, chills, cough, or new shortness of breath).    Past Medical History:  Diagnosis Date  . Anemia   . Anxiety   . Arthritis   . Asthma  seasonal asthma rarely   . Chronic kidney disease    renal mass right kidney   . CPAP (continuous positive airway pressure) dependence   . Diverticulitis   . Family history of cancer of extrahepatic bile ducts   . Family history of colon cancer   . Family history of kidney cancer   . Family history of melanoma   . GERD (gastroesophageal reflux disease)   . Hemorrhoids   . History of kidney stones   . HTN  (hypertension) 07/29/2014  . Hyperlipemia   . Hypertension   . Migraine    no migraines now, will have stress headaches  . Multinodular goiter 08/21/2017   Last Assessment & Plan:  She will need an f/u u/s of thyroid to reassess nodules. Repeat in 07/2018 Nodules are too small for biopsy now.  . 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 Papillary thyroid carcinoma ([place]) 03/10/2018  . PONV (postoperative nausea and vomiting)    patient reports being "slower to wake than average "   . Pre-diabetes    "ive been told i'm borderline"   . Sleep apnea    CPAP use    Past Surgical History:  Procedure Laterality Date  . ABDOMINAL HYSTERECTOMY    . APPENDECTOMY  1978  . BIOPSY  07/08/2017   Procedure: BIOPSY;  Surgeon: Rogene Houston, MD;  Location: [facility];  Service: Endoscopy;;  gastric  . CHOLECYSTECTOMY  2005  . COLONOSCOPY WITH PROPOFOL N/A 10/28/2017   Procedure: COLONOSCOPY WITH PROPOFOL;  Surgeon: Rogene Houston, MD;  Location: [facility];  Service: Endoscopy;  Laterality: N/A;  7:30  . ESOPHAGOGASTRODUODENOSCOPY N/A 07/08/2017   Procedure: ESOPHAGOGASTRODUODENOSCOPY (EGD);  Surgeon: Rogene Houston, MD;  [facility];  Service: Endoscopy;  Laterality: N/A;  220  . LUMBAR LAMINECTOMY/DECOMPRESSION MICRODISCECTOMY N/A 09/26/2018   Procedure: L4-5 MICRODISCECTOMY;  Surgeon: [name], MD;  Location: [place];  Service: Orthopedics;  Laterality: N/A;  . NASAL SEPTUM SURGERY    . ROBOTIC ASSITED PARTIAL NEPHRECTOMY Right 05/17/2017   Procedure: XI ROBOTIC ASSITED PARTIAL NEPHRECTOMY;  Surgeon: Alexis Frock, MD;  Location: [facility];  Service: Urology;  Laterality: Right;  . Ruptured Disk L5    . THYROIDECTOMY N/A 03/10/2018   Procedure: TOTAL THYROIDECTOMY;  Surgeon: Aviva Signs, MD;  Location: [facility];  Service: General;  Laterality: N/A;     No outpatient medications have been marked as taking for the 03/11/19 encounter  (Appointment) with Arnoldo Lenis, MD.     Allergies:   Mango flavor, Iodinated diagnostic agents, Isovue [iopamidol], Nitrofurantoin, Other, Sulfa antibiotics, and Adhesive [tape]   Social History   Tobacco Use  . Smoking status: Never Smoker  . Smokeless tobacco: Never Used  Substance Use Topics  . Alcohol use: Yes    Comment: rarely   . Drug use: No     Family Hx: The patient's family history includes Asthma in her son; Brain cancer in her maternal grandmother; COPD in her mother and sister; Cancer in her cousin; Colon cancer in an other family member; Congenital heart disease in her son; Diabetes in her mother; Heart disease in her maternal grandfather; High Cholesterol in her brother and mother; Hypertension in her brother, brother, and mother; Kidney cancer (age of onset: 77) in her brother; Liver cancer in her mother; Lung disease in her maternal uncle; Melanoma in her maternal aunt.  ROS:   Please see the history of  present illness.     All other systems reviewed and are negative.   Prior CV studies:   The following studies were reviewed today:  Echo 07/2014  Study Conclusions  - Left ventricle: The cavity size was normal. Wall thickness was normal. The estimated ejection fraction was 55%. Wall motion was normal; there were no regional wall motion abnormalities. Doppler parameters are consistent with abnormal left ventricular relaxation (grade 1 diastolic dysfunction). - Aortic valve: There was trivial regurgitation. - Mitral valve: Mildly thickened leaflets . Mild systolic bowing without prolapse, involving the anterior leaflet. There was mild regurgitation directed posteriorly. - Left atrium: The atrium was moderately dilated. - Right atrium: Central venous pressure (est): 3 mm Hg. - Atrial septum: The septum bowed from left to right, consistent with increased left atrial pressure. - Tricuspid valve: There was trivial regurgitation. - Pulmonary  arteries: PA peak pressure: 23 mm Hg (S). - Pericardium, extracardiac: A prominent pericardial fat pad was present.  Impressions:  - Normal LV wall thickness and chamber size with LVEF approximately 46%, grade 1 diastolic dysfunction. Mildly thickened mitral leaflets with bowing of the anterior leaflet and mild posteriorly directed mitral regurgitation. Moderate left atrial enlargement. Trivial tricuspid regurgitation with PASP 23 mmHg.  04/2018 event monitor  7 day event monitor  Min HR 43, Max HR 118, Avg Hr 63. Min HR occurred early AM hours,presumably while sleeping  Reported symptoms correlated with sinus rhythm  No arrhythmias  Labs/Other Tests and Data Reviewed:    EKG:  No ECG reviewed.  Recent Labs: 04/20/2018: Magnesium 2.0 09/24/2018: ALT 22; BUN 11; Creatinine, Ser 0.68; Hemoglobin 11.6; Platelets 153; Potassium 3.1; Sodium 136 01/13/2019: TSH 1.42   Recent Lipid Panel Lab Results  Component Value Date/Time   CHOL 174 09/08/2018 12:35 PM   TRIG 84 09/08/2018 12:35 PM   HDL 58 09/08/2018 12:35 PM   CHOLHDL 3.0 09/08/2018 12:35 PM   LDLCALC 99 09/08/2018 12:35 PM    Wt Readings from Last 3 Encounters:  01/20/19 211 lb 11.2 oz (96 kg)  11/13/18 231 lb (104.8 kg)  10/27/18 213 lb (96.6 kg)     Objective:    Vital Signs:   Today's Vitals   03/11/19 0951  BP: 132/81  Pulse: (!) 58  Weight: 211 lb (95.7 kg)  Height: 5\' 3"  (1.6 m)   Body mass index is 37.38 kg/m.  Normal affect, normal speech pattern and tone. No audible signs of SOB or wheezing. Comfotable, no apparent distress  ASSESSMENT & PLAN:    1. Syncope/Orthostatic hypotension. -no recent symptoms, continue to monitor. Continue aggressive hydration. Trying to limit diuretic best we can, without it she has sever swelling.    2. Palpitations - benign 7 day event monitor - reasonable control of symptoms which occur mainly at night, continue night time lopressor  3. LE edema  - reasonable control, it is a balance with controlling edema and avoiding recurrent orthostatic symptoms  4. HTN - reasonable control, with prior othostatic syncope would be somewhat lenient on bp goals    COVID-19 Education: The signs and symptoms of COVID-19 were discussed with the patient and how to seek care for testing (follow up with PCP or arrange E-visit).  The importance of social distancing was discussed today.  Time:   Today, I have spent 18 minutes with the patient with telehealth technology discussing the above problems.     Medication Adjustments/Labs and Tests Ordered: Current medicines are reviewed at length with the patient today.  Concerns regarding medicines are outlined above.   Tests Ordered: No orders of the defined types were placed in this encounter.   Medication Changes: No orders of the defined types were placed in this encounter.   Follow Up:  In person 6 month f/u  Signed, Carlyle Dolly, MD  03/11/2019 9:07 AM    [place]

## 2019-03-23 ENCOUNTER — Other Ambulatory Visit: Payer: Self-pay | Admitting: Cardiology

## 2019-04-02 ENCOUNTER — Ambulatory Visit (INDEPENDENT_AMBULATORY_CARE_PROVIDER_SITE_OTHER): Payer: 59 | Admitting: Otolaryngology

## 2019-04-07 ENCOUNTER — Telehealth (INDEPENDENT_AMBULATORY_CARE_PROVIDER_SITE_OTHER): Payer: Self-pay | Admitting: *Deleted

## 2019-04-07 NOTE — Telephone Encounter (Signed)
Patient has been made aware that Dr.REhman wants to see her this week , but we would call her back as to what day and time.

## 2019-04-07 NOTE — Telephone Encounter (Signed)
Patient called this morning,feels that she is having a diverticulitis flare. Severe lower abdominal pain , tender to touch. Mucous in stool. When she eats she is having horrible Epigastric pain.

## 2019-04-07 NOTE — Telephone Encounter (Signed)
Per Dr.Rehman may call in Cipro 500 mg and Flagyl 500 mg - patient is to take both by mouth twice daily for 10 days. Patient will need OV with Dr.Rehman either 7/29 or 7/30. Patient called and made aware. Medication was called to the patients pharmacy.

## 2019-04-09 ENCOUNTER — Other Ambulatory Visit: Payer: Self-pay

## 2019-04-09 ENCOUNTER — Encounter (INDEPENDENT_AMBULATORY_CARE_PROVIDER_SITE_OTHER): Payer: Self-pay | Admitting: Internal Medicine

## 2019-04-09 ENCOUNTER — Ambulatory Visit (INDEPENDENT_AMBULATORY_CARE_PROVIDER_SITE_OTHER): Payer: 59 | Admitting: Internal Medicine

## 2019-04-09 DIAGNOSIS — K5732 Diverticulitis of large intestine without perforation or abscess without bleeding: Secondary | ICD-10-CM

## 2019-04-09 NOTE — Patient Instructions (Signed)
Please call office with progress report next week or if pain worsens.

## 2019-04-09 NOTE — Progress Notes (Signed)
Presenting complaint;  Follow-up for diverticulitis.  Database and subjective:  Patient is 63 year old Caucasian female who called office 2 days ago complaining of lower abdominal pain which reminded her of the previous time when she had diverticulitis.  Patient was begun on Cipro and metronidazole and this visit was arranged. Patient states she had first episode of diverticulitis few years ago.  Her second episode occurred in June 2018 when she developed left-sided pain and she thought she had kidney stone.  She was seen in emergency room and CT confirmed diagnosis of sigmoid diverticulitis and also revealed right renal lesion suspicious for renal cell carcinoma and she went on to have robotic partial nephrectomy in September 2018. Patient also has a history of GERD and IBS and chronic right upper quadrant pain which is felt to be neuropathic or musculoskeletal pain.  Patient states she was in usual state of health until the morning of 04/04/2019 when she noted mild discomfort across lower abdomen.  She did not have any other associated symptoms.  However she woke up in the middle of Saturday night with worsening pain and she also had low-grade fever.  By the time she called the office pain was much worse and she noted pain even with coughing. Patient was begun on Cipro and metronidazole. Today she feels much better.  Now she has mild pain which is across lower abdomen when mainly centered in left lower quadrant.  Her bowels remain irregular.  She is not having diarrhea but she goes anywhere from normal to constipation and loose stools.  She is not having any rectal bleeding.  She has had some nausea which she feels is due to antibiotic.  She has not had any vomiting.  No history of antibiotic use within the last few months.  Since his pain started she has been using dicyclomine more frequently.    Current Medications: Outpatient Encounter Medications as of 04/09/2019  Medication Sig  . aspirin EC  81 MG tablet Take 81 mg by mouth daily.  . ciprofloxacin (CIPRO) 500 MG tablet Take 500 mg by mouth 2 (two) times daily. For 10 days  . clonazePAM (KLONOPIN) 0.5 MG tablet Take 0.5 mg by mouth 2 (two) times daily as needed for anxiety.   . dicyclomine (BENTYL) 10 MG capsule Take 1-2 capsules (10-20 mg total) by mouth See admin instructions. Take 1 capsule (10 mg) by mouth in the morning, take 2 capsules (20 mg) by mouth at noon, & 1 capsule (10 mg) by mouth at bedtime.  Marland Kitchen GLUCOSAMINE-CHONDROITIN PO Take by mouth daily.  . hydrochlorothiazide (HYDRODIURIL) 25 MG tablet Take 0.5 tablets (12.5 mg total) by mouth daily.  Marland Kitchen HYDROcodone-acetaminophen (NORCO/VICODIN) 5-325 MG tablet Take 1 tablet by mouth every 6 (six) hours as needed for moderate pain.  Marland Kitchen levothyroxine (SYNTHROID) 100 MCG tablet Take 1 tablet (100 mcg total) by mouth daily before breakfast.  . losartan (COZAAR) 100 MG tablet TAKE 1 TABLET ONCE DAILY.  . methocarbamol (ROBAXIN) 500 MG tablet Take 1 tablet (500 mg total) by mouth every 8 (eight) hours as needed for muscle spasms.  . metoprolol tartrate (LOPRESSOR) 25 MG tablet TAKE 1/2 TABLET BY MOUTH AT BEDTIME.  . metroNIDAZOLE (FLAGYL) 500 MG tablet Take 500 mg by mouth 2 (two) times daily. For 10 days  . Multiple Vitamin (MULTI-DAY PO) Take by mouth daily.  . pantoprazole (PROTONIX) 40 MG tablet TAKE 1 TABLET DAILY BEFORE BREAKFAST.  Marland Kitchen potassium chloride SA (K-DUR,KLOR-CON) 20 MEQ tablet TAKE 1 TABLET TWICE  DAILY FOR 3 DAYS THEN 1 TABLET DAILY.  . rosuvastatin (CRESTOR) 5 MG tablet TAKE 1 TABLET BY MOUTH ONCE DAILY.   No facility-administered encounter medications on file as of 04/09/2019.      Objective: Blood pressure 119/77, pulse (!) 57, temperature 98.4 F (36.9 C), temperature source Oral, resp. rate 18, height 5\' 8"  (1.727 m), weight 208 lb 4.8 oz (94.5 kg). Patient is alert and in no acute distress. Conjunctiva is pink. Sclera is nonicteric Oropharyngeal mucosa is  normal. No masses or lymphadenopathy.  She has lower neck scar from previous thyroid surgery. Cardiac exam with regular rhythm normal S1 and S2. No murmur or gallop noted. Lungs are clear to auscultation. Abdomen is full.  Bowel sounds are normal.  On palpation abdomen is soft.  She has mild tenderness across lower abdomen but is more pronounced in left lower quadrant without guarding.  No organomegaly or masses. No LE edema or clubbing noted.   Assessment:  Sigmoid diverticulitis.  She was begun on antibiotic therapy 2 days ago and she is already feeling better.  This is the third episode.  She has not required hospitalization for 2 prior episodes although she was seen in the emergency room with a second episode which was well documented on CT.  No obvious triggers.  She will continue antibiotic therapy for for 10 days.  Plan:  Patient advised to call office if pain is not completely resolved by the time she finishes antibiotic or if pain worsens. She has office visit scheduled on 07/28/2019.

## 2019-04-18 ENCOUNTER — Telehealth: Payer: Self-pay | Admitting: Family Medicine

## 2019-04-18 NOTE — Telephone Encounter (Signed)
**  [place] After Hours/ Emergency Line Call**  Patient: Rebecca Scott.  PCP: Janora Norlander, DO  Endorsing rhinorrhea and nasal congestion.  Denying Cough, shortness of breath.  She has been self treating for allergies with Zyrtec and Tylenol.  Reports temperature to 99.45F at home.    Recommended that she start Flonase nasal spray and sinus rinses.  Ok to continue zyrtec.  Red flags discussed.  If no improvement over weekend or worsening, would recommend COVID19 testing and empiric treatment for sinus infection w/ antibiotics.    Ashly M. Lajuana Ripple, DO

## 2019-04-27 ENCOUNTER — Inpatient Hospital Stay (HOSPITAL_COMMUNITY): Payer: 59 | Attending: Hematology

## 2019-04-27 ENCOUNTER — Other Ambulatory Visit: Payer: Self-pay

## 2019-04-27 DIAGNOSIS — Z79899 Other long term (current) drug therapy: Secondary | ICD-10-CM | POA: Insufficient documentation

## 2019-04-27 DIAGNOSIS — Z85528 Personal history of other malignant neoplasm of kidney: Secondary | ICD-10-CM | POA: Diagnosis not present

## 2019-04-27 DIAGNOSIS — E785 Hyperlipidemia, unspecified: Secondary | ICD-10-CM | POA: Diagnosis not present

## 2019-04-27 DIAGNOSIS — Z7982 Long term (current) use of aspirin: Secondary | ICD-10-CM | POA: Insufficient documentation

## 2019-04-27 DIAGNOSIS — Z800 Family history of malignant neoplasm of digestive organs: Secondary | ICD-10-CM | POA: Insufficient documentation

## 2019-04-27 DIAGNOSIS — I10 Essential (primary) hypertension: Secondary | ICD-10-CM | POA: Insufficient documentation

## 2019-04-27 DIAGNOSIS — C73 Malignant neoplasm of thyroid gland: Secondary | ICD-10-CM | POA: Diagnosis not present

## 2019-04-27 DIAGNOSIS — D649 Anemia, unspecified: Secondary | ICD-10-CM | POA: Insufficient documentation

## 2019-04-27 DIAGNOSIS — Z841 Family history of disorders of kidney and ureter: Secondary | ICD-10-CM | POA: Diagnosis not present

## 2019-04-27 DIAGNOSIS — Z905 Acquired absence of kidney: Secondary | ICD-10-CM | POA: Diagnosis not present

## 2019-04-27 DIAGNOSIS — Z90710 Acquired absence of both cervix and uterus: Secondary | ICD-10-CM | POA: Diagnosis not present

## 2019-04-27 LAB — CBC WITH DIFFERENTIAL/PLATELET
Abs Immature Granulocytes: 0.02 10*3/uL (ref 0.00–0.07)
Basophils Absolute: 0 10*3/uL (ref 0.0–0.1)
Basophils Relative: 1 %
Eosinophils Absolute: 0.2 10*3/uL (ref 0.0–0.5)
Eosinophils Relative: 4 %
HCT: 34.5 % — ABNORMAL LOW (ref 36.0–46.0)
Hemoglobin: 10.7 g/dL — ABNORMAL LOW (ref 12.0–15.0)
Immature Granulocytes: 0 %
Lymphocytes Relative: 28 %
Lymphs Abs: 1.5 10*3/uL (ref 0.7–4.0)
MCH: 28 pg (ref 26.0–34.0)
MCHC: 31 g/dL (ref 30.0–36.0)
MCV: 90.3 fL (ref 80.0–100.0)
Monocytes Absolute: 0.4 10*3/uL (ref 0.1–1.0)
Monocytes Relative: 7 %
Neutro Abs: 3.2 10*3/uL (ref 1.7–7.7)
Neutrophils Relative %: 60 %
Platelets: 177 10*3/uL (ref 150–400)
RBC: 3.82 MIL/uL — ABNORMAL LOW (ref 3.87–5.11)
RDW: 15.9 % — ABNORMAL HIGH (ref 11.5–15.5)
WBC: 5.4 10*3/uL (ref 4.0–10.5)
nRBC: 0 % (ref 0.0–0.2)

## 2019-04-27 LAB — COMPREHENSIVE METABOLIC PANEL
ALT: 37 U/L (ref 0–44)
AST: 40 U/L (ref 15–41)
Albumin: 4.1 g/dL (ref 3.5–5.0)
Alkaline Phosphatase: 89 U/L (ref 38–126)
Anion gap: 8 (ref 5–15)
BUN: 11 mg/dL (ref 8–23)
CO2: 23 mmol/L (ref 22–32)
Calcium: 9.2 mg/dL (ref 8.9–10.3)
Chloride: 108 mmol/L (ref 98–111)
Creatinine, Ser: 0.77 mg/dL (ref 0.44–1.00)
GFR calc Af Amer: >60 mL/min (ref >60)
GFR calc non Af Amer: >60 mL/min (ref >60)
Glucose, Bld: 116 mg/dL — ABNORMAL HIGH (ref 70–99)
Potassium: 3.6 mmol/L (ref 3.5–5.1)
Sodium: 139 mmol/L (ref 135–145)
Total Bilirubin: 0.5 mg/dL (ref 0.3–1.2)
Total Protein: 7.3 g/dL (ref 6.5–8.1)

## 2019-04-27 LAB — LACTATE DEHYDROGENASE: LDH: 213 U/L — ABNORMAL HIGH (ref 98–192)

## 2019-04-27 LAB — MAGNESIUM: Magnesium: 2.2 mg/dL (ref 1.7–2.4)

## 2019-04-27 LAB — TSH: TSH: 9.839 u[IU]/mL — ABNORMAL HIGH (ref 0.350–4.500)

## 2019-05-04 ENCOUNTER — Other Ambulatory Visit: Payer: Self-pay | Admitting: "Endocrinology

## 2019-05-04 ENCOUNTER — Ambulatory Visit (INDEPENDENT_AMBULATORY_CARE_PROVIDER_SITE_OTHER): Payer: 59 | Admitting: Otolaryngology

## 2019-05-04 ENCOUNTER — Other Ambulatory Visit: Payer: Self-pay

## 2019-05-04 ENCOUNTER — Inpatient Hospital Stay (HOSPITAL_BASED_OUTPATIENT_CLINIC_OR_DEPARTMENT_OTHER): Payer: 59 | Admitting: Hematology

## 2019-05-04 ENCOUNTER — Telehealth: Payer: Self-pay | Admitting: "Endocrinology

## 2019-05-04 ENCOUNTER — Encounter (HOSPITAL_COMMUNITY): Payer: Self-pay | Admitting: Hematology

## 2019-05-04 VITALS — BP 117/60 | HR 56 | Temp 98.2°F | Resp 16 | Wt 203.7 lb

## 2019-05-04 DIAGNOSIS — C73 Malignant neoplasm of thyroid gland: Secondary | ICD-10-CM | POA: Diagnosis not present

## 2019-05-04 DIAGNOSIS — R1312 Dysphagia, oropharyngeal phase: Secondary | ICD-10-CM

## 2019-05-04 DIAGNOSIS — Z85850 Personal history of malignant neoplasm of thyroid: Secondary | ICD-10-CM | POA: Diagnosis not present

## 2019-05-04 DIAGNOSIS — K219 Gastro-esophageal reflux disease without esophagitis: Secondary | ICD-10-CM | POA: Diagnosis not present

## 2019-05-04 DIAGNOSIS — Z85528 Personal history of other malignant neoplasm of kidney: Secondary | ICD-10-CM | POA: Diagnosis not present

## 2019-05-04 MED ORDER — PREDNISONE 50 MG PO TABS: ORAL_TABLET | ORAL | 0 refills | Status: DC

## 2019-05-04 MED ORDER — DIPHENHYDRAMINE HCL 50 MG PO TABS: 50.0000 mg | ORAL_TABLET | Freq: Once | ORAL | 0 refills | Status: DC

## 2019-05-04 MED ORDER — LEVOTHYROXINE SODIUM 125 MCG PO TABS: 125.0000 ug | ORAL_TABLET | Freq: Every day | ORAL | 3 refills | Status: DC

## 2019-05-04 NOTE — Assessment & Plan Note (Addendum)
1.  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 - CT CAP on 10/24/2018 shows no evidence of recurrence or metastatic disease. - She will have surveillance visits with history and physical exam every 6 months and CT of the abdomen and pelvis once a year. - She will come back in 6 months with repeat scans.  We reviewed blood work which was grossly within normal limits.  2.  Thyroid cancer: -He underwent thyroidectomy and radioactive iodine treatment.  She is on Synthroid 100 mcg daily. -We reviewed her TSH which was elevated at 9.  I have asked her to follow-up with Dr. Dorris Fetch.  3.  Family history: - Because of her family history, I have made a referral to genetic counselor.  She did not have any actionable mutations.  4.  Normocytic anemia: -Hemoglobin is 10.7.  Will consider doing ferritin, iron panel, 123456 and folic acid at next visit.

## 2019-05-04 NOTE — Telephone Encounter (Signed)
Pt.notified

## 2019-05-04 NOTE — Telephone Encounter (Signed)
Pt is requesting that Dr Dorris Fetch review last TSH result. She wants to know if she needs to change anything

## 2019-05-04 NOTE — Telephone Encounter (Signed)
Yes, she will need higher dose of LT4. I will send in a rx to her pharmacy. She can finish her current supplies by taking 1 and 1/2 pills daily before BKF.

## 2019-05-04 NOTE — Patient Instructions (Addendum)
[facility] Discharge Instructions  You were seen today by Dr. Katragadda. He went over your recent lab results. He will see you back in 6 months for labs, scan and follow up.   Thank you for choosing [facility] at [facility] to provide your oncology and hematology care.  To afford each patient quality time with our provider, please arrive at least 15 minutes before your scheduled appointment time.   If you have a lab appointment with the Cancer Center please come in thru the  Main Entrance and check in at the main information desk  You need to re-schedule your appointment should you arrive 10 or more minutes late.  We strive to give you quality time with our providers, and arriving late affects you and other patients whose appointments are after yours.  Also, if you no show three or more times for appointments you may be dismissed from the clinic at the providers discretion.     Again, thank you for choosing [facility].  Our hope is that these requests will decrease the amount of time that you wait before being seen by our physicians.       _____________________________________________________________  Should you have questions after your visit to [facility], please contact our office at [phone] between the hours of 8:00 a.m. and 4:30 p.m.  Voicemails left after 4:00 p.m. will not be returned until the following business day.  For prescription refill requests, have your pharmacy contact our office and allow 72 hours.    Cancer Center Support Programs:   > Cancer Support Group  2nd Tuesday of the month 1pm-2pm, Journey Room    

## 2019-05-04 NOTE — Progress Notes (Signed)
[place], [place] [address]   CLINIC:  Medical Oncology/Hematology  PCP:  Janora Norlander, DO [place] [address] [phone]   REASON FOR VISIT: Follow-up for right kidney cancer and new diagnosis of thyroid cancer  CURRENT THERAPY: surveillance    INTERVAL HISTORY:  [name] 63 y.o. female seen for follow-up of kidney cancer and thyroid cancer.  She also has extensive family history of other malignancies.  Appetite is 75%.  Energy levels are low.  Mild fatigue is stable.  Swelling of the right leg is also stable.  Numbness in the right leg is also stable.  Denies any fevers, night sweats or weight loss.  She does report IBS with alternating constipation and diarrhea.  No ER visits or hospitalizations.   REVIEW OF SYSTEMS:  Review of Systems  Cardiovascular: Positive for leg swelling.  Gastrointestinal: Positive for constipation and diarrhea.  Neurological: Positive for numbness.  All other systems reviewed and are negative.    PAST MEDICAL/SURGICAL HISTORY:  Past Medical History:  Diagnosis Date  . Anemia   . Anxiety   . Arthritis   . Asthma    seasonal asthma rarely   . Chronic kidney disease    renal mass right kidney   . CPAP (continuous positive airway pressure) dependence   . Diverticulitis   . Family history of cancer of extrahepatic bile ducts   . Family history of colon cancer   . Family history of kidney cancer   . Family history of melanoma   . GERD (gastroesophageal reflux disease)   . Hemorrhoids   . History of kidney stones   . HTN (hypertension) 07/29/2014  . Hyperlipemia   . Hypertension   . Migraine    no migraines now, will have stress headaches  . Multinodular goiter 08/21/2017   Last Assessment & Plan:  She will need an f/u u/s of thyroid to reassess nodules. Repeat in 07/2018 Nodules are too small for biopsy now.  . 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 Papillary thyroid carcinoma ([facility]) 03/10/2018  . PONV (postoperative nausea and vomiting)    patient reports being "slower to wake than average "   . Pre-diabetes    "ive been told i'm borderline"   . Sleep apnea    CPAP use    Past Surgical History:  Procedure Laterality Date  . ABDOMINAL HYSTERECTOMY    . APPENDECTOMY  1978  . BIOPSY  07/08/2017   Procedure: BIOPSY;  Surgeon: Rogene Houston, MD;  Location: [facility];  Service: Endoscopy;;  gastric  . CHOLECYSTECTOMY  2005  . COLONOSCOPY WITH PROPOFOL N/A 10/28/2017   Procedure: COLONOSCOPY WITH PROPOFOL;  Surgeon: Rogene Houston, MD;  [facility]: [facility];  Service: Endoscopy;  Laterality: N/A;  7:30  . ESOPHAGOGASTRODUODENOSCOPY N/A 07/08/2017   Procedure: ESOPHAGOGASTRODUODENOSCOPY (EGD);  Surgeon: Rogene Houston, MD;  [facility]: [facility];  Service: Endoscopy;  Laterality: N/A;  220  . LUMBAR LAMINECTOMY/DECOMPRESSION MICRODISCECTOMY N/A 09/26/2018   Procedure: L4-5 MICRODISCECTOMY;  Surgeon: [name], MD;  Location: [facility];  Service: Orthopedics;  Laterality: N/A;  . NASAL SEPTUM SURGERY    . ROBOTIC ASSITED PARTIAL NEPHRECTOMY Right 05/17/2017   Procedure: XI ROBOTIC ASSITED PARTIAL NEPHRECTOMY;  Surgeon: Alexis Frock, MD;  Location: [facility];  Service: Urology;  Laterality: Right;  . Ruptured Disk L5    . THYROIDECTOMY N/A 03/10/2018  Procedure: TOTAL THYROIDECTOMY;  Surgeon: Aviva Signs, MD;  Location: [facility];  Service: General;  Laterality: N/A;     SOCIAL HISTORY:  Social History   Socioeconomic History  . Marital status: Widowed    Spouse name: Not on file  . Number of children: Not on file  . Years of education: Not on file  . Highest education level: Not on file  Occupational History  . Not on file  Social Needs  . Financial resource strain: Not on file  . Food insecurity    Worry: Not on file    Inability: Not on file  . Transportation needs    Medical: Not on file     Non-medical: Not on file  Tobacco Use  . Smoking status: Never Smoker  . Smokeless tobacco: Never Used  Substance and Sexual Activity  . Alcohol use: Yes    Comment: rarely   . Drug use: No  . Sexual activity: Not Currently  Lifestyle  . Physical activity    Days per week: Not on file    Minutes per session: Not on file  . Stress: Not on file  Relationships  . Social Herbalist on phone: Not on file    Gets together: Not on file    Attends religious service: Not on file    Active member of club or organization: Not on file    Attends meetings of clubs or organizations: Not on file    Relationship status: Not on file  . Intimate partner violence    Fear of current or ex partner: Not on file    Emotionally abused: Not on file    Physically abused: Not on file    Forced sexual activity: Not on file  Other Topics Concern  . Not on file  Social History Narrative  . Not on file    FAMILY HISTORY:  Family History  Problem Relation Age of Onset  . COPD Mother   . Diabetes Mother   . Hypertension Mother   . High Cholesterol Mother   . Liver cancer Mother        bile duct cancer  . COPD Sister   . Hypertension Brother   . High Cholesterol Brother   . Kidney cancer Brother 24  . Hypertension Brother   . Asthma Son   . Congenital heart disease Son        hypoplastic left heart syndrome  . Melanoma Maternal Aunt        dx in her 7s  . Brain cancer Maternal Grandmother        ? benign  . Heart disease Maternal Grandfather   . Colon cancer Other   . Lung disease Maternal Uncle   . Cancer Cousin        ? lunc cancer; mother's mat first cousin    CURRENT MEDICATIONS:  Outpatient Encounter Medications as of 05/04/2019  Medication Sig Note  . aspirin EC 81 MG tablet Take 81 mg by mouth daily.   . clonazePAM (KLONOPIN) 0.5 MG tablet Take 0.5 mg by mouth 2 (two) times daily as needed for anxiety.    . dicyclomine (BENTYL) 10 MG capsule Take 1-2 capsules (10-20  mg total) by mouth See admin instructions. Take 1 capsule (10 mg) by mouth in the morning, take 2 capsules (20 mg) by mouth at noon, & 1 capsule (10 mg) by mouth at bedtime.   . diphenhydrAMINE (BENADRYL) 50 MG tablet Take 1 tablet (50 mg  total) by mouth once for 1 dose. Take 50mg  1 hour before your scan   . GLUCOSAMINE-CHONDROITIN PO Take 1 capsule by mouth daily.    . hydrochlorothiazide (HYDRODIURIL) 25 MG tablet Take 0.5 tablets (12.5 mg total) by mouth daily.   Marland Kitchen HYDROcodone-acetaminophen (NORCO/VICODIN) 5-325 MG tablet Take 1 tablet by mouth every 6 (six) hours as needed for moderate pain.   Marland Kitchen losartan (COZAAR) 100 MG tablet TAKE 1 TABLET ONCE DAILY.   . methocarbamol (ROBAXIN) 500 MG tablet Take 1 tablet (500 mg total) by mouth every 8 (eight) hours as needed for muscle spasms.   . metoprolol tartrate (LOPRESSOR) 25 MG tablet TAKE 1/2 TABLET BY MOUTH AT BEDTIME.   . Multiple Vitamin (MULTI-DAY PO) Take by mouth daily.   . pantoprazole (PROTONIX) 40 MG tablet TAKE 1 TABLET DAILY BEFORE BREAKFAST.   Marland Kitchen potassium chloride SA (K-DUR,KLOR-CON) 20 MEQ tablet TAKE 1 TABLET TWICE DAILY FOR 3 DAYS THEN 1 TABLET DAILY. 01/20/2019: Patient is taking 1 daily.  . predniSONE (DELTASONE) 50 MG tablet Take 50mg  13 hours, 7 hours and 1 hour before your scan.   . rosuvastatin (CRESTOR) 5 MG tablet TAKE 1 TABLET BY MOUTH ONCE DAILY.   . [DISCONTINUED] ciprofloxacin (CIPRO) 500 MG tablet Take 500 mg by mouth 2 (two) times daily. For 10 days   . [DISCONTINUED] levothyroxine (SYNTHROID) 100 MCG tablet Take 1 tablet (100 mcg total) by mouth daily before breakfast.   . [DISCONTINUED] metroNIDAZOLE (FLAGYL) 500 MG tablet Take 500 mg by mouth 2 (two) times daily. For 10 days    No facility-administered encounter medications on file as of 05/04/2019.     ALLERGIES:  Allergies  Allergen Reactions  . Mango Flavor Swelling and Rash    Tongue swelling, feels like throat is closing in on her   . Iodinated Diagnostic  Agents Hives and Itching  . Isovue [Iopamidol] Hives and Itching    And hives   . Nitrofurantoin Hives  . Other Hives, Itching, Rash and Other (See Comments)    Use Paper tape ONLY  . Sulfa Antibiotics Hives  . Adhesive [Tape] Rash and Other (See Comments)    Use Paper tape ONLY     PHYSICAL EXAM:  ECOG Performance status: 1  Vitals:   05/04/19 1133  BP: 117/60  Pulse: (!) 56  Resp: 16  Temp: 98.2 F (36.8 C)  SpO2: 99%   Filed Weights   05/04/19 1133  Weight: 203 lb 11.2 oz (92.4 kg)    Physical Exam Constitutional:      Appearance: Normal appearance. She is normal weight.  Cardiovascular:     Rate and Rhythm: Normal rate and regular rhythm.     Heart sounds: Normal heart sounds.  Pulmonary:     Effort: Pulmonary effort is normal.     Breath sounds: Normal breath sounds.  Abdominal:     General: Abdomen is flat.     Palpations: Abdomen is soft.  Musculoskeletal: Normal range of motion.     Comments: With walker  Skin:    General: Skin is warm and dry.  Neurological:     Mental Status: She is alert and oriented to person, place, and time. Mental status is at baseline.  Psychiatric:        Mood and Affect: Mood normal.        Behavior: Behavior normal.        Thought Content: Thought content normal.        Judgment: Judgment normal.  LABORATORY DATA:  I have reviewed the labs as listed.  CBC    Component Value Date/Time   WBC 5.4 04/27/2019 1300   RBC 3.82 (L) 04/27/2019 1300   HGB 10.7 (L) 04/27/2019 1300   HGB 11.7 09/08/2018 1235   HCT 34.5 (L) 04/27/2019 1300   HCT 35.9 09/08/2018 1235   PLT 177 04/27/2019 1300   PLT 146 (L) 09/08/2018 1235   MCV 90.3 04/27/2019 1300   MCV 89 09/08/2018 1235   MCH 28.0 04/27/2019 1300   MCHC 31.0 04/27/2019 1300   RDW 15.9 (H) 04/27/2019 1300   RDW 14.2 09/08/2018 1235   LYMPHSABS 1.5 04/27/2019 1300   MONOABS 0.4 04/27/2019 1300   EOSABS 0.2 04/27/2019 1300   BASOSABS 0.0 04/27/2019 1300   CMP  Latest Ref Rng & Units 04/27/2019 09/24/2018 09/20/2018  Glucose 70 - 99 mg/dL 116(H) 104(H) 155(H)  BUN 8 - 23 mg/dL 11 11 12   Creatinine 0.44 - 1.00 mg/dL 0.77 0.68 0.60  Sodium 135 - 145 mmol/L 139 136 140  Potassium 3.5 - 5.1 mmol/L 3.6 3.1(L) 3.9  Chloride 98 - 111 mmol/L 108 102 109  CO2 22 - 32 mmol/L 23 26 -  Calcium 8.9 - 10.3 mg/dL 9.2 8.8(L) -  Total Protein 6.5 - 8.1 g/dL 7.3 6.5 -  Total Bilirubin 0.3 - 1.2 mg/dL 0.5 0.5 -  Alkaline Phos 38 - 126 U/L 89 67 -  AST 15 - 41 U/L 40 20 -  ALT 0 - 44 U/L 37 22 -       DIAGNOSTIC IMAGING:  I have independently reviewed the scans and discussed with the patient.     ASSESSMENT & PLAN:   History of renal cell carcinoma 1.  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 - CT CAP on 10/24/2018 shows no evidence of recurrence or metastatic disease. - She will have surveillance visits with history and physical exam every 6 months and CT of the abdomen and pelvis once a year. - She will come back in 6 months with repeat scans.  We reviewed blood work which was grossly within normal limits.  2.  Thyroid cancer: -He underwent thyroidectomy and radioactive iodine treatment.  She is on Synthroid 100 mcg daily. -We reviewed her TSH which was elevated at 9.  I have asked her to follow-up with Dr. Dorris Fetch.  3.  Family history: - Because of her family history, I have made a referral to genetic counselor.  She did not have any actionable mutations.  4.  Normocytic anemia: -Hemoglobin is 10.7.  Will consider doing ferritin, iron panel, [id number] and folic acid at next visit.  Total time spent is 25 minutes with more than 50% of the time spent face-to-face discussing surveillance plan, counseling and coordination of care.    Orders placed this encounter:  Orders Placed This Encounter  Procedures  . CT Abdomen Pelvis W Contrast  . CBC with  Differential/Platelet  . Comprehensive metabolic panel  . Iron and TIBC  . Ferritin  . Vitamin B12  . Folate  . Lactate dehydrogenase  . TSH      Derek Jack, [place] [phone]

## 2019-05-18 ENCOUNTER — Encounter: Payer: Self-pay | Admitting: Neurology

## 2019-05-19 ENCOUNTER — Other Ambulatory Visit: Payer: Self-pay | Admitting: Cardiology

## 2019-05-19 ENCOUNTER — Other Ambulatory Visit (INDEPENDENT_AMBULATORY_CARE_PROVIDER_SITE_OTHER): Payer: Self-pay | Admitting: Internal Medicine

## 2019-05-20 ENCOUNTER — Encounter: Payer: Self-pay | Admitting: Neurology

## 2019-05-20 ENCOUNTER — Other Ambulatory Visit: Payer: Self-pay

## 2019-05-20 ENCOUNTER — Ambulatory Visit (INDEPENDENT_AMBULATORY_CARE_PROVIDER_SITE_OTHER): Payer: 59 | Admitting: Neurology

## 2019-05-20 VITALS — BP 129/79 | HR 54 | Ht 63.0 in | Wt 204.0 lb

## 2019-05-20 DIAGNOSIS — G4733 Obstructive sleep apnea (adult) (pediatric): Secondary | ICD-10-CM | POA: Diagnosis not present

## 2019-05-20 DIAGNOSIS — Z9989 Dependence on other enabling machines and devices: Secondary | ICD-10-CM

## 2019-05-20 NOTE — Progress Notes (Signed)
Subjective:    Patient ID: Rebecca Scott is a 63 y.o. female.  HPI     Interim history:   Rebecca Scott is a 63 year old right-handed woman with an underlying medical history of prediabetes, papillary thyroid cancer, migraine headaches, hypertension, hyperlipidemia, history of kidney stones, reflux disease, diverticulitis, asthma, arthritis, anemia, anxiety, and obesity, who presents for follow-up consultation of her obstructive sleep apnea, after recent home sleep testing and starting AutoPap therapy.  The patient is unaccompanied today.  I first met her on 01/06/2019 and a virtual visit at the request of her primary care physician, at which time she reported a prior diagnosis of obstructive sleep apnea.  She was on CPAP of 8 cm via nasal pillows at the time.  She was on her second machine at the time and it was about 62 years old.  She was advised to proceed with reevaluation in the form of home sleep testing and was advised that she was likely eligible for a new machine.  She had a home sleep test on 02/11/2019 which indicated moderate obstructive sleep apnea by a number of events with an AHI of 15.5/h, O2 nadir of 91%.  She was advised to proceed with AutoPap therapy in the midst of the COVID-19 pandemic.  Today, 05/20/2019: I reviewed her AutoPap compliance data from 04/19/2019 through 05/18/2019 which is a total of 30 days, during which time she used her machine 24 days with percent use days greater than 4 hours at 80%, indicating very good compliance with an average usage of 9 hours and 58 minutes, residual AHI at goal at 0.3/h, leak on the low side with a 95th percentile at 4.2 L/min, pressure range of 7 cm to 11 cm, 95th percentile of pressure at 10.6 cm, EPR of 1.  She reports that she has used her CPAP machine for backup as she was going back and forth [facility] in [place].  She has closed on her home in [place] and has successfully moved to [facility] to be closer to her only living son and his young  family which includes a 30-year-old and a 97-monthold.  She has settled in fairly well.  She admits that she has been sleeping more lately, reports that her thyroid numbers were not so good lately, TSH was elevated at 9.8 per her report, she had an increase in her Synthroid per her endocrinologist.  She [place] some point see a new endocrinologist but she will keep going to her current endocrinologist and has a follow-up in November.  [facility] and is using a nasal mask successfully.  She notices an improvement in her daytime energy since she started the new machine and tolerates AutoPap fairly well.  The patient's allergies, current medications, family history, past medical history, past social history, past surgical history and problem list were reviewed and updated as appropriate.   Previously:   01/06/2019: I am conducting a virtual, video based new patient visit via Webex in lieu of a face-to-face visit for evaluation of her sleep disorder, in particular, reevaluation of her prior diagnosis of obstructive sleep apnea. The patient is unaccompanied today and joins via cell phone from home. She is referred by Dr. ARonnie Dossand I reviewed her phone visit note from 12/22/18. She reports difficulty maintaining sleep, nonrestorative sleep, some residual sleep apnea type symptoms. She reports compliance with her CPAP. Her current  machine is set to a pressure of 8 cm, she uses nasal pillows, DME company is [employer] in [place]. She received a new machine in 2015, her model is ResMed escape auto.  She was diagnosed with obstructive sleep apnea nearly 15 years ago, in 2006 and she has been on CPAP therapy. Her machine is about 62 years old, she is on her second machine. Prior sleep study results were reviewed from 05/21/2005. She had a split-night sleep study at the time. Her baseline RDI was  32 per hour, O2 nadir 82%. She had good control with a CPAP of 8 cm. A CPAP compliance download is not possible today. Her Epworth sleepiness score is 11 out of 24, fatigue severity score is 51 out of 63. Her bedtime is between 10 and 11 and rise time between 8 and 10. She does not have night to night nocturia. She gained weight after her cancer surgeries. She had a partial right nephrectomy and a thyroidectomy. She was diagnosed with asthma some years ago and was also told that she had reduced tonsils as in a naturally shrunk away. She did not actually have a tonsillectomy. She is not aware of any family history of OSA. She lives alone, is widowed, has adult children. She has no pets in the household. She drinks caffeine limitation, 1 cup of coffee per day on average, she is a nonsmoker does not currently utilize any alcohol. She has a TV in the bedroom but does not typically watch it at night. She reports full compliance with her CPAP but wonders if the settings need to be changed. She is on disability, previously worked as an Product/process development scientist.   Her Past Medical History Is Significant For: Past Medical History:  Diagnosis Date  . Anemia   . Anxiety   . Arthritis   . Asthma    seasonal asthma rarely   . Chronic kidney disease    renal mass right kidney   . CPAP (continuous positive airway pressure) dependence   . Diverticulitis   . Family history of cancer of extrahepatic bile ducts   . Family history of colon cancer   . Family history of kidney cancer   . Family history of melanoma   . GERD (gastroesophageal reflux disease)   . Hemorrhoids   . History of kidney stones   . HTN (hypertension) 07/29/2014  . Hyperlipemia   . Hypertension   . Migraine    no migraines now, will have stress headaches  . Multinodular goiter 08/21/2017   Last Assessment & Plan:  She will need an f/u u/s of thyroid to reassess nodules. Repeat in 07/2018 Nodules are too small for biopsy now.   . Papillary renal cell carcinoma ([place]) 05/2017   s/p partial nephrectomy, right  . Papillary thyroid carcinoma ([place]) 03/10/2018  . PONV (postoperative nausea and vomiting)    patient reports being "slower to wake than average "   . Pre-diabetes    "ive been told i'm borderline"   . Sleep apnea    CPAP use     Her Past Surgical History Is Significant For: Past Surgical History:  Procedure Laterality Date  . ABDOMINAL HYSTERECTOMY    . APPENDECTOMY  1978  . BIOPSY  07/08/2017   Procedure: BIOPSY;  Surgeon: Rogene Houston, MD;  [facility];  Service: Endoscopy;;  gastric  . CHOLECYSTECTOMY  2005  . COLONOSCOPY WITH PROPOFOL N/A 10/28/2017   Procedure: COLONOSCOPY WITH PROPOFOL;  Surgeon:  Rogene Houston, MD;  [facility];  Service: Endoscopy;  Laterality: N/A;  7:30  . ESOPHAGOGASTRODUODENOSCOPY N/A 07/08/2017   Procedure: ESOPHAGOGASTRODUODENOSCOPY (EGD);  Surgeon: Rogene Houston, MD;  [facility];  Service: Endoscopy;  Laterality: N/A;  220  . LUMBAR LAMINECTOMY/DECOMPRESSION MICRODISCECTOMY N/A 09/26/2018   Procedure: L4-5 MICRODISCECTOMY;  Surgeon: [name], MD;  Location: [facility];  Service: Orthopedics;  Laterality: N/A;  . NASAL SEPTUM SURGERY    . ROBOTIC ASSITED PARTIAL NEPHRECTOMY Right 05/17/2017   Procedure: XI ROBOTIC ASSITED PARTIAL NEPHRECTOMY;  Surgeon: Alexis Frock, MD;  Location: [facility];  Service: Urology;  Laterality: Right;  . Ruptured Disk L5    . THYROIDECTOMY N/A 03/10/2018   Procedure: TOTAL THYROIDECTOMY;  Surgeon: Aviva Signs, MD;  Location: [facility];  Service: General;  Laterality: N/A;    Her Family History Is Significant For: Family History  Problem Relation Age of Onset  . COPD Mother   . Diabetes Mother   . Hypertension Mother   . High Cholesterol Mother   . Liver cancer Mother        bile duct cancer  . COPD Sister   . Hypertension Brother   . High Cholesterol Brother   . Kidney cancer Brother 44  .  Hypertension Brother   . Asthma Son   . Congenital heart disease Son        hypoplastic left heart syndrome  . Melanoma Maternal Aunt        dx in her 54s  . Brain cancer Maternal Grandmother        ? benign  . Heart disease Maternal Grandfather   . Colon cancer Other   . Lung disease Maternal Uncle   . Cancer Cousin        ? lunc cancer; mother's mat first cousin    Her Social History Is Significant For: Social History   Socioeconomic History  . Marital status: Widowed    Spouse name: Not on file  . Number of children: Not on file  . Years of education: Not on file  . Highest education level: Not on file  Occupational History  . Not on file  Social Needs  . Financial resource strain: Not on file  . Food insecurity    Worry: Not on file    Inability: Not on file  . Transportation needs    Medical: Not on file    Non-medical: Not on file  Tobacco Use  . Smoking status: Never Smoker  . Smokeless tobacco: Never Used  Substance and Sexual Activity  . Alcohol use: Yes    Comment: rarely   . Drug use: No  . Sexual activity: Not Currently  Lifestyle  . Physical activity    Days per week: Not on file    Minutes per session: Not on file  . Stress: Not on file  Relationships  . Social Herbalist on phone: Not on file    Gets together: Not on file    Attends religious service: Not on file    Active member of club or organization: Not on file    Attends meetings of clubs or organizations: Not on file    Relationship status: Not on file  Other Topics Concern  . Not on file  Social History Narrative  . Not on file    Her Allergies Are:  Allergies  Allergen Reactions  . Mango Flavor Swelling and Rash    Tongue swelling,  feels like throat is closing in on her   . Iodinated Diagnostic Agents Hives and Itching  . Isovue [Iopamidol] Hives and Itching    And hives   . Nitrofurantoin Hives  . Other Hives, Itching, Rash and Other (See Comments)    Use Paper  tape ONLY  . Sulfa Antibiotics Hives  . Adhesive [Tape] Rash and Other (See Comments)    Use Paper tape ONLY  :   Her Current Medications Are:  Outpatient Encounter Medications as of 05/20/2019  Medication Sig  . aspirin EC 81 MG tablet Take 81 mg by mouth daily.  . clonazePAM (KLONOPIN) 0.5 MG tablet Take 0.5 mg by mouth 2 (two) times daily as needed for anxiety.   . dicyclomine (BENTYL) 10 MG capsule TAKE 1 CAPSULE BY MOUTH IN THE MORNING, 2 CAPSULES AT NOON AND 1 CAPSULE AT BEDTIME.  Marland Kitchen GLUCOSAMINE-CHONDROITIN PO Take 1 capsule by mouth daily.   . hydrochlorothiazide (HYDRODIURIL) 25 MG tablet Take 0.5 tablets (12.5 mg total) by mouth daily.  Marland Kitchen HYDROcodone-acetaminophen (NORCO/VICODIN) 5-325 MG tablet Take 1 tablet by mouth every 6 (six) hours as needed for moderate pain.  Marland Kitchen levothyroxine (SYNTHROID) 125 MCG tablet Take 1 tablet (125 mcg total) by mouth daily before breakfast.  . losartan (COZAAR) 100 MG tablet TAKE 1 TABLET ONCE DAILY.  . methocarbamol (ROBAXIN) 500 MG tablet Take 1 tablet (500 mg total) by mouth every 8 (eight) hours as needed for muscle spasms.  . metoprolol tartrate (LOPRESSOR) 25 MG tablet TAKE 1/2 TABLET BY MOUTH AT BEDTIME.  . Multiple Vitamin (MULTI-DAY PO) Take by mouth daily.  . pantoprazole (PROTONIX) 40 MG tablet TAKE 1 TABLET DAILY BEFORE BREAKFAST.  Marland Kitchen potassium chloride SA (K-DUR,KLOR-CON) 20 MEQ tablet TAKE 1 TABLET TWICE DAILY FOR 3 DAYS THEN 1 TABLET DAILY.  Marland Kitchen predniSONE (DELTASONE) 50 MG tablet Take 79m 13 hours, 7 hours and 1 hour before your scan.  . rosuvastatin (CRESTOR) 5 MG tablet TAKE 1 TABLET BY MOUTH ONCE DAILY.  . diphenhydrAMINE (BENADRYL) 50 MG tablet Take 1 tablet (50 mg total) by mouth once for 1 dose. Take 570m1 hour before your scan   No facility-administered encounter medications on file as of 05/20/2019.   :  Review of Systems:  Out of a complete 14 point review of systems, all are reviewed and negative with the exception of these  symptoms as listed below: Review of Systems  Neurological:       Pt presents today to discuss her auto pap. Pt can feel a difference in her daytime fatigue.    Objective:  Neurological Exam  Physical Exam Physical Examination:   Vitals:   05/20/19 1308  BP: 129/79  Pulse: (!) 54    General Examination: The patient is a very pleasant [age].o. female in no acute distress. She appears well-developed and well-nourished and well groomed.   HEENT: Normocephalic, atraumatic, pupils are equal, round and reactive to light,Extraocular tracking is preserved, hearing grossly intact.  Face is symmetric with normal facial animation.  Airway examination reveals mild mouth dryness and a mildly crowded airway secondary to redundant soft palate, tonsils not visualized, tongue on the thicker and larger side.  Tongue protrudes centrally and palate elevates symmetrically.  No carotid bruits, unremarkable thyroidectomy scar.   Chest: Clear to auscultation without wheezing, rhonchi or crackles noted.  Heart: S1+S2+0, regular and normal without murmurs, rubs or gallops noted.   Abdomen: Soft, non-tender and non-distended with normal bowel sounds appreciated on auscultation.  Extremities: There is no pitting edema in the distal lower extremities bilaterally.   Skin: Warm and dry without trophic changes noted.   Musculoskeletal: exam reveals no obvious joint deformities, tenderness or joint swelling or erythema.   Neurologically:  Mental status: The patient is awake, alert and oriented in all 4 spheres. Her immediate and remote memory, attention, language skills and fund of knowledge are appropriate. There is no evidence of aphasia, agnosia, apraxia or anomia. Speech is clear with normal prosody and enunciation. Thought process is linear. Mood is normal and affect is normal.  Cranial nerves II - XII are as described above under HEENT exam. In addition: shoulder shrug is normal with equal shoulder height  noted. Motor exam: Normal bulk, strength and tone is noted. There is no tremor. Fine motor skills and coordination: grossly intact.  Cerebellar testing: No dysmetria or intention tremor. There is no truncal or gait ataxia.  Sensory exam: intact to light touch.  Gait, station and balance: She stands easily. No veering to one side is noted. No leaning to one side is noted. Posture is age-appropriate and stance is narrow based. Gait shows normal stride length and normal pace. No problems turning are noted.                Assessment and Plan:  Rebecca Scott is a 63 year old right-handed woman with an underlying medical history of prediabetes, papillary thyroid cancer, migraine headaches, hypertension, hyperlipidemia, history of kidney stones, reflux disease, diverticulitis, asthma, arthritis, anemia, anxiety, and obesity, Who presents for follow-up consultation of her obstructive sleep apnea.  She has been on a CPAP machine for many years, she has an older machine and needed reevaluation home sleep test from 02/11/2019 showed an AHI of 15.5/h, O2 nadir of 91%.  She has established treatment with new equipment, AutoPap and is fully compliant with treatment, the lapses on the AutoPap compliance are secondary to her travel and she was using her old CPAP machine during those times.  She has had some changes in her thyroid medication, increase in TSH at the latest checkup and has noticed more fatigue and increase in her total sleep time.  She is working on adjusting her thyroid medicine and is in close follow-up with her current endocrinologist.  She has moved to [facility], she can follow-up with me in 1 year or establish care with a sleep doctor locally hopefully at some point.  Nevertheless, if need be, we can consider a virtual visit for follow-up if it is covered by her insurance at the time.  She is commended on her treatment adherence and encouraged to be fully compliant with her AutoPap and she can continue to  use the CPAP for backup if needed.  I answered all her questions today and she was in agreement.  I spent 20 minutes in total face-to-face time with the patient, more than 50% of which was spent in counseling and coordination of care, reviewing test results, reviewing medication and discussing or reviewing the diagnosis of OSA, its prognosis and treatment options. Pertinent laboratory and imaging test results that were available during this visit with the patient were reviewed by me and considered in my medical decision making (see chart for details).

## 2019-05-20 NOTE — Patient Instructions (Signed)
I am glad you were able to moved to [facility] to be closer to your son and his family. Hopefully, you can establish care with a sleep specialist locally.  You are stable on treatment and not a novice on CPAP therapy, you can follow-up in 1 year, we would be happy to see you here.If need be, we could consider doing a virtual visit at the time if covered by your insurance.  You can certainly continue to use your CPAP for backup.   Please continue using your autoPAP regularly. While your insurance requires that you use PAP at least 4 hours each night on 70% of the nights, I recommend, that you not skip any nights and use it throughout the night if you can. Getting used to PAP and staying with the treatment long term does take time and patience and discipline. Untreated obstructive sleep apnea when it is moderate to severe can have an adverse impact on cardiovascular health and raise her risk for heart disease, arrhythmias, hypertension, congestive heart failure, stroke and diabetes. Untreated obstructive sleep apnea causes sleep disruption, nonrestorative sleep, and sleep deprivation. This can have an impact on your day to day functioning and cause daytime sleepiness and impairment of cognitive function, memory loss, mood disturbance, and problems focussing. Using PAP regularly can improve these symptoms.

## 2019-06-01 ENCOUNTER — Other Ambulatory Visit: Payer: Self-pay | Admitting: "Endocrinology

## 2019-06-01 MED ORDER — LEVOTHYROXINE SODIUM 125 MCG PO TABS: 125.0000 ug | ORAL_TABLET | Freq: Every day | ORAL | 0 refills | Status: DC

## 2019-06-01 NOTE — Telephone Encounter (Signed)
Forwarding to Dr. Nida 

## 2019-06-12 ENCOUNTER — Encounter (INDEPENDENT_AMBULATORY_CARE_PROVIDER_SITE_OTHER): Payer: Self-pay

## 2019-06-12 ENCOUNTER — Other Ambulatory Visit (INDEPENDENT_AMBULATORY_CARE_PROVIDER_SITE_OTHER): Payer: Self-pay | Admitting: *Deleted

## 2019-06-12 MED ORDER — DICYCLOMINE HCL 10 MG PO CAPS: ORAL_CAPSULE | ORAL | 2 refills | Status: DC

## 2019-07-03 IMAGING — MR MR THORACIC SPINE W/O CM
4 of 6 series · 10 of 48 positions shown · non-contrast
Comparison: None.

CLINICAL DATA: Right upper quadrant pain. Abdominal pain. Upper
back pain.

EXAM:
MRI THORACIC SPINE WITHOUT CONTRAST
TECHNIQUE: Multiplanar, multisequence MR imaging of the thoracic spine was
performed. No intravenous contrast was administered.

[Series 5: T1 · sagittal · 4.0mm · 0.57mm/px · 3 of 13 slices shown (1 of 2)]
[im 1/13]
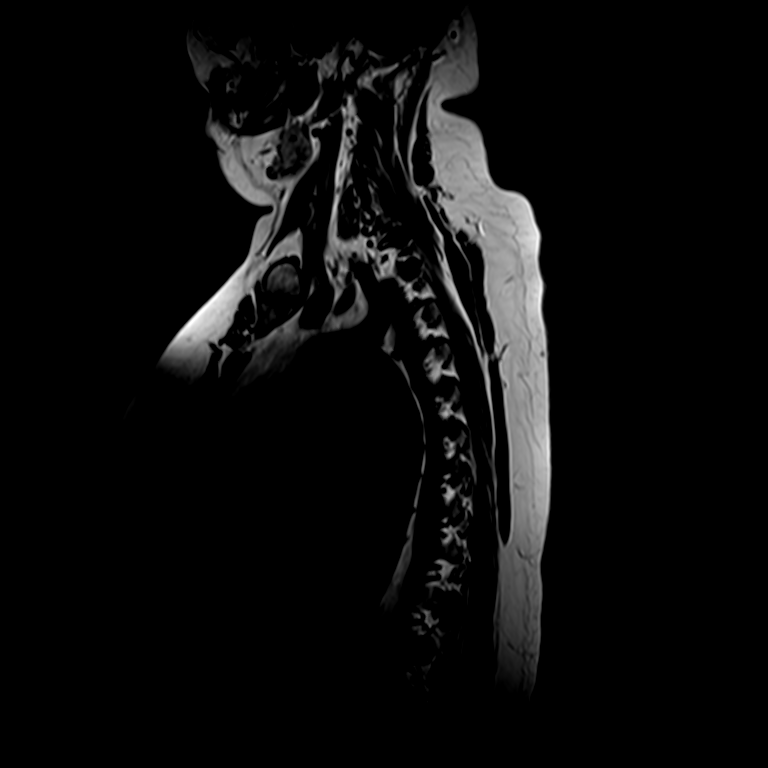
[im 9/13]
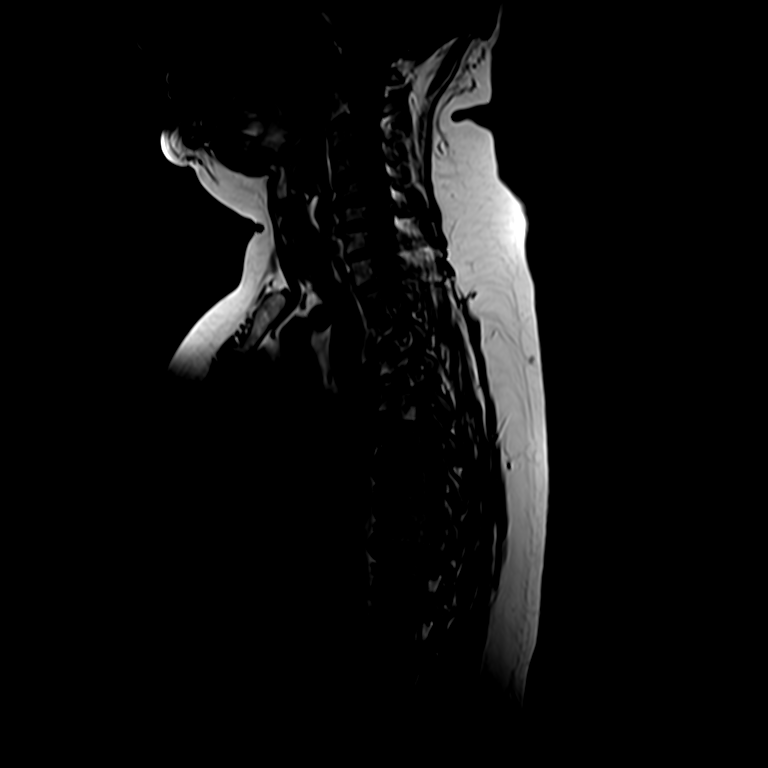
[im 13/13]
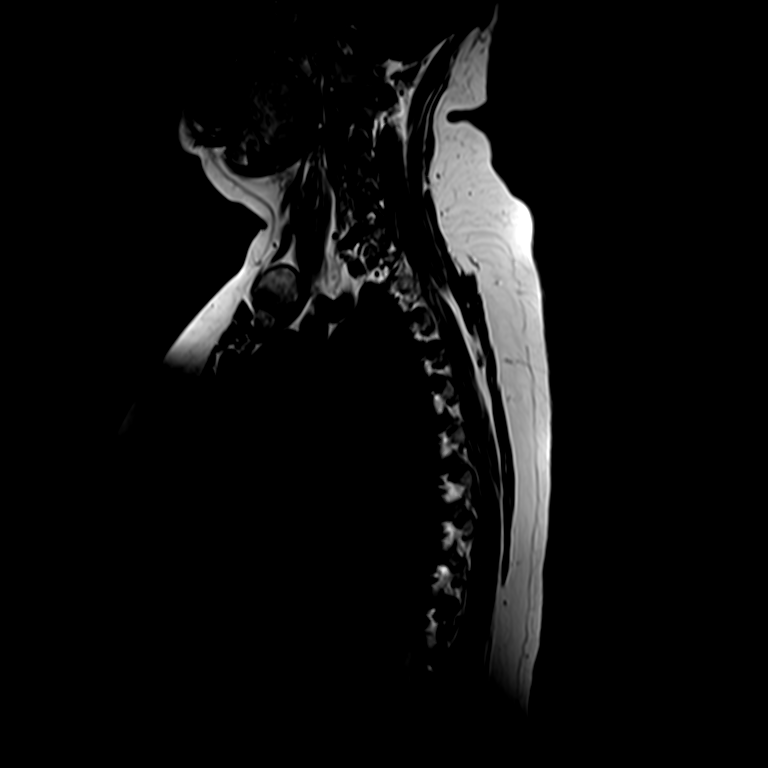

[Series 6: T2 · sagittal · 4.0mm · 0.38mm/px · 3 of 13 slices shown (1 of 2)]
[im 1/13]
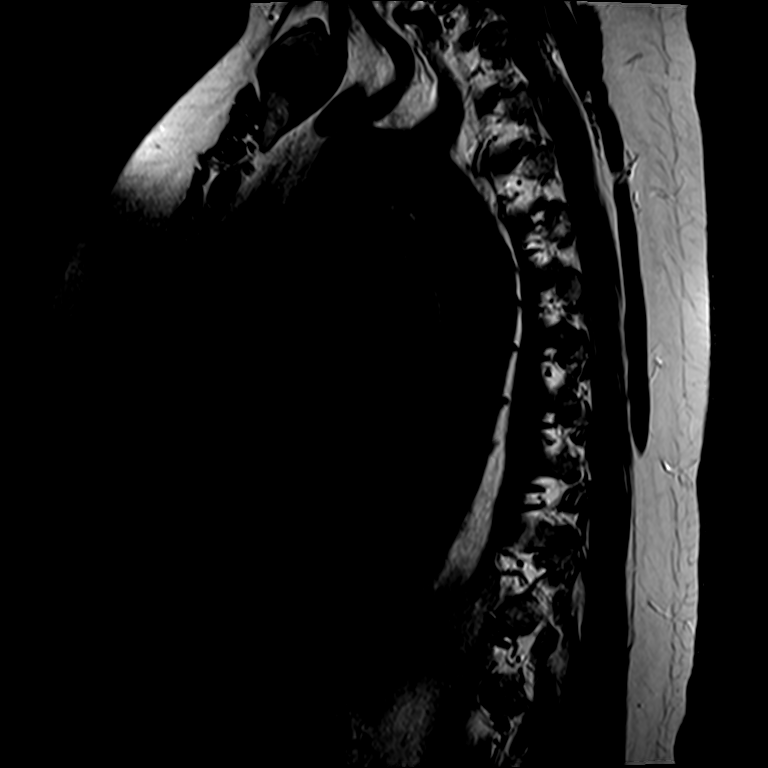
[im 7/13]
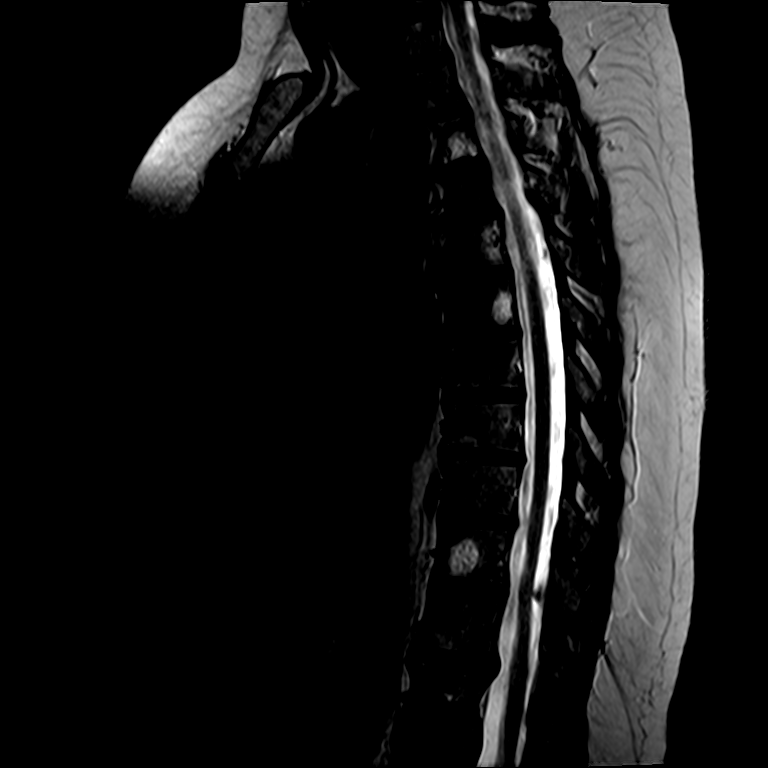
[im 13/13]
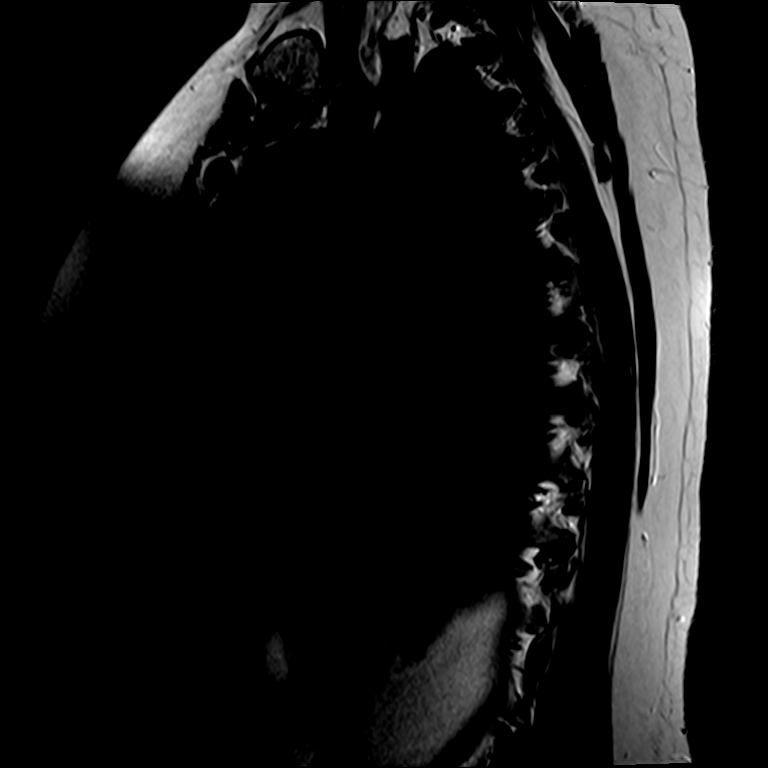

[Series 7: T1 · sagittal · 4.0mm · 0.38mm/px · 1 of 13 slices shown (2 of 2)]
[im 1/13]
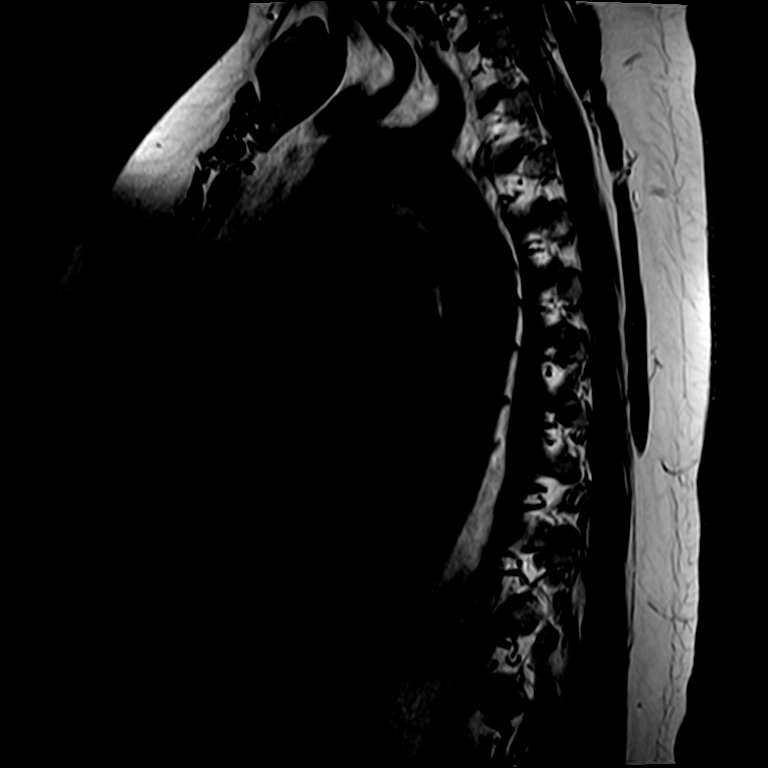

[Series 10: T2 · axial · 3.0mm · 0.21mm/px · z∈[-289,-137]mm · 3 of 47 slices shown (2 of 2)]
[im 6/47]
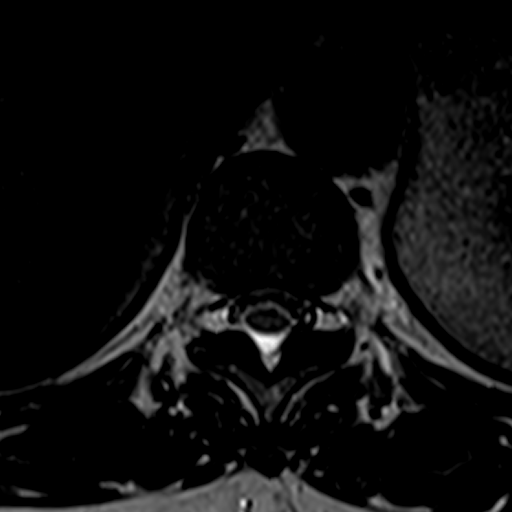
[im 24/47]
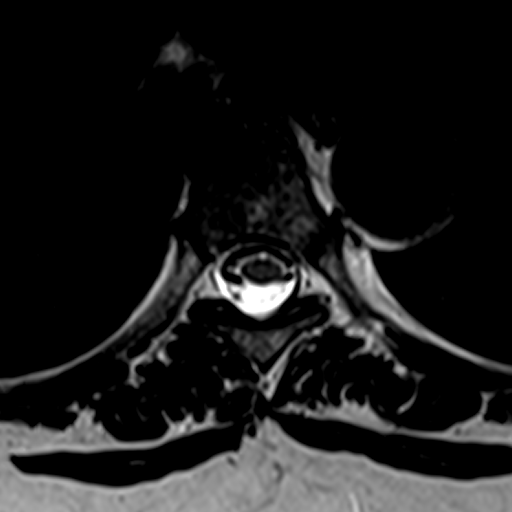
[im 41/47]
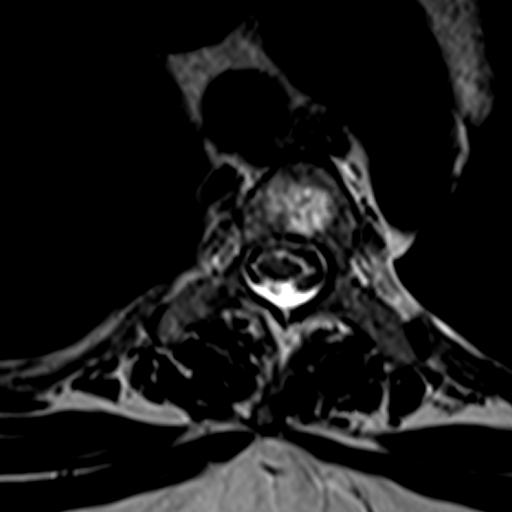

[10 of 48 positions shown; findings below may reference images not displayed]

FINDINGS: Alignment:  Physiologic.

Vertebrae: No fracture, evidence of discitis, or aggressive bone
lesion. T1 and T2 hyperintense bone lesion in the T3, T9, T10 and
T11 vertebral bodies most consistent with hemangiomas. T1
hypointense and T2 hyperintense 11 mm bone lesion in the posterior
aspect of the T6 vertebral body with stippled low signal foci on the
T2 weighted images suggesting an atypical hemangioma.

Cord:  Normal signal and morphology.

Paraspinal and other soft tissues: No focal paraspinal abnormality.
2 x 2.2 cm T2 hyperintense right thyroid mass of uncertain etiology.
13 mm T2 hyperintense left thyroid mass.

Disc levels:

Disc spaces:  Disc spaces are relatively well maintained.

T1-T2: No significant disc protrusion, foraminal stenosis or central
canal stenosis.

T2-T3: No significant disc protrusion, foraminal stenosis or central
canal stenosis.

T3-T4: No significant disc protrusion, foraminal stenosis or central
canal stenosis.

T4-T5: No significant disc protrusion, foraminal stenosis or central
canal stenosis.

T5-T6: No significant disc protrusion, foraminal stenosis or central
canal stenosis.

T6-T7: No significant disc protrusion, foraminal stenosis or central
canal stenosis.

T7-T8: No significant disc protrusion, foraminal stenosis or central
canal stenosis.

T8-T9: No significant disc protrusion, foraminal stenosis or central
canal stenosis.

T9-T10: No significant disc protrusion, foraminal stenosis or
central canal stenosis.

T10-T11: No significant disc protrusion, foraminal stenosis or
central canal stenosis.

T11-T12: Mild broad-based disc bulge. No foraminal or central canal
stenosis.
IMPRESSION: 1. No acute osseous abnormality of the thoracic spine.
2. No significant thoracic spine disc protrusion, foraminal stenosis
or central canal stenosis.
3. 2 x 2.2 cm T2 hyperintense right thyroid mass of uncertain
etiology. 13 mm T2 hyperintense left thyroid mass. Recommend further
evaluation with a dedicated thyroid ultrasound.

## 2019-07-06 ENCOUNTER — Ambulatory Visit (INDEPENDENT_AMBULATORY_CARE_PROVIDER_SITE_OTHER): Payer: 59 | Admitting: Otolaryngology

## 2019-07-06 ENCOUNTER — Other Ambulatory Visit: Payer: Self-pay

## 2019-07-06 DIAGNOSIS — R1312 Dysphagia, oropharyngeal phase: Secondary | ICD-10-CM | POA: Diagnosis not present

## 2019-07-06 DIAGNOSIS — K219 Gastro-esophageal reflux disease without esophagitis: Secondary | ICD-10-CM | POA: Diagnosis not present

## 2019-07-23 LAB — T4, FREE: Free T4: 1.2 ng/dL (ref 0.8–1.8)

## 2019-07-23 LAB — THYROGLOBULIN ANTIBODY: Thyroglobulin Ab: <1 IU/mL (ref <=1)

## 2019-07-23 LAB — TSH: TSH: 0.32 mIU/L — ABNORMAL LOW (ref 0.40–4.50)

## 2019-07-23 LAB — THYROGLOBULIN LEVEL: Thyroglobulin: <0.1 ng/mL — ABNORMAL LOW

## 2019-07-28 ENCOUNTER — Other Ambulatory Visit: Payer: Self-pay

## 2019-07-28 ENCOUNTER — Encounter (INDEPENDENT_AMBULATORY_CARE_PROVIDER_SITE_OTHER): Payer: Self-pay | Admitting: Internal Medicine

## 2019-07-28 ENCOUNTER — Ambulatory Visit (INDEPENDENT_AMBULATORY_CARE_PROVIDER_SITE_OTHER): Payer: 59 | Admitting: Internal Medicine

## 2019-07-28 VITALS — BP 139/81 | HR 54 | Temp 97.6°F | Ht 68.0 in | Wt 208.3 lb

## 2019-07-28 DIAGNOSIS — K589 Irritable bowel syndrome without diarrhea: Secondary | ICD-10-CM | POA: Insufficient documentation

## 2019-07-28 DIAGNOSIS — K219 Gastro-esophageal reflux disease without esophagitis: Secondary | ICD-10-CM

## 2019-07-28 DIAGNOSIS — R1011 Right upper quadrant pain: Secondary | ICD-10-CM | POA: Diagnosis not present

## 2019-07-28 DIAGNOSIS — K582 Mixed irritable bowel syndrome: Secondary | ICD-10-CM | POA: Diagnosis not present

## 2019-07-28 MED ORDER — ESOMEPRAZOLE MAGNESIUM 40 MG PO CPDR: 40.0000 mg | DELAYED_RELEASE_CAPSULE | Freq: Two times a day (BID) | ORAL | 3 refills | Status: DC

## 2019-07-28 NOTE — Patient Instructions (Signed)
Remember to eat your supper at least 3 hours before you go to bed. Please call office with progress report in 4 to 6 weeks. If Nexium/omeprazole at high dose works will continue for 3 to 4 months and then drop it to once a day. Continue Pepcid/famotidine at bedtime as before.

## 2019-07-28 NOTE — Progress Notes (Signed)
Presenting complaint;  Follow-up for GERD and diarrhea/IBS. Patient continues to complain of right upper quadrant abdominal pain.  Database and subjective:  Patient is 63 year old Caucasian female who is here for scheduled visit.  She was initially evaluated by me in October 2018 for right upper quadrant abdominal pain which started after she had surgery for renal carcinoma.  She underwent extensive work-up and was felt the pain was musculoskeletal or neuropathic.  During work-up she was found to have thyroid enlargement and eventually diagnosed with papillary carcinoma and underwent thyroidectomy in July 2019 and has remained in remission.  She was last seen in July 2020 for sigmoid diverticulitis and treated with antibiotics.  She also has a history of diarrhea felt to be due to IBS.  She is up-to-date on CRC screening schedule.  Last colonoscopy was in February 2019. She continues to experience right upper quadrant abdominal pain.  She said it has not changed.  If anything is not as pronounced and frequent.  However she has noted right upper quadrant pain if she eats salad or barbecue.  She continues to complain of diarrhea and urgency.  She is taking dicyclomine 4 times a day.  She takes fourth dose at bedtime.  She feels dry mouth is not bad enough and she is able to cope with it.  She is still having periods of diarrhea and constipation.  On diarrhea days she has 3-4 stools and then she can go 3 to 4 days without a bowel movement.  She does not remember the last time she had a normal stool.  She denies melena or rectal bleeding. She says heartburn is well controlled with PPI but she is having constant feeling of choking.  She also has to clear her throat frequently.  She was seen by Dr. Lorelee Cover who noted severe inflammation.  He added famotidine at bedtime.  Patient was seen in follow-up on 07/06/2019.  He noted no improvement and he felt she may eventually need surgery. She states she had an episode  of lower abdominal pain last month and wonders if she had mild episode of diverticulitis.  She did not go on any antibiotics.  She did not have fever either. Her appetite is fair.  Her weight has been stable. She says she tries to be active.  She walks for 30 to 45 minutes every day. She is worried about all of the symptoms since she was diagnosed with renal and then thyroid carcinoma.   Current Medications: Outpatient Encounter Medications as of 07/28/2019  Medication Sig  . aspirin EC 81 MG tablet Take 81 mg by mouth daily.  . clonazePAM (KLONOPIN) 0.5 MG tablet Take 0.5 mg by mouth 2 (two) times daily as needed for anxiety.   . diclofenac Sodium (VOLTAREN) 1 % GEL Apply 2 g topically. As needed.  . dicyclomine (BENTYL) 10 MG capsule TAKE 1 CAPSULE BY MOUTH IN THE MORNING, 2 CAPSULES AT NOON AND 1 CAPSULE AT BEDTIME.  . diphenhydrAMINE (BENADRYL) 50 MG tablet Take 1 tablet (50 mg total) by mouth once for 1 dose. Take 50mg  1 hour before your scan  . famotidine (PEPCID) 20 MG tablet Take 20 mg by mouth at bedtime.   Marland Kitchen GLUCOSAMINE-CHONDROITIN PO Take 1 capsule by mouth daily.   . hydrochlorothiazide (HYDRODIURIL) 25 MG tablet Take 0.5 tablets (12.5 mg total) by mouth daily.  Marland Kitchen HYDROcodone-acetaminophen (NORCO/VICODIN) 5-325 MG tablet Take 1 tablet by mouth every 6 (six) hours as needed for moderate pain.  Marland Kitchen levothyroxine (SYNTHROID)  125 MCG tablet Take 1 tablet (125 mcg total) by mouth daily before breakfast.  . losartan (COZAAR) 100 MG tablet TAKE 1 TABLET ONCE DAILY.  . methocarbamol (ROBAXIN) 500 MG tablet Take 1 tablet (500 mg total) by mouth every 8 (eight) hours as needed for muscle spasms.  . metoprolol tartrate (LOPRESSOR) 25 MG tablet TAKE 1/2 TABLET BY MOUTH AT BEDTIME.  . Multiple Vitamin (MULTI-DAY PO) Take by mouth daily.  . pantoprazole (PROTONIX) 40 MG tablet TAKE 1 TABLET DAILY BEFORE BREAKFAST.  Marland Kitchen potassium chloride SA (K-DUR,KLOR-CON) 20 MEQ tablet TAKE 1 TABLET TWICE DAILY  FOR 3 DAYS THEN 1 TABLET DAILY.  Marland Kitchen predniSONE (DELTASONE) 50 MG tablet Take 50mg  13 hours, 7 hours and 1 hour before your scan.  . rosuvastatin (CRESTOR) 5 MG tablet TAKE 1 TABLET BY MOUTH ONCE DAILY.   No facility-administered encounter medications on file as of 07/28/2019.      Objective: Blood pressure 139/81, pulse (!) 54, temperature 97.6 F (36.4 C), temperature source Oral, height 5\' 8"  (1.727 m), weight 208 lb 4.8 oz (94.5 kg). Patient is alert and in no acute distress. She is wearing a mask. She is clearing her throat repeatedly. Conjunctiva is pink. Sclera is nonicteric Oropharyngeal mucosa is normal. No neck masses or thyromegaly noted. Cardiac exam with regular rhythm normal S1 and S2. No murmur or gallop noted. Lungs are clear to auscultation. Abdomen is full.  Bowel sounds are normal.  She has mild tenderness in epigastrium and below the right costal margin.  No organomegaly or masses. No LE edema or clubbing noted.   Assessment:  #1.  Chronic GERD.  She had EGD and October 2018 and no abnormality was noted to esophageal mucosa.  Typical symptoms are well controlled with therapy but she remains with feeling of lump in her throat and choking sensation as well as need to clear her throat frequently.  She has not responded to current therapy.  Therefore it may be time to change her PPI.  She appears to be watching her diet but she needs to make sure that evening meal is at least 3 hours before she goes to bed.  #2.  Chronic right upper quadrant abdominal pain felt to be musculoskeletal or neuropathic.  Patient reassured.  She has undergone extensive work-up in the past and no indication to repeat any studies at this time.  #3.  Irritable bowel syndrome.  Patient would benefit from using glycerin or Dulcolax suppository if she goes without a bowel movement for more than 1 day.  If she can prevent constipation may be she will have less diarrhea.  I feel stress and anxiety is  playing a significant role in causing bowel dysfunction   Plan:  Discontinue pantoprazole. Continue famotidine 20 mg by mouth daily at bedtime. Begin esomeprazole 40 mg by mouth 30 minutes before breakfast and evening meal daily. Patient will call office with progress report in 4 to 6 weeks.  If this change improve her throat symptoms will continue current regimen for 12 weeks and then drop evening dose of esomeprazole.  If she does not feel any better will decrease PPI dose earlier. Use Dulcolax suppository on as-needed basis. Office visit in 6 months.

## 2019-07-30 ENCOUNTER — Ambulatory Visit (INDEPENDENT_AMBULATORY_CARE_PROVIDER_SITE_OTHER): Payer: 59 | Admitting: "Endocrinology

## 2019-07-30 ENCOUNTER — Other Ambulatory Visit: Payer: Self-pay

## 2019-07-30 ENCOUNTER — Encounter: Payer: Self-pay | Admitting: "Endocrinology

## 2019-07-30 DIAGNOSIS — C73 Malignant neoplasm of thyroid gland: Secondary | ICD-10-CM

## 2019-07-30 DIAGNOSIS — E890 Postprocedural hypothyroidism: Secondary | ICD-10-CM | POA: Diagnosis not present

## 2019-07-30 MED ORDER — LEVOTHYROXINE SODIUM 125 MCG PO TABS: 125.0000 ug | ORAL_TABLET | Freq: Every day | ORAL | 6 refills | Status: DC

## 2019-07-30 NOTE — Progress Notes (Signed)
Endocrinology follow-up note                                            07/30/2019, 9:58 AM   Subjective:    Patient ID: Rebecca Scott, female    DOB: 03/31/1956, PCP Janora Norlander, DO   Past Medical History:  Diagnosis Date  . Anemia   . Anxiety   . Arthritis   . Asthma    seasonal asthma rarely   . Chronic kidney disease    renal mass right kidney   . CPAP (continuous positive airway pressure) dependence   . Diverticulitis   . Family history of cancer of extrahepatic bile ducts   . Family history of colon cancer   . Family history of kidney cancer   . Family history of melanoma   . GERD (gastroesophageal reflux disease)   . Hemorrhoids   . History of kidney stones   . HTN (hypertension) 07/29/2014  . Hyperlipemia   . Hypertension   . Migraine    no migraines now, will have stress headaches  . Multinodular goiter 08/21/2017   Last Assessment & Plan:  She will need an f/u u/s of thyroid to reassess nodules. Repeat in 07/2018 Nodules are too small for biopsy now.  . Papillary renal cell carcinoma ([facility]) 05/2017   s/p partial nephrectomy, right  . Papillary thyroid carcinoma ([facility]) 03/10/2018  . PONV (postoperative nausea and vomiting)    patient reports being "slower to wake than average "   . Pre-diabetes    "ive been told i'm borderline"   . Sleep apnea    CPAP use    Past Surgical History:  Procedure Laterality Date  . ABDOMINAL HYSTERECTOMY    . APPENDECTOMY  1978  . BIOPSY  07/08/2017   Procedure: BIOPSY;  Surgeon: Rogene Houston, MD;  [facility]: [facility];  Service: Endoscopy;;  gastric  . CHOLECYSTECTOMY  2005  . COLONOSCOPY WITH PROPOFOL N/A 10/28/2017   Procedure: COLONOSCOPY WITH PROPOFOL;  Surgeon: Rogene Houston, MD;  [facility]: [facility];  Service: Endoscopy;  Laterality: N/A;  7:30  . ESOPHAGOGASTRODUODENOSCOPY N/A 07/08/2017   Procedure: ESOPHAGOGASTRODUODENOSCOPY (EGD);  Surgeon: Rogene Houston, MD;  [facility];  Service: Endoscopy;  Laterality: N/A;  220  . LUMBAR LAMINECTOMY/DECOMPRESSION MICRODISCECTOMY N/A 09/26/2018   Procedure: L4-5 MICRODISCECTOMY;  Surgeon: [name], MD;  Location: [facility];  Service: Orthopedics;  Laterality: N/A;  . NASAL SEPTUM SURGERY    . ROBOTIC ASSITED PARTIAL NEPHRECTOMY Right 05/17/2017   Procedure: XI ROBOTIC ASSITED PARTIAL NEPHRECTOMY;  Surgeon: Alexis Frock, MD;  Location: [facility];  Service: Urology;  Laterality: Right;  . Ruptured Disk L5    . THYROIDECTOMY N/A 03/10/2018   Procedure: TOTAL THYROIDECTOMY;  Surgeon: Aviva Signs, MD;  Location: [facility];  Service: General;  Laterality: N/A;   Social History   Socioeconomic History  . Marital status: Widowed    Spouse name: Not on file  . Number of children: Not on file  . Years of education: Not on file  . Highest education level: Not on file  Occupational History  . Not on file  Social Needs  . Financial resource strain: Not on file  . Food insecurity    Worry: Not on file    Inability: Not on file  .  Transportation needs    Medical: Not on file    Non-medical: Not on file  Tobacco Use  . Smoking status: Never Smoker  . Smokeless tobacco: Never Used  Substance and Sexual Activity  . Alcohol use: Yes    Comment: rarely   . Drug use: No  . Sexual activity: Not Currently  Lifestyle  . Physical activity    Days per week: Not on file    Minutes per session: Not on file  . Stress: Not on file  Relationships  . Social Herbalist on phone: Not on file    Gets together: Not on file    Attends religious service: Not on file    Active member of club or organization: Not on file    Attends meetings of clubs or organizations: Not on file    Relationship status: Not on file  Other Topics Concern  . Not on file  Social History Narrative  . Not on file   Outpatient Encounter Medications as of 07/30/2019  Medication Sig  . aspirin EC 81 MG tablet Take 81 mg by mouth daily.  .  clonazePAM (KLONOPIN) 0.5 MG tablet Take 0.5 mg by mouth 2 (two) times daily as needed for anxiety.   . diclofenac Sodium (VOLTAREN) 1 % GEL Apply 2 g topically. As needed.  . dicyclomine (BENTYL) 10 MG capsule TAKE 1 CAPSULE BY MOUTH IN THE MORNING, 2 CAPSULES AT NOON AND 1 CAPSULE AT BEDTIME.  . diphenhydrAMINE (BENADRYL) 50 MG tablet Take 1 tablet (50 mg total) by mouth once for 1 dose. Take 50mg  1 hour before your scan  . esomeprazole (NEXIUM) 40 MG capsule Take 1 capsule (40 mg total) by mouth 2 (two) times daily before a meal.  . famotidine (PEPCID) 20 MG tablet Take 20 mg by mouth at bedtime.   Marland Kitchen GLUCOSAMINE-CHONDROITIN PO Take 1 capsule by mouth daily.   . hydrochlorothiazide (HYDRODIURIL) 25 MG tablet Take 0.5 tablets (12.5 mg total) by mouth daily.  Marland Kitchen HYDROcodone-acetaminophen (NORCO/VICODIN) 5-325 MG tablet Take 1 tablet by mouth every 6 (six) hours as needed for moderate pain.  Marland Kitchen levothyroxine (SYNTHROID) 125 MCG tablet Take 1 tablet (125 mcg total) by mouth daily before breakfast.  . losartan (COZAAR) 100 MG tablet TAKE 1 TABLET ONCE DAILY.  . methocarbamol (ROBAXIN) 500 MG tablet Take 1 tablet (500 mg total) by mouth every 8 (eight) hours as needed for muscle spasms.  . metoprolol tartrate (LOPRESSOR) 25 MG tablet TAKE 1/2 TABLET BY MOUTH AT BEDTIME.  . Multiple Vitamin (MULTI-DAY PO) Take by mouth daily.  . potassium chloride SA (K-DUR,KLOR-CON) 20 MEQ tablet TAKE 1 TABLET TWICE DAILY FOR 3 DAYS THEN 1 TABLET DAILY.  Marland Kitchen predniSONE (DELTASONE) 50 MG tablet Take 50mg  13 hours, 7 hours and 1 hour before your scan.  . rosuvastatin (CRESTOR) 5 MG tablet TAKE 1 TABLET BY MOUTH ONCE DAILY.  . [DISCONTINUED] levothyroxine (SYNTHROID) 125 MCG tablet Take 1 tablet (125 mcg total) by mouth daily before breakfast.   No facility-administered encounter medications on file as of 07/30/2019.    ALLERGIES: Allergies  Allergen Reactions  . Mango Flavor Swelling and Rash    Tongue swelling, feels  like throat is closing in on her   . Iodinated Diagnostic Agents Hives and Itching  . Isovue [Iopamidol] Hives and Itching    And hives   . Nitrofurantoin Hives  . Other Hives, Itching, Rash and Other (See Comments)    Use Paper tape ONLY  .  Sulfa Antibiotics Hives  . Adhesive [Tape] Rash and Other (See Comments)    Use Paper tape ONLY    VACCINATION STATUS: Immunization History  Administered Date(s) Administered  . Influenza,inj,Quad PF,6+ Mos 06/06/2018  . Influenza-Unspecified 06/24/2017, 06/11/2019  . Tdap 09/16/2018    HPI JEFFRIE ARONICA is 63 y.o. female who is returning for follow-up after recent near total thyroidectomy for thyroid malignancy, and postsurgical hypothyroidism.    -She underwent near total thyroidectomy on March 10, 2018 which revealed bilateral follicular variant papillary thyroid carcinoma Pathologic Stage Classification (pTNM, AJCC 8th Edition): pT2(multifocal), pNX.  She is currently on Synthroid 125 mcg p.o. daily before breakfast.  she reports compliance. -She has recovered from her surgery very well.  She denies dysphagia, odynophagia, voice change, nor shortness of breath.  -She was found to have multinodular goiter in November 2018.  At that time she was found to have 2.4 cm nodule in the 5 cm right lobe of thyroid and 1.5 cm nodule in the 4.3 cm left lobe.  Repeat ultrasound in April 2019 showed thyroid remaining more or less the same size however the nodules were observed to grow to 2.7 cm on the right lobe and 1.7 cm in the left lobe.  Subsequently, earlier this month she underwent fine-needle aspiration of the right lobe nodule which showed follicular lesion of undetermined significance.  Molecular studies were not sent.   -Prior to her surgery, patient complained of  foreign body sensation and occasional choking in her neck while eating and breathing problem when she was laying on her back.   -She denies any exposure to neck radiation.  She denies  family history of thyroid cancer, however multiple family members with what appears to be hypothyroidism.  She denies heat/cold intolerance.   -Her previsit thyroid/neck ultrasound is consistent with surgical changes with no evidence of residual or recurrent thyroid malignancy. -She is currently on levothyroxine 125 mcg p.o. daily.  Reports good compliance.    Her previsit thyroid function tests are consistent with appropriate replacement with thyroid hormone.    Review of Systems Limited as above.  Objective:    There were no vitals taken for this visit.  Wt Readings from Last 3 Encounters:  07/28/19 208 lb 4.8 oz (94.5 kg)  05/20/19 204 lb (92.5 kg)  05/04/19 203 lb 11.2 oz (92.4 kg)     Diabetic Labs (most recent): Lab Results  Component Value Date   HGBA1C 5.1 09/08/2018   HGBA1C 5.4 03/06/2018   HGBA1C 5.7 (H) 05/09/2017     Recent Results (from the past 2160 hour(s))  TSH     Status: Abnormal   Collection Time: 07/22/19  3:02 PM  Result Value Ref Range   TSH 0.32 (L) 0.40 - 4.50 mIU/L  T4, free     Status: None   Collection Time: 07/22/19  3:02 PM  Result Value Ref Range   Free T4 1.2 0.8 - 1.8 ng/dL  Thyroglobulin antibody     Status: None   Collection Time: 07/22/19  3:02 PM  Result Value Ref Range   Thyroglobulin Ab <1 < or = 1 IU/mL  Thyroglobulin Level     Status: Abnormal   Collection Time: 07/22/19  3:02 PM  Result Value Ref Range   Thyroglobulin <0.1 (L) ng/mL    Comment:       Reference Range:       Intact Thyroid   2.8-40.9       Athyrotic        <  0.1 .       Note: Abnormal flagging is based       on the reference interval for        patients with intact thyroid. . . This test was performed using the Beckman Coulter  chemiluminescent method. Values obtained from different assay methods cannot be used interchangeably. Thyroglobulin levels, regardless of value, should not be interpreted as absolute evidence of the presence or absence of  disease. .    Comment      Comment: . Thyroglobulin antibodies (TGAB) interfere with thyroglobulin (TG) assays; therefore, TGAB assay should always be performed in conjunction with a TG assay. . . For additional information, please refer to  http://education.questdiagnostics.com/faq/FAQ202  (This link is being provided for informational/ educational purposes only.) .        Diagnosis 1. Thyroid, lobectomy, left lobe and isthmus - PAPILLARY THYROID CARCINOMA, FOLLICULAR VARIANT, 1.3 CM. - TUMOR CONFINED WITHIN THYROID CAPSULE. - MARGINS NOT INVOLVED. 2. Thyroid, lobectomy, right lobe - PAPILLARY THYROID CARCINOMA, FOLLICULAR VARIANT, 2.6 CM. - TUMOR FOCALLY LESS THAN 0.1 CM FROM MARGIN.   Pathologic Stage Classification (pTNM, AJCC 8th Edition): pT2(multifocal), pNX.   Thyrogen stimulated whole-body scan on June 23, 2018 FINDINGS: Radio iodine accumulation is seen at the thyroid bed bilaterally consistent with thyroid remnant. No distant sites of abnormal radio iodine accumulation are identified to suggest iodine-avid metastatic thyroid cancer.  IMPRESSION: Thyroid remnant.  No scintigraphic evidence of iodine-avid thyroid cancer metastases.  Thyroid/neck ultrasound for Jan 13, 2019- consistent with surgical changes with absence of residual or recurrent thyroid disease.  Assessment & Plan:   1.  Follicular variant of papillary thyroid cancer  -She is status post negative her thyroidectomy on March 10, 2018 with Dr. Aviva Signs resulting in  Pathologic Stage Classification (pTNM, AJCC 8th Edition): pT2(multifocal), pNX.  -She he is status post radioactive iodine remnant ablation followed by whole body scan which was completed on June 24, [facility] at [facility].  -The whole body scan revealed some thyroid remnant in the thyroid bed, no evidence of distant metastasis.  Her previsit thyroid/neck ultrasound is consistent with surgical changes of absent  residual or recurrent thyroid disease. -She willd not need any further intervention at this time, -I had a long discussion with her regarding the need for continued monitoring with yearly imaging studies for next 5-10 years.  Her thyroglobulin levels are low at <0.1 with negative thyroglobulin antibodies.  She will be considered for Thyrogen stimulated whole-body scan prior to her late next visit in 6 months. - 2.  Postsurgical hypothyroidism  -Her previsit thyroid function tests are consistent with appropriate replacement and suppression of TSH.    -She is advised to continue Synthroid 125 mcg p.o. daily before breakfast.    - We discussed about the correct intake of her thyroid hormone, on empty stomach at fasting, with water, separated by at least 30 minutes from breakfast and other medications,  and separated by more than 4 hours from calcium, iron, multivitamins, acid reflux medications (PPIs). -Patient is made aware of the fact that thyroid hormone replacement is needed for life, dose to be adjusted by periodic monitoring of thyroid function tests.    - I advised her  to maintain close follow up with Janora Norlander, DO for primary care needs.  - Patient Care Time Today:  25 min, of which >50% was spent in  counseling and the rest reviewing her  current and  previous labs/studies, previous thyroid imaging  studies, surgery, treatments,  and medications' doses and developing a plan for long-term care based on the latest recommendations for standards of care.   Rebecca Scott participated in the discussions, expressed understanding, and voiced agreement with the above plans.  All questions were answered to her satisfaction. she is encouraged to contact clinic should she have any questions or concerns prior to her return visit.    Follow up plan: Return in about 6 months (around 01/27/2020) for Follow up with Pre-visit Labs, Follow up with Whole Body Scan w/Thyrogen.   Glade Lloyd, MD [facility] [address] [place], [place] [address] Phone: [phone]  Fax: [phone]     07/30/2019, 9:58 AM  This note was partially dictated with voice recognition software. Similar sounding words can be transcribed inadequately or may not  be corrected upon review.

## 2019-08-12 ENCOUNTER — Other Ambulatory Visit (HOSPITAL_COMMUNITY): Payer: 59

## 2019-08-13 ENCOUNTER — Other Ambulatory Visit (HOSPITAL_COMMUNITY): Payer: 59

## 2019-08-14 ENCOUNTER — Other Ambulatory Visit (HOSPITAL_COMMUNITY): Payer: 59

## 2019-08-17 ENCOUNTER — Other Ambulatory Visit (HOSPITAL_COMMUNITY): Payer: 59

## 2019-08-20 ENCOUNTER — Ambulatory Visit (INDEPENDENT_AMBULATORY_CARE_PROVIDER_SITE_OTHER): Payer: 59 | Admitting: Otolaryngology

## 2019-08-28 ENCOUNTER — Other Ambulatory Visit: Payer: Self-pay | Admitting: Family Medicine

## 2019-09-09 ENCOUNTER — Other Ambulatory Visit (INDEPENDENT_AMBULATORY_CARE_PROVIDER_SITE_OTHER): Payer: Self-pay | Admitting: Internal Medicine

## 2019-09-14 ENCOUNTER — Telehealth: Payer: Self-pay | Admitting: "Endocrinology

## 2019-09-14 NOTE — Telephone Encounter (Signed)
Rebecca Scott with pre-service center called and left a Vm , she has a nuc med study on 1/6 and they need prior auth

## 2019-09-14 NOTE — Telephone Encounter (Signed)
PA submitted to [facility]

## 2019-09-15 NOTE — Telephone Encounter (Signed)
Pts appt moved to May 2021. Pt notified. Will continue to monitor PA decision.

## 2019-09-15 NOTE — Telephone Encounter (Signed)
Additional clinical info sent to [facility] per request

## 2019-09-16 ENCOUNTER — Encounter (HOSPITAL_COMMUNITY): Payer: Self-pay

## 2019-09-16 ENCOUNTER — Encounter (HOSPITAL_COMMUNITY): Payer: 59

## 2019-09-17 ENCOUNTER — Encounter (HOSPITAL_COMMUNITY): Payer: Self-pay

## 2019-09-17 ENCOUNTER — Encounter (HOSPITAL_COMMUNITY): Payer: 59

## 2019-09-18 ENCOUNTER — Encounter (HOSPITAL_COMMUNITY): Payer: 59

## 2019-09-18 ENCOUNTER — Encounter (HOSPITAL_COMMUNITY): Payer: Self-pay

## 2019-09-20 ENCOUNTER — Other Ambulatory Visit: Payer: Self-pay | Admitting: Family Medicine

## 2019-09-21 ENCOUNTER — Encounter (HOSPITAL_COMMUNITY): Payer: Self-pay

## 2019-09-21 ENCOUNTER — Encounter (HOSPITAL_COMMUNITY): Payer: 59

## 2019-10-02 ENCOUNTER — Other Ambulatory Visit (INDEPENDENT_AMBULATORY_CARE_PROVIDER_SITE_OTHER): Payer: Self-pay | Admitting: Internal Medicine

## 2019-10-14 ENCOUNTER — Other Ambulatory Visit: Payer: Self-pay | Admitting: Family Medicine

## 2019-10-14 NOTE — Telephone Encounter (Signed)
Gottschalk. Needs to be seen. 30 day supply given 09/21/19.

## 2019-10-15 ENCOUNTER — Other Ambulatory Visit: Payer: Self-pay | Admitting: Family Medicine

## 2019-10-26 ENCOUNTER — Other Ambulatory Visit: Payer: Self-pay

## 2019-10-26 ENCOUNTER — Inpatient Hospital Stay (HOSPITAL_COMMUNITY): Payer: 59 | Attending: Hematology

## 2019-10-26 ENCOUNTER — Ambulatory Visit (HOSPITAL_COMMUNITY)
Admission: RE | Admit: 2019-10-26 | Discharge: 2019-10-26 | Disposition: A | Discharge status: Home / Self Care | Payer: 59 | Source: Ambulatory Visit | Attending: Hematology | Admitting: Hematology

## 2019-10-26 DIAGNOSIS — Z85528 Personal history of other malignant neoplasm of kidney: Secondary | ICD-10-CM | POA: Insufficient documentation

## 2019-10-26 DIAGNOSIS — Z85850 Personal history of malignant neoplasm of thyroid: Secondary | ICD-10-CM | POA: Insufficient documentation

## 2019-10-26 DIAGNOSIS — D649 Anemia, unspecified: Secondary | ICD-10-CM | POA: Diagnosis not present

## 2019-10-26 DIAGNOSIS — Z923 Personal history of irradiation: Secondary | ICD-10-CM | POA: Insufficient documentation

## 2019-10-26 DIAGNOSIS — Z905 Acquired absence of kidney: Secondary | ICD-10-CM | POA: Insufficient documentation

## 2019-10-26 LAB — COMPREHENSIVE METABOLIC PANEL
ALT: 27 U/L (ref 0–44)
AST: 24 U/L (ref 15–41)
Albumin: 4.3 g/dL (ref 3.5–5.0)
Alkaline Phosphatase: 108 U/L (ref 38–126)
Anion gap: 13 (ref 5–15)
BUN: 12 mg/dL (ref 8–23)
CO2: 25 mmol/L (ref 22–32)
Calcium: 10 mg/dL (ref 8.9–10.3)
Chloride: 103 mmol/L (ref 98–111)
Creatinine, Ser: 0.76 mg/dL (ref 0.44–1.00)
GFR calc Af Amer: >60 mL/min (ref >60)
GFR calc non Af Amer: >60 mL/min (ref >60)
Glucose, Bld: 166 mg/dL — ABNORMAL HIGH (ref 70–99)
Potassium: 3.7 mmol/L (ref 3.5–5.1)
Sodium: 141 mmol/L (ref 135–145)
Total Bilirubin: 0.5 mg/dL (ref 0.3–1.2)
Total Protein: 8 g/dL (ref 6.5–8.1)

## 2019-10-26 LAB — CBC WITH DIFFERENTIAL/PLATELET
Abs Immature Granulocytes: 0.04 10*3/uL (ref 0.00–0.07)
Basophils Absolute: 0 10*3/uL (ref 0.0–0.1)
Basophils Relative: 1 %
Eosinophils Absolute: 0 10*3/uL (ref 0.0–0.5)
Eosinophils Relative: 0 %
HCT: 38 % (ref 36.0–46.0)
Hemoglobin: 11.8 g/dL — ABNORMAL LOW (ref 12.0–15.0)
Immature Granulocytes: 1 %
Lymphocytes Relative: 16 %
Lymphs Abs: 0.9 10*3/uL (ref 0.7–4.0)
MCH: 28.2 pg (ref 26.0–34.0)
MCHC: 31.1 g/dL (ref 30.0–36.0)
MCV: 90.9 fL (ref 80.0–100.0)
Monocytes Absolute: 0.1 10*3/uL (ref 0.1–1.0)
Monocytes Relative: 1 %
Neutro Abs: 4.3 10*3/uL (ref 1.7–7.7)
Neutrophils Relative %: 81 %
Platelets: 166 10*3/uL (ref 150–400)
RBC: 4.18 MIL/uL (ref 3.87–5.11)
RDW: 15.3 % (ref 11.5–15.5)
WBC: 5.3 10*3/uL (ref 4.0–10.5)
nRBC: 0 % (ref 0.0–0.2)

## 2019-10-26 LAB — IRON AND TIBC
Iron: 67 ug/dL (ref 28–170)
Saturation Ratios: 16 % (ref 10.4–31.8)
TIBC: 409 ug/dL (ref 250–450)
UIBC: 342 ug/dL

## 2019-10-26 LAB — FOLATE: Folate: 31.2 ng/mL (ref >5.9)

## 2019-10-26 LAB — FERRITIN: Ferritin: 84 ng/mL (ref 11–307)

## 2019-10-26 LAB — VITAMIN B12: Vitamin B-12: 371 pg/mL (ref 180–914)

## 2019-10-26 LAB — TSH: TSH: 0.16 u[IU]/mL — ABNORMAL LOW (ref 0.350–4.500)

## 2019-10-26 LAB — LACTATE DEHYDROGENASE: LDH: 175 U/L (ref 98–192)

## 2019-10-26 MED ORDER — IOHEXOL 300 MG/ML  SOLN
100.0000 mL | Freq: Once | INTRAMUSCULAR | Status: AC | PRN
  Administered 2019-10-26: 100 mL via INTRAVENOUS

## 2019-11-04 ENCOUNTER — Inpatient Hospital Stay (HOSPITAL_BASED_OUTPATIENT_CLINIC_OR_DEPARTMENT_OTHER): Payer: 59 | Admitting: Hematology

## 2019-11-04 ENCOUNTER — Encounter (HOSPITAL_COMMUNITY): Payer: Self-pay | Admitting: Hematology

## 2019-11-04 DIAGNOSIS — Z85850 Personal history of malignant neoplasm of thyroid: Secondary | ICD-10-CM | POA: Diagnosis not present

## 2019-11-04 DIAGNOSIS — Z85528 Personal history of other malignant neoplasm of kidney: Secondary | ICD-10-CM

## 2019-11-04 NOTE — Progress Notes (Signed)
Virtual Visit via Telephone Note  I connected with Rebecca Scott on 11/04/19 at 11:30 AM EST by telephone and verified that I am speaking with the correct person using two identifiers.   I discussed the limitations, risks, security and privacy concerns of performing an evaluation and management service by telephone and the availability of in person appointments. I also discussed with the patient that there may be a patient responsible charge related to this service. The patient expressed understanding and agreed to proceed.   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  She also had a history of thyroid cancer for which she underwent thyroidectomy and radioactive iron treatment.   Observations/Objective: She denies any major changes in her general health.  She has chronic pain in the back which is unchanged.  Denies any hematuria.  Denies any fevers, night sweats or weight loss in the last 6 months.  She reported that she recently moved to [facility].  Assessment and Plan:  1.  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 -We reviewed results of the CT of the abdomen and pelvis from 10/26/2019.  No findings to suggest recurrent or metastatic disease in the abdomen or pelvis.  Colonic diverticulosis without evidence of acute diverticulitis.  Bilateral renal cysts which are simple cysts and stable. -She will have repeat CT imaging in a year.  She will come back in 6 months for follow-up. -She is planning to switch to a local doctor for follow-up.  She will call us and let us know.  2.  Thyroid cancer: -She underwent thyroidectomy and radioactive iodine treatment. -Synthroid was increased to 125 mcg on 07/30/2019. -Latest TSH is 0.160.  She is  following up with endocrinology.  3.  Normocytic anemia: -CBC from 10/26/2019 shows hemoglobin 11.8, hematocrit 38, normal white count and platelet count.  MCV is 09.8. -J19 and folic acid were normal.  Ferritin was 84 and percent saturation was 16.  4.  Family history: -Because of her extensive family history, she met with our genetic counselor. -She did not have any known mutations.    Follow Up Instructions: RTC 6 months with labs and physical exam.   I discussed the assessment and treatment plan with the patient. The patient was provided an opportunity to ask questions and all were answered. The patient agreed with the plan and demonstrated an understanding of the instructions.   The patient was advised to call back or seek an in-person evaluation if the symptoms worsen or if the condition fails to improve as anticipated.  I provided 11 minutes of non-face-to-face time during this encounter.   Derek Jack, MD

## 2019-12-04 ENCOUNTER — Telehealth: Payer: Self-pay | Admitting: Cardiology

## 2019-12-04 NOTE — Telephone Encounter (Signed)
Patient is now living in [place], [place]

## 2019-12-05 ENCOUNTER — Other Ambulatory Visit: Payer: Self-pay | Admitting: Cardiology

## 2019-12-30 ENCOUNTER — Telehealth: Payer: Self-pay | Admitting: "Endocrinology

## 2019-12-30 NOTE — Telephone Encounter (Signed)
She has to  do the Thyroglobulin and Thyroglobulin antibodies on day of the Whole Body Scan. She may d the TSH and Free T4 at the same time to avoid another trip.

## 2019-12-30 NOTE — Telephone Encounter (Signed)
Patient said she sees Dr Dorris Fetch on 5/20 and has her thyroid injection on 5/5 and 5/6 then will take the pill on 5/7 then has whole body scan on 5/10. When does she do her labs?

## 2020-01-01 NOTE — Telephone Encounter (Signed)
Discussed with pt, understanding voiced. 

## 2020-01-01 NOTE — Telephone Encounter (Signed)
Left a message requesting a return call to the office. 

## 2020-01-06 IMAGING — DX DG ABDOMEN 1V
1 series · 1 of 1 positions shown · non-contrast
Comparison: CT abdomen pelvis 06/27/2017

CLINICAL DATA: Dark brown urine.  History of renal cell carcinoma

EXAM:
ABDOMEN - 1 VIEW

[abdomen kub]
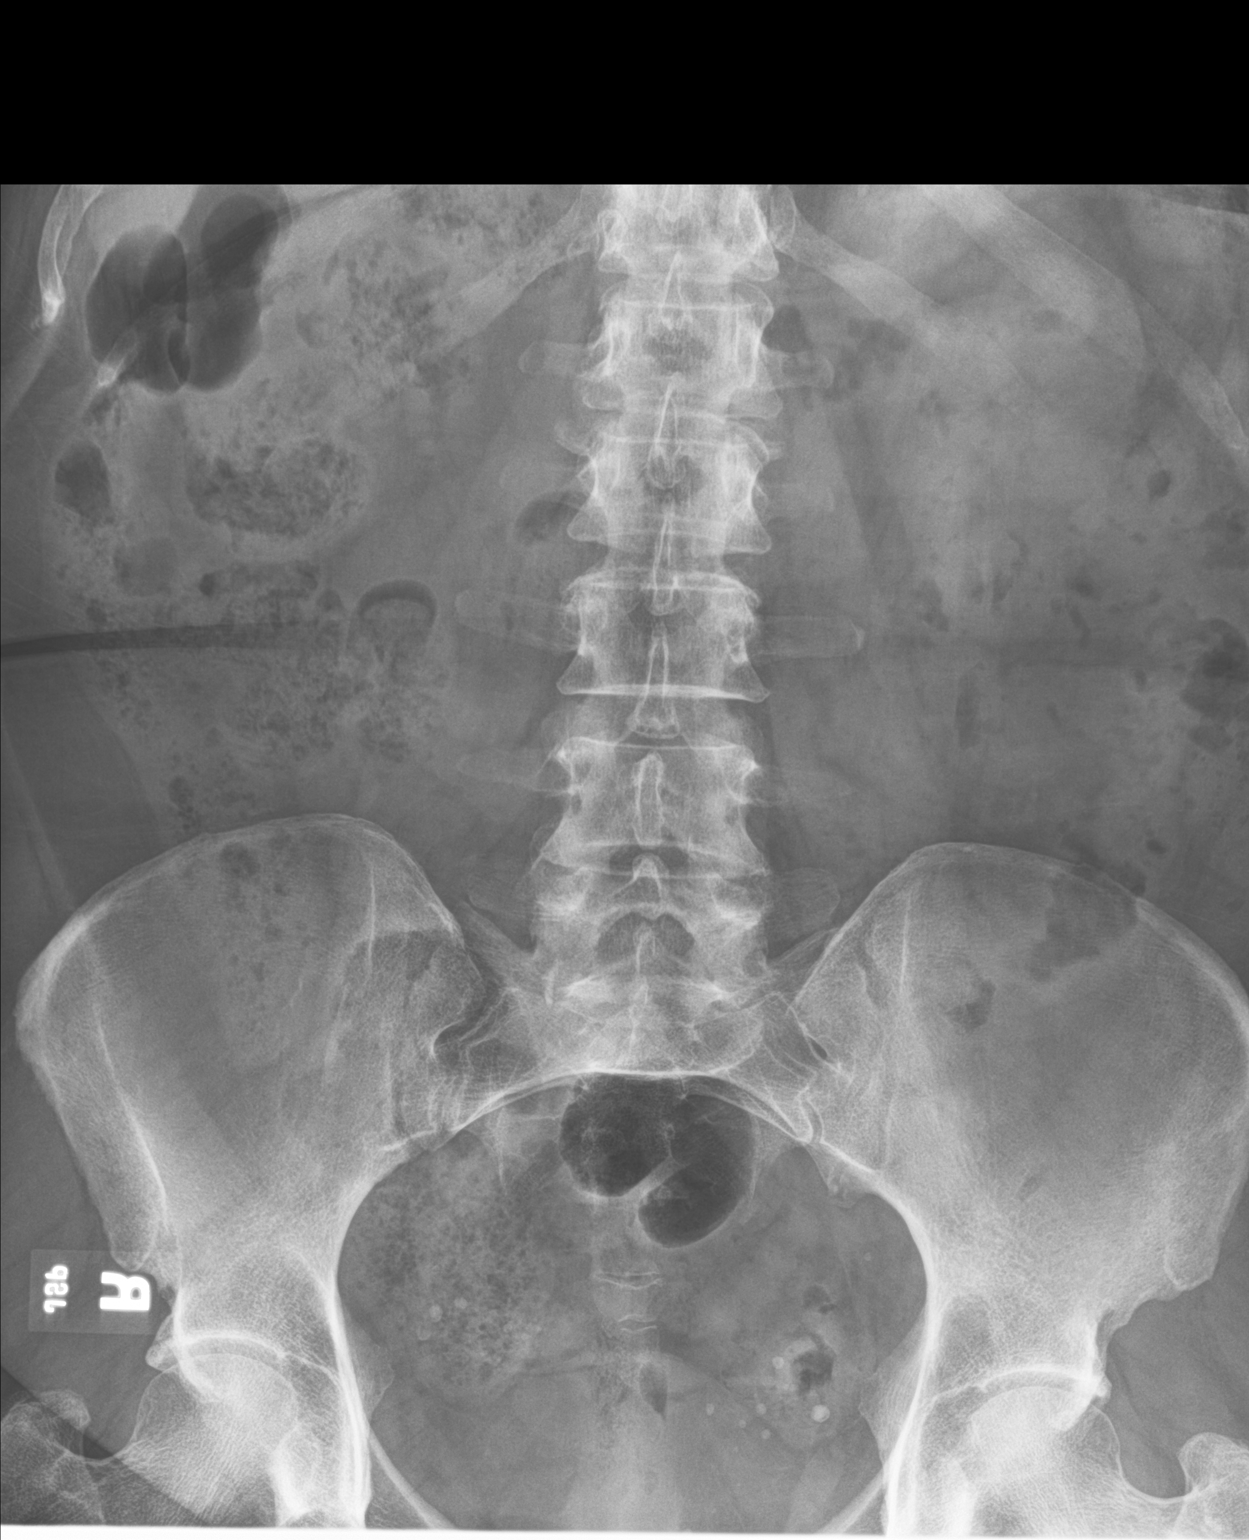

[1 of 1 positions shown; findings below may reference images not displayed]

FINDINGS: Moderate stool in the colon especially the right colon. No
obstruction of the bowel. Surgical clips right upper quadrant. No
acute skeletal abnormality. No urinary tract calculi
IMPRESSION: Moderate stool in the colon without obstruction or ileus.

## 2020-01-11 ENCOUNTER — Telehealth: Payer: Self-pay | Admitting: "Endocrinology

## 2020-01-11 NOTE — Telephone Encounter (Signed)
Pt whole body scan cancelled pending approval from [facility].

## 2020-01-11 NOTE — Telephone Encounter (Signed)
Kylie with [facility] left a VM that her [facility] has denied her thyroid scan that is scheduled for this Wednesday. They need a call back asap [phone] ext [phone]

## 2020-01-13 ENCOUNTER — Telehealth: Payer: Self-pay

## 2020-01-13 ENCOUNTER — Other Ambulatory Visit: Payer: Self-pay

## 2020-01-13 ENCOUNTER — Other Ambulatory Visit (HOSPITAL_COMMUNITY): Payer: Self-pay | Admitting: "Endocrinology

## 2020-01-13 ENCOUNTER — Encounter (HOSPITAL_COMMUNITY): Payer: 59

## 2020-01-13 ENCOUNTER — Encounter (HOSPITAL_COMMUNITY): Payer: Self-pay

## 2020-01-13 DIAGNOSIS — E890 Postprocedural hypothyroidism: Secondary | ICD-10-CM

## 2020-01-13 NOTE — Telephone Encounter (Signed)
Pt's NM whole body scan rescheduled for May 19th @ 8:00a.m.each day, 20th, 21st and 24th. [employer] case [id number], authorization [id number]. Pt made aware.

## 2020-01-14 ENCOUNTER — Encounter (HOSPITAL_COMMUNITY): Payer: Self-pay

## 2020-01-14 ENCOUNTER — Other Ambulatory Visit (HOSPITAL_COMMUNITY): Payer: 59

## 2020-01-15 ENCOUNTER — Other Ambulatory Visit (HOSPITAL_COMMUNITY): Payer: 59

## 2020-01-18 ENCOUNTER — Other Ambulatory Visit (HOSPITAL_COMMUNITY): Payer: 59

## 2020-01-26 ENCOUNTER — Ambulatory Visit (INDEPENDENT_AMBULATORY_CARE_PROVIDER_SITE_OTHER): Payer: 59 | Admitting: Internal Medicine

## 2020-01-27 ENCOUNTER — Other Ambulatory Visit: Payer: Self-pay

## 2020-01-27 ENCOUNTER — Encounter (HOSPITAL_COMMUNITY)
Admission: RE | Admit: 2020-01-27 | Discharge: 2020-01-27 | Disposition: A | Discharge status: Home / Self Care | Payer: 59 | Source: Ambulatory Visit | Attending: "Endocrinology | Admitting: "Endocrinology

## 2020-01-27 DIAGNOSIS — C73 Malignant neoplasm of thyroid gland: Secondary | ICD-10-CM | POA: Diagnosis not present

## 2020-01-27 MED ORDER — THYROTROPIN ALFA 1.1 MG IM SOLR
INTRAMUSCULAR | Status: AC
  Administered 2020-01-27: 0.9 mg via INTRAMUSCULAR
  Filled 2020-01-27: qty 0.9

## 2020-01-27 MED ORDER — THYROTROPIN ALFA 1.1 MG IM SOLR: 0.9000 mg | INTRAMUSCULAR | Status: AC

## 2020-01-27 MED ORDER — STERILE WATER FOR INJECTION IJ SOLN
INTRAMUSCULAR | Status: AC
  Filled 2020-01-27: qty 10

## 2020-01-28 ENCOUNTER — Ambulatory Visit: Payer: 59 | Admitting: "Endocrinology

## 2020-01-28 ENCOUNTER — Encounter (HOSPITAL_COMMUNITY)
Admission: RE | Admit: 2020-01-28 | Discharge: 2020-01-28 | Disposition: A | Discharge status: Home / Self Care | Payer: 59 | Source: Ambulatory Visit | Attending: "Endocrinology | Admitting: "Endocrinology

## 2020-01-28 DIAGNOSIS — C73 Malignant neoplasm of thyroid gland: Secondary | ICD-10-CM | POA: Diagnosis not present

## 2020-01-28 MED ORDER — THYROTROPIN ALFA 1.1 MG IM SOLR
INTRAMUSCULAR | Status: AC
  Filled 2020-01-28: qty 0.9

## 2020-01-28 MED ORDER — THYROTROPIN ALFA 1.1 MG IM SOLR
0.9000 mg | INTRAMUSCULAR | Status: AC
  Administered 2020-01-28: 0.9 mg via INTRAMUSCULAR

## 2020-01-28 MED ORDER — STERILE WATER FOR INJECTION IJ SOLN
INTRAMUSCULAR | Status: AC
  Filled 2020-01-28: qty 10

## 2020-01-29 ENCOUNTER — Other Ambulatory Visit: Payer: Self-pay

## 2020-01-29 ENCOUNTER — Encounter (HOSPITAL_COMMUNITY)
Admission: RE | Admit: 2020-01-29 | Discharge: 2020-01-29 | Disposition: A | Discharge status: Home / Self Care | Payer: 59 | Source: Ambulatory Visit | Attending: "Endocrinology | Admitting: "Endocrinology

## 2020-01-29 ENCOUNTER — Other Ambulatory Visit (HOSPITAL_COMMUNITY)
Admission: RE | Admit: 2020-01-29 | Discharge: 2020-01-29 | Disposition: A | Discharge status: Home / Self Care | Payer: 59 | Source: Ambulatory Visit | Attending: "Endocrinology | Admitting: "Endocrinology

## 2020-01-29 DIAGNOSIS — E890 Postprocedural hypothyroidism: Secondary | ICD-10-CM | POA: Diagnosis not present

## 2020-01-29 LAB — T4, FREE: Free T4: 1.07 ng/dL (ref 0.61–1.12)

## 2020-01-29 LAB — TSH: TSH: 116.832 u[IU]/mL — ABNORMAL HIGH (ref 0.350–4.500)

## 2020-01-29 MED ORDER — SODIUM IODIDE I 131 CAPSULE
4.0000 | Freq: Once | INTRAVENOUS | Status: AC | PRN
  Administered 2020-01-29: 4.35 via ORAL

## 2020-01-30 LAB — THYROGLOBULIN ANTIBODY: Thyroglobulin Antibody: <1 IU/mL (ref 0.0–0.9)

## 2020-02-01 ENCOUNTER — Other Ambulatory Visit: Payer: Self-pay

## 2020-02-01 ENCOUNTER — Encounter (HOSPITAL_COMMUNITY)
Admission: RE | Admit: 2020-02-01 | Discharge: 2020-02-01 | Disposition: A | Discharge status: Home / Self Care | Payer: 59 | Source: Ambulatory Visit | Attending: "Endocrinology | Admitting: "Endocrinology

## 2020-02-04 LAB — THYROGLOBULIN LEVEL: Thyroglobulin: <2 ng/mL

## 2020-02-19 ENCOUNTER — Other Ambulatory Visit: Payer: Self-pay

## 2020-02-19 ENCOUNTER — Ambulatory Visit (INDEPENDENT_AMBULATORY_CARE_PROVIDER_SITE_OTHER): Payer: 59 | Admitting: "Endocrinology

## 2020-02-19 VITALS — BP 133/82 | HR 69 | Ht 68.0 in | Wt 214.6 lb

## 2020-02-19 DIAGNOSIS — C73 Malignant neoplasm of thyroid gland: Secondary | ICD-10-CM | POA: Diagnosis not present

## 2020-02-19 DIAGNOSIS — E890 Postprocedural hypothyroidism: Secondary | ICD-10-CM | POA: Diagnosis not present

## 2020-02-19 NOTE — Progress Notes (Signed)
Endocrinology follow-up note                                            02/19/2020, 11:06 AM   Subjective:    Patient ID: Rebecca Scott, female    DOB: [birth year], PCP Henderson Baltimore, MD   Past Medical History:  Diagnosis Date  . Anemia   . Anxiety   . Arthritis   . Asthma    seasonal asthma rarely   . Chronic kidney disease    renal mass right kidney   . CPAP (continuous positive airway pressure) dependence   . Diverticulitis   . Family history of cancer of extrahepatic bile ducts   . Family history of colon cancer   . Family history of kidney cancer   . Family history of melanoma   . GERD (gastroesophageal reflux disease)   . Hemorrhoids   . History of kidney stones   . HTN (hypertension) 07/29/2014  . Hyperlipemia   . Hypertension   . Migraine    no migraines now, will have stress headaches  . Multinodular goiter 08/21/2017   Last Assessment & Plan:  She will need an f/u u/s of thyroid to reassess nodules. Repeat in 07/2018 Nodules are too small for biopsy now.  . 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 Papillary thyroid carcinoma ([place]) 03/10/2018  . PONV (postoperative nausea and vomiting)    patient reports being "slower to wake than average "   . Pre-diabetes    "ive been told i'm borderline"   . Sleep apnea    CPAP use    Past Surgical History:  Procedure Laterality Date  . ABDOMINAL HYSTERECTOMY    . APPENDECTOMY  1978  . BIOPSY  07/08/2017   Procedure: BIOPSY;  Surgeon: Rogene Houston, MD;  [facility];  Service: Endoscopy;;  gastric  . CHOLECYSTECTOMY  2005  . COLONOSCOPY WITH PROPOFOL N/A 10/28/2017   Procedure: COLONOSCOPY WITH PROPOFOL;  Surgeon: Rogene Houston, MD;  [facility];  Service: Endoscopy;  Laterality: N/A;  7:30  . ESOPHAGOGASTRODUODENOSCOPY N/A 07/08/2017   Procedure: ESOPHAGOGASTRODUODENOSCOPY (EGD);  Surgeon: Rogene Houston, MD;  [facility];  Service: Endoscopy;  Laterality: N/A;  220  . LUMBAR LAMINECTOMY/DECOMPRESSION MICRODISCECTOMY N/A 09/26/2018   Procedure: L4-5 MICRODISCECTOMY;  Surgeon: [name], MD;  Location: [place];  Service: Orthopedics;  Laterality: N/A;  . NASAL SEPTUM SURGERY    . ROBOTIC ASSITED PARTIAL NEPHRECTOMY Right 05/17/2017   Procedure: XI ROBOTIC ASSITED PARTIAL NEPHRECTOMY;  Surgeon: Alexis Frock, MD;  Location: [facility];  Service: Urology;  Laterality: Right;  . Ruptured Disk L5    . THYROIDECTOMY N/A 03/10/2018   Procedure: TOTAL THYROIDECTOMY;  Surgeon: Aviva Signs, MD;  Location: [facility];  Service: General;  Laterality: N/A;   Social History   Socioeconomic History  . Marital status: Widowed    Spouse name: Not on file  . Number of children: Not on file  . Years of education: Not on file  . Highest education level: Not on file  Occupational History  . Not on file  Tobacco Use  . Smoking status: Never Smoker  . Smokeless tobacco: Never Used  Vaping Use  . Vaping Use: Never used  Substance and Sexual Activity  .  Alcohol use: Yes    Comment: rarely   . Drug use: No  . Sexual activity: Not Currently  Other Topics Concern  . Not on file  Social History Narrative  . Not on file   Social Determinants of Health   Financial Resource Strain:   . Difficulty of Paying Living Expenses:   Food Insecurity:   . Worried About Charity fundraiser in the Last Year:   . Arboriculturist in the Last Year:   Transportation Needs:   . Film/video editor (Medical):   Marland Kitchen Lack of Transportation (Non-Medical):   Physical Activity:   . Days of Exercise per Week:   . Minutes of Exercise per Session:   Stress:   . Feeling of Stress :   Social Connections:   . Frequency of Communication with Friends and Family:   . Frequency of Social Gatherings with Friends and Family:   . Attends Religious Services:   . Active Member of Clubs or Organizations:   . Attends Archivist Meetings:    Marland Kitchen Marital Status:    Outpatient Encounter Medications as of 02/19/2020  Medication Sig  . aspirin EC 81 MG tablet Take 81 mg by mouth daily.  . clonazePAM (KLONOPIN) 0.5 MG tablet Take 0.5 mg by mouth 2 (two) times daily as needed for anxiety.   . diclofenac Sodium (VOLTAREN) 1 % GEL Apply 2 g topically as needed. As needed.  . famotidine (PEPCID) 20 MG tablet Take 20 mg by mouth at bedtime.   Marland Kitchen GLUCOSAMINE-CHONDROITIN PO Take 1 capsule by mouth daily.   . hydrochlorothiazide (HYDRODIURIL) 25 MG tablet Take 0.5 tablets (12.5 mg total) by mouth daily.  Marland Kitchen HYDROcodone-acetaminophen (NORCO/VICODIN) 5-325 MG tablet Take 1 tablet by mouth every 6 (six) hours as needed for moderate pain. (Patient not taking: Reported on 11/04/2019)  . levothyroxine (SYNTHROID) 125 MCG tablet Take 1 tablet (125 mcg total) by mouth daily before breakfast.  . losartan (COZAAR) 100 MG tablet TAKE 1 TABLET ONCE DAILY.  . methocarbamol (ROBAXIN) 500 MG tablet Take 1 tablet (500 mg total) by mouth every 8 (eight) hours as needed for muscle spasms. (Patient not taking: Reported on 11/04/2019)  . Multiple Vitamin (MULTI-DAY PO) Take by mouth daily.  . nortriptyline (PAMELOR) 10 MG capsule TAKE 1-4 TABLETS NIGHTLY AS INSTRUCTED  . potassium chloride SA (K-DUR,KLOR-CON) 20 MEQ tablet TAKE 1 TABLET TWICE DAILY FOR 3 DAYS THEN 1 TABLET DAILY.  . simvastatin (ZOCOR) 40 MG tablet Take 40 mg by mouth daily at 6 PM.   . [DISCONTINUED] dicyclomine (BENTYL) 10 MG capsule TAKE 1 CAPSULE BY MOUTH IN THE MORNING, 2 CAPSULES AT NOON AND 1 CAPSULE AT BEDTIME.  . [DISCONTINUED] diphenhydrAMINE (BENADRYL) 50 MG tablet Take 1 tablet (50 mg total) by mouth once for 1 dose. Take 50mg  1 hour before your scan (Patient not taking: Reported on 11/04/2019)  . [DISCONTINUED] metoprolol tartrate (LOPRESSOR) 25 MG tablet TAKE 1/2 TABLET BY MOUTH AT BEDTIME  . [DISCONTINUED] pantoprazole (PROTONIX) 40 MG tablet TAKE 1 TABLET BY MOUTH EVERY DAY BEFORE  BREAKFAST  . [DISCONTINUED] predniSONE (DELTASONE) 50 MG tablet Take 50mg  13 hours, 7 hours and 1 hour before your scan. (Patient not taking: Reported on 11/04/2019)   No facility-administered encounter medications on file as of 02/19/2020.   ALLERGIES: Allergies  Allergen Reactions  . Iodinated Diagnostic Agents Hives and Itching  . Mango Flavor Swelling and Rash    Tongue swelling, feels like throat is closing  in on her   . Isovue [Iopamidol] Hives and Itching    And hives   . Nitrofurantoin Hives  . Other Hives, Itching, Rash and Other (See Comments)    Use Paper tape ONLY  . Sulfa Antibiotics Hives  . Adhesive [Tape] Rash and Other (See Comments)    Use Paper tape ONLY    VACCINATION STATUS: Immunization History  Administered Date(s) Administered  . Influenza,inj,Quad PF,6+ Mos 06/06/2018  . Influenza-Unspecified 06/24/2017, 06/11/2019  . Tdap 09/16/2018    HPI LORIANNE MALBROUGH is 64 y.o. female who is returning for follow-up after recent near total thyroidectomy for thyroid malignancy, and postsurgical hypothyroidism.    -She underwent near total thyroidectomy on March 10, 2018 which revealed bilateral follicular variant papillary thyroid carcinoma Pathologic Stage Classification (pTNM, AJCC 8th Edition): pT2(multifocal), pNX.  She is currently on Synthroid 125 mcg p.o. daily before breakfast.  she reports compliance. -She has recovered from her surgery very well.  She denies dysphagia, odynophagia, voice change, nor shortness of breath.  Her most recent thyroid instrument whole-body scan shows a single focus of increased radiotracer uptake-detailed below.   Her prior history:  -She was found to have multinodular goiter in November 2018.  At that time she was found to have 2.4 cm nodule in the 5 cm right lobe of thyroid and 1.5 cm nodule in the 4.3 cm left lobe.  Repeat ultrasound in April 2019 showed thyroid remaining more or less the same size however the nodules were  observed to grow to 2.7 cm on the right lobe and 1.7 cm in the left lobe.  Subsequently, earlier this month she underwent fine-needle aspiration of the right lobe nodule which showed follicular lesion of undetermined significance.  Molecular studies were not sent.   -Prior to her surgery, patient complained of  foreign body sensation and occasional choking in her neck while eating and breathing problem when she was laying on her back.   -She denies any exposure to neck radiation.  She denies family history of thyroid cancer, however multiple family members with what appears to be hypothyroidism.  She denies heat/cold intolerance.   -Her previsit thyroid/neck ultrasound is consistent with surgical changes with no evidence of residual or recurrent thyroid malignancy. -She is currently on levothyroxine 125 mcg p.o. daily.  Reports good compliance.    Her previsit thyroid function tests are consistent with appropriate replacement with thyroid hormone.    Review of Systems Limited as above.  Objective:    BP 133/82   Pulse 69   Ht 5\' 8"  (1.727 m)   Wt 214 lb 9.6 oz (97.3 kg)   BMI 32.63 kg/m   Wt Readings from Last 3 Encounters:  02/19/20 214 lb 9.6 oz (97.3 kg)  07/28/19 208 lb 4.8 oz (94.5 kg)  05/20/19 204 lb (92.5 kg)     Diabetic Labs (most recent): Lab Results  Component Value Date   HGBA1C 5.1 09/08/2018   HGBA1C 5.4 03/06/2018   HGBA1C 5.7 (H) 05/09/2017     Recent Results (from the past 2160 hour(s))  Thyroglobulin     Status: None   Collection Time: 01/29/20  8:22 AM  Result Value Ref Range   Thyroglobulin <2.0 ng/mL    Comment: (NOTE) This test was developed and its performance characteristics determined by [facility]. It has not been cleared or approved by the Food and Drug Administration. Reference Range: Pubertal Children and Adults: <40 According to the [facility] of Clinical Biochemistry, the reference interval  for Thyroglobulin (TG) should be related  to euthyroid patients and not for patients who underwent thyroidectomy.  TG reference intervals for these patients depend on the residual mass of the thyroid tissue left after surgery.  Establishing a post-operative baseline is recommended.  The assay quantitation limit is 2.0 ng/mL. Performed At: [place], [place] [id number] Jake Bathe F MD [id number]   Thyroglobulin antibody     Status: None   Collection Time: 01/29/20  8:22 AM  Result Value Ref Range   Thyroglobulin Antibody <1.0 0.0 - 0.9 IU/mL    Comment: (NOTE) Thyroglobulin Antibody measured by [facility] Methodology Performed At: [facility] [place], [place] [id number] Rush Farmer MD [id number]   T4, free     Status: None   Collection Time: 01/29/20  8:22 AM  Result Value Ref Range   Free T4 1.07 0.61 - 1.12 ng/dL    Comment: (NOTE) Biotin ingestion may interfere with free T4 tests. If the results are inconsistent with the TSH level, previous test results, or the clinical presentation, then consider biotin interference. If needed, order repeat testing after stopping biotin. Performed at [facility], [place] [address] [place], [place] [address]   TSH     Status: Abnormal   Collection Time: 01/29/20  8:22 AM  Result Value Ref Range   TSH 116.832 (H) 0.350 - 4.500 uIU/mL    Comment: Performed by a 3rd Generation assay with a functional sensitivity of <=0.01 uIU/mL. Performed at [facility], [address], [place], [place] [address]        Diagnosis 1. Thyroid, lobectomy, left lobe and isthmus - PAPILLARY THYROID CARCINOMA, FOLLICULAR VARIANT, 1.3 CM. - TUMOR CONFINED WITHIN THYROID CAPSULE. - MARGINS NOT INVOLVED. 2. Thyroid, lobectomy, right lobe - PAPILLARY THYROID CARCINOMA, FOLLICULAR VARIANT, 2.6 CM. - TUMOR FOCALLY LESS THAN 0.1 CM FROM MARGIN.   Pathologic Stage Classification (pTNM, AJCC 8th Edition): pT2(multifocal),  pNX.   Thyrogen stimulated whole-body scan on June 23, 2018 FINDINGS: Radio iodine accumulation is seen at the thyroid bed bilaterally consistent with thyroid remnant. No distant sites of abnormal radio iodine accumulation are identified to suggest iodine-avid metastatic thyroid cancer.  IMPRESSION: Thyroid remnant.  No scintigraphic evidence of iodine-avid thyroid cancer metastases.  Thyroid/neck ultrasound for Jan 13, 2019- consistent with surgical changes with absence of residual or recurrent thyroid disease.    Most recent Thyrogen stimulated whole-body scan on Feb 01, 2020 FINDINGS: A single small focus of increased radiotracer uptake within the lower left neck.  Normal physiologic uptake identified within the oropharynx and GI tract.  IMPRESSION: Single, small focus of increased radiotracer uptake noted within the left lower lobe neck worrisome for residual/recurrent functioning thyroid tissue.    Assessment & Plan:   1.  Follicular variant of papillary thyroid cancer  -She is status post negative her thyroidectomy on March 10, 2018 with Dr. Aviva Signs resulting in  Pathologic Stage Classification (pTNM, AJCC 8th Edition): pT2(multifocal), pNX.  -She he is status post radioactive iodine remnant ablation followed by whole body scan which was completed on June 24, 2018 at [facility].  -The whole body scan revealed some thyroid remnant in the thyroid bed, no evidence of distant metastasis.  Her previsit thyroid/neck ultrasound is consistent with surgical changes of absent residual or recurrent thyroid disease. -She willd not need any further intervention at this time, -I had a long discussion with her regarding the need for continued  monitoring with yearly imaging studies for next 5-10 years.  Her thyroglobulin levels are low at <0.1 with negative thyroglobulin antibodies.  -Her recent Thyrogen stimulated whole-body scan showed single, small focus of  increased radiotracer uptake within the left lower lobe worrisome for residual/recurrent functioning thyroid tissue.    -She will be sent for CT neck/thyroid with contrast for better catheterization.  If she is found to have anatomic lesion, she will be sent for surgery.  Patient with history of contrast allergy, prophylactic premedication is requested.     2.  Postsurgical hypothyroidism  -Her previsit thyroid function tests are consistent with appropriate replacement and suppression of TSH.  The elevated TSH is erroneous due to rhTSH injection prior to whole-body scan.   -She is advised to continue Synthroid 125 mcg daily before breakfast.   - We discussed about the correct intake of her thyroid hormone, on empty stomach at fasting, with water, separated by at least 30 minutes from breakfast and other medications,  and separated by more than 4 hours from calcium, iron, multivitamins, acid reflux medications (PPIs). -Patient is made aware of the fact that thyroid hormone replacement is needed for life, dose to be adjusted by periodic monitoring of thyroid function tests.   - I advised her  to maintain close follow up with Henderson Baltimore, MD for primary care needs.     - Time spent on this patient care encounter:  30 minutes of which 50% was spent in  counseling and the rest reviewing  her current and  previous labs / studies and medications  doses and developing a plan for long term care. Rebecca Scott  participated in the discussions, expressed understanding, and voiced agreement with the above plans.  All questions were answered to her satisfaction. she is encouraged to contact clinic should she have any questions or concerns prior to her return visit.    Follow up plan: Return in about 3 weeks (around 03/11/2020), or with CT and labs, for F/U with Pre-visit Labs.   Glade Lloyd, MD [facility] [address] [place],  [place] [address] Phone: [phone]  Fax: [phone]     02/19/2020, 11:06 AM  This note was partially dictated with voice recognition software. Similar sounding words can be transcribed inadequately or may not  be corrected upon review.

## 2020-02-24 ENCOUNTER — Encounter: Payer: Self-pay | Admitting: "Endocrinology

## 2020-02-24 ENCOUNTER — Telehealth: Payer: Self-pay | Admitting: "Endocrinology

## 2020-02-24 NOTE — Telephone Encounter (Signed)
Pt states she has a soft tissue CT schedule in July and needs to have her prednisone and benadryl called in for this.

## 2020-02-25 ENCOUNTER — Other Ambulatory Visit: Payer: Self-pay | Admitting: "Endocrinology

## 2020-02-25 MED ORDER — PREDNISONE 10 MG PO TABS
10.0000 mg | ORAL_TABLET | Freq: Every day | ORAL | 0 refills | Status: DC
Start: 2020-02-25 — End: 2020-10-28

## 2020-02-25 MED ORDER — DIPHENHYDRAMINE HCL 25 MG PO TABS: 25.0000 mg | ORAL_TABLET | Freq: Every day | ORAL | 0 refills | Status: DC

## 2020-02-25 NOTE — Telephone Encounter (Signed)
Done

## 2020-03-01 ENCOUNTER — Other Ambulatory Visit: Payer: Self-pay | Admitting: "Endocrinology

## 2020-03-25 ENCOUNTER — Other Ambulatory Visit: Payer: Self-pay

## 2020-03-25 ENCOUNTER — Ambulatory Visit (HOSPITAL_COMMUNITY)
Admission: RE | Admit: 2020-03-25 | Discharge: 2020-03-25 | Disposition: A | Discharge status: Home / Self Care | Payer: 59 | Source: Ambulatory Visit | Attending: "Endocrinology | Admitting: "Endocrinology

## 2020-03-25 DIAGNOSIS — C73 Malignant neoplasm of thyroid gland: Secondary | ICD-10-CM | POA: Insufficient documentation

## 2020-03-25 LAB — T4, FREE: Free T4: 1.3 ng/dL (ref 0.8–1.8)

## 2020-03-25 LAB — TSH: TSH: 0.18 mIU/L — ABNORMAL LOW (ref 0.40–4.50)

## 2020-03-25 LAB — POCT I-STAT CREATININE: Creatinine, Ser: 0.7 mg/dL (ref 0.44–1.00)

## 2020-03-25 MED ORDER — IOHEXOL 300 MG/ML  SOLN
75.0000 mL | Freq: Once | INTRAMUSCULAR | Status: AC | PRN
  Administered 2020-03-25: 75 mL via INTRAVENOUS

## 2020-03-29 ENCOUNTER — Other Ambulatory Visit: Payer: Self-pay

## 2020-03-29 ENCOUNTER — Ambulatory Visit (INDEPENDENT_AMBULATORY_CARE_PROVIDER_SITE_OTHER): Payer: 59 | Admitting: "Endocrinology

## 2020-03-29 ENCOUNTER — Other Ambulatory Visit: Payer: Self-pay | Admitting: Cardiology

## 2020-03-29 ENCOUNTER — Encounter: Payer: Self-pay | Admitting: "Endocrinology

## 2020-03-29 VITALS — BP 133/84 | HR 60 | Ht 68.0 in | Wt 214.8 lb

## 2020-03-29 DIAGNOSIS — E890 Postprocedural hypothyroidism: Secondary | ICD-10-CM

## 2020-03-29 DIAGNOSIS — C73 Malignant neoplasm of thyroid gland: Secondary | ICD-10-CM | POA: Diagnosis not present

## 2020-03-29 MED ORDER — LEVOTHYROXINE SODIUM 112 MCG PO TABS: 112.0000 ug | ORAL_TABLET | Freq: Every day | ORAL | 0 refills | Status: DC

## 2020-03-29 NOTE — Progress Notes (Signed)
Endocrinology follow-up note                                            03/29/2020, 7:12 PM   Subjective:    Patient ID: Rebecca Scott, female    DOB: October 26, 1955, PCP Henderson Baltimore, MD   Past Medical History:  Diagnosis Date  . Anemia   . Anxiety   . Arthritis   . Asthma    seasonal asthma rarely   . Chronic kidney disease    renal mass right kidney   . CPAP (continuous positive airway pressure) dependence   . Diverticulitis   . Family history of cancer of extrahepatic bile ducts   . Family history of colon cancer   . Family history of kidney cancer   . Family history of melanoma   . GERD (gastroesophageal reflux disease)   . Hemorrhoids   . History of kidney stones   . HTN (hypertension) 07/29/2014  . Hyperlipemia   . Hypertension   . Migraine    no migraines now, will have stress headaches  . Multinodular goiter 08/21/2017   Last Assessment & Plan:  She will need an f/u u/s of thyroid to reassess nodules. Repeat in 07/2018 Nodules are too small for biopsy now.  . Papillary renal cell carcinoma ([place]) 05/2017   s/p partial nephrectomy, right  . Papillary thyroid carcinoma ([place]) 03/10/2018  . PONV (postoperative nausea and vomiting)    patient reports being "slower to wake than average "   . Pre-diabetes    "ive been told i'm borderline"   . Sleep apnea    CPAP use    Past Surgical History:  Procedure Laterality Date  . ABDOMINAL HYSTERECTOMY    . APPENDECTOMY  1978  . BIOPSY  07/08/2017   Procedure: BIOPSY;  Surgeon: Rogene Houston, MD;  [facility];  Service: Endoscopy;;  gastric  . CHOLECYSTECTOMY  2005  . COLONOSCOPY WITH PROPOFOL N/A 10/28/2017   Procedure: COLONOSCOPY WITH PROPOFOL;  Surgeon: Rogene Houston, MD;  [facility];  Service: Endoscopy;  Laterality: N/A;  7:30  . ESOPHAGOGASTRODUODENOSCOPY N/A 07/08/2017   Procedure: ESOPHAGOGASTRODUODENOSCOPY (EGD);  Surgeon: Rogene Houston, MD;  [facility];  Service: Endoscopy;  Laterality: N/A;  220  . LUMBAR LAMINECTOMY/DECOMPRESSION MICRODISCECTOMY N/A 09/26/2018   Procedure: L4-5 MICRODISCECTOMY;  Surgeon: [name], MD;  Location: [facility];  Service: Orthopedics;  Laterality: N/A;  . NASAL SEPTUM SURGERY    . ROBOTIC ASSITED PARTIAL NEPHRECTOMY Right 05/17/2017   Procedure: XI ROBOTIC ASSITED PARTIAL NEPHRECTOMY;  Surgeon: Alexis Frock, MD;  Location: [facility];  Service: Urology;  Laterality: Right;  . Ruptured Disk L5    . THYROIDECTOMY N/A 03/10/2018   Procedure: TOTAL THYROIDECTOMY;  Surgeon: Aviva Signs, MD;  Location: [facility];  Service: General;  Laterality: N/A;   Social History   Socioeconomic History  . Marital status: Widowed    Spouse name: Not on file  . Number of children: Not on file  . Years of education: Not on file  . Highest education level: Not on file  Occupational History  . Not on file  Tobacco Use  . Smoking status: Never Smoker  . Smokeless tobacco: Never Used  Vaping Use  . Vaping Use: Never used  Substance and Sexual Activity  .  Alcohol use: Yes    Comment: rarely   . Drug use: No  . Sexual activity: Not Currently  Other Topics Concern  . Not on file  Social History Narrative  . Not on file   Social Determinants of Health   Financial Resource Strain:   . Difficulty of Paying Living Expenses:   Food Insecurity:   . Worried About Charity fundraiser in the Last Year:   . Arboriculturist in the Last Year:   Transportation Needs:   . Film/video editor (Medical):   Marland Kitchen Lack of Transportation (Non-Medical):   Physical Activity:   . Days of Exercise per Week:   . Minutes of Exercise per Session:   Stress:   . Feeling of Stress :   Social Connections:   . Frequency of Communication with Friends and Family:   . Frequency of Social Gatherings with Friends and Family:   . Attends Religious Services:   . Active Member of Clubs or Organizations:   . Attends Archivist Meetings:    Marland Kitchen Marital Status:    Outpatient Encounter Medications as of 03/29/2020  Medication Sig  . aspirin EC 81 MG tablet Take 81 mg by mouth daily.  . clonazePAM (KLONOPIN) 0.5 MG tablet Take 0.5 mg by mouth 2 (two) times daily as needed for anxiety.   . diclofenac Sodium (VOLTAREN) 1 % GEL Apply 2 g topically as needed. As needed.  . diphenhydrAMINE (BENADRYL ALLERGY) 25 MG tablet Take 1 tablet (25 mg total) by mouth at bedtime. 2 days before and 3 days after the contrast.  . famotidine (PEPCID) 20 MG tablet Take 20 mg by mouth at bedtime.   Marland Kitchen GLUCOSAMINE-CHONDROITIN PO Take 1 capsule by mouth daily.   . hydrochlorothiazide (HYDRODIURIL) 25 MG tablet TAKE 1/2 TABLET BY MOUTH DAILY  . HYDROcodone-acetaminophen (NORCO/VICODIN) 5-325 MG tablet Take 1 tablet by mouth every 6 (six) hours as needed for moderate pain. (Patient not taking: Reported on 11/04/2019)  . levothyroxine (SYNTHROID) 112 MCG tablet Take 1 tablet (112 mcg total) by mouth daily.  Marland Kitchen levothyroxine (SYNTHROID) 125 MCG tablet TAKE 1 TABLET (125 MCG TOTAL) BY MOUTH DAILY BEFORE BREAKFAST.  Marland Kitchen losartan (COZAAR) 100 MG tablet TAKE 1 TABLET ONCE DAILY.  . methocarbamol (ROBAXIN) 500 MG tablet Take 1 tablet (500 mg total) by mouth every 8 (eight) hours as needed for muscle spasms. (Patient not taking: Reported on 11/04/2019)  . Multiple Vitamin (MULTI-DAY PO) Take by mouth daily.  . nortriptyline (PAMELOR) 10 MG capsule TAKE 1-4 TABLETS NIGHTLY AS INSTRUCTED  . potassium chloride SA (K-DUR,KLOR-CON) 20 MEQ tablet TAKE 1 TABLET TWICE DAILY FOR 3 DAYS THEN 1 TABLET DAILY.  Marland Kitchen predniSONE (DELTASONE) 10 MG tablet Take 1 tablet (10 mg total) by mouth daily with breakfast. 3 days before  - 2 days after the contrast.  . simvastatin (ZOCOR) 40 MG tablet Take 40 mg by mouth daily at 6 PM.    No facility-administered encounter medications on file as of 03/29/2020.   ALLERGIES: Allergies  Allergen Reactions  . Iodinated Diagnostic Agents Hives and  Itching  . Mango Flavor Swelling and Rash    Tongue swelling, feels like throat is closing in on her   . Isovue [Iopamidol] Hives and Itching    And hives   . Nitrofurantoin Hives  . Other Hives, Itching, Rash and Other (See Comments)    Use Paper tape ONLY  . Sulfa Antibiotics Hives  . Adhesive [Tape] Rash and  Other (See Comments)    Use Paper tape ONLY    VACCINATION STATUS: Immunization History  Administered Date(s) Administered  . Influenza,inj,Quad PF,6+ Mos 06/06/2018  . Influenza-Unspecified 06/24/2017, 06/11/2019  . Tdap 09/16/2018    HPI CRYSTALLEE WERDEN is 64 y.o. female who is returning for follow-up after recent near total thyroidectomy for thyroid malignancy, and postsurgical hypothyroidism.    -She underwent near total thyroidectomy on March 10, 2018 which revealed bilateral follicular variant papillary thyroid carcinoma Pathologic Stage Classification (pTNM, AJCC 8th Edition): pT2(multifocal), pNX.  She is currently on Synthroid 125 mcg p.o. daily before breakfast.  she reports compliance. -She has recovered from her surgery very well.  She denies dysphagia, odynophagia, voice change, nor shortness of breath.  Her most recent thyroid instrument whole-body scan shows a single focus of increased radiotracer uptake-detailed below. -Before this visit, she underwent CT scan of her neck which did not show any definite lesion corresponding to the area of concern on nuclear medicine.    Her prior history:  -She was found to have multinodular goiter in November 2018.  At that time she was found to have 2.4 cm nodule in the 5 cm right lobe of thyroid and 1.5 cm nodule in the 4.3 cm left lobe.  Repeat ultrasound in April 2019 showed thyroid remaining more or less the same size however the nodules were observed to grow to 2.7 cm on the right lobe and 1.7 cm in the left lobe.  Subsequently, earlier this month she underwent fine-needle aspiration of the right lobe nodule which showed  follicular lesion of undetermined significance.  Molecular studies were not sent.   -Prior to her surgery, patient complained of  foreign body sensation and occasional choking in her neck while eating and breathing problem when she was laying on her back.   -She denies any exposure to neck radiation.  She denies family history of thyroid cancer, however multiple family members with what appears to be hypothyroidism.  She denies heat/cold intolerance.   -Her previsit thyroid/neck ultrasound is consistent with surgical changes with no evidence of residual or recurrent thyroid malignancy. -She is currently on levothyroxine 125 mcg p.o. daily.  Reports good compliance.  Her previsit labs are consistent with slight over replacement.  She complains of cephalic diaphoresis.  Review of Systems Limited as above.  Objective:    BP 133/84   Pulse 60   Ht 5\' 8"  (1.727 m)   Wt 214 lb 12.8 oz (97.4 kg)   BMI 32.66 kg/m   Wt Readings from Last 3 Encounters:  03/29/20 214 lb 12.8 oz (97.4 kg)  02/19/20 214 lb 9.6 oz (97.3 kg)  07/28/19 208 lb 4.8 oz (94.5 kg)     Diabetic Labs (most recent): Lab Results  Component Value Date   HGBA1C 5.1 09/08/2018   HGBA1C 5.4 03/06/2018   HGBA1C 5.7 (H) 05/09/2017     Recent Results (from the past 2160 hour(s))  Thyroglobulin     Status: None   Collection Time: 01/29/20  8:22 AM  Result Value Ref Range   Thyroglobulin <2.0 ng/mL    Comment: (NOTE) This test was developed and its performance characteristics determined by [facility]. It has not been cleared or approved by the Food and Drug Administration. Reference Range: Pubertal Children and Adults: <40 According to the [facility] of Clinical Biochemistry, the reference interval for Thyroglobulin (TG) should be related to euthyroid patients and not for patients who underwent thyroidectomy.  TG reference intervals for these  patients depend on the residual mass of the thyroid tissue left after  surgery.  Establishing a post-operative baseline is recommended.  The assay quantitation limit is 2.0 ng/mL. Performed At: [facility] [place], [place] [id number] Jake Bathe F MD [id number]   Thyroglobulin antibody     Status: None   Collection Time: 01/29/20  8:22 AM  Result Value Ref Range   Thyroglobulin Antibody <1.0 0.0 - 0.9 IU/mL    Comment: (NOTE) Thyroglobulin Antibody measured by [facility] Methodology Performed At: [facility] [place], [place] [id number] Rush Farmer MD [id number]   T4, free     Status: None   Collection Time: 01/29/20  8:22 AM  Result Value Ref Range   Free T4 1.07 0.61 - 1.12 ng/dL    Comment: (NOTE) Biotin ingestion may interfere with free T4 tests. If the results are inconsistent with the TSH level, previous test results, or the clinical presentation, then consider biotin interference. If needed, order repeat testing after stopping biotin. Performed at [facility], [place] [address], [place], [place] [address]   TSH     Status: Abnormal   Collection Time: 01/29/20  8:22 AM  Result Value Ref Range   TSH 116.832 (H) 0.350 - 4.500 uIU/mL    Comment: Performed by a 3rd Generation assay with a functional sensitivity of <=0.01 uIU/mL. Performed at [facility], [address], [place], [place] [address]   TSH     Status: Abnormal   Collection Time: 03/24/20  1:28 PM  Result Value Ref Range   TSH 0.18 (L) 0.40 - 4.50 mIU/L  T4, free     Status: None   Collection Time: 03/24/20  1:28 PM  Result Value Ref Range   Free T4 1.3 0.8 - 1.8 ng/dL  I-STAT creatinine     Status: None   Collection Time: 03/25/20  8:58 AM  Result Value Ref Range   Creatinine, Ser 0.70 0.44 - 1.00 mg/dL       Diagnosis 1. Thyroid, lobectomy, left lobe and isthmus - PAPILLARY THYROID CARCINOMA, FOLLICULAR VARIANT, 1.3 CM. - TUMOR CONFINED WITHIN THYROID CAPSULE. - MARGINS NOT INVOLVED. 2. Thyroid,  lobectomy, right lobe - PAPILLARY THYROID CARCINOMA, FOLLICULAR VARIANT, 2.6 CM. - TUMOR FOCALLY LESS THAN 0.1 CM FROM MARGIN.   Pathologic Stage Classification (pTNM, AJCC 8th Edition): pT2(multifocal), pNX.   Thyrogen stimulated whole-body scan on June 23, 2018 FINDINGS: Radio iodine accumulation is seen at the thyroid bed bilaterally consistent with thyroid remnant. No distant sites of abnormal radio iodine accumulation are identified to suggest iodine-avid metastatic thyroid cancer.  IMPRESSION: Thyroid remnant.  No scintigraphic evidence of iodine-avid thyroid cancer metastases.  Thyroid/neck ultrasound for Jan 13, 2019- consistent with surgical changes with absence of residual or recurrent thyroid disease.    Most recent Thyrogen stimulated whole-body scan on Feb 01, 2020 FINDINGS: A single small focus of increased radiotracer uptake within the lower left neck.  Normal physiologic uptake identified within the oropharynx and GI tract.  IMPRESSION: Single, small focus of increased radiotracer uptake noted within the left lower lobe neck worrisome for residual/recurrent functioning thyroid tissue.   CT scan of neck on March 25, 2020: IMPRESSION: No mass or adenopathy. No definite finding corresponding to area of increased uptake on nuclear study.  Assessment & Plan:   1.  Follicular variant of papillary thyroid cancer  -She is status post negative her thyroidectomy on March 10, 2018 with Dr. Aviva Signs  resulting in  Pathologic Stage Classification (pTNM, AJCC 8th Edition): pT2(multifocal), pNX.  -She he is status post radioactive iodine remnant ablation followed by whole body scan which was completed on June 24, 2018 at [facility].  -The whole body scan revealed some thyroid remnant in the thyroid bed, no evidence of distant metastasis.  Her previsit thyroid/neck ultrasound is consistent with surgical changes of absent residual or recurrent  thyroid disease. -She willd not need any further intervention at this time, -I had a long discussion with her regarding the need for continued monitoring with yearly imaging studies for next 5-10 years.  Her thyroglobulin levels are low at <0.1 with negative thyroglobulin antibodies.  -Her recent Thyrogen stimulated whole-body scan showed single, small focus of increased radiotracer uptake within the left lower lobe worrisome for residual/recurrent functioning thyroid tissue.    -Her previsit CT neck/thyroid was unremarkable-no mass or adenopathy.  No definitive finding corresponding to the area of increased uptake on nuclear study.  She would not need intervention at this time, will have thyroid/neck ultrasound before her next visit in 6 months.    2.  Postsurgical hypothyroidism  -Her previsit thyroid function tests are consistent with slight over replacement.  This will have been favorable, however due to her complaint of cephalic diaphoresis, she is approached to lower her Synthroid to 112 mcg for the next 30 days for observation.  I discussed and prescribed levothyroxine 112 mcg p.o. daily before breakfast for the next 30 days.  She will likely return to her regular dose of levothyroxine at 125 mcg if she has not seen any change in her diaphoresis.     - We discussed about the correct intake of her thyroid hormone, on empty stomach at fasting, with water, separated by at least 30 minutes from breakfast and other medications,  and separated by more than 4 hours from calcium, iron, multivitamins, acid reflux medications (PPIs). -Patient is made aware of the fact that thyroid hormone replacement is needed for life, dose to be adjusted by periodic monitoring of thyroid function tests.   - I advised her  to maintain close follow up with Henderson Baltimore, MD for primary care needs.      - Time spent on this patient care encounter:  20 minutes of which 50% was spent in  counseling and the rest  reviewing  her current and  previous labs / studies and medications  doses and developing a plan for long term care. Rebecca Scott  participated in the discussions, expressed understanding, and voiced agreement with the above plans.  All questions were answered to her satisfaction. she is encouraged to contact clinic should she have any questions or concerns prior to her return visit.   Follow up plan: Return in about 6 months (around 09/29/2020) for F/U with Pre-visit Labs, Thyroid / Neck Ultrasound.   Glade Lloyd, MD [facility] [address] [place], [place] [address] Phone: [phone]  Fax: [phone]     03/29/2020, 7:12 PM  This note was partially dictated with voice recognition software. Similar sounding words can be transcribed inadequately or may not  be corrected upon review.

## 2020-04-21 ENCOUNTER — Other Ambulatory Visit: Payer: Self-pay | Admitting: "Endocrinology

## 2020-04-27 ENCOUNTER — Telehealth: Payer: Self-pay | Admitting: "Endocrinology

## 2020-04-27 ENCOUNTER — Other Ambulatory Visit: Payer: Self-pay | Admitting: "Endocrinology

## 2020-04-27 MED ORDER — LEVOTHYROXINE SODIUM 112 MCG PO TABS: 112.0000 ug | ORAL_TABLET | Freq: Every day | ORAL | 1 refills | Status: DC

## 2020-04-27 NOTE — Telephone Encounter (Signed)
Discussed with pt, understanding voiced. 

## 2020-04-27 NOTE — Telephone Encounter (Signed)
She can continue with 112 mcg of levothyroxine daily before breakfast. I will refill it for 90 days.

## 2020-04-27 NOTE — Telephone Encounter (Signed)
Patient is calling in regards to the mychart message she sent about levothyroxine (SYNTHROID) 112 MCG tablet  Not making a significant change in her facial flushing. Would like to know how to continue taking medication. Pt states if she needs to continue taking this she needs a 90 day supply of medication called in as she is out.   [phone]

## 2020-04-29 ENCOUNTER — Telehealth: Payer: Self-pay | Admitting: Neurology

## 2020-04-29 NOTE — Telephone Encounter (Signed)
..   Pt understands that although there may be some limitations with this type of visit, we will take all precautions to reduce any security or privacy concerns.  Pt understands that this will be treated like an in office visit and we will file with pt's insurance, and there may be a patient responsible charge related to this service. ? ?

## 2020-05-02 NOTE — Telephone Encounter (Signed)
Noted this is for the 05/23/2020 visit with Dr. Rexene Alberts.

## 2020-05-03 ENCOUNTER — Other Ambulatory Visit: Payer: Self-pay

## 2020-05-03 ENCOUNTER — Inpatient Hospital Stay (HOSPITAL_COMMUNITY): Payer: 59 | Attending: Hematology | Admitting: Hematology

## 2020-05-03 ENCOUNTER — Inpatient Hospital Stay (HOSPITAL_COMMUNITY): Payer: 59

## 2020-05-03 VITALS — BP 143/71 | HR 86 | Temp 97.1°F | Resp 18 | Wt 216.8 lb

## 2020-05-03 DIAGNOSIS — Z923 Personal history of irradiation: Secondary | ICD-10-CM | POA: Insufficient documentation

## 2020-05-03 DIAGNOSIS — Z85528 Personal history of other malignant neoplasm of kidney: Secondary | ICD-10-CM | POA: Diagnosis present

## 2020-05-03 DIAGNOSIS — D649 Anemia, unspecified: Secondary | ICD-10-CM | POA: Insufficient documentation

## 2020-05-03 DIAGNOSIS — Z905 Acquired absence of kidney: Secondary | ICD-10-CM | POA: Diagnosis not present

## 2020-05-03 DIAGNOSIS — Z85850 Personal history of malignant neoplasm of thyroid: Secondary | ICD-10-CM | POA: Diagnosis not present

## 2020-05-03 LAB — IRON AND TIBC
Iron: 44 ug/dL (ref 28–170)
Saturation Ratios: 11 % (ref 10.4–31.8)
TIBC: 395 ug/dL (ref 250–450)
UIBC: 351 ug/dL

## 2020-05-03 LAB — COMPREHENSIVE METABOLIC PANEL
ALT: 26 U/L (ref 0–44)
AST: 22 U/L (ref 15–41)
Albumin: 3.8 g/dL (ref 3.5–5.0)
Alkaline Phosphatase: 86 U/L (ref 38–126)
Anion gap: 8 (ref 5–15)
BUN: 11 mg/dL (ref 8–23)
CO2: 24 mmol/L (ref 22–32)
Calcium: 9.1 mg/dL (ref 8.9–10.3)
Chloride: 108 mmol/L (ref 98–111)
Creatinine, Ser: 0.73 mg/dL (ref 0.44–1.00)
GFR calc Af Amer: >60 mL/min (ref >60)
GFR calc non Af Amer: >60 mL/min (ref >60)
Glucose, Bld: 164 mg/dL — ABNORMAL HIGH (ref 70–99)
Potassium: 3.8 mmol/L (ref 3.5–5.1)
Sodium: 140 mmol/L (ref 135–145)
Total Bilirubin: 0.6 mg/dL (ref 0.3–1.2)
Total Protein: 6.8 g/dL (ref 6.5–8.1)

## 2020-05-03 LAB — CBC WITH DIFFERENTIAL/PLATELET
Abs Immature Granulocytes: 0.01 10*3/uL (ref 0.00–0.07)
Basophils Absolute: 0.1 10*3/uL (ref 0.0–0.1)
Basophils Relative: 1 %
Eosinophils Absolute: 0.2 10*3/uL (ref 0.0–0.5)
Eosinophils Relative: 4 %
HCT: 36.2 % (ref 36.0–46.0)
Hemoglobin: 11 g/dL — ABNORMAL LOW (ref 12.0–15.0)
Immature Granulocytes: 0 %
Lymphocytes Relative: 31 %
Lymphs Abs: 1.8 10*3/uL (ref 0.7–4.0)
MCH: 28.1 pg (ref 26.0–34.0)
MCHC: 30.4 g/dL (ref 30.0–36.0)
MCV: 92.3 fL (ref 80.0–100.0)
Monocytes Absolute: 0.5 10*3/uL (ref 0.1–1.0)
Monocytes Relative: 9 %
Neutro Abs: 3.2 10*3/uL (ref 1.7–7.7)
Neutrophils Relative %: 55 %
Platelets: 132 10*3/uL — ABNORMAL LOW (ref 150–400)
RBC: 3.92 MIL/uL (ref 3.87–5.11)
RDW: 16.3 % — ABNORMAL HIGH (ref 11.5–15.5)
WBC: 5.7 10*3/uL (ref 4.0–10.5)
nRBC: 0 % (ref 0.0–0.2)

## 2020-05-03 LAB — FERRITIN: Ferritin: 44 ng/mL (ref 11–307)

## 2020-05-03 LAB — LACTATE DEHYDROGENASE: LDH: 186 U/L (ref 98–192)

## 2020-05-03 LAB — TSH: TSH: 0.252 u[IU]/mL — ABNORMAL LOW (ref 0.350–4.500)

## 2020-05-03 LAB — VITAMIN B12: Vitamin B-12: 396 pg/mL (ref 180–914)

## 2020-05-03 NOTE — Progress Notes (Signed)
[place], [place] [address]   CLINIC:  Medical Oncology/Hematology  PCP:  Henderson Baltimore, MD None None   REASON FOR VISIT:  Follow-up for renal cell carcinoma  PRIOR THERAPY: Right partial nephrectomy on 05/17/2017  NGS Results: Not done  CURRENT THERAPY: Observation  BRIEF ONCOLOGIC HISTORY:  Oncology History  Malignant neoplasm of thyroid gland ([place])  05/27/2018 Initial Diagnosis   Malignant neoplasm of thyroid gland ([place])   12/02/2018 Genetic Testing   ATM (Gain (Exons 62-63) copy number = 3 VUS identified on the multicancer panel.  The Multi-Gene Panel offered by [facility] includes sequencing and/or deletion duplication testing of the following 85 genes: AIP, ALK, APC, ATM, AXIN2,BAP1,  BARD1, BLM, BMPR1A, BRCA1, BRCA2, BRIP1, CASR, CDC73, CDH1, CDK4, CDKN1B, CDKN1C, CDKN2A (p14ARF), CDKN2A (p16INK4a), CEBPA, CHEK2, CTNNA1, DICER1, DIS3L2, EGFR (c.2369C>T, p.Thr790Met variant only), EPCAM (Deletion/duplication testing only), FH, FLCN, GATA2, GPC3, GREM1 (Promoter region deletion/duplication testing only), HOXB13 (c.251G>A, p.Gly84Glu), HRAS, KIT, MAX, MEN1, MET, MITF (c.952G>A, p.Glu318Lys variant only), MLH1, MSH2, MSH3, MSH6, MUTYH, NBN, NF1, NF2, NTHL1, PALB2, PDGFRA, PHOX2B, PMS2, POLD1, POLE, POT1, PRKAR1A, PTCH1, PTEN, RAD50, RAD51C, RAD51D, RB1, RECQL4, RET, RNF43, RUNX1, SDHAF2, SDHA (sequence changes only), SDHB, SDHC, SDHD, SMAD4, SMARCA4, SMARCB1, SMARCE1, STK11, SUFU, TERC, TERT, TMEM127, TP53, TSC1, TSC2, VHL, WRN and WT1.  The report date is December 02, 2018.     CANCER STAGING: Cancer Staging No matching staging information was found for the patient.  INTERVAL HISTORY:  Ms. MARILIN KOFMAN, a 64 y.o. female, returns for routine follow-up of her renal cell carcinoma. Millisa was last contacted via telephone on 11/04/2019.   Today she reports that she has moved to [facility]. She reports having no new issues since the last  visit and denies any hematochezia or hematuria. She is currently taking levothyroxine 112 mcg. She has never taken iron tablets before.   REVIEW OF SYSTEMS:  Review of Systems  Constitutional: Negative for appetite change and fatigue.  HENT:   Positive for trouble swallowing (solids).   Respiratory: Positive for cough.   Gastrointestinal: Positive for constipation and diarrhea.  Psychiatric/Behavioral: Positive for sleep disturbance.  All other systems reviewed and are negative.   PAST MEDICAL/SURGICAL HISTORY:  Past Medical History:  Diagnosis Date  . Anemia   . Anxiety   . Arthritis   . Asthma    seasonal asthma rarely   . Chronic kidney disease    renal mass right kidney   . CPAP (continuous positive airway pressure) dependence   . Diverticulitis   . Family history of cancer of extrahepatic bile ducts   . Family history of colon cancer   . Family history of kidney cancer   . Family history of melanoma   . GERD (gastroesophageal reflux disease)   . Hemorrhoids   . History of kidney stones   . HTN (hypertension) 07/29/2014  . Hyperlipemia   . Hypertension   . Migraine    no migraines now, will have stress headaches  . Multinodular goiter 08/21/2017   Last Assessment & Plan:  She will need an f/u u/s of thyroid to reassess nodules. Repeat in 07/2018 Nodules are too small for biopsy now.  . 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 Papillary thyroid carcinoma ([place]) 03/10/2018  . PONV (postoperative nausea and vomiting)    patient reports being "slower to wake than average "   . Pre-diabetes    "ive been  told i'm borderline"   . Sleep apnea    CPAP use    Past Surgical History:  Procedure Laterality Date  . ABDOMINAL HYSTERECTOMY    . APPENDECTOMY  1978  . BIOPSY  07/08/2017   Procedure: BIOPSY;  Surgeon: Rogene Houston, MD;  [facility];  Service: Endoscopy;;  gastric  . CHOLECYSTECTOMY  2005  . COLONOSCOPY WITH  PROPOFOL N/A 10/28/2017   Procedure: COLONOSCOPY WITH PROPOFOL;  Surgeon: Rogene Houston, MD;  [facility];  Service: Endoscopy;  Laterality: N/A;  7:30  . ESOPHAGOGASTRODUODENOSCOPY N/A 07/08/2017   Procedure: ESOPHAGOGASTRODUODENOSCOPY (EGD);  Surgeon: Rogene Houston, MD;  [facility];  Service: Endoscopy;  Laterality: N/A;  220  . LUMBAR LAMINECTOMY/DECOMPRESSION MICRODISCECTOMY N/A 09/26/2018   Procedure: L4-5 MICRODISCECTOMY;  Surgeon: [name], MD;  Location: [facility];  Service: Orthopedics;  Laterality: N/A;  . NASAL SEPTUM SURGERY    . ROBOTIC ASSITED PARTIAL NEPHRECTOMY Right 05/17/2017   Procedure: XI ROBOTIC ASSITED PARTIAL NEPHRECTOMY;  Surgeon: Alexis Frock, MD;  Location: [facility];  Service: Urology;  Laterality: Right;  . Ruptured Disk L5    . THYROIDECTOMY N/A 03/10/2018   Procedure: TOTAL THYROIDECTOMY;  Surgeon: Aviva Signs, MD;  Location: [facility];  Service: General;  Laterality: N/A;    SOCIAL HISTORY:  Social History   Socioeconomic History  . Marital status: Widowed    Spouse name: Not on file  . Number of children: Not on file  . Years of education: Not on file  . Highest education level: Not on file  Occupational History  . Not on file  Tobacco Use  . Smoking status: Never Smoker  . Smokeless tobacco: Never Used  Vaping Use  . Vaping Use: Never used  Substance and Sexual Activity  . Alcohol use: Yes    Comment: rarely   . Drug use: No  . Sexual activity: Not Currently  Other Topics Concern  . Not on file  Social History Narrative  . Not on file   Social Determinants of Health   Financial Resource Strain:   . Difficulty of Paying Living Expenses: Not on file  Food Insecurity:   . Worried About Charity fundraiser in the Last Year: Not on file  . Ran Out of Food in the Last Year: Not on file  Transportation Needs:   . Lack of Transportation (Medical): Not on file  . Lack of Transportation (Non-Medical): Not on file    Physical Activity:   . Days of Exercise per Week: Not on file  . Minutes of Exercise per Session: Not on file  Stress:   . Feeling of Stress : Not on file  Social Connections:   . Frequency of Communication with Friends and Family: Not on file  . Frequency of Social Gatherings with Friends and Family: Not on file  . Attends Religious Services: Not on file  . Active Member of Clubs or Organizations: Not on file  . Attends Archivist Meetings: Not on file  . Marital Status: Not on file  Intimate Partner Violence:   . Fear of Current or Ex-Partner: Not on file  . Emotionally Abused: Not on file  . Physically Abused: Not on file  . Sexually Abused: Not on file    FAMILY HISTORY:  Family History  Problem Relation Age of Onset  . COPD Mother   . Diabetes Mother   . Hypertension Mother   . High Cholesterol Mother   .  Liver cancer Mother        bile duct cancer  . COPD Sister   . Hypertension Brother   . High Cholesterol Brother   . Kidney cancer Brother 105  . Hypertension Brother   . Asthma Son   . Congenital heart disease Son        hypoplastic left heart syndrome  . Melanoma Maternal Aunt        dx in her 110s  . Brain cancer Maternal Grandmother        ? benign  . Heart disease Maternal Grandfather   . Colon cancer Other   . Lung disease Maternal Uncle   . Cancer Cousin        ? lunc cancer; mother's mat first cousin    CURRENT MEDICATIONS:  Current Outpatient Medications  Medication Sig Dispense Refill  . famotidine (PEPCID) 20 MG tablet Take 20 mg by mouth at bedtime.     . hydrochlorothiazide (HYDRODIURIL) 25 MG tablet TAKE 1/2 TABLET BY MOUTH DAILY 45 tablet 0  . levothyroxine (SYNTHROID) 112 MCG tablet Take 1 tablet (112 mcg total) by mouth daily. 90 tablet 1  . lidocaine (LIDODERM) 5 % Place onto the skin.    Marland Kitchen losartan (COZAAR) 100 MG tablet TAKE 1 TABLET ONCE DAILY. 90 tablet 1  . methocarbamol (ROBAXIN) 500 MG tablet Take 1 tablet (500 mg total)  by mouth every 8 (eight) hours as needed for muscle spasms. 20 tablet 0  . metroNIDAZOLE (METROGEL) 0.75 % gel Apply topically.    . nortriptyline (PAMELOR) 10 MG capsule TAKE 1-4 TABLETS NIGHTLY AS INSTRUCTED    . oxybutynin (DITROPAN-XL) 5 MG 24 hr tablet Take by mouth.    . potassium chloride SA (K-DUR,KLOR-CON) 20 MEQ tablet TAKE 1 TABLET TWICE DAILY FOR 3 DAYS THEN 1 TABLET DAILY. 30 tablet 5  . pregabalin (LYRICA) 50 MG capsule Take 50 mg by mouth 2 (two) times daily.    . simvastatin (ZOCOR) 40 MG tablet Take 40 mg by mouth daily at 6 PM.     . clonazePAM (KLONOPIN) 0.5 MG tablet Take 0.5 mg by mouth 2 (two) times daily as needed for anxiety.     . diclofenac Sodium (VOLTAREN) 1 % GEL Apply 2 g topically as needed. As needed. (Patient not taking: Reported on 05/03/2020)    . diphenhydrAMINE (BENADRYL ALLERGY) 25 MG tablet Take 1 tablet (25 mg total) by mouth at bedtime. 2 days before and 3 days after the contrast. 5 tablet 0  . HYDROcodone-acetaminophen (NORCO/VICODIN) 5-325 MG tablet Take 1 tablet by mouth every 6 (six) hours as needed for moderate pain. (Patient not taking: Reported on 11/04/2019) 20 tablet 0  . predniSONE (DELTASONE) 10 MG tablet Take 1 tablet (10 mg total) by mouth daily with breakfast. 3 days before  - 2 days after the contrast. 5 tablet 0   No current facility-administered medications for this visit.    ALLERGIES:  Allergies  Allergen Reactions  . Iodinated Diagnostic Agents Hives and Itching  . Mango Flavor Swelling and Rash    Tongue swelling, feels like throat is closing in on her   . Isovue [Iopamidol] Hives and Itching    And hives   . Nitrofurantoin Hives  . Other Hives, Itching, Rash and Other (See Comments)    Use Paper tape ONLY  . Sulfa Antibiotics Hives  . Adhesive [Tape] Rash and Other (See Comments)    Use Paper tape ONLY    PHYSICAL EXAM:  Performance  status (ECOG): 1 - Symptomatic but completely ambulatory  Vitals:   05/03/20 1439  BP:  (!) 143/71  Pulse: 86  Resp: 18  Temp: (!) 97.1 F (36.2 C)  SpO2: 99%   Wt Readings from Last 3 Encounters:  05/03/20 216 lb 12.8 oz (98.3 kg)  03/29/20 214 lb 12.8 oz (97.4 kg)  02/19/20 214 lb 9.6 oz (97.3 kg)   Physical Exam Vitals reviewed.  Constitutional:      Appearance: Normal appearance. She is obese.  Cardiovascular:     Rate and Rhythm: Normal rate and regular rhythm.     Pulses: Normal pulses.     Heart sounds: Normal heart sounds.  Pulmonary:     Effort: Pulmonary effort is normal.     Breath sounds: Normal breath sounds.  Abdominal:     Palpations: Abdomen is soft. There is no hepatomegaly, splenomegaly or mass.     Tenderness: There is no abdominal tenderness.     Hernia: No hernia is present.  Lymphadenopathy:     Cervical: No cervical adenopathy.     Upper Body:     Right upper body: No supraclavicular adenopathy.     Left upper body: No supraclavicular adenopathy.  Neurological:     General: No focal deficit present.     Mental Status: She is alert and oriented to person, place, and time.  Psychiatric:        Mood and Affect: Mood normal.        Behavior: Behavior normal.      LABORATORY DATA:  I have reviewed the labs as listed.  CBC Latest Ref Rng & Units 05/03/2020 10/26/2019 04/27/2019  WBC 4.0 - 10.5 K/uL 5.7 5.3 5.4  Hemoglobin 12.0 - 15.0 g/dL 11.0(L) 11.8(L) 10.7(L)  Hematocrit 36 - 46 % 36.2 38.0 34.5(L)  Platelets 150 - 400 K/uL 132(L) 166 177   CMP Latest Ref Rng & Units 05/03/2020 03/25/2020 10/26/2019  Glucose 70 - 99 mg/dL 164(H) - 166(H)  BUN 8 - 23 mg/dL 11 - 12  Creatinine 0.44 - 1.00 mg/dL 0.73 0.70 0.76  Sodium 135 - 145 mmol/L 140 - 141  Potassium 3.5 - 5.1 mmol/L 3.8 - 3.7  Chloride 98 - 111 mmol/L 108 - 103  CO2 22 - 32 mmol/L 24 - 25  Calcium 8.9 - 10.3 mg/dL 9.1 - 10.0  Total Protein 6.5 - 8.1 g/dL 6.8 - 8.0  Total Bilirubin 0.3 - 1.2 mg/dL 0.6 - 0.5  Alkaline Phos 38 - 126 U/L 86 - 108  AST 15 - 41 U/L 22 - 24  ALT 0  - 44 U/L 26 - 27   Lab Results  Component Value Date   LDH 186 05/03/2020   LDH 175 10/26/2019   LDH 213 (H) 04/27/2019   Lab Results  Component Value Date   TIBC 395 05/03/2020   TIBC 409 10/26/2019   FERRITIN 44 05/03/2020   FERRITIN 84 10/26/2019   IRONPCTSAT 11 05/03/2020   IRONPCTSAT 16 10/26/2019    DIAGNOSTIC IMAGING:  I have independently reviewed the scans and discussed with the patient. No results found.   ASSESSMENT:  1.  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 -CTAP from 10/26/2019 with No findings to suggest recurrent or metastatic disease in the abdomen or pelvis.  Colonic diverticulosis without evidence of acute diverticulitis.  Bilateral renal cysts which are simple cysts and stable. -She  has moved to [facility].  2.  Thyroid cancer: -She underwent thyroidectomy and radioactive iodine treatment. -Synthroid was increased to 125 mcg on 07/30/2019. -She follows up with Dr. Dorris Fetch.  I-131 scan on 02/01/2020 showed small single focus of increased uptake within the left lower lobe neck -CT neck soft tissue on 03/25/2020 did not show any mass or adenopathy.  No definite finding corresponding to area of increased uptake on the nuclear study.  3.  Normocytic anemia: -CBC from 10/26/2019 shows hemoglobin 11.8, hematocrit 38, normal white count and platelet count.  MCV is 10.4. -U45 and folic acid were normal.  Ferritin was 84 and percent saturation was 16.  4.  Family history: -Because of her extensive family history, she met with our genetic counselor. -She did not have any known mutations.   PLAN:  1.  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  No sign of metastatic disease clinically. -Reviewed labs which showed normal LFTs.  Creatinine also normal at 0.7 calcium 9.1.  LDH was normal. -Recommended  follow-up in 6 months.  I plan to repeat CTAP prior to next visit.  2.  Thyroid cancer: -TSH is 0.2.  Continue follow-up with endocrinology.  3.  Normocytic anemia: -CBC shows hemoglobin 11.  Ferritin is 44.  B12 was normal.  No bleeding reported.  We will plan repeating labs in 6 months.     Orders placed this encounter:  No orders of the defined types were placed in this encounter.    Derek Jack, MD [place] [phone]   I, Milinda Antis, am acting as a scribe for Dr. Sanda Linger.  I, Derek Jack MD, have reviewed the above documentation for accuracy and completeness, and I agree with the above.

## 2020-05-03 NOTE — Patient Instructions (Signed)
[place] at [facility] Discharge Instructions  You were seen today by Dr. Delton Coombes. He went over your recent results. Purchase iron tablets over the counter and taken 1 tablet every other day; purchase stool softeners and take daily for constipation. Dr. Delton Coombes will see you back in 6 months for labs and follow up.   Thank you for choosing [place] at [facility] to provide your oncology and hematology care.  To afford each patient quality time with our provider, please arrive at least 15 minutes before your scheduled appointment time.   If you have a lab appointment with the [facility] please come in thru the Main Entrance and check in at the main information desk  You need to re-schedule your appointment should you arrive 10 or more minutes late.  We strive to give you quality time with our providers, and arriving late affects you and other patients whose appointments are after yours.  Also, if you no show three or more times for appointments you may be dismissed from the clinic at the providers discretion.     Again, thank you for choosing [facility].  Our hope is that these requests will decrease the amount of time that you wait before being seen by our physicians.       _____________________________________________________________  Should you have questions after your visit to [facility], please contact our office at [phone] between the hours of 8:00 a.m. and 4:30 p.m.  Voicemails left after 4:00 p.m. will not be returned until the following business day.  For prescription refill requests, have your pharmacy contact our office and allow 72 hours.    Cancer Center Support Programs:   > Cancer Support Group  2nd Tuesday of the month 1pm-2pm, Journey Room

## 2020-05-23 ENCOUNTER — Telehealth: Payer: 59 | Admitting: Neurology

## 2020-05-24 ENCOUNTER — Telehealth: Payer: Self-pay

## 2020-05-24 NOTE — Telephone Encounter (Signed)
Pt did not login for her 05/23/2020 my chart appointment.

## 2020-06-21 ENCOUNTER — Other Ambulatory Visit: Payer: Self-pay | Admitting: Cardiology

## 2020-06-26 ENCOUNTER — Encounter: Payer: Self-pay | Admitting: Neurology

## 2020-06-28 ENCOUNTER — Encounter: Payer: Self-pay | Admitting: Neurology

## 2020-06-28 ENCOUNTER — Telehealth (INDEPENDENT_AMBULATORY_CARE_PROVIDER_SITE_OTHER): Payer: 59 | Admitting: Neurology

## 2020-06-28 DIAGNOSIS — Z9989 Dependence on other enabling machines and devices: Secondary | ICD-10-CM | POA: Diagnosis not present

## 2020-06-28 DIAGNOSIS — G4733 Obstructive sleep apnea (adult) (pediatric): Secondary | ICD-10-CM | POA: Diagnosis not present

## 2020-06-28 NOTE — Patient Instructions (Signed)
Given verbally, during today's virtual video-based encounter, with verbal feedback received.   

## 2020-06-28 NOTE — Progress Notes (Signed)
Interim history:  Rebecca Scott is a 64 year old right-handed woman with an underlying medical history of prediabetes, papillary thyroid cancer, migraine headaches, hypertension, hyperlipidemia, history of kidney stones, reflux disease, diverticulitis, asthma, arthritis, anemia, anxiety, and obesity, who presents for a virtual video visit through MyChart for follow-up consultation of her obstructive sleep apnea, her yearly checkup. She missed a VV on 05/23/20. The patient is unaccompanied today and joins via laptop from her home, I am located in my office.  I last saw her for an office visit on 05/20/2019, at which time she had established treatment with AutoPap after a home sleep test on 02/11/2019 reestablished her diagnosis of sleep apnea in the moderate range, AHI was 15.5/h, O2 nadir 91%.  She had been on CPAP therapy before that for years.  She was compliant with treatment and advised to follow-up routinely in 1 year.  She had recently moved to [facility] and we had discussed the possibility of doing a virtual visit.  Today, 06/28/2020: I reviewed her AutoPap compliance data from 05/28/2020 through 06/26/2020, which is a total of 30 days, during which time she used her machine 27 days with percent use days greater than 4 hours at 90%, indicating excellent compliance with an average usage of 8 hours and 19 minutes, residual AHI at goal at 0.6/h, 95th percentile of pressure at 10.4 cm with a range of 7 to 11 cm, leak on the lower end of the spectrum with the 95th percentile at 6.2 L/min.  She does have a backup machine, her old CPAP when she is out of town and she was visiting a friend over the weekend in late September which explains the 3 days of no usage on this machine.  She has done well with her AutoPap and is fully compliant, continues to benefit from it but has had some difficulty or delay in getting supplies.  She is interested in moving her DME company to more closer to home.  Her previous DME was  [employer].  She had received a new mask in the house in June or July of this year.  She has not been changing the filter on a regular basis.  She will email [place] back with her requested new DME company and I will place a prescription for new supplies after that.  She has settled in well and enjoys her new home in the [place] area.  She is closer to her kids and grandchildren.  She has made some new friends through a prayer group.  She is in the process of transferring care of her oncology follow-up with a new cancer doctor in [facility] and she has transferred her primary care to Dr. Melina Modena, through [facility].    The patient's allergies, current medications, family history, past medical history, past social history, past surgical history and problem list were reviewed and updated as appropriate.    Previously:      I first met her on 01/06/2019 and a virtual visit at the request of her primary care physician, at which time she reported a prior diagnosis of obstructive sleep apnea.  She was on CPAP of 8 cm via nasal pillows at the time.  She was on her second machine at the time and it was about 63 years old.  She was advised to proceed with reevaluation in the form of home sleep testing and was advised that she was likely eligible for a new machine.  She had a home sleep test on 02/11/2019 which  indicated moderate obstructive sleep apnea by a number of events with an AHI of 15.5/h, O2 nadir of 91%.  She was advised to proceed with AutoPap therapy in the midst of the COVID-19 pandemic.   I reviewed her AutoPap compliance data from 04/19/2019 through 05/18/2019 which is a total of 30 days, during which time she used her machine 24 days with percent use days greater than 4 hours at 80%, indicating very good compliance with an average usage of 9 hours and 58 minutes, residual AHI at goal at 0.3/h, leak on the low side with a 95th percentile at 4.2 L/min, pressure range of 7 cm to 11 cm, 95th percentile of pressure at  10.6 cm, EPR of 1.   01/06/2019: I am conducting a virtual, video based new patient visit via Webex in lieu of a face-to-face visit for evaluation of her sleep disorder, in particular, reevaluation of her prior diagnosis of obstructive sleep apnea. The patient is unaccompanied today and joins via cell phone from home. She is referred by Dr. Ronnie Doss and I reviewed her phone visit note from 12/22/18. She reports difficulty maintaining sleep, nonrestorative sleep, some residual sleep apnea type symptoms. She reports compliance with her CPAP. Her current machine is set to a pressure of 8 cm, she uses nasal pillows, DME company is [employer] in [place]. She received a new machine in 2015, her model is ResMed escape auto.  She was diagnosed with obstructive sleep apnea nearly 15 years ago, in 2006 and she has been on CPAP therapy. Her machine is about 63 years old, she is on her second machine. Prior sleep study results were reviewed from 05/21/2005. She had a split-night sleep study at the time. Her baseline RDI was 32 per hour, O2 nadir 82%. She had good control with a CPAP of 8 cm. A CPAP compliance download is not possible today. Her Epworth sleepiness score is 11 out of 24, fatigue severity score is 51 out of 63. Her bedtime is between 10 and 11 and rise time between 8 and 10. She does not have night to night nocturia. She gained weight after her cancer surgeries. She had a partial right nephrectomy and a thyroidectomy. She was diagnosed with asthma some years ago and was also told that she had reduced tonsils as in a naturally shrunk away. She did not actually have a tonsillectomy. She is not aware of any family history of OSA. She lives alone, is widowed, has adult children. She has no pets in the household. She drinks caffeine limitation, 1 cup of coffee per day on average, she is a nonsmoker does not currently utilize any alcohol. She has a TV in the bedroom but does not typically watch it at night.  She reports full compliance with her CPAP but wonders if the settings need to be changed. She is on disability, previously worked as an Product/process development scientist.   Her Past Medical History Is Significant For: Past Medical History:  Diagnosis Date  . Anemia   . Anxiety   . Arthritis   . Asthma    seasonal asthma rarely   . Chronic kidney disease    renal mass right kidney   . CPAP (continuous positive airway pressure) dependence   . Diverticulitis   . Family history of cancer of extrahepatic bile ducts   . Family history of colon cancer   . Family history of kidney cancer   . Family history of melanoma   . GERD (  gastroesophageal reflux disease)   . Hemorrhoids   . History of kidney stones   . HTN (hypertension) 07/29/2014  . Hyperlipemia   . Hypertension   . Migraine    no migraines now, will have stress headaches  . Multinodular goiter 08/21/2017   Last Assessment & Plan:  She will need an f/u u/s of thyroid to reassess nodules. Repeat in 07/2018 Nodules are too small for biopsy now.  . Papillary renal cell carcinoma ([place]) 05/2017   s/p partial nephrectomy, right  . Papillary thyroid carcinoma ([place]) 03/10/2018  . PONV (postoperative nausea and vomiting)    patient reports being "slower to wake than average "   . Pre-diabetes    "ive been told i'm borderline"   . Sleep apnea    CPAP use     Her Past Surgical History Is Significant For: Past Surgical History:  Procedure Laterality Date  . ABDOMINAL HYSTERECTOMY    . APPENDECTOMY  1978  . BIOPSY  07/08/2017   Procedure: BIOPSY;  Surgeon: Rogene Houston, MD;  Location: [facility];  Service: Endoscopy;;  gastric  . CHOLECYSTECTOMY  2005  . COLONOSCOPY WITH PROPOFOL N/A 10/28/2017   Procedure: COLONOSCOPY WITH PROPOFOL;  Surgeon: Rogene Houston, MD;  [facility];  Service: Endoscopy;  Laterality: N/A;  7:30  . ESOPHAGOGASTRODUODENOSCOPY N/A 07/08/2017   Procedure: ESOPHAGOGASTRODUODENOSCOPY  (EGD);  Surgeon: Rogene Houston, MD;  [facility];  Service: Endoscopy;  Laterality: N/A;  220  . LUMBAR LAMINECTOMY/DECOMPRESSION MICRODISCECTOMY N/A 09/26/2018   Procedure: L4-5 MICRODISCECTOMY;  Surgeon: [name], MD;  Location: [place];  Service: Orthopedics;  Laterality: N/A;  . NASAL SEPTUM SURGERY    . ROBOTIC ASSITED PARTIAL NEPHRECTOMY Right 05/17/2017   Procedure: XI ROBOTIC ASSITED PARTIAL NEPHRECTOMY;  Surgeon: Alexis Frock, MD;  Location: [facility];  Service: Urology;  Laterality: Right;  . Ruptured Disk L5    . THYROIDECTOMY N/A 03/10/2018   Procedure: TOTAL THYROIDECTOMY;  Surgeon: Aviva Signs, MD;  Location: [facility];  Service: General;  Laterality: N/A;    Her Family History Is Significant For: Family History  Problem Relation Age of Onset  . COPD Mother   . Diabetes Mother   . Hypertension Mother   . High Cholesterol Mother   . Liver cancer Mother        bile duct cancer  . COPD Sister   . Hypertension Brother   . High Cholesterol Brother   . Kidney cancer Brother 84  . Hypertension Brother   . Asthma Son   . Congenital heart disease Son        hypoplastic left heart syndrome  . Melanoma Maternal Aunt        dx in her 45s  . Brain cancer Maternal Grandmother        ? benign  . Heart disease Maternal Grandfather   . Colon cancer Other   . Lung disease Maternal Uncle   . Cancer Cousin        ? lunc cancer; mother's mat first cousin    Her Social History Is Significant For: Social History   Socioeconomic History  . Marital status: Widowed    Spouse name: Not on file  . Number of children: Not on file  . Years of education: Not on file  . Highest education level: Not on file  Occupational History  . Not on file  Tobacco Use  . Smoking status: Never Smoker  . Smokeless tobacco: Never  Used  Vaping Use  . Vaping Use: Never used  Substance and Sexual Activity  . Alcohol use: Yes    Comment: rarely   . Drug use: No  . Sexual activity:  Not Currently  Other Topics Concern  . Not on file  Social History Narrative  . Not on file   Social Determinants of Health   Financial Resource Strain:   . Difficulty of Paying Living Expenses: Not on file  Food Insecurity:   . Worried About Charity fundraiser in the Last Year: Not on file  . Ran Out of Food in the Last Year: Not on file  Transportation Needs:   . Lack of Transportation (Medical): Not on file  . Lack of Transportation (Non-Medical): Not on file  Physical Activity:   . Days of Exercise per Week: Not on file  . Minutes of Exercise per Session: Not on file  Stress:   . Feeling of Stress : Not on file  Social Connections:   . Frequency of Communication with Friends and Family: Not on file  . Frequency of Social Gatherings with Friends and Family: Not on file  . Attends Religious Services: Not on file  . Active Member of Clubs or Organizations: Not on file  . Attends Archivist Meetings: Not on file  . Marital Status: Not on file    Her Allergies Are:  Allergies  Allergen Reactions  . Iodinated Diagnostic Agents Hives and Itching  . Mango Flavor Swelling and Rash    Tongue swelling, feels like throat is closing in on her   . Isovue [Iopamidol] Hives and Itching    And hives   . Nitrofurantoin Hives  . Other Hives, Itching, Rash and Other (See Comments)    Use Paper tape ONLY  . Sulfa Antibiotics Hives  . Adhesive [Tape] Rash and Other (See Comments)    Use Paper tape ONLY  :   Her Current Medications Are:  Outpatient Encounter Medications as of 06/28/2020  Medication Sig  . clonazePAM (KLONOPIN) 0.5 MG tablet Take 0.5 mg by mouth 2 (two) times daily as needed for anxiety.   . diclofenac Sodium (VOLTAREN) 1 % GEL Apply 2 g topically as needed. As needed. (Patient not taking: Reported on 05/03/2020)  . diphenhydrAMINE (BENADRYL ALLERGY) 25 MG tablet Take 1 tablet (25 mg total) by mouth at bedtime. 2 days before and 3 days after the contrast.  .  famotidine (PEPCID) 20 MG tablet Take 20 mg by mouth at bedtime.   . hydrochlorothiazide (HYDRODIURIL) 25 MG tablet TAKE 1/2 TABLET BY MOUTH DAILY  . HYDROcodone-acetaminophen (NORCO/VICODIN) 5-325 MG tablet Take 1 tablet by mouth every 6 (six) hours as needed for moderate pain. (Patient not taking: Reported on 11/04/2019)  . levothyroxine (SYNTHROID) 112 MCG tablet Take 1 tablet (112 mcg total) by mouth daily.  Marland Kitchen losartan (COZAAR) 100 MG tablet TAKE 1 TABLET ONCE DAILY.  . methocarbamol (ROBAXIN) 500 MG tablet Take 1 tablet (500 mg total) by mouth every 8 (eight) hours as needed for muscle spasms.  . metroNIDAZOLE (METROGEL) 0.75 % gel Apply topically.  . nortriptyline (PAMELOR) 10 MG capsule TAKE 1-4 TABLETS NIGHTLY AS INSTRUCTED  . oxybutynin (DITROPAN-XL) 5 MG 24 hr tablet Take by mouth.  . potassium chloride SA (K-DUR,KLOR-CON) 20 MEQ tablet TAKE 1 TABLET TWICE DAILY FOR 3 DAYS THEN 1 TABLET DAILY.  Marland Kitchen predniSONE (DELTASONE) 10 MG tablet Take 1 tablet (10 mg total) by mouth daily with breakfast. 3 days before  -  2 days after the contrast.  . pregabalin (LYRICA) 50 MG capsule Take 50 mg by mouth 2 (two) times daily.  . simvastatin (ZOCOR) 40 MG tablet Take 40 mg by mouth daily at 6 PM.    No facility-administered encounter medications on file as of 06/28/2020.  :  Review of Systems:  Out of a complete 14 point review of systems, all are reviewed and negative with the exception of these symptoms as listed below:  Virtual Visit via Video Note on 06/28/2020:   I connected with [place] on 06/28/20 at  9:30 AM EDT by a video enabled telemedicine application and verified that I am speaking with the correct person using two identifiers.   I discussed the limitations of evaluation and management by telemedicine and the availability of in person appointments. The patient expressed understanding and agreed to proceed.  History of Present Illness: See above.   Observations/Objective: She is  pleasant, conversant, in no acute distress, no dysarthria, no hypophonia or voice tremor noted, face is symmetric with normal facial animation, hearing grossly intact.  No labored breathing.  Upper body movements are normal.    Assessment and Plan: Ms. Temitope Flammer is a 64 year old right-handed woman with an underlying medical history of prediabetes, papillary thyroid cancer, migraine headaches, hypertension, hyperlipidemia, history of kidney stones, reflux disease, diverticulitis, asthma, arthritis, anemia, anxiety, and obesity,who presents for follow-up consultation of her obstructive sleep apnea.  She has been on a CPAP machine for many years, she had an older machine and needed reevaluation. Her home sleep test from 02/11/2019 showed an AHI of 15.5/h, O2 nadir of 91%.  She has been on autoPap and is compliant with treatment. Lapses on the AutoPap compliance are secondary to travel typically, and she uses her old CPAP machine as a back up. She has moved to [facility], and has settled in but she would benefit from establishing with a local DME company.  She has had some delay in getting supplies.  She is advised to exchange her filter and her mask on a regular basis, hose typically on a 6 monthly basis and at least once a year she should be able to get a new humidifier chamber.  She is advised to let us know when she has found a local DME company she would like to go with and I will send a prescription for supplies to them.  She can follow-up with me in 1 year or establish care with a sleep doctor locally hopefully at some point.  Nevertheless, if need be, we can consider a virtual visit for follow-up if it is covered by her insurance at the time.  She is commended on her treatment adherence and encouraged to be fully compliant with her AutoPap and she can continue to use the CPAP for backup if needed.  I answered all her questions today and she was in agreement.   Follow Up Instructions:  FU 1 year or prn.    I discussed the assessment and treatment plan with the patient. The patient was provided an opportunity to ask questions and all were answered. The patient agreed with the plan and demonstrated an understanding of the instructions.   The patient was advised to call back or seek an in-person evaluation if the symptoms worsen or if the condition fails to improve as anticipated.  I provided 23 minutes of non-face-to-face time during this encounter.   Star Age, MD

## 2020-07-14 ENCOUNTER — Encounter: Payer: Self-pay | Admitting: Neurology

## 2020-07-14 ENCOUNTER — Other Ambulatory Visit: Payer: Self-pay | Admitting: Neurology

## 2020-07-14 DIAGNOSIS — G4733 Obstructive sleep apnea (adult) (pediatric): Secondary | ICD-10-CM

## 2020-07-21 ENCOUNTER — Telehealth: Payer: Self-pay | Admitting: Neurology

## 2020-07-21 NOTE — Telephone Encounter (Signed)
[facility] [phone] option 4  Has called to inform they sent a prescription requesting a signature for CPAP supplies, pt is out.  They are calling to inform so pt can have for when she goes out of town for [employer]

## 2020-07-21 NOTE — Telephone Encounter (Signed)
Called back. Advised we have not received fax for MD to sign orders. Asked they re-fax it to [phone]. They will do this.

## 2020-07-21 NOTE — Telephone Encounter (Signed)
Received orders via fax for MD to sign. Pending MD signature.

## 2020-08-10 ENCOUNTER — Ambulatory Visit (HOSPITAL_COMMUNITY): Payer: 59

## 2020-09-10 HISTORY — PX: SUBMANDIBULAR GLAND EXCISION: SHX2456

## 2020-09-14 ENCOUNTER — Ambulatory Visit: Payer: Medicare Other

## 2020-09-15 ENCOUNTER — Ambulatory Visit
Admission: RE | Admit: 2020-09-15 | Discharge: 2020-09-15 | Disposition: A | Discharge status: Home / Self Care | Payer: Medicare Other | Source: Ambulatory Visit | Attending: "Endocrinology | Admitting: "Endocrinology

## 2020-09-15 ENCOUNTER — Other Ambulatory Visit: Payer: Self-pay

## 2020-09-15 DIAGNOSIS — C73 Malignant neoplasm of thyroid gland: Secondary | ICD-10-CM | POA: Insufficient documentation

## 2020-09-23 ENCOUNTER — Other Ambulatory Visit: Payer: Self-pay

## 2020-09-23 DIAGNOSIS — E890 Postprocedural hypothyroidism: Secondary | ICD-10-CM

## 2020-09-26 ENCOUNTER — Other Ambulatory Visit: Payer: Self-pay

## 2020-09-26 DIAGNOSIS — E890 Postprocedural hypothyroidism: Secondary | ICD-10-CM

## 2020-09-26 DIAGNOSIS — C73 Malignant neoplasm of thyroid gland: Secondary | ICD-10-CM

## 2020-09-28 LAB — THYROGLOBULIN ANTIBODY: Thyroglobulin Ab: <1 IU/mL (ref <=1)

## 2020-09-28 LAB — T4, FREE: Free T4: 1.2 ng/dL (ref 0.8–1.8)

## 2020-09-28 LAB — THYROGLOBULIN LEVEL: Thyroglobulin: <0.1 ng/mL — ABNORMAL LOW

## 2020-09-28 LAB — TSH: TSH: 0.29 mIU/L — ABNORMAL LOW (ref 0.40–4.50)

## 2020-09-29 ENCOUNTER — Other Ambulatory Visit: Payer: Self-pay

## 2020-09-29 ENCOUNTER — Encounter: Payer: Self-pay | Admitting: "Endocrinology

## 2020-09-29 ENCOUNTER — Ambulatory Visit: Payer: Medicare Other | Admitting: "Endocrinology

## 2020-09-29 VITALS — BP 126/84 | HR 72 | Ht 63.0 in | Wt 209.0 lb

## 2020-09-29 DIAGNOSIS — C73 Malignant neoplasm of thyroid gland: Secondary | ICD-10-CM

## 2020-09-29 DIAGNOSIS — E890 Postprocedural hypothyroidism: Secondary | ICD-10-CM | POA: Diagnosis not present

## 2020-09-29 NOTE — Progress Notes (Signed)
Endocrinology follow-up note                                            09/29/2020, 3:00 PM   Subjective:    Patient ID: Rebecca Scott, female    DOB: [birth year], PCP Henderson Baltimore, MD   Past Medical History:  Diagnosis Date  . Anemia   . Anxiety   . Arthritis   . Asthma    seasonal asthma rarely   . Chronic kidney disease    renal mass right kidney   . CPAP (continuous positive airway pressure) dependence   . Diverticulitis   . Family history of cancer of extrahepatic bile ducts   . Family history of colon cancer   . Family history of kidney cancer   . Family history of melanoma   . GERD (gastroesophageal reflux disease)   . Hemorrhoids   . History of kidney stones   . HTN (hypertension) 07/29/2014  . Hyperlipemia   . Hypertension   . Migraine    no migraines now, will have stress headaches  . Multinodular goiter 08/21/2017   Last Assessment & Plan:  She will need an f/u u/s of thyroid to reassess nodules. Repeat in 07/2018 Nodules are too small for biopsy now.  . 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 Papillary thyroid carcinoma ([place]) 03/10/2018  . PONV (postoperative nausea and vomiting)    patient reports being "slower to wake than average "   . Pre-diabetes    "ive been told i'm borderline"   . Sleep apnea    CPAP use    Past Surgical History:  Procedure Laterality Date  . ABDOMINAL HYSTERECTOMY    . APPENDECTOMY  1978  . BIOPSY  07/08/2017   Procedure: BIOPSY;  Surgeon: Rogene Houston, MD;  [facility];  Service: Endoscopy;;  gastric  . CHOLECYSTECTOMY  2005  . COLONOSCOPY WITH PROPOFOL N/A 10/28/2017   Procedure: COLONOSCOPY WITH PROPOFOL;  Surgeon: Rogene Houston, MD;  [facility];  Service: Endoscopy;  Laterality: N/A;  7:30  . ESOPHAGOGASTRODUODENOSCOPY N/A 07/08/2017   Procedure: ESOPHAGOGASTRODUODENOSCOPY (EGD);  Surgeon: Rogene Houston, MD;  [facility];  Service: Endoscopy;  Laterality: N/A;  220  . LUMBAR LAMINECTOMY/DECOMPRESSION MICRODISCECTOMY N/A 09/26/2018   Procedure: L4-5 MICRODISCECTOMY;  Surgeon: [name], MD;  Location: [facility];  Service: Orthopedics;  Laterality: N/A;  . NASAL SEPTUM SURGERY    . ROBOTIC ASSITED PARTIAL NEPHRECTOMY Right 05/17/2017   Procedure: XI ROBOTIC ASSITED PARTIAL NEPHRECTOMY;  Surgeon: Alexis Frock, MD;  Location: [facility];  Service: Urology;  Laterality: Right;  . Ruptured Disk L5    . THYROIDECTOMY N/A 03/10/2018   Procedure: TOTAL THYROIDECTOMY;  Surgeon: Aviva Signs, MD;  Location: [facility];  Service: General;  Laterality: N/A;   Social History   Socioeconomic History  . Marital status: Widowed    Spouse name: Not on file  . Number of children: Not on file  . Years of education: Not on file  . Highest education level: Not on file  Occupational History  . Not on file  Tobacco Use  . Smoking status: Never Smoker  . Smokeless tobacco: Never Used  Vaping Use  . Vaping Use: Never used  Substance and Sexual Activity  .  Alcohol use: Yes    Comment: rarely   . Drug use: No  . Sexual activity: Not Currently  Other Topics Concern  . Not on file  Social History Narrative  . Not on file   Social Determinants of Health   Financial Resource Strain: Not on file  Food Insecurity: Not on file  Transportation Needs: Not on file  Physical Activity: Not on file  Stress: Not on file  Social Connections: Not on file   Outpatient Encounter Medications as of 09/29/2020  Medication Sig  . famotidine (PEPCID) 20 MG tablet Take 20 mg by mouth at bedtime. As needed  . diphenhydrAMINE (BENADRYL ALLERGY) 25 MG tablet Take 1 tablet (25 mg total) by mouth at bedtime. 2 days before and 3 days after the contrast.  . hydrochlorothiazide (HYDRODIURIL) 25 MG tablet TAKE 1/2 TABLET BY MOUTH DAILY  . HYDROcodone-acetaminophen (NORCO/VICODIN) 5-325 MG tablet Take 1 tablet by mouth every 6 (six) hours as needed  for moderate pain. (Patient not taking: Reported on 11/04/2019)  . levothyroxine (SYNTHROID) 112 MCG tablet Take 1 tablet (112 mcg total) by mouth daily.  Marland Kitchen losartan (COZAAR) 100 MG tablet TAKE 1 TABLET ONCE DAILY.  . methocarbamol (ROBAXIN) 500 MG tablet Take 1 tablet (500 mg total) by mouth every 8 (eight) hours as needed for muscle spasms.  . metroNIDAZOLE (METROGEL) 0.75 % gel Apply topically.  . nortriptyline (PAMELOR) 10 MG capsule TAKE 1-4 TABLETS NIGHTLY AS INSTRUCTED  . potassium chloride SA (K-DUR,KLOR-CON) 20 MEQ tablet TAKE 1 TABLET TWICE DAILY FOR 3 DAYS THEN 1 TABLET DAILY.  Marland Kitchen predniSONE (DELTASONE) 10 MG tablet Take 1 tablet (10 mg total) by mouth daily with breakfast. 3 days before  - 2 days after the contrast.  . pregabalin (LYRICA) 50 MG capsule Take 50 mg by mouth 2 (two) times daily.  . simvastatin (ZOCOR) 40 MG tablet Take 40 mg by mouth daily at 6 PM.   . [DISCONTINUED] clonazePAM (KLONOPIN) 0.5 MG tablet Take 0.5 mg by mouth 2 (two) times daily as needed for anxiety.   . [DISCONTINUED] oxybutynin (DITROPAN-XL) 5 MG 24 hr tablet Take by mouth.   No facility-administered encounter medications on file as of 09/29/2020.   ALLERGIES: Allergies  Allergen Reactions  . Iodinated Diagnostic Agents Hives and Itching  . Mango Flavor Swelling and Rash    Tongue swelling, feels like throat is closing in on her   . Isovue [Iopamidol] Hives and Itching    And hives   . Nitrofurantoin Hives  . Other Hives, Itching, Rash and Other (See Comments)    Use Paper tape ONLY  . Sulfa Antibiotics Hives  . Adhesive [Tape] Rash and Other (See Comments)    Use Paper tape ONLY    VACCINATION STATUS: Immunization History  Administered Date(s) Administered  . Influenza,inj,Quad PF,6+ Mos 06/06/2018  . Influenza-Unspecified 06/24/2017, 06/11/2019  . Moderna Sars-Covid-2 Vaccination 04/25/2020, 05/30/2020  . Tdap 09/16/2018    HPI Rebecca Scott is 65 y.o. female who is returning for  follow-up after recent near total thyroidectomy for thyroid malignancy, and postsurgical hypothyroidism.    -She underwent near total thyroidectomy on March 10, 2018 which revealed bilateral follicular variant papillary thyroid carcinoma Pathologic Stage Classification (pTNM, AJCC 8th Edition): pT2(multifocal), pNX.  She is currently on Synthroid 125 mcg p.o. daily before breakfast.  she reports compliance. -She has recovered from her surgery very well.  She denies dysphagia, odynophagia, voice change, nor shortness of breath.  Her most recent thyroid instrument  whole-body scan shows a single focus of increased radiotracer uptake-detailed below. -Prior to her last visit,  she underwent CT scan of her neck which did not show any definite lesion corresponding to the area of concern on nuclear medicine.  Prior to this visit on September 15, 2020 she underwent thyroid/neck ultrasound: Small teardrop focus of residual thyroid tissue in the left resection.  Measuring 1.2 x 0.8 cm with no evidence of nodularity.  1.2 cm complex cyst within the left submandibular gland with differentials including cyst, focal ductal ectasia, abscess, less likely cystic neoplasm.  Consider repeat ultrasound in 3 to 69-month to assess stability.   Her prior history:  -She was found to have multinodular goiter in November 2018.  At that time she was found to have 2.4 cm nodule in the 5 cm right lobe of thyroid and 1.5 cm nodule in the 4.3 cm left lobe.  Repeat ultrasound in April 2019 showed thyroid remaining more or less the same size however the nodules were observed to grow to 2.7 cm on the right lobe and 1.7 cm in the left lobe.  Subsequently, earlier this month she underwent fine-needle aspiration of the right lobe nodule which showed follicular lesion of undetermined significance.  Molecular studies were not sent.   -Prior to her surgery, patient complained of  foreign body sensation and occasional choking in her neck while  eating and breathing problem when she was laying on her back.   -She denies any exposure to neck radiation.  She denies family history of thyroid cancer, however multiple family members with what appears to be hypothyroidism.  She denies heat/cold intolerance.   -Her previsit thyroid/neck ultrasound is consistent with surgical changes with no evidence of residual or recurrent thyroid malignancy. -She is currently on levothyroxine 112 mcg p.o. daily.  Reports good compliance.  Her previsit labs are consistent with slight over replacement.    Review of Systems Limited as above.  Objective:    BP 126/84   Pulse 72   Ht 5\' 3"  (1.6 m)   Wt 209 lb (94.8 kg)   BMI 37.02 kg/m   Wt Readings from Last 3 Encounters:  09/29/20 209 lb (94.8 kg)  05/03/20 216 lb 12.8 oz (98.3 kg)  03/29/20 214 lb 12.8 oz (97.4 kg)     Diabetic Labs (most recent): Lab Results  Component Value Date   HGBA1C 5.1 09/08/2018   HGBA1C 5.4 03/06/2018   HGBA1C 5.7 (H) 05/09/2017     Recent Results (from the past 2160 hour(s))  TSH     Status: Abnormal   Collection Time: 09/27/20 12:00 AM  Result Value Ref Range   TSH 0.29 (L) 0.40 - 4.50 mIU/L  T4, Free     Status: None   Collection Time: 09/27/20 12:00 AM  Result Value Ref Range   Free T4 1.2 0.8 - 1.8 ng/dL  Thyroglobulin Level     Status: Abnormal   Collection Time: 09/27/20 12:00 AM  Result Value Ref Range   Thyroglobulin <0.1 (L) ng/mL    Comment:       Reference Range:       Intact Thyroid   2.8-40.9       Athyrotic        <0.1 .       Note: Abnormal flagging is based       on the reference interval for        patients with intact thyroid. . . This test was performed using the  Beckman Coulter  chemiluminescent method. Values obtained from different assay methods cannot be used interchangeably. Thyroglobulin levels, regardless of value, should not be interpreted as absolute evidence of the presence or absence of disease. .    Comment       Comment: . Thyroglobulin antibodies (TGAB) interfere with thyroglobulin (TG) assays; therefore, TGAB assay should always be performed in conjunction with a TG assay. . . For additional information, please refer to  http://education.questdiagnostics.com/faq/FAQ202  (This link is being provided for informational/ educational purposes only.) .   Thyroglobulin antibody     Status: None   Collection Time: 09/27/20 12:00 AM  Result Value Ref Range   Thyroglobulin Ab <1 < or = 1 IU/mL       Diagnosis 1. Thyroid, lobectomy, left lobe and isthmus - PAPILLARY THYROID CARCINOMA, FOLLICULAR VARIANT, 1.3 CM. - TUMOR CONFINED WITHIN THYROID CAPSULE. - MARGINS NOT INVOLVED. 2. Thyroid, lobectomy, right lobe - PAPILLARY THYROID CARCINOMA, FOLLICULAR VARIANT, 2.6 CM. - TUMOR FOCALLY LESS THAN 0.1 CM FROM MARGIN.   Pathologic Stage Classification (pTNM, AJCC 8th Edition): pT2(multifocal), pNX.   Thyrogen stimulated whole-body scan on June 23, 2018 FINDINGS: Radio iodine accumulation is seen at the thyroid bed bilaterally consistent with thyroid remnant. No distant sites of abnormal radio iodine accumulation are identified to suggest iodine-avid metastatic thyroid cancer.  IMPRESSION: Thyroid remnant.  No scintigraphic evidence of iodine-avid thyroid cancer metastases.  Thyroid/neck ultrasound for Jan 13, 2019- consistent with surgical changes with absence of residual or recurrent thyroid disease.    Most recent Thyrogen stimulated whole-body scan on Feb 01, 2020 FINDINGS: A single small focus of increased radiotracer uptake within the lower left neck.  Normal physiologic uptake identified within the oropharynx and GI tract.  IMPRESSION: Single, small focus of increased radiotracer uptake noted within the left lower lobe neck worrisome for residual/recurrent functioning thyroid tissue.   CT scan of neck on March 25, 2020: IMPRESSION: No mass or adenopathy. No  definite finding corresponding to area of increased uptake on nuclear study.  Thyroid/neck ultrasound on September 15, 2020 IMPRESSION: 1. Surgical changes of prior thyroid resection. 2. Small teardrop shaped focus of residual thyroid tissue in the left resection bed measuring 0.8 x 1.2 x 0.6 cm. No evidence of thyroid nodule. 3. Complex cyst within the left submandibular gland measures up to 1.2 x 0.8 x 0.8 cm. Differential considerations include cyst, focal ductal ectasia, abscess, and less likely, cystic neoplasm. Consider repeat left submandibular ultrasound in 3-6 months to assess for stability.  Assessment & Plan:   1.  Follicular variant of papillary thyroid cancer  -She is status post negative her thyroidectomy on March 10, 2018 with Dr. Aviva Signs resulting in  Pathologic Stage Classification (pTNM, AJCC 8th Edition): pT2(multifocal), pNX.  -She he is status post radioactive iodine remnant ablation followed by whole body scan which was completed on June 24, 2018 at [facility].  -The whole body scan revealed some thyroid remnant in the thyroid bed, no evidence of distant metastasis.  Her previsit thyroid/neck ultrasound is consistent with surgical changes of absent residual or recurrent thyroid disease. -She willd not need any further intervention at this time, -I had a long discussion with her regarding the need for continued monitoring with yearly imaging studies for next 5-10 years.  Her thyroglobulin levels are low at <0.1 with negative thyroglobulin antibodies.  -Her recent Thyrogen stimulated whole-body scan showed single, small focus of increased radiotracer uptake within the left lower lobe worrisome for residual/recurrent  functioning thyroid tissue.    -Her previsit CT neck/thyroid was unremarkable-no mass or adenopathy.  No definitive finding corresponding to the area of increased uptake on nuclear study.    September 15, 2020 thyroid/neck ultrasound confirming  possible residual thyroid tissue measuring 1.2 cm on the left thyroid bed, and 1.2 cm complex cyst within the left submandibular gland differentials include ductal ectasia, abscess, cyst, or less likely cystic neoplasm.  She will be considered for repeat ultrasound before her next visit in May 2022.       2.  Postsurgical hypothyroidism  -Her previsit thyroid function tests are consistent with appropriate replacement.  She is advised to continue levothyroxine 112 mcg p.o. daily before breakfast.     - We discussed about the correct intake of her thyroid hormone, on empty stomach at fasting, with water, separated by at least 30 minutes from breakfast and other medications,  and separated by more than 4 hours from calcium, iron, multivitamins, acid reflux medications (PPIs). -Patient is made aware of the fact that thyroid hormone replacement is needed for life, dose to be adjusted by periodic monitoring of thyroid function tests.    - I advised her  to maintain close follow up with Henderson Baltimore, MD for primary care needs.       - Time spent on this patient care encounter:  20 minutes of which 50% was spent in  counseling and the rest reviewing  her current and  previous labs / studies and medications  doses and developing a plan for long term care. Rebecca Scott  participated in the discussions, expressed understanding, and voiced agreement with the above plans.  All questions were answered to her satisfaction. she is encouraged to contact clinic should she have any questions or concerns prior to her return visit.    Follow up plan: Return in about 5 months (around 02/27/2021) for F/U with Pre-visit Labs, Thyroid / Neck Ultrasound.   Glade Lloyd, MD [facility] [address] [place], [place] [address] Phone: [phone]  Fax: [phone]     09/29/2020, 3:00 PM  This note was partially dictated with voice recognition  software. Similar sounding words can be transcribed inadequately or may not  be corrected upon review.

## 2020-10-22 ENCOUNTER — Other Ambulatory Visit: Payer: Self-pay | Admitting: "Endocrinology

## 2020-10-24 ENCOUNTER — Other Ambulatory Visit: Payer: Self-pay

## 2020-10-24 DIAGNOSIS — E890 Postprocedural hypothyroidism: Secondary | ICD-10-CM

## 2020-10-24 MED ORDER — LEVOTHYROXINE SODIUM 112 MCG PO TABS: 112.0000 ug | ORAL_TABLET | Freq: Every day | ORAL | 1 refills | Status: DC

## 2020-10-27 ENCOUNTER — Encounter (HOSPITAL_COMMUNITY): Payer: Self-pay

## 2020-10-28 ENCOUNTER — Other Ambulatory Visit (HOSPITAL_COMMUNITY): Payer: Self-pay

## 2020-10-28 MED ORDER — PREDNISONE 50 MG PO TABS: ORAL_TABLET | ORAL | 0 refills | Status: DC

## 2020-10-28 MED ORDER — DIPHENHYDRAMINE HCL 50 MG PO TABS: 50.0000 mg | ORAL_TABLET | Freq: Once | ORAL | 0 refills | Status: DC

## 2020-11-08 ENCOUNTER — Encounter: Payer: Self-pay | Admitting: Neurology

## 2020-11-08 ENCOUNTER — Telehealth: Payer: Self-pay | Admitting: Neurology

## 2020-11-08 DIAGNOSIS — G4733 Obstructive sleep apnea (adult) (pediatric): Secondary | ICD-10-CM

## 2020-11-08 NOTE — Telephone Encounter (Signed)
My chart message see message from 11/08/20 for reference.

## 2020-11-08 NOTE — Telephone Encounter (Signed)
Please call patient or email her back: I have placed a referral to [facility] for evaluation and treatment of her sleep apnea with an oral appliance.  Please see her MyChart message for reference.

## 2020-11-08 NOTE — Telephone Encounter (Signed)
Thanks Barista for [employer] . Referral sent to [facility] . Sleep study attached .

## 2020-11-10 ENCOUNTER — Inpatient Hospital Stay (HOSPITAL_COMMUNITY): Payer: Medicare Other | Attending: Hematology

## 2020-11-10 ENCOUNTER — Ambulatory Visit (HOSPITAL_COMMUNITY)
Admission: RE | Admit: 2020-11-10 | Discharge: 2020-11-10 | Disposition: A | Discharge status: Home / Self Care | Payer: Medicare Other | Source: Ambulatory Visit | Attending: Hematology | Admitting: Hematology

## 2020-11-10 ENCOUNTER — Other Ambulatory Visit: Payer: Self-pay

## 2020-11-10 DIAGNOSIS — Z85528 Personal history of other malignant neoplasm of kidney: Secondary | ICD-10-CM | POA: Diagnosis present

## 2020-11-10 DIAGNOSIS — Z905 Acquired absence of kidney: Secondary | ICD-10-CM | POA: Diagnosis not present

## 2020-11-10 DIAGNOSIS — G8929 Other chronic pain: Secondary | ICD-10-CM | POA: Insufficient documentation

## 2020-11-10 DIAGNOSIS — Z79899 Other long term (current) drug therapy: Secondary | ICD-10-CM | POA: Diagnosis not present

## 2020-11-10 DIAGNOSIS — D649 Anemia, unspecified: Secondary | ICD-10-CM | POA: Insufficient documentation

## 2020-11-10 DIAGNOSIS — M545 Low back pain, unspecified: Secondary | ICD-10-CM | POA: Insufficient documentation

## 2020-11-10 DIAGNOSIS — Z85850 Personal history of malignant neoplasm of thyroid: Secondary | ICD-10-CM | POA: Diagnosis present

## 2020-11-10 DIAGNOSIS — C73 Malignant neoplasm of thyroid gland: Secondary | ICD-10-CM | POA: Insufficient documentation

## 2020-11-10 LAB — COMPREHENSIVE METABOLIC PANEL
ALT: 31 U/L (ref 0–44)
AST: 26 U/L (ref 15–41)
Albumin: 3.9 g/dL (ref 3.5–5.0)
Alkaline Phosphatase: 123 U/L (ref 38–126)
Anion gap: 11 (ref 5–15)
BUN: 12 mg/dL (ref 8–23)
CO2: 21 mmol/L — ABNORMAL LOW (ref 22–32)
Calcium: 9.6 mg/dL (ref 8.9–10.3)
Chloride: 105 mmol/L (ref 98–111)
Creatinine, Ser: 0.78 mg/dL (ref 0.44–1.00)
GFR, Estimated: >60 mL/min (ref >60)
Glucose, Bld: 200 mg/dL — ABNORMAL HIGH (ref 70–99)
Potassium: 3.8 mmol/L (ref 3.5–5.1)
Sodium: 137 mmol/L (ref 135–145)
Total Bilirubin: 0.7 mg/dL (ref 0.3–1.2)
Total Protein: 7.5 g/dL (ref 6.5–8.1)

## 2020-11-10 LAB — CBC WITH DIFFERENTIAL/PLATELET
Abs Immature Granulocytes: 0.02 10*3/uL (ref 0.00–0.07)
Basophils Absolute: 0 10*3/uL (ref 0.0–0.1)
Basophils Relative: 0 %
Eosinophils Absolute: 0 10*3/uL (ref 0.0–0.5)
Eosinophils Relative: 0 %
HCT: 37.1 % (ref 36.0–46.0)
Hemoglobin: 11.5 g/dL — ABNORMAL LOW (ref 12.0–15.0)
Immature Granulocytes: 0 %
Lymphocytes Relative: 13 %
Lymphs Abs: 1 10*3/uL (ref 0.7–4.0)
MCH: 27.6 pg (ref 26.0–34.0)
MCHC: 31 g/dL (ref 30.0–36.0)
MCV: 89 fL (ref 80.0–100.0)
Monocytes Absolute: 0.1 10*3/uL (ref 0.1–1.0)
Monocytes Relative: 1 %
Neutro Abs: 6.1 10*3/uL (ref 1.7–7.7)
Neutrophils Relative %: 86 %
Platelets: 164 10*3/uL (ref 150–400)
RBC: 4.17 MIL/uL (ref 3.87–5.11)
RDW: 15.7 % — ABNORMAL HIGH (ref 11.5–15.5)
WBC: 7.1 10*3/uL (ref 4.0–10.5)
nRBC: 0 % (ref 0.0–0.2)

## 2020-11-10 LAB — FERRITIN: Ferritin: 64 ng/mL (ref 11–307)

## 2020-11-10 LAB — IRON AND TIBC
Iron: 56 ug/dL (ref 28–170)
Saturation Ratios: 14 % (ref 10.4–31.8)
TIBC: 391 ug/dL (ref 250–450)
UIBC: 335 ug/dL

## 2020-11-10 LAB — TSH: TSH: 0.097 u[IU]/mL — ABNORMAL LOW (ref 0.350–4.500)

## 2020-11-10 LAB — LACTATE DEHYDROGENASE: LDH: 174 U/L (ref 98–192)

## 2020-11-10 LAB — VITAMIN B12: Vitamin B-12: 255 pg/mL (ref 180–914)

## 2020-11-10 MED ORDER — IOHEXOL 300 MG/ML  SOLN
100.0000 mL | Freq: Once | INTRAMUSCULAR | Status: AC | PRN
  Administered 2020-11-10: 100 mL via INTRAVENOUS

## 2020-11-15 ENCOUNTER — Other Ambulatory Visit: Payer: Self-pay

## 2020-11-15 ENCOUNTER — Inpatient Hospital Stay (HOSPITAL_BASED_OUTPATIENT_CLINIC_OR_DEPARTMENT_OTHER): Payer: Medicare Other | Admitting: Hematology

## 2020-11-15 VITALS — BP 134/63 | HR 71 | Temp 98.1°F | Resp 18 | Wt 209.2 lb

## 2020-11-15 DIAGNOSIS — Z85528 Personal history of other malignant neoplasm of kidney: Secondary | ICD-10-CM | POA: Diagnosis not present

## 2020-11-15 DIAGNOSIS — Z85850 Personal history of malignant neoplasm of thyroid: Secondary | ICD-10-CM

## 2020-11-15 DIAGNOSIS — C73 Malignant neoplasm of thyroid gland: Secondary | ICD-10-CM | POA: Diagnosis not present

## 2020-11-15 NOTE — Patient Instructions (Signed)
[place] at [facility] Discharge Instructions  You were seen today by Dr. Delton Coombes. He went over your recent results and scans. You will be scheduled to have a CT scan of your abdomen done before your next visit. Dr. Delton Coombes will see you back in 1 year for labs and follow up.   Thank you for choosing [place] at [facility] to provide your oncology and hematology care.  To afford each patient quality time with our provider, please arrive at least 15 minutes before your scheduled appointment time.   If you have a lab appointment with the [facility] please come in thru the Main Entrance and check in at the main information desk  You need to re-schedule your appointment should you arrive 10 or more minutes late.  We strive to give you quality time with our providers, and arriving late affects you and other patients whose appointments are after yours.  Also, if you no show three or more times for appointments you may be dismissed from the clinic at the providers discretion.     Again, thank you for choosing [facility].  Our hope is that these requests will decrease the amount of time that you wait before being seen by our physicians.       _____________________________________________________________  Should you have questions after your visit to [facility], please contact our office at [phone] between the hours of 8:00 a.m. and 4:30 p.m.  Voicemails left after 4:00 p.m. will not be returned until the following business day.  For prescription refill requests, have your pharmacy contact our office and allow 72 hours.    Cancer Center Support Programs:   > Cancer Support Group  2nd Tuesday of the month 1pm-2pm, Journey Room

## 2020-11-15 NOTE — Progress Notes (Signed)
Rebecca Scott, Rebecca Scott   CLINIC:  Medical Oncology/Hematology  PCP:  Rebecca Baltimore, MD None None   REASON FOR VISIT:  Follow-up for renal cell carcinoma  PRIOR THERAPY: Right partial nephrectomy on 05/17/2017  NGS Results: Not done  CURRENT THERAPY: Observation  BRIEF ONCOLOGIC HISTORY:  Oncology History  Malignant neoplasm of thyroid gland ([place])  05/27/2018 Initial Diagnosis   Malignant neoplasm of thyroid gland ([place])   12/02/2018 Genetic Testing   ATM (Gain (Exons 62-63) copy number = 3 VUS identified on the multicancer panel.  The Multi-Gene Panel offered by [facility] includes sequencing and/or deletion duplication testing of the following 85 genes: AIP, ALK, APC, ATM, AXIN2,BAP1,  BARD1, BLM, BMPR1A, BRCA1, BRCA2, BRIP1, CASR, CDC73, CDH1, CDK4, CDKN1B, CDKN1C, CDKN2A (p14ARF), CDKN2A (p16INK4a), CEBPA, CHEK2, CTNNA1, DICER1, DIS3L2, EGFR (c.2369C>T, p.Thr790Met variant only), EPCAM (Deletion/duplication testing only), FH, FLCN, GATA2, GPC3, GREM1 (Promoter region deletion/duplication testing only), HOXB13 (c.251G>A, p.Gly84Glu), HRAS, KIT, MAX, MEN1, MET, MITF (c.952G>A, p.Glu318Lys variant only), MLH1, MSH2, MSH3, MSH6, MUTYH, NBN, NF1, NF2, NTHL1, PALB2, PDGFRA, PHOX2B, PMS2, POLD1, POLE, POT1, PRKAR1A, PTCH1, PTEN, RAD50, RAD51C, RAD51D, RB1, RECQL4, RET, RNF43, RUNX1, SDHAF2, SDHA (sequence changes only), SDHB, SDHC, SDHD, SMAD4, SMARCA4, SMARCB1, SMARCE1, STK11, SUFU, TERC, TERT, TMEM127, TP53, TSC1, TSC2, VHL, WRN and WT1.  The report date is December 02, 2018.     CANCER STAGING: Cancer Staging No matching staging information was found for the patient.  INTERVAL HISTORY:  Rebecca Scott, a 65 y.o. female, returns for routine follow-up of her renal cell carcinoma. Rebecca Scott was last seen on 05/03/2020.   Today she reports feeling okay. She denies having any new pains. Her lower back pain is chronic. She denies having any  melena, hematochezia or hematuria. Her Synthroid was decreased to 112 mcg in fall 2021. She does not take iron tablets.  Her thyroid function is followed by Dr. Dorris Fetch.   REVIEW OF SYSTEMS:  Review of Systems  Constitutional: Positive for appetite change (50%) and fatigue (50%).  Gastrointestinal: Negative for blood in stool.  Genitourinary: Negative for hematuria.   Musculoskeletal: Positive for back pain (3/10 lower back pain).  All other systems reviewed and are negative.   PAST MEDICAL/SURGICAL HISTORY:  Past Medical History:  Diagnosis Date  . Anemia   . Anxiety   . Arthritis   . Asthma    seasonal asthma rarely   . Chronic kidney disease    renal mass right kidney   . CPAP (continuous positive airway pressure) dependence   . Diverticulitis   . Family history of cancer of extrahepatic bile ducts   . Family history of colon cancer   . Family history of kidney cancer   . Family history of melanoma   . GERD (gastroesophageal reflux disease)   . Hemorrhoids   . History of kidney stones   . HTN (hypertension) 07/29/2014  . Hyperlipemia   . Hypertension   . Migraine    no migraines now, will have stress headaches  . Multinodular goiter 08/21/2017   Last Assessment & Plan:  She will need an f/u u/s of thyroid to reassess nodules. Repeat in 07/2018 Nodules are too small for biopsy now.  . 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 Papillary thyroid carcinoma ([place]) 03/10/2018  . PONV (postoperative nausea and vomiting)    patient reports being "slower to wake than average "   . Pre-diabetes    "  ive been told i'm borderline"   . Sleep apnea    CPAP use    Past Surgical History:  Procedure Laterality Date  . ABDOMINAL HYSTERECTOMY    . APPENDECTOMY  1978  . BIOPSY  07/08/2017   Procedure: BIOPSY;  Surgeon: Rogene Houston, MD;  Location: [facility];  Service: Endoscopy;;  gastric  . CHOLECYSTECTOMY  2005  . COLONOSCOPY WITH  PROPOFOL N/A 10/28/2017   Procedure: COLONOSCOPY WITH PROPOFOL;  Surgeon: Rogene Houston, MD;  Location: [facility];  Service: Endoscopy;  Laterality: N/A;  7:30  . ESOPHAGOGASTRODUODENOSCOPY N/A 07/08/2017   Procedure: ESOPHAGOGASTRODUODENOSCOPY (EGD);  Surgeon: Rogene Houston, MD;  [facility]: [facility];  Service: Endoscopy;  Laterality: N/A;  220  . LUMBAR LAMINECTOMY/DECOMPRESSION MICRODISCECTOMY N/A 09/26/2018   Procedure: L4-5 MICRODISCECTOMY;  Surgeon: [name], MD;  Location: [facility];  Service: Orthopedics;  Laterality: N/A;  . NASAL SEPTUM SURGERY    . ROBOTIC ASSITED PARTIAL NEPHRECTOMY Right 05/17/2017   Procedure: XI ROBOTIC ASSITED PARTIAL NEPHRECTOMY;  Surgeon: Alexis Frock, MD;  Location: [facility];  Service: Urology;  Laterality: Right;  . Ruptured Disk L5    . THYROIDECTOMY N/A 03/10/2018   Procedure: TOTAL THYROIDECTOMY;  Surgeon: Aviva Signs, MD;  Location: [facility];  Service: General;  Laterality: N/A;    SOCIAL HISTORY:  Social History   Socioeconomic History  . Marital status: Widowed    Spouse name: Not on file  . Number of children: Not on file  . Years of education: Not on file  . Highest education level: Not on file  Occupational History  . Not on file  Tobacco Use  . Smoking status: Never Smoker  . Smokeless tobacco: Never Used  Vaping Use  . Vaping Use: Never used  Substance and Sexual Activity  . Alcohol use: Yes    Comment: rarely   . Drug use: No  . Sexual activity: Not Currently  Other Topics Concern  . Not on file  Social History Narrative  . Not on file   Social Determinants of Health   Financial Resource Strain: Not on file  Food Insecurity: Not on file  Transportation Needs: Not on file  Physical Activity: Not on file  Stress: Not on file  Social Connections: Not on file  Intimate Partner Violence: Not on file    FAMILY HISTORY:  Family History  Problem Relation Age of Onset  . COPD Mother   . Diabetes Mother   .  Hypertension Mother   . High Cholesterol Mother   . Liver cancer Mother        bile duct cancer  . COPD Sister   . Hypertension Brother   . High Cholesterol Brother   . Kidney cancer Brother 33  . Hypertension Brother   . Asthma Son   . Congenital heart disease Son        hypoplastic left heart syndrome  . Melanoma Maternal Aunt        dx in her 9s  . Brain cancer Maternal Grandmother        ? benign  . Heart disease Maternal Grandfather   . Colon cancer Other   . Lung disease Maternal Uncle   . Cancer Cousin        ? lunc cancer; mother's mat first cousin    CURRENT MEDICATIONS:  Current Outpatient Medications  Medication Sig Dispense Refill  . famotidine (PEPCID) 20 MG tablet Take 20 mg by mouth at bedtime. As  needed    . hydrochlorothiazide (HYDRODIURIL) 25 MG tablet TAKE 1/2 TABLET BY MOUTH DAILY (Patient taking differently: Take 12 mg by mouth daily.) 45 tablet 0  . levothyroxine (SYNTHROID) 112 MCG tablet Take 1 tablet (112 mcg total) by mouth daily. 90 tablet 1  . losartan (COZAAR) 100 MG tablet TAKE 1 TABLET ONCE DAILY. 90 tablet 1  . methocarbamol (ROBAXIN) 500 MG tablet Take 1 tablet (500 mg total) by mouth every 8 (eight) hours as needed for muscle spasms. 20 tablet 0  . metroNIDAZOLE (METROGEL) 0.75 % gel Apply topically.    . nortriptyline (PAMELOR) 10 MG capsule TAKE 1-4 TABLETS NIGHTLY AS INSTRUCTED    . potassium chloride SA (K-DUR,KLOR-CON) 20 MEQ tablet TAKE 1 TABLET TWICE DAILY FOR 3 DAYS THEN 1 TABLET DAILY. 30 tablet 5  . predniSONE (DELTASONE) 50 MG tablet Take 1 tablet 13 hrs, 7 hrs and 1 hr prior to CT 3 tablet 0  . pregabalin (LYRICA) 50 MG capsule Take 50 mg by mouth 2 (two) times daily.    . diphenhydrAMINE (BENADRYL) 50 MG tablet Take 1 tablet (50 mg total) by mouth once for 1 dose. 1 hour prior to CT 1 tablet 0  . HYDROcodone-acetaminophen (NORCO/VICODIN) 5-325 MG tablet Take 1 tablet by mouth every 6 (six) hours as needed for moderate pain. (Patient  not taking: Reported on 11/04/2019) 20 tablet 0  . simvastatin (ZOCOR) 40 MG tablet Take 40 mg by mouth daily at 6 PM.      No current facility-administered medications for this visit.    ALLERGIES:  Allergies  Allergen Reactions  . Iodinated Diagnostic Agents Hives and Itching  . Mango Flavor Swelling and Rash    Tongue swelling, feels like throat is closing in on her   . Isovue [Iopamidol] Hives and Itching    And hives   . Nitrofurantoin Hives  . Other Hives, Itching, Rash and Other (See Comments)    Use Paper tape ONLY  . Sulfa Antibiotics Hives  . Adhesive [Tape] Rash and Other (See Comments)    Use Paper tape ONLY    PHYSICAL EXAM:  Performance status (ECOG): 1 - Symptomatic but completely ambulatory  Vitals:   11/15/20 1518  BP: 134/63  Pulse: 71  Resp: 18  Temp: 98.1 F (36.7 C)  SpO2: 100%   Wt Readings from Last 3 Encounters:  11/15/20 209 lb 3.5 oz (94.9 kg)  09/29/20 209 lb (94.8 kg)  05/03/20 216 lb 12.8 oz (98.3 kg)   Physical Exam Vitals reviewed.  Constitutional:      Appearance: Normal appearance. She is obese.  Cardiovascular:     Rate and Rhythm: Normal rate and regular rhythm.     Pulses: Normal pulses.     Heart sounds: Normal heart sounds.  Pulmonary:     Effort: Pulmonary effort is normal.     Breath sounds: Normal breath sounds.  Chest:  Breasts:     Right: No axillary adenopathy or supraclavicular adenopathy.     Left: No axillary adenopathy or supraclavicular adenopathy.    Abdominal:     Palpations: Abdomen is soft. There is no hepatomegaly, splenomegaly or mass.     Tenderness: There is no abdominal tenderness.     Hernia: No hernia is present.  Musculoskeletal:     Right lower leg: No edema.     Left lower leg: No edema.  Lymphadenopathy:     Upper Body:     Right upper body: No supraclavicular, axillary or  pectoral adenopathy.     Left upper body: No supraclavicular, axillary or pectoral adenopathy.  Neurological:      General: No focal deficit present.     Mental Status: She is alert and oriented to person, place, and time.  Psychiatric:        Mood and Affect: Mood normal.        Behavior: Behavior normal.      LABORATORY DATA:  I have reviewed the labs as listed.  CBC Latest Ref Rng & Units 11/10/2020 05/03/2020 10/26/2019  WBC 4.0 - 10.5 K/uL 7.1 5.7 5.3  Hemoglobin 12.0 - 15.0 g/dL 11.5(L) 11.0(L) 11.8(L)  Hematocrit 36.0 - 46.0 % 37.1 36.2 38.0  Platelets 150 - 400 K/uL 164 132(L) 166   CMP Latest Ref Rng & Units 11/10/2020 05/03/2020 03/25/2020  Glucose 70 - 99 mg/dL 200(H) 164(H) -  BUN 8 - 23 mg/dL 12 11 -  Creatinine 0.44 - 1.00 mg/dL 0.78 0.73 0.70  Sodium 135 - 145 mmol/L 137 140 -  Potassium 3.5 - 5.1 mmol/L 3.8 3.8 -  Chloride 98 - 111 mmol/L 105 108 -  CO2 22 - 32 mmol/L 21(L) 24 -  Calcium 8.9 - 10.3 mg/dL 9.6 9.1 -  Total Protein 6.5 - 8.1 g/dL 7.5 6.8 -  Total Bilirubin 0.3 - 1.2 mg/dL 0.7 0.6 -  Alkaline Phos 38 - 126 U/L 123 86 -  AST 15 - 41 U/L 26 22 -  ALT 0 - 44 U/L 31 26 -   Lab Results  Component Value Date   LDH 174 11/10/2020   LDH 186 05/03/2020   LDH 175 10/26/2019   Lab Results  Component Value Date   TIBC 391 11/10/2020   TIBC 395 05/03/2020   TIBC 409 10/26/2019   FERRITIN 64 11/10/2020   FERRITIN 44 05/03/2020   FERRITIN 84 10/26/2019   IRONPCTSAT 14 11/10/2020   IRONPCTSAT 11 05/03/2020   IRONPCTSAT 16 10/26/2019    DIAGNOSTIC IMAGING:  I have independently reviewed the scans and discussed with the patient. CT Abdomen Pelvis W Contrast  Result Date: 11/11/2020 CLINICAL DATA:  65 year old female with history of stage I mixed papillary renal cell carcinoma status post robotic right partial nephrectomy 05/17/2017. Restaging/surveillance study EXAM: CT ABDOMEN AND PELVIS WITH CONTRAST TECHNIQUE: Multidetector CT imaging of the abdomen and pelvis was performed using the standard protocol following bolus administration of intravenous contrast. CONTRAST:   168m OMNIPAQUE IOHEXOL 300 MG/ML  SOLN COMPARISON:  Multiple priors including most recent CT abdomen and pelvis October 26, 2019. FINDINGS: Lower chest: No acute abnormality. Normal size heart. No significant pericardial effusion/thickening. Hepatobiliary: No suspicious hepatic lesion. Gallbladder is surgically absent. No biliary ductal dilation. Pancreas: Unremarkable Spleen: Unremarkable Adrenals/Urinary Tract: Bilateral adrenal glands are unremarkable. Nonenhancing 2.3 cm low-attenuation interpolar left renal lesion compatible with a renal cyst. There multiple other bilateral subcentimeter low-attenuation lesions, too small to accurately characterize, but similar to multiple prior examinations and favored to represent renal cysts. No suspicious enhancing renal lesion visualized. Urinary bladder is unremarkable for degree of distension. Stomach/Bowel: Enteric contrast visualized to the level of the distal small bowel. Stomach is grossly unremarkable. Normal positioning of the duodenum/ligament of [place]. No suspicious small bowel wall thickening or dilation. The appendix is not confidently identified however there is no pericecal inflammatory stranding to suggest acute appendicitis. Sigmoid colonic diverticulosis without findings of acute diverticulitis. Vascular/Lymphatic: Aortic atherosclerosis. No enlarged abdominal or pelvic lymph nodes. Reproductive: Status post hysterectomy. No adnexal masses. Other: No  abdominopelvic ascites. Musculoskeletal: Multilevel degenerative change of the spine. No suspicious lytic or blastic lesion of bone. IMPRESSION: 1. No evidence of recurrence or metastatic disease within the abdomen or pelvis. 2. Sigmoid colonic diverticulosis without findings of acute diverticulitis. 3. Aortic atherosclerosis.  Aortic Atherosclerosis (ICD10-I70.0). Electronically Signed   By: Dahlia Bailiff MD   On: 11/11/2020 14:27     ASSESSMENT:  1. 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 -CTAP from 10/26/2019 withNo findings to suggest recurrent or metastatic disease in the abdomen or pelvis. Colonic diverticulosis without evidence of acute diverticulitis. Bilateral renal cysts which are simple cysts and stable. -She has moved to [facility].  2. Thyroid cancer: -She underwent thyroidectomy and radioactive iodine treatment. -Synthroid was increased to 125 mcg on 07/30/2019. -She follows up with Dr. Dorris Fetch.  I-131 scan on 02/01/2020 showed small single focus of increased uptake within the left lower lobe neck -CT neck soft tissue on 03/25/2020 did not show any mass or adenopathy.  No definite finding corresponding to area of increased uptake on the nuclear study.  3. Normocytic anemia: -CBC from 10/26/2019 shows hemoglobin 11.8, hematocrit 38, normal white count and platelet count. MCV is 37.9. -K44 and folic acid were normal. Ferritin was 84 and percent saturation was 16.  4. Family history: -Because of her extensive family history, she met with our genetic counselor. -She did not have any known mutations.   PLAN:  1. Stage I (PT1AP NX) mixed papillary and clear-cell renal cell carcinoma: -She denies any hematuria.  No new onset pains. -Physical examination did not reveal any palpable masses. -Reviewed labs from 11/10/2020 which showed normal LFTs, calcium and creatinine.  CBC was grossly normal. -We reviewed CTAP with contrast from 11/10/2020 with no evidence of recurrence or metastatic disease in the abdomen and pelvis. -RTC 1 year for follow-up with repeat scan and labs.  2. Thyroid cancer: -Continue follow-up with Dr. Dorris Fetch.  3. Normocytic anemia: -Hemoglobin is 11.5 with ferritin of 64 and normal B12.  No bleeding reported.   Orders placed this encounter:  No orders of the defined types were placed in this encounter.    Derek Jack,  MD [place] [phone]   I, Milinda Antis, am acting as a scribe for Dr. Sanda Linger.  I, Derek Jack MD, have reviewed the above documentation for accuracy and completeness, and I agree with the above.

## 2020-11-22 ENCOUNTER — Other Ambulatory Visit: Payer: Self-pay | Admitting: "Endocrinology

## 2020-11-22 DIAGNOSIS — E890 Postprocedural hypothyroidism: Secondary | ICD-10-CM

## 2020-11-22 MED ORDER — LEVOTHYROXINE SODIUM 100 MCG PO TABS: 100.0000 ug | ORAL_TABLET | Freq: Every day | ORAL | 1 refills | Status: DC

## 2020-12-03 ENCOUNTER — Encounter: Payer: Self-pay | Admitting: Neurology

## 2020-12-21 ENCOUNTER — Encounter: Payer: Self-pay | Admitting: Neurology

## 2020-12-21 NOTE — Telephone Encounter (Signed)
Hilda Blades can you assist with sending records to:  [facility] Phone: [phone] Fax: [phone] [email or web address]

## 2020-12-26 ENCOUNTER — Telehealth: Payer: Self-pay | Admitting: Neurology

## 2020-12-26 NOTE — Telephone Encounter (Signed)
Dr. Rexene Alberts is out today. Sent patient Mychart message letting her know we will call her to r/s. Jinny Blossom, can you please work her in for a virtual visit?

## 2020-12-26 NOTE — Telephone Encounter (Signed)
Pt scheduled for 12/29/20 1230 virtual appt.

## 2020-12-26 NOTE — Telephone Encounter (Signed)
Will hold for next available

## 2020-12-29 ENCOUNTER — Telehealth: Payer: Self-pay | Admitting: Neurology

## 2021-01-02 ENCOUNTER — Telehealth (INDEPENDENT_AMBULATORY_CARE_PROVIDER_SITE_OTHER): Payer: Medicare Other | Admitting: Neurology

## 2021-01-02 ENCOUNTER — Encounter: Payer: Self-pay | Admitting: Neurology

## 2021-01-02 DIAGNOSIS — F458 Other somatoform disorders: Secondary | ICD-10-CM | POA: Diagnosis not present

## 2021-01-02 DIAGNOSIS — M26629 Arthralgia of temporomandibular joint, unspecified side: Secondary | ICD-10-CM | POA: Diagnosis not present

## 2021-01-02 DIAGNOSIS — Z9989 Dependence on other enabling machines and devices: Secondary | ICD-10-CM | POA: Diagnosis not present

## 2021-01-02 DIAGNOSIS — G4733 Obstructive sleep apnea (adult) (pediatric): Secondary | ICD-10-CM

## 2021-01-02 NOTE — Progress Notes (Signed)
Interim Scott:  Rebecca Scott is a 65 year old right-handed woman with an underlying medical Scott of prediabetes, papillary thyroid cancer, kidney cancer, migraine headaches, hypertension, hyperlipidemia, Scott of kidney stones, reflux disease, diverticulitis, asthma, arthritis, anemia, anxiety, and obesity, who presents for a virtual video visit via phone (MyChart video did not launch) for follow-up consultation of her obstructive sleep apnea. Rebecca patient is located in her home, Rebecca Scott was agreeable to proceeding with a phone visit instead of Rebecca video visit due to technical issues. I am located in my office. I last saw her in a virtual visit on 06/28/2020, at which time Rebecca Scott was compliant with her AutoPap machine.  Rebecca Scott also used her old machine for backup.  Rebecca Scott had moved to [facility] to be closer to Rebecca.  Rebecca Scott had establish care with a new oncologist and primary care.  Rebecca Scott was encouraged to establish care with a sleep specialist locally and transfer her DME also to a local office.    Rebecca Scott transferred to respite care DME in [place], [place].  Rebecca Scott recently emailed in March 2022 regarding a referral for a dental device.  Rebecca Scott had talked to her dentist about her teeth grinding as well as sleep apnea and they recommended a dental device.  I placed a referral as well.  Rebecca Scott needed a new office visit and repeat sleep test per insurance requirement.  Today, 01/02/2021: Rebecca Scott reports that Rebecca Scott has switched to neurologist dental in [place], [place] as [facility] was not in her network.  Rebecca Scott continues to use her AutoPap machine.  Rebecca Scott was given a bite guard for teeth grinding and TMJ issues but could not use Rebecca bite guard and Rebecca AutoPap machine together.  Rebecca Scott is hoping that Rebecca oral appliance will take care of Rebecca tooth grinding damage and TMJ issues as well as Rebecca sleep apnea. I reviewed her AutoPap compliance data from 12/03/2020 through 01/01/2021, which is a total of 30 days, during which  time Rebecca Scott used her machine every night with percent use days greater than 4 hours at 97%, indicating excellent compliance with an average usage of 8 hours and 21 minutes, residual AHI at goal at 0.4/h, 95th percentile of pressure at 10.4 cm, range of 7 to 12 cm.  Leak on Rebecca low side with a 95th percentile at 6.4 L/min.  Rebecca Scott has regular follow-up with her endocrinologist for ultrasound.  Rebecca Scott gets a yearly PET scan for screening purposes due to her papillary thyroid cancer.  Rebecca Scott sees Rebecca oncologist for her kidney cancer Scott now once a year.  Rebecca Scott had partial right nephrectomy and did not need any subsequent chemo or radiation.   Rebecca Scott, Rebecca Scott, Rebecca Scott, Rebecca Scott, Rebecca Scott, Rebecca Scott were reviewed and updated as appropriate.    Previously:    Rebecca Scott missed a VV on 05/23/20. I saw her for an office visit on 05/20/2019, at which time Rebecca Scott had established treatment with AutoPap after a home sleep test on 02/11/2019 reestablished her diagnosis of sleep apnea in Rebecca moderate range, AHI was 15.5/h, O2 nadir 91%.  Rebecca Scott had been on CPAP therapy before that for years.  Rebecca Scott was compliant with treatment and advised to follow-up routinely in 1 year.  Rebecca Scott had recently moved to [facility] and we had discussed Rebecca possibility of doing a virtual visit.  I reviewed her AutoPap compliance data from 05/28/2020 through 06/26/2020, which is a total of 30 days, during  which time Rebecca Scott used her machine 27 days with percent use days greater than 4 hours at 90%, indicating excellent compliance with an average usage of 8 hours and 19 minutes, residual AHI at goal at 0.6/h, 95th percentile of pressure at 10.4 cm with a range of 7 to 11 cm, leak on Rebecca lower end of Rebecca spectrum with Rebecca 95th percentile at 6.2 L/min.  Rebecca Scott does have a backup machine, her old CPAP when Rebecca Scott is out of town and Rebecca Scott was visiting a friend over Rebecca weekend in late September which explains  Rebecca 3 days of no usage on this machine.     I first met her on 01/06/2019 and a virtual visit at Rebecca request of her primary care physician, at which time Rebecca Scott reported a prior diagnosis of obstructive sleep apnea.  Rebecca Scott was on CPAP of 8 cm via nasal pillows at Rebecca time.  Rebecca Scott was on her second machine at Rebecca time and it was about 64 years old.  Rebecca Scott was advised to proceed with reevaluation in Rebecca form of home sleep testing and was advised that Rebecca Scott was likely eligible for a new machine.  Rebecca Scott had a home sleep test on 02/11/2019 which indicated moderate obstructive sleep apnea by a number of events with an AHI of 15.5/h, O2 nadir of 91%.  Rebecca Scott was advised to proceed with AutoPap therapy in Rebecca midst of Rebecca COVID-19 pandemic.   I reviewed her AutoPap compliance data from 04/19/2019 through 05/18/2019 which is a total of 30 days, during which time Rebecca Scott used her machine 24 days with percent use days greater than 4 hours at 80%, indicating very good compliance with an average usage of 9 hours and 58 minutes, residual AHI at goal at 0.3/h, leak on Rebecca low side with a 95th percentile at 4.2 L/min, pressure range of 7 cm to 11 cm, 95th percentile of pressure at 10.6 cm, EPR of 1.   01/06/2019: I am conducting a virtual, video based new patient visit via Webex in lieu of a face-to-face visit for evaluation of her sleep disorder, in particular, reevaluation of her prior diagnosis of obstructive sleep apnea. Rebecca patient is unaccompanied today and joins via cell phone from home. Rebecca Scott is referred by Dr. Ronnie Doss and I reviewed her phone visit note from 12/22/18. Rebecca Scott reports difficulty maintaining sleep, nonrestorative sleep, some residual sleep apnea type symptoms. Rebecca Scott reports compliance with her CPAP. Her Rebecca machine is set to a pressure of 8 cm, Rebecca Scott uses nasal pillows, DME company is [employer] in [place]. Rebecca Scott received a new machine in 2015, her model is ResMed escape auto.  Rebecca Scott was diagnosed with obstructive sleep apnea  nearly 15 years ago, in 2006 and Rebecca Scott has been on CPAP therapy. Her machine is about 64 years old, Rebecca Scott is on her second machine. Prior sleep study results were reviewed from 05/21/2005. Rebecca Scott had a split-night sleep study at Rebecca time. Her baseline RDI was 32 per hour, O2 nadir 82%. Rebecca Scott had good control with a CPAP of 8 cm. A CPAP compliance download is not possible today. Her Epworth sleepiness score is 11 out of 24, fatigue severity score is 51 out of 63. Her bedtime is between 10 and 11 and rise time between 8 and 10. Rebecca Scott does not have night to night nocturia. Rebecca Scott gained weight after her cancer surgeries. Rebecca Scott had a partial right nephrectomy and a thyroidectomy. Rebecca Scott was diagnosed with asthma some years ago and was also told that  Rebecca Scott had reduced tonsils as in a naturally shrunk away. Rebecca Scott did not actually have a tonsillectomy. Rebecca Scott is not aware of any Rebecca Scott of OSA. Rebecca Scott lives alone, is widowed, has adult children. Rebecca Scott has no pets in Rebecca household. Rebecca Scott drinks caffeine limitation, 1 cup of coffee per day on average, Rebecca Scott is a nonsmoker does not currently utilize any alcohol. Rebecca Scott has a TV in Rebecca bedroom but does not typically watch it at night. Rebecca Scott reports full compliance with her CPAP but wonders if Rebecca settings need to be changed. Rebecca Scott is on disability, previously worked as an Product/process development scientist.    Her Rebecca Scott Is Significant For: Rebecca Scott:  Diagnosis Date  . Anemia   . Anxiety   . Arthritis   . Asthma    seasonal asthma rarely   . Chronic kidney disease    renal mass right kidney   . CPAP (continuous positive airway pressure) dependence   . Diverticulitis   . 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 Rebecca Scott of colon cancer   . Rebecca Scott of kidney cancer   . Rebecca Scott of melanoma   . GERD (gastroesophageal reflux disease)   . Hemorrhoids   . Scott of kidney stones   . HTN (hypertension) 07/29/2014  . Hyperlipemia    . Hypertension   . Migraine    no migraines now, will have stress headaches  . Multinodular goiter 08/21/2017   Last Assessment & Plan:  Rebecca Scott will need an f/u u/s of thyroid to reassess nodules. Repeat in 07/2018 Nodules are too small for biopsy now.  . Papillary renal cell carcinoma ([facility]) 05/2017   s/p partial nephrectomy, right  . Papillary thyroid carcinoma ([employer]) 03/10/2018  . PONV (postoperative nausea and vomiting)    patient reports being "slower to wake than average "   . Pre-diabetes    "ive been told i'm borderline"   . Sleep apnea    CPAP use     Her Rebecca Surgical Scott Is Significant For: Rebecca Surgical Scott:  Procedure Laterality Date  . ABDOMINAL HYSTERECTOMY    . APPENDECTOMY  1978  . BIOPSY  07/08/2017   Procedure: BIOPSY;  Surgeon: Rogene Houston, MD;  [facility];  Service: Endoscopy;;  gastric  . CHOLECYSTECTOMY  2005  . COLONOSCOPY WITH PROPOFOL N/A 10/28/2017   Procedure: COLONOSCOPY WITH PROPOFOL;  Surgeon: Rogene Houston, MD;  Location: [facility];  Service: Endoscopy;  Laterality: N/A;  7:30  . ESOPHAGOGASTRODUODENOSCOPY N/A 07/08/2017   Procedure: ESOPHAGOGASTRODUODENOSCOPY (EGD);  Surgeon: Rogene Houston, MD;  [facility];  Service: Endoscopy;  Laterality: N/A;  220  . LUMBAR LAMINECTOMY/DECOMPRESSION MICRODISCECTOMY N/A 09/26/2018   Procedure: L4-5 MICRODISCECTOMY;  Surgeon: [name], MD;  Location: [facility];  Service: Orthopedics;  Laterality: N/A;  . NASAL SEPTUM SURGERY    . ROBOTIC ASSITED PARTIAL NEPHRECTOMY Right 05/17/2017   Procedure: XI ROBOTIC ASSITED PARTIAL NEPHRECTOMY;  Surgeon: Alexis Frock, MD;  Location: [facility];  Service: Urology;  Laterality: Right;  . Ruptured Disk L5    . THYROIDECTOMY N/A 03/10/2018   Procedure: TOTAL THYROIDECTOMY;  Surgeon: Aviva Signs, MD;  Location: [facility];  Service: General;  Laterality: N/A;    Her Rebecca Scott Is Significant For: Rebecca Scott  Problem Relation Age of  Onset  . COPD Mother   . Diabetes Mother   . Hypertension Mother   . High  Cholesterol Mother   . Liver cancer Mother        bile duct cancer  . COPD Sister   . Hypertension Brother   . High Cholesterol Brother   . Kidney cancer Brother 59  . Hypertension Brother   . Asthma Son   . Congenital heart disease Son        hypoplastic left heart syndrome  . Melanoma Maternal Aunt        dx in her 84s  . Brain cancer Maternal Grandmother        ? benign  . Heart disease Maternal Grandfather   . Colon cancer Other   . Lung disease Maternal Uncle   . Cancer Cousin        ? lunc cancer; mother's mat first cousin    Her Social Scott Is Significant For: Social Scott   Socioeconomic Scott  . Marital status: Widowed    Spouse name: Not on file  . Number of children: Not on file  . Years of education: Not on file  . Highest education level: Not on file  Occupational Scott  . Not on file  Tobacco Use  . Smoking status: Never Smoker  . Smokeless tobacco: Never Used  Vaping Use  . Vaping Use: Never used  Substance and Sexual Activity  . Alcohol use: Yes    Comment: rarely   . Drug use: No  . Sexual activity: Not Currently  Other Topics Concern  . Not on file  Social Scott Narrative  . Not on file   Social Determinants of Health   Financial Resource Strain: Not on file  Food Insecurity: Not on file  Transportation Needs: Not on file  Physical Activity: Not on file  Stress: Not on file  Social Connections: Not on file    Her Scott Are:  Scott  Allergen Reactions  . Iodinated Diagnostic Agents Hives and Itching  . Mango Flavor Swelling and Rash    Tongue swelling, feels like throat is closing in on her   . Isovue [Iopamidol] Hives and Itching    And hives   . Nitrofurantoin Hives  . Other Hives, Itching, Rash and Other (See Comments)    Use Paper tape ONLY  . Sulfa Antibiotics Hives  . Adhesive [Tape] Rash and Other (See Comments)    Use Paper  tape ONLY  :   Her Rebecca Scott Are:  Outpatient Encounter Scott as of 01/02/2021  Medication Sig  . diphenhydrAMINE (BENADRYL) 50 MG tablet Take 1 tablet (50 mg total) by mouth once for 1 dose. 1 hour prior to CT  . famotidine (PEPCID) 20 MG tablet Take 20 mg by mouth at bedtime. As needed  . hydrochlorothiazide (HYDRODIURIL) 25 MG tablet TAKE 1/2 TABLET BY MOUTH DAILY (Patient taking differently: Take 12 mg by mouth daily.)  . HYDROcodone-acetaminophen (NORCO/VICODIN) 5-325 MG tablet Take 1 tablet by mouth every 6 (six) hours as needed for moderate pain. (Patient not taking: Reported on 11/04/2019)  . levothyroxine (SYNTHROID) 100 MCG tablet Take 1 tablet (100 mcg total) by mouth daily before breakfast.  . losartan (COZAAR) 100 MG tablet TAKE 1 TABLET ONCE DAILY.  . methocarbamol (ROBAXIN) 500 MG tablet Take 1 tablet (500 mg total) by mouth every 8 (eight) hours as needed for muscle spasms.  . metroNIDAZOLE (METROGEL) 0.75 % gel Apply topically.  . nortriptyline (PAMELOR) 10 MG capsule TAKE 1-4 TABLETS NIGHTLY AS INSTRUCTED  . potassium chloride SA (K-DUR,KLOR-CON) 20 MEQ tablet TAKE 1 TABLET TWICE  DAILY FOR 3 DAYS THEN 1 TABLET DAILY.  Marland Kitchen predniSONE (DELTASONE) 50 MG tablet Take 1 tablet 13 hrs, 7 hrs and 1 hr prior to CT  . pregabalin (LYRICA) 50 MG capsule Take 50 mg by mouth 2 (two) times daily.  . simvastatin (ZOCOR) 40 MG tablet Take 40 mg by mouth daily at 6 PM.    No facility-administered encounter Scott on file as of 01/02/2021.  :  Review of Systems:  Out of a complete 14 point review of systems, all are reviewed and negative with Rebecca exception of these symptoms as listed below:  Virtual Visit via Phone Note on 01/02/2021:   I connected with [place] on 01/02/21 at  7:30 AM EDT by phone and verified that I am speaking with Rebecca correct person using two identifiers.   I discussed Rebecca limitations of evaluation and management by telemedicine and Rebecca availability of  in person appointments. Rebecca patient expressed understanding and agreed to proceed.  Scott of Present Illness: See above.   Observations/Objective: Rebecca Scott is pleasant, conversant, no acute distress, slightly raspy voice, no dysarthria, no shortness of breath, can talk in full sentences.  Assessment and Plan: Rebecca Scott is a 65 year old right-handed woman with an underlying medical Scott of prediabetes, papillary thyroid cancer, kidney cancer, migraine headaches, hypertension, hyperlipidemia, Scott of kidney stones, reflux disease, diverticulitis, asthma, arthritis, anemia, anxiety, and obesity,who presents for follow-up consultation of her obstructive sleep apnea, via phone call visit. Rebecca Scott had been on a CPAP machine for many years, Rebecca Scott had an older machine and needed reevaluation. Her home sleep test from 02/11/2019 showed an AHI of 15.5/h, O2 nadir of 91%. Rebecca Scott has been on autoPap and is compliant with treatment.  Rebecca Scott continues to stay compliant.  Unfortunately, Rebecca Scott has developed significant bruxism and TMJ issues.  Rebecca Scott is in Rebecca process of seeking treatment through a oral appliance which will also help her sleep apnea.  Rebecca Scott tried an oral occlusive guard for her bruxism and TMJ issues but could not use Rebecca occlusive guard and Rebecca AutoPap together.  Rebecca Scott is commended on her treatment adherence.  Rebecca Scott is advised to continue with her AutoPap treatment until Rebecca Scott has been fitted with an oral appliance.  We will update her sleep apnea diagnosis with a home sleep test as this is an insurance requirement, as patient found out. Rebecca Scott has moved to [facility], and has established with a local DME company.  Rebecca Scott may benefit from transferring to a local sleep specialist as well.  Rebecca Scott reports that Rebecca Scott is in Rebecca process of pursuing this as well.  We will pursue a home sleep test, we will call her with her results and also keep her dentist posted so they can start Rebecca process for Rebecca oral appliance.  Rebecca Scott is advised to  follow-up in this office as needed, if Rebecca Scott has not found a sleep specialist in 1 year, Rebecca Scott can certainly follow-up here as well.  I answered all her questions today and Rebecca Scott was in agreement.  Follow Up Instructions:  FU 1 year or prn.   I discussed Rebecca assessment and treatment plan with Rebecca patient. Rebecca patient was provided an opportunity to ask questions and all were answered. Rebecca patient agreed with Rebecca plan and demonstrated an understanding of Rebecca instructions.   Rebecca patient was advised to call back or seek an in-person evaluation if Rebecca symptoms worsen or if Rebecca condition fails to improve as anticipated.  I provided 14 minutes of non-face-to-face time  during this encounter.   Star Age, MD

## 2021-01-02 NOTE — Patient Instructions (Signed)
Verbal instructions given

## 2021-01-18 ENCOUNTER — Other Ambulatory Visit: Payer: Self-pay

## 2021-01-18 ENCOUNTER — Ambulatory Visit (INDEPENDENT_AMBULATORY_CARE_PROVIDER_SITE_OTHER): Payer: Medicare Other | Admitting: Neurology

## 2021-01-18 DIAGNOSIS — M26629 Arthralgia of temporomandibular joint, unspecified side: Secondary | ICD-10-CM

## 2021-01-18 DIAGNOSIS — F458 Other somatoform disorders: Secondary | ICD-10-CM

## 2021-01-18 DIAGNOSIS — G4733 Obstructive sleep apnea (adult) (pediatric): Secondary | ICD-10-CM

## 2021-01-23 NOTE — Progress Notes (Signed)
See procedure note.

## 2021-01-25 ENCOUNTER — Ambulatory Visit
Admission: RE | Admit: 2021-01-25 | Discharge: 2021-01-25 | Disposition: A | Discharge status: Home / Self Care | Payer: Medicare Other | Source: Ambulatory Visit | Attending: "Endocrinology | Admitting: "Endocrinology

## 2021-01-25 ENCOUNTER — Other Ambulatory Visit: Payer: Self-pay

## 2021-01-25 DIAGNOSIS — C73 Malignant neoplasm of thyroid gland: Secondary | ICD-10-CM | POA: Diagnosis present

## 2021-01-25 DIAGNOSIS — E890 Postprocedural hypothyroidism: Secondary | ICD-10-CM | POA: Diagnosis not present

## 2021-01-25 NOTE — Progress Notes (Signed)
Patient has been on autoPAP, we had a Ph appt and she was seeking treatment with an oral appl through dentistry. She needed an updated sleep test, had HST on 01/19/21.    Please call and notify the patient that the recent home sleep test indicated mild obstructive sleep apnea with a total AHI of 6.1/hour and O2 nadir of 88%. Snoring was noted fairly consistently, ranging from mild to loud.  I recommend, she FU with her dentist to discuss her oral appl.  At this juncture, she can FU with [place] prn or in 1 year, if she still is on autoPAP for OSA.  Star Age, MD, PhD [facility])

## 2021-01-25 NOTE — Procedures (Signed)
[facility] ([facility])  STUDY DATE: 01/19/21  DOB: 09-Dec-1955  MRN: [id number]  ORDERING CLINICIAN: Star Age, MD, PhD   REFERRING CLINICIAN: Henderson Baltimore, MD   CLINICAL INFORMATION/HISTORY: 65 year old woman with a history of prediabetes, papillary thyroid cancer, kidney cancer, migraine headaches, hypertension, hyperlipidemia, history of kidney stones, reflux disease, diverticulitis, asthma, arthritis, anemia, anxiety, and obesity, who presents for re-evaluation of her obstructive sleep apnea. She has been compliant with autoPAP, but has had TMJ issue and is seeking treatment with an oral appliance through her dentist.   Epworth sleepiness score: 4/24.  BMI: 42.9 kg/m  Neck Circumference: 18"  FINDINGS:   Total Record Time (hours, min): 8 H 55 min  Total Sleep Time (hours, min):  8 H 21 min   Percent REM (%):    25.31 %   Calculated pAHI (per hour): 6.1      REM pAHI: 5.7   NREM pAHI: 6.3 Supine AHI: 9.4   Oxygen Saturation (%) Mean: 93  Minimum oxygen saturation (%):        88   O2 Saturation Range (%): 88-98  O2Saturation (minutes) <=88%: 0 min  Pulse Mean (bpm):    56  Pulse Range (56-88)   IMPRESSION: OSA (obstructive sleep apnea), mild  RECOMMENDATION:  This home sleep test demonstrates overall mild obstructive sleep apnea with a total AHI of 6.1/hour and O2 nadir of 88%. Snoring was noted fairly consistently, ranging from mild to loud. Given the patient's medical history and sleep related complaints, ongoing treatment is recommended. The patient will be advised to follow up with her dentist as she has considered an oral appliance.    Please note that untreated obstructive sleep apnea may carry additional perioperative morbidity. Patients with significant obstructive sleep apnea should receive perioperative PAP therapy and the surgeons and particularly the anesthesiologist should be informed of the diagnosis and the severity of  the sleep disordered breathing. The patient should be cautioned not to drive, work at heights, or operate dangerous or heavy equipment when tired or sleepy. Review and reiteration of good sleep hygiene measures should be pursued with any patient. Other causes of the patient's symptoms, including circadian rhythm disturbances, an underlying mood disorder, medication effect and/or an underlying medical problem cannot be ruled out based on this test. Clinical correlation is recommended.   The patient will be notified of the test results. The patient will be seen in follow up in sleep clinic at [facility] as necessary.  I certify that I have reviewed the raw data recording prior to the issuance of this report in accordance with the standards of the American Academy of Sleep Medicine (AASM).  INTERPRETING PHYSICIAN:  Star Age, MD      Sleep Summary  Oxygen Saturation Statistics   Start Study Time: End Study Time: Total Recording Time:         12:09:19 AM 9:04:40 AM 8 h, 55 min  Total Sleep Time % REM of Sleep Time:  8 h, 21 min  25.3    Mean: 93 Minimum: 88 Maximum: 98  Mean of Desaturations Nadirs (%):   91  Oxygen Desatur. %: 4-9 10-20 >20 Total  Events Number Total  12 100.0  0 0.0  0 0.0  12 100.0  Oxygen Saturation: <90 <=88 <85 <80 <70  Duration (minutes): Sleep % 0.0 0.0 0.0 0.0 0.0 0.0 0.0 0.0 0.0 0.0     Respiratory Indices      Total  Events REM NREM All Night  pRDI: pAHI 3%: ODI 4%: pAHIc 3%: % CSR: pAHI 4%:  99  51  12  3 0.0 12 10.4 5.7 1.4 0.0 12.4 6.3 1.4 0.5 11.9 6.1 1.4 0.4 1.4       Pulse Rate Statistics during Sleep (BPM)      Mean: 56 Minimum: N/A Maximum: 88          Body Position Statistics  Position Supine Prone Right Left Non-Supine  Sleep (min) 218.5 50.0 165.5 67.9 283.4  Sleep % 43.5 10.0 33.0 13.5 56.5  pRDI 16.8 4.8 7.3 12.4 8.1  pAHI 3% 9.4 2.4 4.4 2.7 3.6  ODI 4% 2.5 1.2 0.7 0.0 0.6     Snoring  Statistics Snoring Level (dB) >40 >50 >60 >70 >80 >Threshold (45)  Sleep (min) 489.2 173.6 54.3 0.0 0.0 245.7  Sleep % 97.5 34.6 10.8 0.0 0.0 49.0

## 2021-02-02 ENCOUNTER — Other Ambulatory Visit: Payer: Self-pay | Admitting: Neurology

## 2021-02-02 ENCOUNTER — Encounter: Payer: Self-pay | Admitting: Neurology

## 2021-02-02 DIAGNOSIS — Z9989 Dependence on other enabling machines and devices: Secondary | ICD-10-CM

## 2021-02-02 DIAGNOSIS — G4733 Obstructive sleep apnea (adult) (pediatric): Secondary | ICD-10-CM

## 2021-02-02 LAB — THYROGLOBULIN ANTIBODY: Thyroglobulin Ab: <1 IU/mL (ref <=1)

## 2021-02-02 LAB — THYROGLOBULIN LEVEL: Thyroglobulin: 0.1 ng/mL — ABNORMAL LOW

## 2021-02-02 LAB — T4, FREE: Free T4: 1 ng/dL (ref 0.8–1.8)

## 2021-02-02 LAB — TSH: TSH: 5.97 mIU/L — ABNORMAL HIGH (ref 0.40–4.50)

## 2021-02-02 NOTE — Telephone Encounter (Signed)
Faxed referral to [facility]. Phone: [phone] Fax: [phone]

## 2021-02-08 ENCOUNTER — Telehealth: Payer: Self-pay | Admitting: Neurology

## 2021-02-08 DIAGNOSIS — G4733 Obstructive sleep apnea (adult) (pediatric): Secondary | ICD-10-CM

## 2021-02-08 NOTE — Telephone Encounter (Signed)
rx has been sent to [facility]

## 2021-02-08 NOTE — Telephone Encounter (Signed)
Rx for oral appl signed, as requested.

## 2021-02-08 NOTE — Telephone Encounter (Signed)
[facility]) called, need prescription for custom oral sleep appliance and prescription code is [phone].  Contact info: [phone] Fax: [phone]

## 2021-02-13 ENCOUNTER — Encounter: Payer: Self-pay | Admitting: "Endocrinology

## 2021-02-13 ENCOUNTER — Ambulatory Visit: Payer: Medicare Other | Admitting: "Endocrinology

## 2021-02-13 ENCOUNTER — Other Ambulatory Visit: Payer: Self-pay

## 2021-02-13 VITALS — BP 108/74 | HR 64 | Ht 63.0 in | Wt 207.2 lb

## 2021-02-13 DIAGNOSIS — C73 Malignant neoplasm of thyroid gland: Secondary | ICD-10-CM

## 2021-02-13 DIAGNOSIS — E890 Postprocedural hypothyroidism: Secondary | ICD-10-CM | POA: Diagnosis not present

## 2021-02-13 MED ORDER — LEVOTHYROXINE SODIUM 112 MCG PO TABS: 112.0000 ug | ORAL_TABLET | Freq: Every day | ORAL | 1 refills | Status: DC

## 2021-02-13 NOTE — Progress Notes (Signed)
Endocrinology follow-up note                                            02/13/2021, 2:56 PM   Subjective:    Patient ID: Rebecca Scott, female    DOB: July 19, 1956, PCP Rebecca Baltimore, MD   Past Medical History:  Diagnosis Date  . Anemia   . Anxiety   . Arthritis   . Asthma    seasonal asthma rarely   . Chronic kidney disease    renal mass right kidney   . CPAP (continuous positive airway pressure) dependence   . Diverticulitis   . Family history of cancer of extrahepatic bile ducts   . Family history of colon cancer   . Family history of kidney cancer   . Family history of melanoma   . GERD (gastroesophageal reflux disease)   . Hemorrhoids   . History of kidney stones   . HTN (hypertension) 07/29/2014  . Hyperlipemia   . Hypertension   . Migraine    no migraines now, will have stress headaches  . Multinodular goiter 08/21/2017   Last Assessment & Plan:  She will need an f/u u/s of thyroid to reassess nodules. Repeat in 07/2018 Nodules are too small for biopsy now.  . Papillary renal cell carcinoma ([place]) 05/2017   s/p partial nephrectomy, right  . Papillary thyroid carcinoma (Rebecca Scott) 03/10/2018  . PONV (postoperative nausea and vomiting)    patient reports being "slower to wake than average "   . Pre-diabetes    "ive been told i'm borderline"   . Sleep apnea    CPAP use    Past Surgical History:  Procedure Laterality Date  . ABDOMINAL HYSTERECTOMY    . APPENDECTOMY  1978  . BIOPSY  07/08/2017   Procedure: BIOPSY;  Surgeon: Rebecca Houston, MD;  Location: [facility];  Service: Endoscopy;;  gastric  . CHOLECYSTECTOMY  2005  . COLONOSCOPY WITH PROPOFOL N/A 10/28/2017   Procedure: COLONOSCOPY WITH PROPOFOL;  Surgeon: Rebecca Houston, MD;  Location: [facility];  Service: Endoscopy;  Laterality: N/A;  7:30  . ESOPHAGOGASTRODUODENOSCOPY N/A 07/08/2017   Procedure: ESOPHAGOGASTRODUODENOSCOPY (EGD);  Surgeon: Rebecca Houston, MD;  Location: [facility];   Service: Endoscopy;  Laterality: N/A;  220  . LUMBAR LAMINECTOMY/DECOMPRESSION MICRODISCECTOMY N/A 09/26/2018   Procedure: L4-5 MICRODISCECTOMY;  Surgeon: Rebecca Killings, MD;  Location: [facility];  Service: Orthopedics;  Laterality: N/A;  . NASAL SEPTUM SURGERY    . ROBOTIC ASSITED PARTIAL NEPHRECTOMY Right 05/17/2017   Procedure: XI ROBOTIC ASSITED PARTIAL NEPHRECTOMY;  Surgeon: Rebecca Frock, MD;  Location: [facility];  Service: Urology;  Laterality: Right;  . Ruptured Disk L5    . THYROIDECTOMY N/A 03/10/2018   Procedure: TOTAL THYROIDECTOMY;  Surgeon: Rebecca Signs, MD;  Location: [facility];  Service: General;  Laterality: N/A;   Social History   Socioeconomic History  . Marital status: Widowed    Spouse name: Not on file  . Number of children: Not on file  . Years of education: Not on file  . Highest education level: Not on file  Occupational History  . Not on file  Tobacco Use  . Smoking status: Never Smoker  . Smokeless tobacco: Never Used  Vaping Use  . Vaping Use: Never used  Substance and Sexual Activity  .  Alcohol use: Yes    Comment: rarely   . Drug use: No  . Sexual activity: Not Currently  Other Topics Concern  . Not on file  Social History Narrative  . Not on file   Social Determinants of Health   Financial Resource Strain: Not on file  Food Insecurity: Not on file  Transportation Needs: Not on file  Physical Activity: Not on file  Stress: Not on file  Social Connections: Not on file   Outpatient Encounter Medications as of 02/13/2021  Medication Sig  . diphenhydrAMINE (BENADRYL) 50 MG tablet Take 1 tablet (50 mg total) by mouth once for 1 dose. 1 hour prior to CT  . famotidine (PEPCID) 20 MG tablet Take 20 mg by mouth at bedtime. As needed  . hydrochlorothiazide (HYDRODIURIL) 25 MG tablet TAKE 1/2 TABLET BY MOUTH DAILY (Patient taking differently: Take 12 mg by mouth daily.)  . HYDROcodone-acetaminophen (NORCO/VICODIN) 5-325 MG tablet Take 1 tablet by mouth every 6  (six) hours as needed for moderate pain. (Patient not taking: Reported on 11/04/2019)  . levothyroxine (SYNTHROID) 112 MCG tablet Take 1 tablet (112 mcg total) by mouth daily before breakfast.  . losartan (COZAAR) 100 MG tablet TAKE 1 TABLET ONCE DAILY.  . methocarbamol (ROBAXIN) 500 MG tablet Take 1 tablet (500 mg total) by mouth every 8 (eight) hours as needed for muscle spasms.  . metroNIDAZOLE (METROGEL) 0.75 % gel Apply topically.  . potassium chloride SA (K-DUR,KLOR-CON) 20 MEQ tablet TAKE 1 TABLET TWICE DAILY FOR 3 DAYS THEN 1 TABLET DAILY.  Marland Kitchen predniSONE (DELTASONE) 50 MG tablet Take 1 tablet 13 hrs, 7 hrs and 1 hr prior to CT  . pregabalin (LYRICA) 50 MG capsule Take 50 mg by mouth 2 (two) times daily.  . simvastatin (ZOCOR) 40 MG tablet Take 40 mg by mouth daily at 6 PM.   . [DISCONTINUED] levothyroxine (SYNTHROID) 100 MCG tablet Take 1 tablet (100 mcg total) by mouth daily before breakfast.  . [DISCONTINUED] nortriptyline (PAMELOR) 10 MG capsule TAKE 1-4 TABLETS NIGHTLY AS INSTRUCTED   No facility-administered encounter medications on file as of 02/13/2021.   ALLERGIES: Allergies  Allergen Reactions  . Iodinated Diagnostic Agents Hives and Itching  . Mango Flavor Swelling and Rash    Tongue swelling, feels like throat is closing in on her   . Isovue [Iopamidol] Hives and Itching    And hives   . Nitrofurantoin Hives  . Other Hives, Itching, Rash and Other (See Comments)    Use Paper tape ONLY  . Sulfa Antibiotics Hives  . Adhesive [Tape] Rash and Other (See Comments)    Use Paper tape ONLY    VACCINATION STATUS: Immunization History  Administered Date(s) Administered  . Influenza,inj,Quad PF,6+ Mos 06/06/2018  . Influenza-Unspecified 06/24/2017, 06/11/2019  . Moderna Sars-Covid-2 Vaccination 04/25/2020, 05/30/2020  . Tdap 09/16/2018    HPI Rebecca Scott is 65 y.o. female who is returning for follow-up after recent near total thyroidectomy for thyroid malignancy, and  postsurgical hypothyroidism.    -She underwent near total thyroidectomy on March 10, 2018 which revealed bilateral follicular variant papillary thyroid carcinoma Pathologic Stage Classification (pTNM, AJCC 8th Edition): pT2(multifocal), pNX.  She is currently on Synthroid 100 mcg p.o. daily before breakfast.  she reports compliance.  Her previsit thyroid function tests are consistent with slight under replacement. - She denies dysphagia, odynophagia, voice change, nor shortness of breath.  Her most recent thyroid instrument whole-body scan shows a single focus of increased radiotracer uptake-detailed below. -  Prior to her last visit,  she underwent CT scan of her neck which did not show any definite lesion corresponding to the area of concern on nuclear medicine.  Prior to this visit on September 15, 2020 she underwent thyroid/neck ultrasound: Small teardrop focus of residual thyroid tissue in the left resection.  Measuring 1.2 x 0.8 cm with no evidence of nodularity.  1.2 cm complex cyst within the left submandibular gland with differentials including cyst, focal ductal ectasia, abscess, less likely cystic neoplasm.     On Jan 25, 2021, her previsit thyroid/neck ultrasound showed surgical changes of total thyroidectomy.  Decreasing conspicuity of small focus of probable residual thyroid tissue in the left resection bed with no evidence of thyroid nodule.  Slight interval enlargement of complex cyst within the left submandibular gland measuring 1.5 cm compared to 1.2 cm in the previous measurement.  Given the slight enlargement fine-needle aspiration biopsy was suggested.  Her prior history:  -She was found to have multinodular goiter in November 2018.  At that time she was found to have 2.4 cm nodule in the 5 cm right lobe of thyroid and 1.5 cm nodule in the 4.3 cm left lobe.  Repeat ultrasound in April 2019 showed thyroid remaining more or less the same size however the nodules were observed to grow to  2.7 cm on the right lobe and 1.7 cm in the left lobe.  Subsequently, earlier this month she underwent fine-needle aspiration of the right lobe nodule which showed follicular lesion of undetermined significance.  Molecular studies were not sent.   -Prior to her surgery, patient complained of  foreign body sensation and occasional choking in her neck while eating and breathing problem when she was laying on her back.   -She denies any exposure to neck radiation.  She denies family history of thyroid cancer, however multiple family members with what appears to be hypothyroidism.  She denies heat/cold intolerance.   -Her previsit thyroid/neck ultrasound is consistent with surgical changes with no evidence of residual or recurrent thyroid malignancy. -She is currently on levothyroxine 112 mcg p.o. daily.  Reports good compliance.  Her previsit labs are consistent with slight over replacement.    Review of Systems Limited as above.  Objective:    BP 108/74   Pulse 64   Ht 5\' 3"  (1.6 m)   Wt 207 lb 3.2 oz (94 kg)   BMI 36.70 kg/m   Wt Readings from Last 3 Encounters:  02/13/21 207 lb 3.2 oz (94 kg)  11/15/20 209 lb 3.5 oz (94.9 kg)  09/29/20 209 lb (94.8 kg)     Diabetic Labs (most recent): Lab Results  Component Value Date   HGBA1C 5.1 09/08/2018   HGBA1C 5.4 03/06/2018   HGBA1C 5.7 (H) 05/09/2017     Recent Results (from the past 2160 hour(s))  TSH     Status: Abnormal   Collection Time: 02/01/21  1:55 PM  Result Value Ref Range   TSH 5.97 (H) 0.40 - 4.50 mIU/L  T4, free     Status: None   Collection Time: 02/01/21  1:55 PM  Result Value Ref Range   Free T4 1.0 0.8 - 1.8 ng/dL  Thyroglobulin antibody     Status: None   Collection Time: 02/01/21  1:55 PM  Result Value Ref Range   Thyroglobulin Ab <1 < or = 1 IU/mL  Thyroglobulin Level     Status: Abnormal   Collection Time: 02/01/21  1:55 PM  Result Value Ref  Range   Thyroglobulin 0.1 (L) ng/mL    Comment:        Reference Range:       Intact Thyroid   2.8-40.9       Athyrotic        <0.1 .       Note: Abnormal flagging is based       on the reference interval for        patients with intact thyroid. . . This test was performed using the Beckman Coulter  chemiluminescent method. Values obtained from different assay methods cannot be used interchangeably. Thyroglobulin levels, regardless of value, should not be interpreted as absolute evidence of the presence or absence of disease. .    Comment      Comment: . Thyroglobulin antibodies (TGAB) interfere with thyroglobulin (TG) assays; therefore, TGAB assay should always be performed in conjunction with a TG assay. . . For additional information, please refer to  http://education.questdiagnostics.com/faq/FAQ202  (This link is being provided for informational/ educational purposes only.) .        Diagnosis 1. Thyroid, lobectomy, left lobe and isthmus - PAPILLARY THYROID CARCINOMA, FOLLICULAR VARIANT, 1.3 CM. - TUMOR CONFINED WITHIN THYROID CAPSULE. - MARGINS NOT INVOLVED. 2. Thyroid, lobectomy, right lobe - PAPILLARY THYROID CARCINOMA, FOLLICULAR VARIANT, 2.6 CM. - TUMOR FOCALLY LESS THAN 0.1 CM FROM MARGIN.   Pathologic Stage Classification (pTNM, AJCC 8th Edition): pT2(multifocal), pNX.   Thyrogen stimulated whole-body scan on June 23, 2018 FINDINGS: Radio iodine accumulation is seen at the thyroid bed bilaterally consistent with thyroid remnant. No distant sites of abnormal radio iodine accumulation are identified to suggest iodine-avid metastatic thyroid cancer.  IMPRESSION: Thyroid remnant.  No scintigraphic evidence of iodine-avid thyroid cancer metastases.  Thyroid/neck ultrasound for Jan 13, 2019- consistent with surgical changes with absence of residual or recurrent thyroid disease.    Most recent Thyrogen stimulated whole-body scan on Feb 01, 2020 FINDINGS: A single small focus of increased radiotracer  uptake within the lower left neck.  Normal physiologic uptake identified within the oropharynx and GI tract.  IMPRESSION: Single, small focus of increased radiotracer uptake noted within the left lower lobe neck worrisome for residual/recurrent functioning thyroid tissue.   CT scan of neck on March 25, 2020: IMPRESSION: No mass or adenopathy. No definite finding corresponding to area of increased uptake on nuclear study.  Thyroid/neck ultrasound on September 15, 2020 IMPRESSION: 1. Surgical changes of prior thyroid resection. 2. Small teardrop shaped focus of residual thyroid tissue in the left resection bed measuring 0.8 x 1.2 x 0.6 cm. No evidence of thyroid nodule. 3. Complex cyst within the left submandibular gland measures up to 1.2 x 0.8 x 0.8 cm. Differential considerations include cyst, focal ductal ectasia, abscess, and less likely, cystic neoplasm. Consider repeat left submandibular ultrasound in 3-6 months to assess for Stability.   May, 18 2022 Thyroid/neck surveillance  ultrasound IMPRESSION: 1. Surgical changes of total thyroidectomy. 2. Decreasing conspicuity of small focus of probable residual thyroid tissue in the left resection bed. No evidence of thyroid nodule. 3. Slight interval enlargement of complex cyst within the left submandibular gland now measuring up to 1.5 cm compared to 1.2 cm previously. Given slight interval enlargement, consider fine-needle aspiration biopsy.   Assessment & Plan:   1.  Follicular variant of papillary thyroid cancer  -She is status post negative her thyroidectomy on March 10, 2018 with Dr. Aviva Scott resulting in  Pathologic Stage Classification (pTNM, AJCC 8th Edition): pT2(multifocal), pNX.  -She  he is status post radioactive iodine remnant ablation followed by whole body scan which was completed on June 24, 2018 at [facility].  -The whole body scan revealed some thyroid remnant in the thyroid bed, no  evidence of distant metastasis.  Her previsit thyroid/neck ultrasound is consistent with surgical changes of absent residual or recurrent thyroid disease. -She willd not need any further intervention at this time, -I had a long discussion with her regarding the need for continued monitoring with yearly imaging studies for next 5-10 years.  Her thyroglobulin levels are low at <0.1 with negative thyroglobulin antibodies.  -Her recent Thyrogen stimulated whole-body scan showed single, small focus of increased radiotracer uptake within the left lower lobe worrisome for residual/recurrent functioning thyroid tissue.    -Her previsit CT neck/thyroid was unremarkable-no mass or adenopathy.  No definitive finding corresponding to the area of increased uptake on nuclear study.    September 15, 2020 thyroid/neck ultrasound confirming possible residual thyroid tissue measuring 1.2 cm on the left thyroid bed, and 1.2 cm complex cyst within the left submandibular gland differentials include ductal ectasia, abscess, cyst, or less likely cystic neoplasm.   Per her previsit thyroid/neck ultrasound findings, she will need fine-needle aspiration of the left submandibular lesion.  She is already scheduled to get this [facility] system.  Further action will depend on biopsy findings.      2.  Postsurgical hypothyroidism  -Her previsit thyroid function tests are consistent with under replacement.  She will be back on levothyroxine 112 mcg p.o. daily before breakfast.     - We discussed about the correct intake of her thyroid hormone, on empty stomach at fasting, with water, separated by at least 30 minutes from breakfast and other medications,  and separated by more than 4 hours from calcium, iron, multivitamins, acid reflux medications (PPIs). -Patient is made aware of the fact that thyroid hormone replacement is needed for life, dose to be adjusted by periodic monitoring of thyroid function tests.   - I advised her  to  maintain close follow up with Rebecca Baltimore, MD for primary care needs.    I spent 30 minutes in the care of the patient today including review of labs from Thyroid Function, CMP, and other relevant labs ; imaging/biopsy records (current and previous including abstractions from other facilities); face-to-face time discussing  her lab results and symptoms, medications doses, her options of short and long term treatment based on the latest standards of care / guidelines;   and documenting the encounter.  Rebecca Scott  participated in the discussions, expressed understanding, and voiced agreement with the above plans.  All questions were answered to her satisfaction. she is encouraged to contact clinic should she have any questions or concerns prior to her return visit.    Follow up plan: Return in about 3 months (around 05/16/2021) for F/U with Pre-visit Labs, F/U with Biopsy Results.   Glade Lloyd, MD [facility] [address] [place], [place] [address] Phone: [phone]  Fax: [phone]     02/13/2021, 2:56 PM  This note was partially dictated with voice recognition software. Similar sounding words can be transcribed inadequately or may not  be corrected upon review.

## 2021-05-11 LAB — THYROGLOBULIN LEVEL: Thyroglobulin: <0.1 ng/mL — ABNORMAL LOW

## 2021-05-11 LAB — T4, FREE: Free T4: 1.2 ng/dL (ref 0.8–1.8)

## 2021-05-11 LAB — TSH: TSH: 3.33 mIU/L (ref 0.40–4.50)

## 2021-05-17 ENCOUNTER — Other Ambulatory Visit: Payer: Self-pay

## 2021-05-17 ENCOUNTER — Encounter: Payer: Self-pay | Admitting: "Endocrinology

## 2021-05-17 ENCOUNTER — Ambulatory Visit: Payer: Medicare Other | Admitting: "Endocrinology

## 2021-05-17 VITALS — BP 110/78 | HR 60 | Ht 63.0 in | Wt 209.2 lb

## 2021-05-17 DIAGNOSIS — C73 Malignant neoplasm of thyroid gland: Secondary | ICD-10-CM

## 2021-05-17 DIAGNOSIS — E890 Postprocedural hypothyroidism: Secondary | ICD-10-CM | POA: Diagnosis not present

## 2021-05-17 NOTE — Progress Notes (Signed)
Endocrinology follow-up note                                            05/17/2021, 3:52 PM   Subjective:    Patient ID: Rebecca Scott, female    DOB: 1956/04/29, PCP Rebecca Baltimore, MD   Past Medical History:  Diagnosis Date   Anemia    Anxiety    Arthritis    Asthma    seasonal asthma rarely    Chronic kidney disease    renal mass right kidney    CPAP (continuous positive airway pressure) dependence    Diverticulitis    Family history of cancer of extrahepatic bile ducts    Family history of colon cancer    Family history of kidney cancer    Family history of melanoma    GERD (gastroesophageal reflux disease)    Hemorrhoids    History of kidney stones    HTN (hypertension) 07/29/2014   Hyperlipemia    Hypertension    Migraine    no migraines now, will have stress headaches   Multinodular goiter 08/21/2017   Last Assessment & Plan:  She will need an f/u u/s of thyroid to reassess nodules. Repeat in 07/2018 Nodules are too small for biopsy now.   Papillary renal cell carcinoma (Rebecca Scott) 05/2017   s/p partial nephrectomy, right   Papillary thyroid carcinoma (Rebecca Scott) 03/10/2018   PONV (postoperative nausea and vomiting)    patient reports being "slower to wake than average "    Pre-diabetes    "ive been told i'm borderline"    Sleep apnea    CPAP use    Past Surgical History:  Procedure Laterality Date   ABDOMINAL HYSTERECTOMY     APPENDECTOMY  1978   BIOPSY  07/08/2017   Procedure: BIOPSY;  Surgeon: Rebecca Houston, MD;  Location: [facility];  Service: Endoscopy;;  gastric   CHOLECYSTECTOMY  2005   COLONOSCOPY WITH PROPOFOL N/A 10/28/2017   Procedure: COLONOSCOPY WITH PROPOFOL;  Surgeon: Rebecca Houston, MD;  Location: [facility];  Service: Endoscopy;  Laterality: N/A;  7:30   ESOPHAGOGASTRODUODENOSCOPY N/A 07/08/2017   Procedure: ESOPHAGOGASTRODUODENOSCOPY (EGD);  Surgeon: Rebecca Houston, MD;  Location: [facility];  Service: Endoscopy;   Laterality: N/A;  220   LUMBAR LAMINECTOMY/DECOMPRESSION MICRODISCECTOMY N/A 09/26/2018   Procedure: L4-5 MICRODISCECTOMY;  Surgeon: Rebecca Killings, MD;  Location: [facility];  Service: Orthopedics;  Laterality: N/A;   NASAL SEPTUM SURGERY     ROBOTIC ASSITED PARTIAL NEPHRECTOMY Right 05/17/2017   Procedure: XI ROBOTIC ASSITED PARTIAL NEPHRECTOMY;  Surgeon: Rebecca Frock, MD;  Location: [facility];  Service: Urology;  Laterality: Right;   Ruptured Disk L5     THYROIDECTOMY N/A 03/10/2018   Procedure: TOTAL THYROIDECTOMY;  Surgeon: Rebecca Signs, MD;  Location: [facility];  Service: General;  Laterality: N/A;   Social History   Socioeconomic History   Marital status: Widowed    Spouse name: Not on file   Number of children: Not on file   Years of education: Not on file   Highest education level: Not on file  Occupational History   Not on file  Tobacco Use   Smoking status: Never   Smokeless tobacco: Never  Vaping Use   Vaping Use: Never used  Substance and Sexual Activity   Alcohol use:  Yes    Comment: rarely    Drug use: No   Sexual activity: Not Currently  Other Topics Concern   Not on file  Social History Narrative   Not on file   Social Determinants of Health   Financial Resource Strain: Not on file  Food Insecurity: Not on file  Transportation Needs: Not on file  Physical Activity: Not on file  Stress: Not on file  Social Connections: Not on file   Outpatient Encounter Medications as of 05/17/2021  Medication Sig   HYDROcodone-acetaminophen (NORCO/VICODIN) 5-325 MG tablet Take by mouth every 6 (six) hours as needed.   diphenhydrAMINE (BENADRYL) 50 MG tablet Take 1 tablet (50 mg total) by mouth once for 1 dose. 1 hour prior to CT   famotidine (PEPCID) 20 MG tablet Take 20 mg by mouth at bedtime. As needed   hydrochlorothiazide (HYDRODIURIL) 25 MG tablet TAKE 1/2 TABLET BY MOUTH DAILY (Patient taking differently: Take 12 mg by mouth daily.)   levothyroxine (SYNTHROID) 112 MCG tablet  Take 1 tablet (112 mcg total) by mouth daily before breakfast.   losartan (COZAAR) 100 MG tablet TAKE 1 TABLET ONCE DAILY.   methocarbamol (ROBAXIN) 500 MG tablet Take 1 tablet (500 mg total) by mouth every 8 (eight) hours as needed for muscle spasms.   potassium chloride SA (K-DUR,KLOR-CON) 20 MEQ tablet TAKE 1 TABLET TWICE DAILY FOR 3 DAYS THEN 1 TABLET DAILY.   predniSONE (DELTASONE) 50 MG tablet Take 1 tablet 13 hrs, 7 hrs and 1 hr prior to CT   pregabalin (LYRICA) 50 MG capsule Take 50 mg by mouth 2 (two) times daily.   simvastatin (ZOCOR) 40 MG tablet Take 40 mg by mouth daily at 6 PM.    [DISCONTINUED] HYDROcodone-acetaminophen (NORCO/VICODIN) 5-325 MG tablet Take 1 tablet by mouth every 6 (six) hours as needed for moderate pain. (Patient not taking: No sig reported)   No facility-administered encounter medications on file as of 05/17/2021.   ALLERGIES: Allergies  Allergen Reactions   Iodinated Diagnostic Agents Hives and Itching   Mango Flavor Swelling and Rash    Tongue swelling, feels like throat is closing in on her    Isovue [Iopamidol] Hives and Itching    And hives    Nitrofurantoin Hives   Other Hives, Itching, Rash and Other (See Comments)    Use Paper tape ONLY   Sulfa Antibiotics Hives   Adhesive [Tape] Rash and Other (See Comments)    Use Paper tape ONLY    VACCINATION STATUS: Immunization History  Administered Date(s) Administered   Influenza,inj,Quad PF,6+ Mos 06/06/2018   Influenza-Unspecified 06/24/2017, 06/11/2019   Moderna Sars-Covid-2 Vaccination 04/25/2020, 05/30/2020   Tdap 09/16/2018    HPI Rebecca Scott is 65 y.o. female who is returning for follow-up after recent near total thyroidectomy for thyroid malignancy, and postsurgical hypothyroidism.    -She underwent near total thyroidectomy on March 10, 2018 which revealed bilateral follicular variant papillary thyroid carcinoma Pathologic Stage Classification (pTNM, AJCC 8th Edition): pT2(multifocal),  pNX.  She is currently on Synthroid 112 mcg p.o. daily before breakfast.  she reports compliance.  Her previsit thyroid function tests are consistent with  Appropriate replacement.   - She denies dysphagia, odynophagia, voice change, nor shortness of breath.  Prior to this visit on September 15, 2020 she underwent thyroid/neck ultrasound: Small teardrop focus of residual thyroid tissue in the left resection.  Measuring 1.2 x 0.8 cm with no evidence of nodularity.  1.2 cm complex cyst within the  left submandibular gland with differentials including cyst, focal ductal ectasia, abscess, less likely cystic neoplasm.   She underwent fine-needle aspiration biopsy prior to this visit which was negative for malignancy.  However she is scheduled for surgical excision of the cyst at [facility]. -See notes from prior visits.   Review of Systems Limited as above.  Objective:    BP 110/78   Pulse 60   Ht '5\' 3"'$  (1.6 m)   Wt 209 lb 3.2 oz (94.9 kg)   BMI 37.06 kg/m   Wt Readings from Last 3 Encounters:  05/17/21 209 lb 3.2 oz (94.9 kg)  02/13/21 207 lb 3.2 oz (94 kg)  11/15/20 209 lb 3.5 oz (94.9 kg)     Diabetic Labs (most recent): Lab Results  Component Value Date   HGBA1C 5.1 09/08/2018   HGBA1C 5.4 03/06/2018   HGBA1C 5.7 (H) 05/09/2017     Recent Results (from the past 2160 hour(s))  TSH     Status: None   Collection Time: 05/10/21  1:38 PM  Result Value Ref Range   TSH 3.33 0.40 - 4.50 mIU/L  T4, free     Status: None   Collection Time: 05/10/21  1:38 PM  Result Value Ref Range   Free T4 1.2 0.8 - 1.8 ng/dL  Thyroglobulin Level     Status: Abnormal   Collection Time: 05/10/21  1:38 PM  Result Value Ref Range   Thyroglobulin <0.1 (L) ng/mL    Comment:       Reference Range:       Intact Thyroid   2.8-40.9       Athyrotic        <0.1 .       Note: Abnormal flagging is based       on the reference interval for        patients with intact thyroid. . . This test was  performed using the Beckman Coulter  chemiluminescent method. Values obtained from different assay methods cannot be used interchangeably. Thyroglobulin levels, regardless of value, should not be interpreted as absolute evidence of the presence or absence of disease. .    Comment      Comment: . Thyroglobulin antibodies (TGAB) interfere with thyroglobulin (TG) assays; therefore, TGAB assay should always be performed in conjunction with a TG assay. . . For additional information, please refer to  http://education.questdiagnostics.com/faq/FAQ202  (This link is being provided for informational/ educational purposes only.) .        Diagnosis 1. Thyroid, lobectomy, left lobe and isthmus - PAPILLARY THYROID CARCINOMA, FOLLICULAR VARIANT, 1.3 CM. - TUMOR CONFINED WITHIN THYROID CAPSULE. - MARGINS NOT INVOLVED. 2. Thyroid, lobectomy, right lobe - PAPILLARY THYROID CARCINOMA, FOLLICULAR VARIANT, 2.6 CM. - TUMOR FOCALLY LESS THAN 0.1 CM FROM MARGIN.   Pathologic Stage Classification (pTNM, AJCC 8th Edition): pT2(multifocal), pNX.   Thyrogen stimulated whole-body scan on June 23, 2018 FINDINGS: Radio iodine accumulation is seen at the thyroid bed bilaterally consistent with thyroid remnant. No distant sites of abnormal radio iodine accumulation are identified to suggest iodine-avid metastatic thyroid cancer.   IMPRESSION: Thyroid remnant.  No scintigraphic evidence of iodine-avid thyroid cancer metastases.   Thyroid/neck ultrasound for Jan 13, 2019- consistent with surgical changes with absence of residual or recurrent thyroid disease.     Most recent Thyrogen stimulated whole-body scan on Feb 01, 2020 FINDINGS: A single small focus of increased radiotracer uptake within the lower left neck.   Normal physiologic uptake identified within the oropharynx  and GI tract.   IMPRESSION: Single, small focus of increased radiotracer uptake noted within the left lower lobe  neck worrisome for residual/recurrent functioning thyroid tissue.    CT scan of neck on March 25, 2020: IMPRESSION: No mass or adenopathy. No definite finding corresponding to area of increased uptake on nuclear study.  Thyroid/neck ultrasound on September 15, 2020 IMPRESSION: 1. Surgical changes of prior thyroid resection. 2. Small teardrop shaped focus of residual thyroid tissue in the left resection bed measuring 0.8 x 1.2 x 0.6 cm. No evidence of thyroid nodule. 3. Complex cyst within the left submandibular gland measures up to 1.2 x 0.8 x 0.8 cm. Differential considerations include cyst, focal ductal ectasia, abscess, and less likely, cystic neoplasm. Consider repeat left submandibular ultrasound in 3-6 months to assess for Stability.   May, 18 2022 Thyroid/neck surveillance  ultrasound IMPRESSION: 1. Surgical changes of total thyroidectomy. 2. Decreasing conspicuity of small focus of probable residual thyroid tissue in the left resection bed. No evidence of thyroid nodule. 3. Slight interval enlargement of complex cyst within the left submandibular gland now measuring up to 1.5 cm compared to 1.2 cm previously. Given slight interval enlargement, consider fine-needle aspiration biopsy.  Fine-needle aspiration biopsy on March 09, 2021 at [facility]: Left submandibular gland fine-needle aspiration - No malignant cells identified - Hypocellular specimen consistent with benign cyst fluid   Assessment & Plan:   1.  Follicular variant of papillary thyroid cancer  -She is status post negative her thyroidectomy on March 10, 2018 with Dr. Aviva Scott resulting in  Pathologic Stage Classification (pTNM, AJCC 8th Edition): pT2(multifocal), pNX.  -She he is status post radioactive iodine remnant ablation followed by whole body scan which was completed on June 24, 2018 at [facility].  -The whole body scan revealed some thyroid remnant in the thyroid bed, no evidence of  distant metastasis.  Her previsit thyroid/neck ultrasound is consistent with surgical changes of absent residual or recurrent thyroid disease. -She willd not need any further intervention at this time, -I had a long discussion with her regarding the need for continued monitoring with yearly imaging studies for next 5-10 years.  Her thyroglobulin levels are low at <0.1 with negative thyroglobulin antibodies.  -Her recent Thyrogen stimulated whole-body scan showed single, small focus of increased radiotracer uptake within the left lower lobe worrisome for residual/recurrent functioning thyroid tissue.    -Her previsit CT neck/thyroid was unremarkable-no mass or adenopathy.  No definitive finding corresponding to the area of increased uptake on nuclear study.    September 15, 2020 thyroid/neck ultrasound confirming possible residual thyroid tissue measuring 1.2 cm on the left thyroid bed, and 1.2 cm complex cyst within the left submandibular gland differentials include ductal ectasia, abscess, cyst, or less likely cystic neoplasm.  -Fine-needle aspiration biopsy negative for malignancy, scheduled for surgical excision of the cyst at [facility].   2.  Postsurgical hypothyroidism  -Her previsit thyroid function tests are consistent with appropriate replacement.  She is advised to continue  levothyroxine 112 mcg p.o. daily before breakfast.     - We discussed about the correct intake of her thyroid hormone, on empty stomach at fasting, with water, separated by at least 30 minutes from breakfast and other medications,  and separated by more than 4 hours from calcium, iron, multivitamins, acid reflux medications (PPIs). -Patient is made aware of the fact that thyroid hormone replacement is needed for life, dose to be adjusted by periodic monitoring of thyroid function tests.    -  I advised her  to maintain close follow up with Rebecca Baltimore, MD for primary care needs.   I spent 21 minutes in  the care of the patient today including review of labs from Thyroid Function, CMP, and other relevant labs ; imaging/biopsy records (current and previous including abstractions from other facilities); face-to-face time discussing  her lab results and symptoms, medications doses, her options of short and long term treatment based on the latest standards of care / guidelines;   and documenting the encounter.  Rebecca Scott  participated in the discussions, expressed understanding, and voiced agreement with the above plans.  All questions were answered to her satisfaction. she is encouraged to contact clinic should she have any questions or concerns prior to her return visit.    Follow up plan: Return in about 6 months (around 11/14/2021) for F/U with Pre-visit Labs.   Glade Lloyd, MD [facility] [address] [place], [place] [address] Phone: [phone]  Fax: [phone]     05/17/2021, 3:52 PM  This note was partially dictated with voice recognition software. Similar sounding words can be transcribed inadequately or may not  be corrected upon review.

## 2021-11-08 ENCOUNTER — Encounter (HOSPITAL_COMMUNITY): Payer: Self-pay

## 2021-11-09 ENCOUNTER — Other Ambulatory Visit: Payer: Self-pay | Admitting: *Deleted

## 2021-11-09 DIAGNOSIS — Z91041 Radiographic dye allergy status: Secondary | ICD-10-CM

## 2021-11-09 MED ORDER — PREDNISONE 50 MG PO TABS: ORAL_TABLET | ORAL | 0 refills | Status: DC

## 2021-11-09 MED ORDER — DIPHENHYDRAMINE HCL 50 MG PO TABS: 50.0000 mg | ORAL_TABLET | Freq: Once | ORAL | 0 refills | Status: DC

## 2021-11-13 ENCOUNTER — Inpatient Hospital Stay (HOSPITAL_COMMUNITY): Payer: Medicare Other | Attending: Hematology

## 2021-11-13 ENCOUNTER — Other Ambulatory Visit: Payer: Self-pay

## 2021-11-13 ENCOUNTER — Other Ambulatory Visit (HOSPITAL_COMMUNITY)
Admission: RE | Admit: 2021-11-13 | Discharge: 2021-11-13 | Disposition: A | Discharge status: Home / Self Care | Payer: Medicare Other | Source: Ambulatory Visit | Attending: "Endocrinology | Admitting: "Endocrinology

## 2021-11-13 ENCOUNTER — Ambulatory Visit (HOSPITAL_COMMUNITY)
Admission: RE | Admit: 2021-11-13 | Discharge: 2021-11-13 | Disposition: A | Discharge status: Home / Self Care | Payer: Medicare Other | Source: Ambulatory Visit | Attending: Hematology | Admitting: Hematology

## 2021-11-13 DIAGNOSIS — N189 Chronic kidney disease, unspecified: Secondary | ICD-10-CM | POA: Diagnosis not present

## 2021-11-13 DIAGNOSIS — E890 Postprocedural hypothyroidism: Secondary | ICD-10-CM | POA: Insufficient documentation

## 2021-11-13 DIAGNOSIS — I129 Hypertensive chronic kidney disease with stage 1 through stage 4 chronic kidney disease, or unspecified chronic kidney disease: Secondary | ICD-10-CM | POA: Diagnosis not present

## 2021-11-13 DIAGNOSIS — Z9049 Acquired absence of other specified parts of digestive tract: Secondary | ICD-10-CM | POA: Insufficient documentation

## 2021-11-13 DIAGNOSIS — Z85850 Personal history of malignant neoplasm of thyroid: Secondary | ICD-10-CM

## 2021-11-13 DIAGNOSIS — N281 Cyst of kidney, acquired: Secondary | ICD-10-CM | POA: Insufficient documentation

## 2021-11-13 DIAGNOSIS — N2889 Other specified disorders of kidney and ureter: Secondary | ICD-10-CM | POA: Diagnosis not present

## 2021-11-13 DIAGNOSIS — Z85528 Personal history of other malignant neoplasm of kidney: Secondary | ICD-10-CM

## 2021-11-13 DIAGNOSIS — Z79899 Other long term (current) drug therapy: Secondary | ICD-10-CM | POA: Insufficient documentation

## 2021-11-13 DIAGNOSIS — C73 Malignant neoplasm of thyroid gland: Secondary | ICD-10-CM | POA: Insufficient documentation

## 2021-11-13 DIAGNOSIS — Z905 Acquired absence of kidney: Secondary | ICD-10-CM | POA: Diagnosis not present

## 2021-11-13 DIAGNOSIS — K5730 Diverticulosis of large intestine without perforation or abscess without bleeding: Secondary | ICD-10-CM | POA: Diagnosis not present

## 2021-11-13 DIAGNOSIS — Z90710 Acquired absence of both cervix and uterus: Secondary | ICD-10-CM | POA: Diagnosis not present

## 2021-11-13 DIAGNOSIS — D649 Anemia, unspecified: Secondary | ICD-10-CM | POA: Insufficient documentation

## 2021-11-13 LAB — COMPREHENSIVE METABOLIC PANEL
ALT: 23 U/L (ref 0–44)
AST: 23 U/L (ref 15–41)
Albumin: 3.9 g/dL (ref 3.5–5.0)
Alkaline Phosphatase: 115 U/L (ref 38–126)
Anion gap: 9 (ref 5–15)
BUN: 14 mg/dL (ref 8–23)
CO2: 23 mmol/L (ref 22–32)
Calcium: 9.4 mg/dL (ref 8.9–10.3)
Chloride: 104 mmol/L (ref 98–111)
Creatinine, Ser: 0.8 mg/dL (ref 0.44–1.00)
GFR, Estimated: >60 mL/min (ref >60)
Glucose, Bld: 199 mg/dL — ABNORMAL HIGH (ref 70–99)
Potassium: 3.8 mmol/L (ref 3.5–5.1)
Sodium: 136 mmol/L (ref 135–145)
Total Bilirubin: 0.3 mg/dL (ref 0.3–1.2)
Total Protein: 7.7 g/dL (ref 6.5–8.1)

## 2021-11-13 LAB — CBC WITH DIFFERENTIAL/PLATELET
Abs Immature Granulocytes: 0.03 10*3/uL (ref 0.00–0.07)
Basophils Absolute: 0 10*3/uL (ref 0.0–0.1)
Basophils Relative: 0 %
Eosinophils Absolute: 0 10*3/uL (ref 0.0–0.5)
Eosinophils Relative: 0 %
HCT: 37.2 % (ref 36.0–46.0)
Hemoglobin: 11.7 g/dL — ABNORMAL LOW (ref 12.0–15.0)
Immature Granulocytes: 0 %
Lymphocytes Relative: 9 %
Lymphs Abs: 0.7 10*3/uL (ref 0.7–4.0)
MCH: 28.5 pg (ref 26.0–34.0)
MCHC: 31.5 g/dL (ref 30.0–36.0)
MCV: 90.7 fL (ref 80.0–100.0)
Monocytes Absolute: 0.1 10*3/uL (ref 0.1–1.0)
Monocytes Relative: 1 %
Neutro Abs: 7 10*3/uL (ref 1.7–7.7)
Neutrophils Relative %: 90 %
Platelets: 151 10*3/uL (ref 150–400)
RBC: 4.1 MIL/uL (ref 3.87–5.11)
RDW: 15.6 % — ABNORMAL HIGH (ref 11.5–15.5)
WBC: 7.8 10*3/uL (ref 4.0–10.5)
nRBC: 0 % (ref 0.0–0.2)

## 2021-11-13 LAB — TSH: TSH: 0.703 u[IU]/mL (ref 0.350–4.500)

## 2021-11-13 LAB — IRON AND TIBC
Iron: 55 ug/dL (ref 28–170)
Saturation Ratios: 14 % (ref 10.4–31.8)
TIBC: 383 ug/dL (ref 250–450)
UIBC: 328 ug/dL

## 2021-11-13 LAB — T4, FREE: Free T4: 0.92 ng/dL (ref 0.61–1.12)

## 2021-11-13 LAB — LACTATE DEHYDROGENASE: LDH: 152 U/L (ref 98–192)

## 2021-11-13 LAB — FERRITIN: Ferritin: 65 ng/mL (ref 11–307)

## 2021-11-13 MED ORDER — IOHEXOL 350 MG/ML SOLN
80.0000 mL | Freq: Once | INTRAVENOUS | Status: AC | PRN
  Administered 2021-11-13: 80 mL via INTRAVENOUS

## 2021-11-19 LAB — COMPREHENSIVE THYROGLOBULIN
Anti-Thyroglobulin Antibodies: <1 IU/mL
Thyroglobulin (ICMA): <0.1 ng/mL

## 2021-11-20 ENCOUNTER — Other Ambulatory Visit: Payer: Self-pay

## 2021-11-20 ENCOUNTER — Inpatient Hospital Stay (HOSPITAL_COMMUNITY): Payer: Medicare Other | Admitting: Hematology

## 2021-11-20 ENCOUNTER — Encounter: Payer: Self-pay | Admitting: "Endocrinology

## 2021-11-20 ENCOUNTER — Ambulatory Visit: Payer: Medicare Other | Admitting: "Endocrinology

## 2021-11-20 VITALS — BP 122/70 | HR 58 | Temp 98.7°F | Resp 18 | Ht 63.25 in | Wt 210.2 lb

## 2021-11-20 VITALS — BP 110/78 | HR 52 | Ht 63.0 in | Wt 209.0 lb

## 2021-11-20 DIAGNOSIS — Z85850 Personal history of malignant neoplasm of thyroid: Secondary | ICD-10-CM | POA: Diagnosis not present

## 2021-11-20 DIAGNOSIS — Z85528 Personal history of other malignant neoplasm of kidney: Secondary | ICD-10-CM

## 2021-11-20 DIAGNOSIS — E890 Postprocedural hypothyroidism: Secondary | ICD-10-CM | POA: Diagnosis not present

## 2021-11-20 DIAGNOSIS — Z79899 Other long term (current) drug therapy: Secondary | ICD-10-CM | POA: Diagnosis not present

## 2021-11-20 DIAGNOSIS — D649 Anemia, unspecified: Secondary | ICD-10-CM | POA: Diagnosis not present

## 2021-11-20 DIAGNOSIS — C73 Malignant neoplasm of thyroid gland: Secondary | ICD-10-CM | POA: Diagnosis not present

## 2021-11-20 NOTE — Progress Notes (Signed)
[place], [place] [address]   CLINIC:  Medical Oncology/Hematology  PCP:  Henderson Baltimore, MD None None   REASON FOR VISIT:  Follow-up for renal cell carcinoma  PRIOR THERAPY: Right partial nephrectomy on 05/17/2017  NGS Results: not done  CURRENT THERAPY: surveillance  BRIEF ONCOLOGIC HISTORY:  Oncology History  Malignant neoplasm of thyroid gland ([place])  05/27/2018 Initial Diagnosis   Malignant neoplasm of thyroid gland ([place])   12/02/2018 Genetic Testing   ATM (Gain (Exons 62-63) copy number = 3 VUS identified on the multicancer panel.  The Multi-Gene Panel offered by [facility] includes sequencing and/or deletion duplication testing of the following 85 genes: AIP, ALK, APC, ATM, AXIN2,BAP1,  BARD1, BLM, BMPR1A, BRCA1, BRCA2, BRIP1, CASR, CDC73, CDH1, CDK4, CDKN1B, CDKN1C, CDKN2A (p14ARF), CDKN2A (p16INK4a), CEBPA, CHEK2, CTNNA1, DICER1, DIS3L2, EGFR (c.2369C>T, p.Thr790Met variant only), EPCAM (Deletion/duplication testing only), FH, FLCN, GATA2, GPC3, GREM1 (Promoter region deletion/duplication testing only), HOXB13 (c.251G>A, p.Gly84Glu), HRAS, KIT, MAX, MEN1, MET, MITF (c.952G>A, p.Glu318Lys variant only), MLH1, MSH2, MSH3, MSH6, MUTYH, NBN, NF1, NF2, NTHL1, PALB2, PDGFRA, PHOX2B, PMS2, POLD1, POLE, POT1, PRKAR1A, PTCH1, PTEN, RAD50, RAD51C, RAD51D, RB1, RECQL4, RET, RNF43, RUNX1, SDHAF2, SDHA (sequence changes only), SDHB, SDHC, SDHD, SMAD4, SMARCA4, SMARCB1, SMARCE1, STK11, SUFU, TERC, TERT, TMEM127, TP53, TSC1, TSC2, VHL, WRN and WT1.  The report date is December 02, 2018.     CANCER STAGING:  Cancer Staging  No matching staging information was found for the patient.  INTERVAL HISTORY:  [name], a 66 y.o. female, returns for routine follow-up of her renal cell carcinoma. Rebecca Scott was last seen on 11/15/2020.   Today she reports feeling good. She denies fevers, night sweats, weight loss, new pains, and bleeding. She does not  take iron tablets.   REVIEW OF SYSTEMS:  Review of Systems  Constitutional:  Negative for appetite change, fatigue, fever and unexpected weight change.  HENT:   Negative for nosebleeds.   Respiratory:  Negative for hemoptysis.   Gastrointestinal:  Negative for blood in stool.  Endocrine: Negative for hot flashes.  Genitourinary:  Negative for hematuria and vaginal bleeding.   Musculoskeletal:  Positive for back pain (4/10 lower).  All other systems reviewed and are negative.  PAST MEDICAL/SURGICAL HISTORY:  Past Medical History:  Diagnosis Date   Anemia    Anxiety    Arthritis    Asthma    seasonal asthma rarely    Chronic kidney disease    renal mass right kidney    CPAP (continuous positive airway pressure) dependence    Diverticulitis    Family history of cancer of extrahepatic bile ducts    Family history of colon cancer    Family history of kidney cancer    Family history of melanoma    GERD (gastroesophageal reflux disease)    Hemorrhoids    History of kidney stones    HTN (hypertension) 07/29/2014   Hyperlipemia    Hypertension    Migraine    no migraines now, will have stress headaches   Multinodular goiter 08/21/2017   Last Assessment & Plan:  She will need an f/u u/s of thyroid to reassess nodules. Repeat in 07/2018 Nodules are too small for biopsy now.   Papillary renal cell carcinoma ([place]) 05/2017   s/p partial nephrectomy, right   Papillary thyroid carcinoma ([place]) 03/10/2018   PONV (postoperative nausea and vomiting)    patient reports being "slower to wake than average "  Pre-diabetes    "ive been told i'm borderline"    Sleep apnea    CPAP use    Past Surgical History:  Procedure Laterality Date   ABDOMINAL HYSTERECTOMY     APPENDECTOMY  1978   BIOPSY  07/08/2017   Procedure: BIOPSY;  Surgeon: Rogene Houston, MD;  [facility]: [facility];  Service: Endoscopy;;  gastric   CHOLECYSTECTOMY  2005   COLONOSCOPY WITH PROPOFOL N/A 10/28/2017    Procedure: COLONOSCOPY WITH PROPOFOL;  Surgeon: Rogene Houston, MD;  Location: [facility];  Service: Endoscopy;  Laterality: N/A;  7:30   ESOPHAGOGASTRODUODENOSCOPY N/A 07/08/2017   Procedure: ESOPHAGOGASTRODUODENOSCOPY (EGD);  Surgeon: Rogene Houston, MD;  [facility]: [facility];  Service: Endoscopy;  Laterality: N/A;  220   LUMBAR LAMINECTOMY/DECOMPRESSION MICRODISCECTOMY N/A 09/26/2018   Procedure: L4-5 MICRODISCECTOMY;  Surgeon: [name], MD;  Location: [facility];  Service: Orthopedics;  Laterality: N/A;   NASAL SEPTUM SURGERY     ROBOTIC ASSITED PARTIAL NEPHRECTOMY Right 05/17/2017   Procedure: XI ROBOTIC ASSITED PARTIAL NEPHRECTOMY;  Surgeon: Alexis Frock, MD;  Location: [facility];  Service: Urology;  Laterality: Right;   Ruptured Disk L5     THYROIDECTOMY N/A 03/10/2018   Procedure: TOTAL THYROIDECTOMY;  Surgeon: Aviva Signs, MD;  Location: [facility];  Service: General;  Laterality: N/A;    SOCIAL HISTORY:  Social History   Socioeconomic History   Marital status: Widowed    Spouse name: Not on file   Number of children: Not on file   Years of education: Not on file   Highest education level: Not on file  Occupational History   Not on file  Tobacco Use   Smoking status: Never   Smokeless tobacco: Never  Vaping Use   Vaping Use: Never used  Substance and Sexual Activity   Alcohol use: Yes    Comment: rarely    Drug use: No   Sexual activity: Not Currently  Other Topics Concern   Not on file  Social History Narrative   Not on file   Social Determinants of Health   Financial Resource Strain: Not on file  Food Insecurity: Not on file  Transportation Needs: Not on file  Physical Activity: Not on file  Stress: Not on file  Social Connections: Not on file  Intimate Partner Violence: Not on file    FAMILY HISTORY:  Family History  Problem Relation Age of Onset   COPD Mother    Diabetes Mother    Hypertension Mother    High Cholesterol Mother    Liver cancer  Mother        bile duct cancer   COPD Sister    Hypertension Brother    High Cholesterol Brother    Kidney cancer Brother 19   Hypertension Brother    Asthma Son    Congenital heart disease Son        hypoplastic left heart syndrome   Melanoma Maternal Aunt        dx in her 35s   Brain cancer Maternal Grandmother        ? benign   Heart disease Maternal Grandfather    Colon cancer Other    Lung disease Maternal Uncle    Cancer Cousin        ? lunc cancer; mother's mat first cousin    CURRENT MEDICATIONS:  Current Outpatient Medications  Medication Sig Dispense Refill   hydrochlorothiazide (HYDRODIURIL) 25 MG tablet TAKE 1/2 TABLET BY MOUTH DAILY (  Patient taking differently: Take 12 mg by mouth daily.) 45 tablet 0   HYDROcodone-acetaminophen (NORCO/VICODIN) 5-325 MG tablet Take by mouth every 6 (six) hours as needed.     levothyroxine (SYNTHROID) 112 MCG tablet Take 1 tablet (112 mcg total) by mouth daily before breakfast. 90 tablet 1   losartan (COZAAR) 100 MG tablet TAKE 1 TABLET ONCE DAILY. 90 tablet 1   potassium chloride SA (K-DUR,KLOR-CON) 20 MEQ tablet TAKE 1 TABLET TWICE DAILY FOR 3 DAYS THEN 1 TABLET DAILY. 30 tablet 5   predniSONE (DELTASONE) 50 MG tablet Take 1 tablet 13 hrs, 7 hrs and 1 hr prior to CT 3 tablet 0   diphenhydrAMINE (BENADRYL) 50 MG tablet Take 1 tablet (50 mg total) by mouth once for 1 dose. 1 hour prior to CT 1 tablet 0   methocarbamol (ROBAXIN) 500 MG tablet Take 1 tablet (500 mg total) by mouth every 8 (eight) hours as needed for muscle spasms. (Patient not taking: Reported on 11/20/2021) 20 tablet 0   omeprazole (PRILOSEC) 20 MG capsule Take 20 mg by mouth daily. (Patient not taking: Reported on 11/20/2021)     simvastatin (ZOCOR) 40 MG tablet Take 40 mg by mouth daily at 6 PM.      No current facility-administered medications for this visit.    ALLERGIES:  Allergies  Allergen Reactions   Iodinated Contrast Media Hives and Itching   Mango Flavor  Swelling and Rash    Tongue swelling, feels like throat is closing in on her    Isovue [Iopamidol] Hives and Itching    And hives    Nitrofurantoin Hives   Other Hives, Itching, Rash and Other (See Comments)    Use Paper tape ONLY   Sulfa Antibiotics Hives   Adhesive [Tape] Rash and Other (See Comments)    Use Paper tape ONLY    PHYSICAL EXAM:  Performance status (ECOG): 1 - Symptomatic but completely ambulatory  Vitals:   11/20/21 1132  BP: 122/70  Pulse: (!) 58  Resp: 18  Temp: 98.7 F (37.1 C)  SpO2: 96%   Wt Readings from Last 3 Encounters:  11/20/21 210 lb 3.2 oz (95.3 kg)  05/17/21 209 lb 3.2 oz (94.9 kg)  02/13/21 207 lb 3.2 oz (94 kg)   Physical Exam Vitals reviewed.  Constitutional:      Appearance: Normal appearance. She is obese.  Cardiovascular:     Rate and Rhythm: Normal rate and regular rhythm.     Pulses: Normal pulses.     Heart sounds: Normal heart sounds.  Pulmonary:     Effort: Pulmonary effort is normal.     Breath sounds: Normal breath sounds.  Abdominal:     Palpations: Abdomen is soft. There is no mass.     Tenderness: There is no abdominal tenderness.  Neurological:     General: No focal deficit present.     Mental Status: She is alert and oriented to person, place, and time.  Psychiatric:        Mood and Affect: Mood normal.        Behavior: Behavior normal.     LABORATORY DATA:  I have reviewed the labs as listed.  CBC Latest Ref Rng & Units 11/13/2021 11/10/2020 05/03/2020  WBC 4.0 - 10.5 K/uL 7.8 7.1 5.7  Hemoglobin 12.0 - 15.0 g/dL 11.7(L) 11.5(L) 11.0(L)  Hematocrit 36.0 - 46.0 % 37.2 37.1 36.2  Platelets 150 - 400 K/uL 151 164 132(L)   CMP Latest Ref Rng & Units  11/13/2021 11/10/2020 05/03/2020  Glucose 70 - 99 mg/dL 199(H) 200(H) 164(H)  BUN 8 - 23 mg/dL '14 12 11  ' Creatinine 0.44 - 1.00 mg/dL 0.80 0.78 0.73  Sodium 135 - 145 mmol/L 136 137 140  Potassium 3.5 - 5.1 mmol/L 3.8 3.8 3.8  Chloride 98 - 111 mmol/L 104 105 108  CO2 22  - 32 mmol/L 23 21(L) 24  Calcium 8.9 - 10.3 mg/dL 9.4 9.6 9.1  Total Protein 6.5 - 8.1 g/dL 7.7 7.5 6.8  Total Bilirubin 0.3 - 1.2 mg/dL 0.3 0.7 0.6  Alkaline Phos 38 - 126 U/L 115 123 86  AST 15 - 41 U/L '23 26 22  ' ALT 0 - 44 U/L '23 31 26    ' DIAGNOSTIC IMAGING:  I have independently reviewed the scans and discussed with the patient. CT Abdomen Pelvis W Contrast  Result Date: 11/13/2021 CLINICAL DATA:  Follow-up renal cell cancer EXAM: CT ABDOMEN AND PELVIS WITH CONTRAST TECHNIQUE: Multidetector CT imaging of the abdomen and pelvis was performed using the standard protocol following bolus administration of intravenous contrast. RADIATION DOSE REDUCTION: This exam was performed according to the departmental dose-optimization program which includes automated exposure control, adjustment of the mA and/or kV according to patient size and/or use of iterative reconstruction technique. CONTRAST:  43m OMNIPAQUE IOHEXOL 350 MG/ML SOLN COMPARISON:  CT abdomen and pelvis 10/26/2019 FINDINGS: Lower chest: No acute abnormality. Hepatobiliary: Liver is normal in size and contour with no suspicious mass identified. Gallbladder is surgically absent. No biliary ductal dilatation identified. Pancreas: Unremarkable. No pancreatic ductal dilatation or surrounding inflammatory changes. Spleen: Normal in size without focal abnormality. Adrenals/Urinary Tract: Adrenal glands appear normal. Stable appearing surgical changes at the anterior right kidney. Several renal cysts identified bilaterally including a stable 2.3 cm largest cyst anteriorly on the left with a 4 mm calcification posteriorly/dependently. No enhancing renal mass identified. No hydronephrosis. Urinary bladder appears normal. Stomach/Bowel: No bowel obstruction, free air or pneumatosis. No bowel wall edema. Colonic diverticulosis. Appendix not visualized. Vascular/Lymphatic: Aortic atherosclerosis. No enlarged abdominal or pelvic lymph nodes. Reproductive:  Status post hysterectomy. No adnexal masses. Other: No ascites. Musculoskeletal: Degenerative changes in the lumbar spine. No suspicious bony lesions identified. IMPRESSION: 1. No new suspicious mass or lymphadenopathy identified. 2. Bilateral stable renal cysts including a cyst with a small posterior calcification on the left. 3. Colonic diverticulosis. Electronically Signed   By: DOfilia NeasM.D.   On: 11/13/2021 14:41     ASSESSMENT:  1.  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 -CTAP from 10/26/2019 with No findings to suggest recurrent or metastatic disease in the abdomen or pelvis.  Colonic diverticulosis without evidence of acute diverticulitis.  Bilateral renal cysts which are simple cysts and stable. -[facility]   2.  Thyroid cancer: -She underwent thyroidectomy and radioactive iodine treatment. -Synthroid was increased to 125 mcg on 07/30/2019. -She follows up with Dr. NDorris Fetch  I-131 scan on 02/01/2020 showed small single focus of increased uptake within the left lower lobe neck -CT neck soft tissue on 03/25/2020 did not show any mass or adenopathy.  No definite finding corresponding to area of increased uptake on the nuclear study.   3.  Normocytic anemia: -CBC from 10/26/2019 shows hemoglobin 11.8, hematocrit 38, normal white count and platelet count.  MCV is 908.6 -BV78and folic acid were normal.  Ferritin was 84 and percent saturation  was 57.   4.  Family history: -Because of her extensive family history, she met with our genetic counselor. -She did not have any known mutations.   PLAN:  1.  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  No new onset pains. - Physical examination today did not reveal any palpable masses. - Reviewed CTAP with contrast from 11/13/2021 which did not show  any suspicious masses or lymphadenopathy.  Bilateral stable renal cysts including a cyst with a small posterior calcification on the left.  Colonic diverticulosis. - Reviewed labs from 11/13/2021 which shows normal LFTs and creatinine.  Calcium was also normal. - She will complete 5 years since partial nephrectomy in September of this year.  I would not do any further scans.  RTC 1 year for follow-up.  If she continues to be stable, we will discharge her from the clinic at next visit.   2.  Thyroid cancer: - Antithyroglobulin antibodies and thyroglobulin level are normal.  Continue follow-up with Dr. Dorris Fetch.   3.  Normocytic anemia: - Hemoglobin is 11.7.  Ferritin is 65 and percent saturation 14.   Orders placed this encounter:  No orders of the defined types were placed in this encounter.    Derek Jack, MD [place] [phone]   I, Thana Ates, am acting as a scribe for Dr. Derek Jack.  I, Derek Jack MD, have reviewed the above documentation for accuracy and completeness, and I agree with the above.

## 2021-11-20 NOTE — Progress Notes (Signed)
Endocrinology follow-up note                                            11/20/2021, 4:34 PM   Subjective:    Patient ID: Rebecca Scott, female    DOB: [birth year], PCP Rebecca Baltimore, MD   Past Medical History:  Diagnosis Date   Anemia    Anxiety    Arthritis    Asthma    seasonal asthma rarely    Chronic kidney disease    renal mass right kidney    CPAP (continuous positive airway pressure) dependence    Diverticulitis    Family history of cancer of extrahepatic bile ducts    Family history of colon cancer    Family history of kidney cancer    Family history of melanoma    GERD (gastroesophageal reflux disease)    Hemorrhoids    History of kidney stones    HTN (hypertension) 07/29/2014   Hyperlipemia    Hypertension    Migraine    no migraines now, will have stress headaches   Multinodular goiter 08/21/2017   Last Assessment & Plan:  She will need an f/u u/s of thyroid to reassess nodules. Repeat in 07/2018 Nodules are too small for biopsy now.   Papillary renal cell carcinoma ([place]) 05/2017   s/p partial nephrectomy, right   Papillary thyroid carcinoma ([place]) 03/10/2018   PONV (postoperative nausea and vomiting)    patient reports being "slower to wake than average "    Pre-diabetes    "ive been told i'm borderline"    Sleep apnea    CPAP use    Past Surgical History:  Procedure Laterality Date   ABDOMINAL HYSTERECTOMY     APPENDECTOMY  1978   BIOPSY  07/08/2017   Procedure: BIOPSY;  Surgeon: Rogene Houston, MD;  [facility];  Service: Endoscopy;;  gastric   CHOLECYSTECTOMY  2005   COLONOSCOPY WITH PROPOFOL N/A 10/28/2017   Procedure: COLONOSCOPY WITH PROPOFOL;  Surgeon: Rogene Houston, MD;  [facility];  Service: Endoscopy;  Laterality: N/A;  7:30   ESOPHAGOGASTRODUODENOSCOPY N/A 07/08/2017   Procedure: ESOPHAGOGASTRODUODENOSCOPY (EGD);  Surgeon: Rogene Houston, MD;  [facility];  Service: Endoscopy;   Laterality: N/A;  220   LUMBAR LAMINECTOMY/DECOMPRESSION MICRODISCECTOMY N/A 09/26/2018   Procedure: L4-5 MICRODISCECTOMY;  Surgeon: [name], MD;  Location: [facility];  Service: Orthopedics;  Laterality: N/A;   NASAL SEPTUM SURGERY     ROBOTIC ASSITED PARTIAL NEPHRECTOMY Right 05/17/2017   Procedure: XI ROBOTIC ASSITED PARTIAL NEPHRECTOMY;  Surgeon: Alexis Frock, MD;  Location: [facility];  Service: Urology;  Laterality: Right;   Ruptured Disk L5     SUBMANDIBULAR GLAND EXCISION  2022   THYROIDECTOMY N/A 03/10/2018   Procedure: TOTAL THYROIDECTOMY;  Surgeon: Aviva Signs, MD;  Location: [facility];  Service: General;  Laterality: N/A;   Social History   Socioeconomic History   Marital status: Widowed    Spouse name: Not on file   Number of children: Not on file   Years of education: Not on file   Highest education level: Not on file  Occupational History   Not on file  Tobacco Use   Smoking status: Never   Smokeless tobacco: Never  Vaping Use   Vaping Use: Never used  Substance  and Sexual Activity   Alcohol use: Yes    Comment: rarely    Drug use: No   Sexual activity: Not Currently  Other Topics Concern   Not on file  Social History Narrative   Not on file   Social Determinants of Health   Financial Resource Strain: Not on file  Food Insecurity: Not on file  Transportation Needs: Not on file  Physical Activity: Not on file  Stress: Not on file  Social Connections: Not on file   Outpatient Encounter Medications as of 11/20/2021  Medication Sig   methocarbamol (ROBAXIN) 500 MG tablet Take 1 tablet (500 mg total) by mouth every 8 (eight) hours as needed for muscle spasms.   omeprazole (PRILOSEC) 20 MG capsule Take 20 mg by mouth daily as needed.   diphenhydrAMINE (BENADRYL) 50 MG tablet Take 1 tablet (50 mg total) by mouth once for 1 dose. 1 hour prior to CT   hydrochlorothiazide (HYDRODIURIL) 25 MG tablet TAKE 1/2 TABLET BY MOUTH DAILY (Patient taking differently: Take  12 mg by mouth daily.)   HYDROcodone-acetaminophen (NORCO/VICODIN) 5-325 MG tablet Take by mouth every 6 (six) hours as needed.   levothyroxine (SYNTHROID) 112 MCG tablet Take 1 tablet (112 mcg total) by mouth daily before breakfast.   losartan (COZAAR) 100 MG tablet TAKE 1 TABLET ONCE DAILY.   potassium chloride SA (K-DUR,KLOR-CON) 20 MEQ tablet TAKE 1 TABLET TWICE DAILY FOR 3 DAYS THEN 1 TABLET DAILY.   predniSONE (DELTASONE) 50 MG tablet Take 1 tablet 13 hrs, 7 hrs and 1 hr prior to CT   simvastatin (ZOCOR) 40 MG tablet Take 40 mg by mouth daily at 6 PM.    No facility-administered encounter medications on file as of 11/20/2021.   ALLERGIES: Allergies  Allergen Reactions   Iodinated Contrast Media Hives and Itching   Mango Flavor Swelling and Rash    Tongue swelling, feels like throat is closing in on her    Isovue [Iopamidol] Hives and Itching    And hives    Nitrofurantoin Hives   Other Hives, Itching, Rash and Other (See Comments)    Use Paper tape ONLY   Sulfa Antibiotics Hives   Adhesive [Tape] Rash and Other (See Comments)    Use Paper tape ONLY    VACCINATION STATUS: Immunization History  Administered Date(s) Administered   Influenza,inj,Quad PF,6+ Mos 06/06/2018   Influenza-Unspecified 06/24/2017, 06/11/2019   Moderna Sars-Covid-2 Vaccination 04/25/2020, 05/30/2020   Tdap 09/16/2018    HPI Rebecca Scott is 66 y.o. female who is returning for follow-up after recent near total thyroidectomy for thyroid malignancy, and postsurgical hypothyroidism.    -She underwent near total thyroidectomy on March 10, 2018 which revealed bilateral follicular variant papillary thyroid carcinoma Pathologic Stage Classification (pTNM, AJCC 8th Edition): pT2(multifocal), pNX.  She is currently on Synthroid 112 mcg p.o. daily before breakfast.  she reports compliance.  Her previsit thyroid function tests are consistent with  Appropriate replacement.   - She denies dysphagia, odynophagia,  voice change, nor shortness of breath.  Prior to this visit on September 15, 2020 she underwent thyroid/neck ultrasound: Small teardrop focus of residual thyroid tissue in the left resection.  Measuring 1.2 x 0.8 cm with no evidence of nodularity.  1.2 cm complex cyst within the left submandibular gland with differentials including cyst, focal ductal ectasia, abscess, less likely cystic neoplasm.   She underwent fine-needle aspiration biopsy prior to this visit which was negative for malignancy.  She underwent surgical excision :  Diagnosis    A: Submandibular gland, left, excision - Submandibular gland with benign salivary gland cyst (1.2 cm) involved by mixed acute and chronic inflammation - Cyst is completely excised - Negative for malignancy     Review of Systems Limited as above.  Objective:    BP 110/78    Pulse (!) 52    Ht '5\' 3"'$  (1.6 m)    Wt 209 lb (94.8 kg)    BMI 37.02 kg/m   Wt Readings from Last 3 Encounters:  11/20/21 209 lb (94.8 kg)  11/20/21 210 lb 3.2 oz (95.3 kg)  05/17/21 209 lb 3.2 oz (94.9 kg)     Diabetic Labs (most recent): Lab Results  Component Value Date   HGBA1C 5.1 09/08/2018   HGBA1C 5.4 03/06/2018   HGBA1C 5.7 (H) 05/09/2017     Recent Results (from the past 2160 hour(s))  CBC with Differential/Platelet     Status: Abnormal   Collection Time: 11/13/21 10:13 AM  Result Value Ref Range   WBC 7.8 4.0 - 10.5 K/uL   RBC 4.10 3.87 - 5.11 MIL/uL   Hemoglobin 11.7 (L) 12.0 - 15.0 g/dL   HCT 37.2 36.0 - 46.0 %   MCV 90.7 80.0 - 100.0 fL   MCH 28.5 26.0 - 34.0 pg   MCHC 31.5 30.0 - 36.0 g/dL   RDW 15.6 (H) 11.5 - 15.5 %   Platelets 151 150 - 400 K/uL   nRBC 0.0 0.0 - 0.2 %   Neutrophils Relative % 90 %   Neutro Abs 7.0 1.7 - 7.7 K/uL   Lymphocytes Relative 9 %   Lymphs Abs 0.7 0.7 - 4.0 K/uL   Monocytes Relative 1 %   Monocytes Absolute 0.1 0.1 - 1.0 K/uL   Eosinophils Relative 0 %   Eosinophils Absolute 0.0 0.0 - 0.5 K/uL   Basophils Relative  0 %   Basophils Absolute 0.0 0.0 - 0.1 K/uL   Immature Granulocytes 0 %   Abs Immature Granulocytes 0.03 0.00 - 0.07 K/uL    Comment: Performed at [facility], [address] [place], [place] [address]  Comprehensive metabolic panel     Status: Abnormal   Collection Time: 11/13/21 10:13 AM  Result Value Ref Range   Sodium 136 135 - 145 mmol/L   Potassium 3.8 3.5 - 5.1 mmol/L   Chloride 104 98 - 111 mmol/L   CO2 23 22 - 32 mmol/L   Glucose, Bld 199 (H) 70 - 99 mg/dL    Comment: Glucose reference range applies only to samples taken after fasting for at least 8 hours.   BUN 14 8 - 23 mg/dL   Creatinine, Ser 0.80 0.44 - 1.00 mg/dL   Calcium 9.4 8.9 - 10.3 mg/dL   Total Protein 7.7 6.5 - 8.1 g/dL   Albumin 3.9 3.5 - 5.0 g/dL   AST 23 15 - 41 U/L   ALT 23 0 - 44 U/L   Alkaline Phosphatase 115 38 - 126 U/L   Total Bilirubin 0.3 0.3 - 1.2 mg/dL   GFR, Estimated >60 >60 mL/min    Comment: (NOTE) Calculated using the CKD-EPI Creatinine Equation (2021)    Anion gap 9 5 - 15    Comment: Performed at [facility], [address], [place], [place] [address]  Lactate dehydrogenase     Status: None   Collection Time: 11/13/21 10:13 AM  Result Value Ref Range   LDH 152 98 - 192 U/L    Comment: Performed at  [facility], [address], [place], [place] [address]  Ferritin     Status: None   Collection Time: 11/13/21 10:13 AM  Result Value Ref Range   Ferritin 65 11 - 307 ng/mL    Comment: Performed at [facility], [address] [place], [place] [address]  Iron and TIBC     Status: None   Collection Time: 11/13/21 10:13 AM  Result Value Ref Range   Iron 55 28 - 170 ug/dL   TIBC 383 250 - 450 ug/dL   Saturation Ratios 14 10.4 - 31.8 %   UIBC 328 ug/dL    Comment: Performed at [facility], [address] [place], [place] [address]  TSH     Status: None   Collection Time: 11/13/21 10:17 AM  Result Value Ref Range   TSH 0.703 0.350 - 4.500 uIU/mL    Comment: Performed by a 3rd  Generation assay with a functional sensitivity of <=0.01 uIU/mL. Performed at [facility], [address], [place], [place] [address]   T4, free     Status: None   Collection Time: 11/13/21 10:17 AM  Result Value Ref Range   Free T4 0.92 0.61 - 1.12 ng/dL    Comment: (NOTE) Biotin ingestion may interfere with free T4 tests. If the results are inconsistent with the TSH level, previous test results, or the clinical presentation, then consider biotin interference. If needed, order repeat testing after stopping biotin. Performed at [facility], [place] [address], [place], [place] [address]   Comprehensive Thyroglobulin     Status: None   Collection Time: 11/13/21 10:18 AM  Result Value Ref Range   Anti-Thyroglobulin Antibodies <1.0 IU/mL    Comment: (NOTE) Reference Range: <1.0          Negative > or = 1.0    Positive    Thyroglobulin (ICMA) <0.1 ng/mL    Comment: (NOTE) Reference Range: >17y: 1.5 - 38.5 According to the [facility] of Clinical Biochemistry, the reference interval for Thyroglobulin (TG) should be related to euthyroid patients and not for patients who underwent thyroidectomy.  TG reference intervals for these patients depend on the residual mass of the thyroid tissue left after surgery.  Establishing a post-operative baseline is recommended.  The assay quantitation limit is 0.1 ng/mL.    Thyroglobulin Comment ng/mL    Comment: (NOTE) Not applicable This test was developed and its performance characteristics determined by [facility]. It has not been cleared or approved by the Food and Drug Administration. Performed At: [facility] [place], [place] [id number] Martha Clan MD VH:[id number]        Diagnosis 1. Thyroid, lobectomy, left lobe and isthmus - PAPILLARY THYROID CARCINOMA, FOLLICULAR VARIANT, 1.3 CM. - TUMOR CONFINED WITHIN THYROID CAPSULE. - MARGINS NOT INVOLVED. 2. Thyroid, lobectomy, right lobe - PAPILLARY  THYROID CARCINOMA, FOLLICULAR VARIANT, 2.6 CM. - TUMOR FOCALLY LESS THAN 0.1 CM FROM MARGIN.   Pathologic Stage Classification (pTNM, AJCC 8th Edition): pT2(multifocal), pNX.   Thyrogen stimulated whole-body scan on June 23, 2018 FINDINGS: Radio iodine accumulation is seen at the thyroid bed bilaterally consistent with thyroid remnant. No distant sites of abnormal radio iodine accumulation are identified to suggest iodine-avid metastatic thyroid cancer.   IMPRESSION: Thyroid remnant.  No scintigraphic evidence of iodine-avid thyroid cancer metastases.   Thyroid/neck ultrasound for Jan 13, 2019- consistent with surgical changes with absence of residual or recurrent thyroid disease.     Most recent Thyrogen stimulated whole-body  scan on Feb 01, 2020 FINDINGS: A single small focus of increased radiotracer uptake within the lower left neck.   Normal physiologic uptake identified within the oropharynx and GI tract.   IMPRESSION: Single, small focus of increased radiotracer uptake noted within the left lower lobe neck worrisome for residual/recurrent functioning thyroid tissue.    CT scan of neck on March 25, 2020: IMPRESSION: No mass or adenopathy. No definite finding corresponding to area of increased uptake on nuclear study.  Thyroid/neck ultrasound on September 15, 2020 IMPRESSION: 1. Surgical changes of prior thyroid resection. 2. Small teardrop shaped focus of residual thyroid tissue in the left resection bed measuring 0.8 x 1.2 x 0.6 cm. No evidence of thyroid nodule. 3. Complex cyst within the left submandibular gland measures up to 1.2 x 0.8 x 0.8 cm. Differential considerations include cyst, focal ductal ectasia, abscess, and less likely, cystic neoplasm. Consider repeat left submandibular ultrasound in 3-6 months to assess for Stability.   May, 18 2022 Thyroid/neck surveillance  ultrasound IMPRESSION: 1. Surgical changes of total thyroidectomy. 2.  Decreasing conspicuity of small focus of probable residual thyroid tissue in the left resection bed. No evidence of thyroid nodule. 3. Slight interval enlargement of complex cyst within the left submandibular gland now measuring up to 1.5 cm compared to 1.2 cm previously. Given slight interval enlargement, consider fine-needle aspiration biopsy.  Fine-needle aspiration biopsy on March 09, 2021 at [facility]: Left submandibular gland fine-needle aspiration - No malignant cells identified - Hypocellular specimen consistent with benign cyst fluid.  June 06, 2021 surgical excision of this lesion at [facility]  Diagnosis    A: Submandibular gland, left, excision - Submandibular gland with benign salivary gland cyst (1.2 cm) involved by mixed acute and chronic inflammation - Cyst is completely excised - Negative for malignancy    Assessment & Plan:   1.  Follicular variant of papillary thyroid cancer  -She is status post negative her thyroidectomy on March 10, 2018 with Dr. Aviva Signs resulting in  Pathologic Stage Classification (pTNM, AJCC 8th Edition): pT2(multifocal), pNX.  -She he is status post radioactive iodine remnant ablation followed by whole body scan which was completed on June 24, 2018 at [facility].  -The whole body scan revealed some thyroid remnant in the thyroid bed, no evidence of distant metastasis.  Her previsit thyroid/neck ultrasound is consistent with surgical changes of absent residual or recurrent thyroid disease. -She willd not need any further intervention at this time, -I had a long discussion with her regarding the need for continued monitoring with yearly imaging studies for next 5-10 years.  Her thyroglobulin levels are low at <0.1 with negative thyroglobulin antibodies.  -Her recent Thyrogen stimulated whole-body scan showed single, small focus of increased radiotracer uptake within the left lower lobe worrisome for residual/recurrent  functioning thyroid tissue.    -Her previsit CT neck/thyroid was unremarkable-no mass or adenopathy.  No definitive finding corresponding to the area of increased uptake on nuclear study.    September 15, 2020 thyroid/neck ultrasound confirming possible residual thyroid tissue measuring 1.2 cm on the left thyroid bed, and 1.2 cm complex cyst within the left submandibular gland differentials include ductal ectasia, abscess, cyst, or less likely cystic neoplasm.  -surgical excision : benign cyst, submandibular gland.   2.  Postsurgical hypothyroidism  -Her previsit thyroid function tests are  consistent with  appropriate replacement.  She is advised to continue  levothyroxine 112 mcg p.o. daily before breakfast.     -  We discussed about the correct intake of her thyroid hormone, on empty stomach at fasting, with water, separated by at least 30 minutes from breakfast and other medications,  and separated by more than 4 hours from calcium, iron, multivitamins, acid reflux medications (PPIs). -Patient is made aware of the fact that thyroid hormone replacement is needed for life, dose to be adjusted by periodic monitoring of thyroid function tests.  - I advised her  to maintain close follow up with Rebecca Baltimore, MD for primary care needs.   I spent 31 minutes in the care of the patient today including review of labs from Thyroid Function, CMP, and other relevant labs ; imaging/biopsy records (current and previous including abstractions from other facilities); face-to-face time discussing  her lab results and symptoms, medications doses, her options of short and long term treatment based on the latest standards of care / guidelines;   and documenting the encounter.  Rebecca Scott  participated in the discussions, expressed understanding, and voiced agreement with the above plans.  All questions were answered to her satisfaction. she is encouraged to contact clinic should she have any questions or  concerns prior to her return visit.   Follow up plan: Return in about 6 months (around 05/23/2022) for F/U with Pre-visit Labs.   Glade Lloyd, MD [facility] [address] [place], [place] [address] Phone: [phone]  Fax: [phone]     11/20/2021, 4:34 PM  This note was partially dictated with voice recognition software. Similar sounding words can be transcribed inadequately or may not  be corrected upon review.

## 2021-11-20 NOTE — Patient Instructions (Signed)
[place] at [facility] ?Discharge Instructions ? ? ?You were seen and examined today by Dr. Delton Coombes. ? ?He reviewed the results of your lab work and CT scan which were normal/stable. ? ?Return as scheduled in 1 year.  ? ? ?Thank you for choosing [place] at [facility] to provide your oncology and hematology care.  To afford each patient quality time with our provider, please arrive at least 15 minutes before your scheduled appointment time.  ? ?If you have a lab appointment with the [facility] please come in thru the Main Entrance and check in at the main information desk. ? ?You need to re-schedule your appointment should you arrive 10 or more minutes late.  We strive to give you quality time with our providers, and arriving late affects you and other patients whose appointments are after yours.  Also, if you no show three or more times for appointments you may be dismissed from the clinic at the providers discretion.     ?Again, thank you for choosing [facility].  Our hope is that these requests will decrease the amount of time that you wait before being seen by our physicians.       ?_____________________________________________________________ ? ?Should you have questions after your visit to [facility], please contact our office at [phone] and follow the prompts.  Our office hours are 8:00 a.m. and 4:30 p.m. Monday - Friday.  Please note that voicemails left after 4:00 p.m. may not be returned until the following business day.  We are closed weekends and major holidays.  You do have access to a nurse 24-7, just call the main number to the clinic [phone] and do not press any options, hold on the line and a nurse will answer the phone.   ? ?For prescription refill requests, have your pharmacy contact our office and allow 72 hours.   ? ?Due to Covid, you will need to wear a mask upon entering the hospital. If you do  not have a mask, a mask will be given to you at the Main Entrance upon arrival. For doctor visits, patients may have 1 support person age 41 or older with them. For treatment visits, patients can not have anyone with them due to social distancing guidelines and our immunocompromised population.  ? ?   ?

## 2022-01-13 ENCOUNTER — Other Ambulatory Visit: Payer: Self-pay | Admitting: "Endocrinology

## 2022-01-13 DIAGNOSIS — E890 Postprocedural hypothyroidism: Secondary | ICD-10-CM

## 2022-05-17 ENCOUNTER — Encounter: Payer: Self-pay | Admitting: "Endocrinology

## 2022-05-23 ENCOUNTER — Ambulatory Visit: Payer: Medicare Other | Admitting: "Endocrinology

## 2022-05-23 ENCOUNTER — Other Ambulatory Visit (HOSPITAL_COMMUNITY)
Admission: RE | Admit: 2022-05-23 | Discharge: 2022-05-23 | Disposition: A | Discharge status: Home / Self Care | Payer: Medicare Other | Source: Ambulatory Visit | Attending: "Endocrinology | Admitting: "Endocrinology

## 2022-05-23 DIAGNOSIS — E890 Postprocedural hypothyroidism: Secondary | ICD-10-CM | POA: Diagnosis present

## 2022-05-23 DIAGNOSIS — Z85528 Personal history of other malignant neoplasm of kidney: Secondary | ICD-10-CM | POA: Diagnosis present

## 2022-05-23 DIAGNOSIS — C73 Malignant neoplasm of thyroid gland: Secondary | ICD-10-CM | POA: Diagnosis present

## 2022-05-23 DIAGNOSIS — Z85850 Personal history of malignant neoplasm of thyroid: Secondary | ICD-10-CM | POA: Insufficient documentation

## 2022-05-23 LAB — T4, FREE: Free T4: 0.84 ng/dL (ref 0.61–1.12)

## 2022-05-23 LAB — TSH: TSH: 4.35 u[IU]/mL (ref 0.350–4.500)

## 2022-05-28 LAB — THYROGLOBULIN LEVEL: Thyroglobulin: <2 ng/mL

## 2022-05-30 ENCOUNTER — Encounter: Payer: Self-pay | Admitting: "Endocrinology

## 2022-05-30 ENCOUNTER — Ambulatory Visit: Payer: Medicare Other | Admitting: "Endocrinology

## 2022-05-30 VITALS — BP 128/80 | HR 56 | Ht 63.0 in | Wt 212.0 lb

## 2022-05-30 DIAGNOSIS — C73 Malignant neoplasm of thyroid gland: Secondary | ICD-10-CM | POA: Diagnosis not present

## 2022-05-30 DIAGNOSIS — E890 Postprocedural hypothyroidism: Secondary | ICD-10-CM | POA: Diagnosis not present

## 2022-05-30 MED ORDER — LEVOTHYROXINE SODIUM 125 MCG PO TABS: 125.0000 ug | ORAL_TABLET | Freq: Every day | ORAL | 1 refills | Status: DC

## 2022-05-30 NOTE — Progress Notes (Signed)
Endocrinology follow-up note                                            05/30/2022, 2:36 PM   Subjective:    Patient ID: Rebecca Scott, female    DOB: 1956-04-02, PCP Rebecca Baltimore, MD   Past Medical History:  Diagnosis Date   Anemia    Anxiety    Arthritis    Asthma    seasonal asthma rarely    Chronic kidney disease    renal mass right kidney    CPAP (continuous positive airway pressure) dependence    Diverticulitis    Family history of cancer of extrahepatic bile ducts    Family history of colon cancer    Family history of kidney cancer    Family history of melanoma    GERD (gastroesophageal reflux disease)    Hemorrhoids    History of kidney stones    HTN (hypertension) 07/29/2014   Hyperlipemia    Hypertension    Migraine    no migraines now, will have stress headaches   Multinodular goiter 08/21/2017   Last Assessment & Plan:  She will need an f/u u/s of thyroid to reassess nodules. Repeat in 07/2018 Nodules are too small for biopsy now.   Papillary renal cell carcinoma ([place]) 05/2017   s/p partial nephrectomy, right   Papillary thyroid carcinoma ([place]) 03/10/2018   PONV (postoperative nausea and vomiting)    patient reports being "slower to wake than average "    Pre-diabetes    "ive been told i'm borderline"    Sleep apnea    CPAP use    Past Surgical History:  Procedure Laterality Date   ABDOMINAL HYSTERECTOMY     APPENDECTOMY  1978   BIOPSY  07/08/2017   Procedure: BIOPSY;  Surgeon: Rebecca Houston, MD;  Location: [facility];  Service: Endoscopy;;  gastric   CHOLECYSTECTOMY  2005   COLONOSCOPY WITH PROPOFOL N/A 10/28/2017   Procedure: COLONOSCOPY WITH PROPOFOL;  Surgeon: Rebecca Houston, MD;  Location: [facility];  Service: Endoscopy;  Laterality: N/A;  7:30   ESOPHAGOGASTRODUODENOSCOPY N/A 07/08/2017   Procedure: ESOPHAGOGASTRODUODENOSCOPY (EGD);  Surgeon: Rebecca Houston, MD;  [facility]: [facility];  Service: Endoscopy;   Laterality: N/A;  220   LUMBAR LAMINECTOMY/DECOMPRESSION MICRODISCECTOMY N/A 09/26/2018   Procedure: L4-5 MICRODISCECTOMY;  Surgeon: Rebecca Killings, MD;  Location: [facility];  Service: Orthopedics;  Laterality: N/A;   NASAL SEPTUM SURGERY     ROBOTIC ASSITED PARTIAL NEPHRECTOMY Right 05/17/2017   Procedure: XI ROBOTIC ASSITED PARTIAL NEPHRECTOMY;  Surgeon: Rebecca Frock, MD;  Location: [facility];  Service: Urology;  Laterality: Right;   Ruptured Disk L5     SUBMANDIBULAR GLAND EXCISION  2022   THYROIDECTOMY N/A 03/10/2018   Procedure: TOTAL THYROIDECTOMY;  Surgeon: Rebecca Signs, MD;  Location: [facility];  Service: General;  Laterality: N/A;   Social History   Socioeconomic History   Marital status: Widowed    Spouse name: Not on file   Number of children: Not on file   Years of education: Not on file   Highest education level: Not on file  Occupational History   Not on file  Tobacco Use   Smoking status: Never   Smokeless tobacco: Never  Vaping Use   Vaping Use: Never used  Substance  and Sexual Activity   Alcohol use: Yes    Comment: rarely    Drug use: No   Sexual activity: Not Currently  Other Topics Concern   Not on file  Social History Narrative   Not on file   Social Determinants of Health   Financial Resource Strain: Not on file  Food Insecurity: Not on file  Transportation Needs: Not on file  Physical Activity: Not on file  Stress: Not on file  Social Connections: Not on file   Outpatient Encounter Medications as of 05/30/2022  Medication Sig   diphenhydrAMINE (BENADRYL) 50 MG tablet Take 1 tablet (50 mg total) by mouth once for 1 dose. 1 hour prior to CT   hydrochlorothiazide (HYDRODIURIL) 25 MG tablet TAKE 1/2 TABLET BY MOUTH DAILY (Patient taking differently: Take 12 mg by mouth daily.)   HYDROcodone-acetaminophen (NORCO/VICODIN) 5-325 MG tablet Take by mouth every 6 (six) hours as needed.   levothyroxine (SYNTHROID) 125 MCG tablet Take 1 tablet (125 mcg total) by  mouth daily before breakfast.   losartan (COZAAR) 100 MG tablet TAKE 1 TABLET ONCE DAILY.   methocarbamol (ROBAXIN) 500 MG tablet Take 1 tablet (500 mg total) by mouth every 8 (eight) hours as needed for muscle spasms.   omeprazole (PRILOSEC) 20 MG capsule Take 20 mg by mouth daily as needed.   potassium chloride SA (K-DUR,KLOR-CON) 20 MEQ tablet TAKE 1 TABLET TWICE DAILY FOR 3 DAYS THEN 1 TABLET DAILY.   predniSONE (DELTASONE) 50 MG tablet Take 1 tablet 13 hrs, 7 hrs and 1 hr prior to CT   simvastatin (ZOCOR) 40 MG tablet Take 40 mg by mouth daily at 6 PM.    [DISCONTINUED] levothyroxine (SYNTHROID) 112 MCG tablet TAKE ONE TABLET (112 MCG TOTAL) BY MOUTH DAILY BEFORE BREAKFAST.   No facility-administered encounter medications on file as of 05/30/2022.   ALLERGIES: Allergies  Allergen Reactions   Iodinated Contrast Media Hives and Itching   Mango Flavor Swelling and Rash    Tongue swelling, feels like throat is closing in on her    Isovue [Iopamidol] Hives and Itching    And hives    Nitrofurantoin Hives   Other Hives, Itching, Rash and Other (See Comments)    Use Paper tape ONLY   Sulfa Antibiotics Hives   Adhesive [Tape] Rash and Other (See Comments)    Use Paper tape ONLY    VACCINATION STATUS: Immunization History  Administered Date(s) Administered   Influenza,inj,Quad PF,6+ Mos 06/06/2018   Influenza-Unspecified 06/24/2017, 06/11/2019   Moderna Sars-Covid-2 Vaccination 04/25/2020, 05/30/2020   Tdap 09/16/2018    HPI Rebecca Scott is 66 y.o. female who is returning for follow-up after recent near total thyroidectomy for thyroid malignancy, and postsurgical hypothyroidism.    -She underwent near total thyroidectomy on March 10, 2018 which revealed bilateral follicular variant papillary thyroid carcinoma Pathologic Stage Classification (pTNM, AJCC 8th Edition): pT2(multifocal), pNX.  She is currently on Synthroid 112 mcg p.o. daily before breakfast.  she reports compliance.   Her previsit thyroid function tests are consistent with  Appropriate replacement.   - She denies dysphagia, odynophagia, voice change, nor shortness of breath.  Prior to this visit on September 15, 2020 she underwent thyroid/neck ultrasound: Small teardrop focus of residual thyroid tissue in the left resection.  Measuring 1.2 x 0.8 cm with no evidence of nodularity.  1.2 cm complex cyst within the left submandibular gland with differentials including cyst, focal ductal ectasia, abscess, less likely cystic neoplasm.   She underwent  fine-needle aspiration biopsy prior to this visit which was negative for malignancy.  She underwent surgical excision :  Diagnosis    A: Submandibular gland, left, excision - Submandibular gland with benign salivary gland cyst (1.2 cm) involved by mixed acute and chronic inflammation - Cyst is completely excised - Negative for malignancy     Review of Systems Limited as above.  Objective:    BP 128/80   Pulse (!) 56   Ht '5\' 3"'$  (1.6 m)   Wt 212 lb (96.2 kg)   BMI 37.55 kg/m   Wt Readings from Last 3 Encounters:  05/30/22 212 lb (96.2 kg)  11/20/21 209 lb (94.8 kg)  11/20/21 210 lb 3.2 oz (95.3 kg)     Diabetic Labs (most recent): Lab Results  Component Value Date   HGBA1C 5.1 09/08/2018   HGBA1C 5.4 03/06/2018   HGBA1C 5.7 (H) 05/09/2017     Recent Results (from the past 2160 hour(s))  TSH     Status: None   Collection Time: 05/23/22 11:22 AM  Result Value Ref Range   TSH 4.350 0.350 - 4.500 uIU/mL    Comment: Performed by a 3rd Generation assay with a functional sensitivity of <=0.01 uIU/mL. Performed at [facility], [address], [place], [place] [address]   T4, free     Status: None   Collection Time: 05/23/22 11:22 AM  Result Value Ref Range   Free T4 0.84 0.61 - 1.12 ng/dL    Comment: (NOTE) Biotin ingestion may interfere with free T4 tests. If the results are inconsistent with the TSH level, previous test results, or the clinical  presentation, then consider biotin interference. If needed, order repeat testing after stopping biotin. Performed at [facility], [place] [address] [place], [place] [address]   Thyroglobulin Level     Status: None   Collection Time: 05/23/22 11:22 AM  Result Value Ref Range   Thyroglobulin <2.0 ng/mL    Comment: (NOTE) This test was developed and its performance characteristics determined by [facility]. It has not been cleared or approved by the Food and Drug Administration. Reference Range: Pubertal Children and Adults: <40 According to the [facility] of Clinical Biochemistry, the reference interval for Thyroglobulin (TG) should be related to euthyroid patients and not for patients who underwent thyroidectomy.  TG reference intervals for these patients depend on the residual mass of the thyroid tissue left after surgery.  Establishing a post-operative baseline is recommended.  The assay quantitation limit is 2.0 ng/mL. Performed At: [place], [place] [id number] Martha Clan MD [id number]        Diagnosis 1. Thyroid, lobectomy, left lobe and isthmus - PAPILLARY THYROID CARCINOMA, FOLLICULAR VARIANT, 1.3 CM. - TUMOR CONFINED WITHIN THYROID CAPSULE. - MARGINS NOT INVOLVED. 2. Thyroid, lobectomy, right lobe - PAPILLARY THYROID CARCINOMA, FOLLICULAR VARIANT, 2.6 CM. - TUMOR FOCALLY LESS THAN 0.1 CM FROM MARGIN.   Pathologic Stage Classification (pTNM, AJCC 8th Edition): pT2(multifocal), pNX.   Thyrogen stimulated whole-body scan on June 23, 2018 FINDINGS: Radio iodine accumulation is seen at the thyroid bed bilaterally consistent with thyroid remnant. No distant sites of abnormal radio iodine accumulation are identified to suggest iodine-avid metastatic thyroid cancer.   IMPRESSION: Thyroid remnant.  No scintigraphic evidence of iodine-avid thyroid cancer metastases.   Thyroid/neck ultrasound for Jan 13, 2019-  consistent with surgical changes with absence of residual or recurrent thyroid disease.     Most recent Thyrogen stimulated whole-body scan  on Feb 01, 2020 FINDINGS: A single small focus of increased radiotracer uptake within the lower left neck.   Normal physiologic uptake identified within the oropharynx and GI tract.   IMPRESSION: Single, small focus of increased radiotracer uptake noted within the left lower lobe neck worrisome for residual/recurrent functioning thyroid tissue.    CT scan of neck on March 25, 2020: IMPRESSION: No mass or adenopathy. No definite finding corresponding to area of increased uptake on nuclear study.  Thyroid/neck ultrasound on September 15, 2020 IMPRESSION: 1. Surgical changes of prior thyroid resection. 2. Small teardrop shaped focus of residual thyroid tissue in the left resection bed measuring 0.8 x 1.2 x 0.6 cm. No evidence of thyroid nodule. 3. Complex cyst within the left submandibular gland measures up to 1.2 x 0.8 x 0.8 cm. Differential considerations include cyst, focal ductal ectasia, abscess, and less likely, cystic neoplasm. Consider repeat left submandibular ultrasound in 3-6 months to assess for Stability.   May, 18 2022 Thyroid/neck surveillance  ultrasound IMPRESSION: 1. Surgical changes of total thyroidectomy. 2. Decreasing conspicuity of small focus of probable residual thyroid tissue in the left resection bed. No evidence of thyroid nodule. 3. Slight interval enlargement of complex cyst within the left submandibular gland now measuring up to 1.5 cm compared to 1.2 cm previously. Given slight interval enlargement, consider fine-needle aspiration biopsy.  Fine-needle aspiration biopsy on March 09, 2021 at [facility]: Left submandibular gland fine-needle aspiration - No malignant cells identified - Hypocellular specimen consistent with benign cyst fluid.  June 06, 2021 surgical excision of this lesion at [facility]  Diagnosis    A: Submandibular gland, left, excision - Submandibular gland with benign salivary gland cyst (1.2 cm) involved by mixed acute and chronic inflammation - Cyst is completely excised - Negative for malignancy    Assessment & Plan:   1.  Follicular variant of papillary thyroid cancer  -She is status post negative her thyroidectomy on March 10, 2018 with Dr. Aviva Scott resulting in  Pathologic Stage Classification (pTNM, AJCC 8th Edition): pT2(multifocal), pNX.  -She he is status post radioactive iodine remnant ablation followed by whole body scan which was completed on June 24, 2018 at [facility].  -The whole body scan revealed some thyroid remnant in the thyroid bed, no evidence of distant metastasis.  Her previsit thyroid/neck ultrasound is consistent with surgical changes of absent residual or recurrent thyroid disease. -She willd not need any further intervention at this time, -I had a long discussion with her regarding the need for continued monitoring with yearly imaging studies for next 5-10 years.  Her thyroglobulin levels are low at <0.1 with negative thyroglobulin antibodies.  -Her recent Thyrogen stimulated whole-body scan showed single, small focus of increased radiotracer uptake within the left lower lobe worrisome for residual/recurrent functioning thyroid tissue.    -Her previsit CT neck/thyroid was unremarkable-no mass or adenopathy.  No definitive finding corresponding to the area of increased uptake on nuclear study.    September 15, 2020 thyroid/neck ultrasound confirming possible residual thyroid tissue measuring 1.2 cm on the left thyroid bed, and 1.2 cm complex cyst within the left submandibular gland differentials include ductal ectasia, abscess, cyst, or less likely cystic neoplasm.  -surgical excision : benign cyst, submandibular gland.  Her thyroglobulin remains undetectable.  She will be considered for thyroid/neck ultrasound after her  next visit.   2.  Postsurgical hypothyroidism  -Her previsit thyroid function tests are such that she will benefit from slight increase in  her levothyroxine dose.  I discussed and increase her levothyroxine to 125 mcg p.o. daily before breakfast   - We discussed about the correct intake of her thyroid hormone, on empty stomach at fasting, with water, separated by at least 30 minutes from breakfast and other medications,  and separated by more than 4 hours from calcium, iron, multivitamins, acid reflux medications (PPIs). -Patient is made aware of the fact that thyroid hormone replacement is needed for life, dose to be adjusted by periodic monitoring of thyroid function tests.  - I advised her  to maintain close follow up with Rebecca Baltimore, MD for primary care needs.    I spent 31 minutes in the care of the patient today including review of labs from Thyroid Function, CMP, and other relevant labs ; imaging/biopsy records (current and previous including abstractions from other facilities); face-to-face time discussing  her lab results and symptoms, medications doses, her options of short and long term treatment based on the latest standards of care / guidelines;   and documenting the encounter.  Rebecca Scott  participated in the discussions, expressed understanding, and voiced agreement with the above plans.  All questions were answered to her satisfaction. she is encouraged to contact clinic should she have any questions or concerns prior to her return visit.  Follow up plan: Return in about 6 months (around 11/28/2022) for F/U with Pre-visit Labs.   Glade Lloyd, MD [facility] [address] [place], [place] [address] Phone: [phone]  Fax: [phone]     05/30/2022, 2:36 PM  This note was partially dictated with voice recognition software. Similar sounding words can be transcribed inadequately or may not  be corrected  upon review.

## 2022-07-11 ENCOUNTER — Other Ambulatory Visit: Payer: Self-pay | Admitting: "Endocrinology

## 2022-07-21 ENCOUNTER — Encounter (INDEPENDENT_AMBULATORY_CARE_PROVIDER_SITE_OTHER): Payer: Self-pay | Admitting: Gastroenterology

## 2022-10-02 ENCOUNTER — Encounter: Payer: Self-pay | Admitting: "Endocrinology

## 2022-11-08 ENCOUNTER — Other Ambulatory Visit: Payer: Self-pay | Admitting: *Deleted

## 2022-11-08 DIAGNOSIS — Z85528 Personal history of other malignant neoplasm of kidney: Secondary | ICD-10-CM

## 2022-11-09 HISTORY — PX: EPIDURAL BLOCK INJECTION: SHX1516

## 2022-11-12 ENCOUNTER — Inpatient Hospital Stay: Payer: Medicare Other | Attending: Hematology

## 2022-11-12 DIAGNOSIS — Z800 Family history of malignant neoplasm of digestive organs: Secondary | ICD-10-CM | POA: Insufficient documentation

## 2022-11-12 DIAGNOSIS — Z79899 Other long term (current) drug therapy: Secondary | ICD-10-CM | POA: Insufficient documentation

## 2022-11-12 DIAGNOSIS — Z9049 Acquired absence of other specified parts of digestive tract: Secondary | ICD-10-CM | POA: Insufficient documentation

## 2022-11-12 DIAGNOSIS — E785 Hyperlipidemia, unspecified: Secondary | ICD-10-CM | POA: Insufficient documentation

## 2022-11-12 DIAGNOSIS — Z85528 Personal history of other malignant neoplasm of kidney: Secondary | ICD-10-CM | POA: Insufficient documentation

## 2022-11-12 DIAGNOSIS — N189 Chronic kidney disease, unspecified: Secondary | ICD-10-CM | POA: Insufficient documentation

## 2022-11-12 DIAGNOSIS — Z90710 Acquired absence of both cervix and uterus: Secondary | ICD-10-CM | POA: Insufficient documentation

## 2022-11-12 DIAGNOSIS — D649 Anemia, unspecified: Secondary | ICD-10-CM | POA: Insufficient documentation

## 2022-11-12 DIAGNOSIS — I129 Hypertensive chronic kidney disease with stage 1 through stage 4 chronic kidney disease, or unspecified chronic kidney disease: Secondary | ICD-10-CM | POA: Insufficient documentation

## 2022-11-12 DIAGNOSIS — Z85850 Personal history of malignant neoplasm of thyroid: Secondary | ICD-10-CM | POA: Insufficient documentation

## 2022-11-12 DIAGNOSIS — Z905 Acquired absence of kidney: Secondary | ICD-10-CM | POA: Insufficient documentation

## 2022-11-12 DIAGNOSIS — Z8051 Family history of malignant neoplasm of kidney: Secondary | ICD-10-CM | POA: Insufficient documentation

## 2022-11-19 ENCOUNTER — Ambulatory Visit (INDEPENDENT_AMBULATORY_CARE_PROVIDER_SITE_OTHER): Payer: Medicare Other | Admitting: "Endocrinology

## 2022-11-19 ENCOUNTER — Encounter: Payer: Self-pay | Admitting: "Endocrinology

## 2022-11-19 ENCOUNTER — Inpatient Hospital Stay (HOSPITAL_BASED_OUTPATIENT_CLINIC_OR_DEPARTMENT_OTHER): Payer: Medicare Other | Admitting: Hematology

## 2022-11-19 ENCOUNTER — Encounter: Payer: Self-pay | Admitting: Hematology

## 2022-11-19 VITALS — BP 104/76 | HR 76 | Ht 63.0 in | Wt 216.0 lb

## 2022-11-19 VITALS — BP 140/75 | HR 58 | Temp 98.7°F | Resp 17 | Ht 63.0 in | Wt 216.7 lb

## 2022-11-19 DIAGNOSIS — Z90710 Acquired absence of both cervix and uterus: Secondary | ICD-10-CM | POA: Diagnosis not present

## 2022-11-19 DIAGNOSIS — I129 Hypertensive chronic kidney disease with stage 1 through stage 4 chronic kidney disease, or unspecified chronic kidney disease: Secondary | ICD-10-CM | POA: Diagnosis not present

## 2022-11-19 DIAGNOSIS — Z79899 Other long term (current) drug therapy: Secondary | ICD-10-CM | POA: Diagnosis not present

## 2022-11-19 DIAGNOSIS — N189 Chronic kidney disease, unspecified: Secondary | ICD-10-CM | POA: Diagnosis not present

## 2022-11-19 DIAGNOSIS — Z8051 Family history of malignant neoplasm of kidney: Secondary | ICD-10-CM | POA: Diagnosis not present

## 2022-11-19 DIAGNOSIS — E785 Hyperlipidemia, unspecified: Secondary | ICD-10-CM | POA: Diagnosis not present

## 2022-11-19 DIAGNOSIS — E890 Postprocedural hypothyroidism: Secondary | ICD-10-CM

## 2022-11-19 DIAGNOSIS — D649 Anemia, unspecified: Secondary | ICD-10-CM | POA: Diagnosis not present

## 2022-11-19 DIAGNOSIS — Z800 Family history of malignant neoplasm of digestive organs: Secondary | ICD-10-CM | POA: Diagnosis not present

## 2022-11-19 DIAGNOSIS — Z85850 Personal history of malignant neoplasm of thyroid: Secondary | ICD-10-CM | POA: Diagnosis present

## 2022-11-19 DIAGNOSIS — Z9049 Acquired absence of other specified parts of digestive tract: Secondary | ICD-10-CM | POA: Diagnosis not present

## 2022-11-19 DIAGNOSIS — Z85528 Personal history of other malignant neoplasm of kidney: Secondary | ICD-10-CM

## 2022-11-19 DIAGNOSIS — C73 Malignant neoplasm of thyroid gland: Secondary | ICD-10-CM

## 2022-11-19 DIAGNOSIS — Z905 Acquired absence of kidney: Secondary | ICD-10-CM | POA: Diagnosis not present

## 2022-11-19 NOTE — Progress Notes (Signed)
Endocrinology follow-up note                                            11/19/2022, 4:44 PM   Subjective:    Patient ID: Rebecca Scott, female    DOB: July 26, 1956, PCP Henderson Baltimore, MD   Past Medical History:  Diagnosis Date   Anemia    Anxiety    Arthritis    Asthma    seasonal asthma rarely    Chronic kidney disease    renal mass right kidney    CPAP (continuous positive airway pressure) dependence    Diverticulitis    Family history of cancer of extrahepatic bile ducts    Family history of colon cancer    Family history of kidney cancer    Family history of melanoma    GERD (gastroesophageal reflux disease)    Hemorrhoids    History of kidney stones    HTN (hypertension) 07/29/2014   Hyperlipemia    Hypertension    Migraine    no migraines now, will have stress headaches   Multinodular goiter 08/21/2017   Last Assessment & Plan:  She will need an f/u u/s of thyroid to reassess nodules. Repeat in 07/2018 Nodules are too small for biopsy now.   Papillary renal cell carcinoma ([place]) 05/2017   s/p partial nephrectomy, right   Papillary thyroid carcinoma ([place]) 03/10/2018   PONV (postoperative nausea and vomiting)    patient reports being "slower to wake than average "    Pre-diabetes    "ive been told i'm borderline"    Sleep apnea    CPAP use    Past Surgical History:  Procedure Laterality Date   ABDOMINAL HYSTERECTOMY     APPENDECTOMY  1978   BIOPSY  07/08/2017   Procedure: BIOPSY;  Surgeon: Rogene Houston, MD;  Location: [facility];  Service: Endoscopy;;  gastric   CHOLECYSTECTOMY  2005   COLONOSCOPY WITH PROPOFOL N/A 10/28/2017   Procedure: COLONOSCOPY WITH PROPOFOL;  Surgeon: Rogene Houston, MD;  Location: [facility];  Service: Endoscopy;  Laterality: N/A;  7:30   EPIDURAL BLOCK INJECTION  11/09/2022   ESOPHAGOGASTRODUODENOSCOPY N/A 07/08/2017   Procedure: ESOPHAGOGASTRODUODENOSCOPY (EGD);  Surgeon: Rogene Houston, MD;  [facility]: [facility];  Service: Endoscopy;  Laterality: N/A;  220   LUMBAR LAMINECTOMY/DECOMPRESSION MICRODISCECTOMY N/A 09/26/2018   Procedure: L4-5 MICRODISCECTOMY;  Surgeon: [name], MD;  Location: [facility];  Service: Orthopedics;  Laterality: N/A;   NASAL SEPTUM SURGERY     ROBOTIC ASSITED PARTIAL NEPHRECTOMY Right 05/17/2017   Procedure: XI ROBOTIC ASSITED PARTIAL NEPHRECTOMY;  Surgeon: Alexis Frock, MD;  Location: [facility];  Service: Urology;  Laterality: Right;   Ruptured Disk L5     SUBMANDIBULAR GLAND EXCISION  2022   THYROIDECTOMY N/A 03/10/2018   Procedure: TOTAL THYROIDECTOMY;  Surgeon: Aviva Signs, MD;  Location: [facility];  Service: General;  Laterality: N/A;   Social History   Socioeconomic History   Marital status: Widowed    Spouse name: Not on file   Number of children: Not on file   Years of education: Not on file   Highest education level: Not on file  Occupational History   Not on file  Tobacco Use   Smoking status: Never   Smokeless tobacco: Never  Vaping Use  Vaping Use: Never used  Substance and Sexual Activity   Alcohol use: Yes    Comment: rarely    Drug use: No   Sexual activity: Not Currently  Other Topics Concern   Not on file  Social History Narrative   Not on file   Social Determinants of Health   Financial Resource Strain: Not on file  Food Insecurity: Not on file  Transportation Needs: Not on file  Physical Activity: Not on file  Stress: Not on file  Social Connections: Not on file   Outpatient Encounter Medications as of 11/19/2022  Medication Sig   diphenhydrAMINE (BENADRYL) 50 MG tablet Take 1 tablet (50 mg total) by mouth once for 1 dose. 1 hour prior to CT   hydrochlorothiazide (HYDRODIURIL) 25 MG tablet TAKE 1/2 TABLET BY MOUTH DAILY (Patient taking differently: Take 12 mg by mouth daily.)   HYDROcodone-acetaminophen (NORCO/VICODIN) 5-325 MG tablet Take by mouth every 6 (six) hours as needed.   levothyroxine (SYNTHROID) 125 MCG tablet  Take 1 tablet (125 mcg total) by mouth daily before breakfast.   levothyroxine (SYNTHROID) 125 MCG tablet Take 1 tablet (125 mcg total) by mouth daily before breakfast.   losartan (COZAAR) 100 MG tablet TAKE 1 TABLET ONCE DAILY.   methocarbamol (ROBAXIN) 500 MG tablet Take 1 tablet (500 mg total) by mouth every 8 (eight) hours as needed for muscle spasms.   omeprazole (PRILOSEC) 20 MG capsule Take 20 mg by mouth daily as needed.   potassium chloride SA (K-DUR,KLOR-CON) 20 MEQ tablet TAKE 1 TABLET TWICE DAILY FOR 3 DAYS THEN 1 TABLET DAILY.   predniSONE (DELTASONE) 50 MG tablet Take 1 tablet 13 hrs, 7 hrs and 1 hr prior to CT   simvastatin (ZOCOR) 40 MG tablet Take 40 mg by mouth daily at 6 PM.    No facility-administered encounter medications on file as of 11/19/2022.   ALLERGIES: Allergies  Allergen Reactions   Iodinated Contrast Media Hives and Itching   Mango Flavor Swelling and Rash    Tongue swelling, feels like throat is closing in on her    Isovue [Iopamidol] Hives and Itching    And hives    Nitrofurantoin Hives   Other Hives, Itching, Rash and Other (See Comments)    Use Paper tape ONLY   Sulfa Antibiotics Hives   Adhesive [Tape] Rash and Other (See Comments)    Use Paper tape ONLY    VACCINATION STATUS: Immunization History  Administered Date(s) Administered   Influenza,inj,Quad PF,6+ Mos 06/06/2018   Influenza-Unspecified 06/24/2017, 06/11/2019   Moderna Sars-Covid-2 Vaccination 04/25/2020, 05/30/2020   Tdap 09/16/2018    HPI Rebecca Scott is 67 y.o. female who is returning for follow-up after recent near total thyroidectomy for thyroid malignancy, and postsurgical hypothyroidism.    -She underwent near total thyroidectomy on March 10, 2018 which revealed bilateral follicular variant papillary thyroid carcinoma Pathologic Stage Classification (pTNM, AJCC 8th Edition): pT2(multifocal), pNX.  She is currently on Synthroid 112 mcg p.o. daily before breakfast.  she  reports compliance.  Her previsit thyroid function tests are consistent with  Appropriate replacement.  Her serum thyroglobulin level is still pending.  - She denies dysphagia, odynophagia, voice change, nor shortness of breath.  Prior to this visit on September 15, 2020 she underwent thyroid/neck ultrasound: Small teardrop focus of residual thyroid tissue in the left resection.  Measuring 1.2 x 0.8 cm with no evidence of nodularity.  1.2 cm complex cyst within the left submandibular gland with differentials including cyst,  focal ductal ectasia, abscess, less likely cystic neoplasm.   She underwent fine-needle aspiration biopsy prior to this visit which was negative for malignancy.  She underwent surgical excision :  Diagnosis    A: Submandibular gland, left, excision - Submandibular gland with benign salivary gland cyst (1.2 cm) involved by mixed acute and chronic inflammation - Cyst is completely excised - Negative for malignancy     Review of Systems Limited as above.  Objective:    BP 104/76   Pulse 76   Ht '5\' 3"'$  (1.6 m)   Wt 216 lb (98 kg)   BMI 38.26 kg/m   Wt Readings from Last 3 Encounters:  11/19/22 216 lb (98 kg)  11/19/22 216 lb 11.2 oz (98.3 kg)  05/30/22 212 lb (96.2 kg)     Diabetic Labs (most recent): Lab Results  Component Value Date   HGBA1C 5.1 09/08/2018   HGBA1C 5.4 03/06/2018   HGBA1C 5.7 (H) 05/09/2017     Recent Results (from the past 2160 hour(s))  TSH     Status: None   Collection Time: 11/12/22  9:49 AM  Result Value Ref Range   TSH 3.000 0.450 - 4.500 uIU/mL  T4, free     Status: None   Collection Time: 11/12/22  9:49 AM  Result Value Ref Range   Free T4 1.20 0.82 - 1.77 ng/dL  Thyroglobulin Level     Status: None (Preliminary result)   Collection Time: 11/12/22  9:49 AM  Result Value Ref Range   Thyroglobulin (TG-RIA) WILL FOLLOW   Lipid panel     Status: None   Collection Time: 11/12/22  9:49 AM  Result Value Ref Range   Cholesterol,  Total 139 100 - 199 mg/dL   Triglycerides 63 0 - 149 mg/dL   HDL 53 >39 mg/dL   VLDL Cholesterol Cal 13 5 - 40 mg/dL   LDL Chol Calc (NIH) 73 0 - 99 mg/dL   Chol/HDL Ratio 2.6 0.0 - 4.4 ratio    Comment:                                   T. Chol/HDL Ratio                                             Men  Women                               1/2 Avg.Risk  3.4    3.3                                   Avg.Risk  5.0    4.4                                2X Avg.Risk  9.6    7.1                                3X Avg.Risk 23.4   11.0        Diagnosis 1. Thyroid, lobectomy, left lobe and isthmus -  PAPILLARY THYROID CARCINOMA, FOLLICULAR VARIANT, 1.3 CM. - TUMOR CONFINED WITHIN THYROID CAPSULE. - MARGINS NOT INVOLVED. 2. Thyroid, lobectomy, right lobe - PAPILLARY THYROID CARCINOMA, FOLLICULAR VARIANT, 2.6 CM. - TUMOR FOCALLY LESS THAN 0.1 CM FROM MARGIN.   Pathologic Stage Classification (pTNM, AJCC 8th Edition): pT2(multifocal), pNX.   Thyrogen stimulated whole-body scan on June 23, 2018 FINDINGS: Radio iodine accumulation is seen at the thyroid bed bilaterally consistent with thyroid remnant. No distant sites of abnormal radio iodine accumulation are identified to suggest iodine-avid metastatic thyroid cancer.   IMPRESSION: Thyroid remnant.  No scintigraphic evidence of iodine-avid thyroid cancer metastases.   Thyroid/neck ultrasound for Jan 13, 2019- consistent with surgical changes with absence of residual or recurrent thyroid disease.     Most recent Thyrogen stimulated whole-body scan on Feb 01, 2020 FINDINGS: A single small focus of increased radiotracer uptake within the lower left neck.   Normal physiologic uptake identified within the oropharynx and GI tract.   IMPRESSION: Single, small focus of increased radiotracer uptake noted within the left lower lobe neck worrisome for residual/recurrent functioning thyroid tissue.    CT scan of neck on March 25, 2020: IMPRESSION: No mass or adenopathy. No definite finding corresponding to area of increased uptake on nuclear study.  Thyroid/neck ultrasound on September 15, 2020 IMPRESSION: 1. Surgical changes of prior thyroid resection. 2. Small teardrop shaped focus of residual thyroid tissue in the left resection bed measuring 0.8 x 1.2 x 0.6 cm. No evidence of thyroid nodule. 3. Complex cyst within the left submandibular gland measures up to 1.2 x 0.8 x 0.8 cm. Differential considerations include cyst, focal ductal ectasia, abscess, and less likely, cystic neoplasm. Consider repeat left submandibular ultrasound in 3-6 months to assess for Stability.   May, 18 2022 Thyroid/neck surveillance  ultrasound IMPRESSION: 1. Surgical changes of total thyroidectomy. 2. Decreasing conspicuity of small focus of probable residual thyroid tissue in the left resection bed. No evidence of thyroid nodule. 3. Slight interval enlargement of complex cyst within the left submandibular gland now measuring up to 1.5 cm compared to 1.2 cm previously. Given slight interval enlargement, consider fine-needle aspiration biopsy.  Fine-needle aspiration biopsy on March 09, 2021 at [facility]: Left submandibular gland fine-needle aspiration - No malignant cells identified - Hypocellular specimen consistent with benign cyst fluid.  June 06, 2021 surgical excision of this lesion at [facility]  Diagnosis    A: Submandibular gland, left, excision - Submandibular gland with benign salivary gland cyst (1.2 cm) involved by mixed acute and chronic inflammation - Cyst is completely excised - Negative for malignancy    Assessment & Plan:   1.  Follicular variant of papillary thyroid cancer  -She is status post negative her thyroidectomy on March 10, 2018 with Dr. Aviva Signs resulting in  Pathologic Stage Classification (pTNM, AJCC 8th Edition): pT2(multifocal), pNX.  -She he is status post radioactive iodine  remnant ablation followed by whole body scan which was completed on June 24, 2018 [facility].  -The whole body scan revealed some thyroid remnant in the thyroid bed, no evidence of distant metastasis.  Her previsit thyroid/neck ultrasound is consistent with surgical changes of absent residual or recurrent thyroid disease. -She willd not need any further intervention at this time, -I had a long discussion with her regarding the need for continued monitoring with yearly imaging studies for next 5-10 years.  -Her recent Thyrogen stimulated whole-body scan showed single, small focus of increased radiotracer uptake within the left  lower lobe worrisome for residual/recurrent functioning thyroid tissue.    -Her previsit CT neck/thyroid was unremarkable-no mass or adenopathy.  No definitive finding corresponding to the area of increased uptake on nuclear study.    September 15, 2020 thyroid/neck ultrasound confirming possible residual thyroid tissue measuring 1.2 cm on the left thyroid bed, and 1.2 cm complex cyst within the left submandibular gland differentials include ductal ectasia, abscess, cyst, or less likely cystic neoplasm.  -surgical excision : benign cyst, submandibular gland.  Prior to her last visit, thyroglobulin was undetectable.  Her previsit thyroglobulin level is still pending.  She will be considered for Thyrogen stimulated whole-body scan before her next visit in 6 months.       2.  Postsurgical hypothyroidism  -Her previsit thyroid function tests are consistent with appropriate replacement.  She is advised to continue levothyroxine 125 mcg p.o. daily before breakfast.     - We discussed about the correct intake of her thyroid hormone, on empty stomach at fasting, with water, separated by at least 30 minutes from breakfast and other medications,  and separated by more than 4 hours from calcium, iron, multivitamins, acid reflux medications (PPIs). -Patient is made aware  of the fact that thyroid hormone replacement is needed for life, dose to be adjusted by periodic monitoring of thyroid function tests.   - I advised her  to maintain close follow up with Henderson Baltimore, MD for primary care needs.   I spent  21  minutes in the care of the patient today including review of labs from Thyroid Function, CMP, and other relevant labs ; imaging/biopsy records (current and previous including abstractions from other facilities); face-to-face time discussing  her lab results and symptoms, medications doses, her options of short and long term treatment based on the latest standards of care / guidelines;   and documenting the encounter.  Rebecca Scott  participated in the discussions, expressed understanding, and voiced agreement with the above plans.  All questions were answered to her satisfaction. she is encouraged to contact clinic should she have any questions or concerns prior to her return visit.   Follow up plan: Return in about 6 months (around 05/22/2023) for Fasting Labs  in AM B4 8, F/U with Whole Body Scan w/Thyrogen.   Glade Lloyd, MD [facility] [address] [place], [place] [address] Phone: [phone]  Fax: [phone]     11/19/2022, 4:44 PM  This note was partially dictated with voice recognition software. Similar sounding words can be transcribed inadequately or may not  be corrected upon review.

## 2022-11-19 NOTE — Progress Notes (Signed)
[facility] [address], [place] [address]    Clinic Day:  11/19/2022  Referring physician: Henderson Baltimore, MD  Patient Care Team: Henderson Baltimore, MD as PCP - General (Internal Medicine) Harl Bowie Alphonse Guild, MD as PCP - Cardiology (Cardiology) Gala Romney Cristopher Estimable, MD as Consulting Physician (Gastroenterology)   ASSESSMENT & PLAN:   Assessment: 1.  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 -CTAP from 10/26/2019 with No findings to suggest recurrent or metastatic disease in the abdomen or pelvis.  Colonic diverticulosis without evidence of acute diverticulitis.  Bilateral renal cysts which are simple cysts and stable. -She has moved to [facility].   2.  Thyroid cancer: -She underwent thyroidectomy and radioactive iodine treatment. -Synthroid was increased to 125 mcg on 07/30/2019. -She follows up with Dr. Dorris Fetch.  I-131 scan on 02/01/2020 showed small single focus of increased uptake within the left lower lobe neck -CT neck soft tissue on 03/25/2020 did not show any mass or adenopathy.  No definite finding corresponding to area of increased uptake on the nuclear study.   3.  Normocytic anemia: -CBC from 10/26/2019 shows hemoglobin 11.8, hematocrit 38, normal white count and platelet count.  MCV is 99991111. -123456 and folic acid were normal.  Ferritin was 84 and percent saturation was 16.   4.  Family history: -Because of her extensive family history, she met with our genetic counselor. -She did not have any known mutations.  Plan: 1.  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  No B symptoms. - Physical examination was within normal limits. - I have reviewed labs from [place] which are within normal limits. - She is past 5 years since diagnosis.  She does not require any imaging.   Hence I have recommended to follow-up as needed.   2.  Thyroid cancer: - Continue follow-ups with Dr. Dorris Fetch.   3.  Normocytic anemia: - Hemoglobin is normalized.  No orders of the defined types were placed in this encounter.     I,Alexis Herring,acting as a Education administrator for [employer], MD.,have documented all relevant documentation on the behalf of Derek Jack, MD,as directed by  Derek Jack, MD while in the presence of Derek Jack, MD.   I, Derek Jack MD, have reviewed the above documentation for accuracy and completeness, and I agree with the above.   Derek Jack, MD   3/11/20244:33 PM  CHIEF COMPLAINT:   Diagnosis: renal cell carcinoma    Cancer Staging  No matching staging information was found for the patient.   Prior Therapy: Right partial nephrectomy on 05/17/2017   Current Therapy:  surveillance   HISTORY OF PRESENT ILLNESS:   Oncology History  Malignant neoplasm of thyroid gland ([place])  05/27/2018 Initial Diagnosis   Malignant neoplasm of thyroid gland ([place])   12/02/2018 Genetic Testing   ATM (Gain (Exons 62-63) copy number = 3 VUS identified on the multicancer panel.  The Multi-Gene Panel offered by [facility] includes sequencing and/or deletion duplication testing of the following 85 genes: AIP, ALK, APC, ATM, AXIN2,BAP1,  BARD1, BLM, BMPR1A, BRCA1, BRCA2, BRIP1, CASR, CDC73, CDH1, CDK4, CDKN1B, CDKN1C, CDKN2A (p14ARF), CDKN2A (p16INK4a), CEBPA, CHEK2, CTNNA1, DICER1, DIS3L2, EGFR (c.2369C>T, p.Thr790Met variant only), EPCAM (Deletion/duplication testing only), FH, FLCN, GATA2, GPC3, GREM1 (Promoter region deletion/duplication testing only), HOXB13 (c.251G>A, p.Gly84Glu), HRAS, KIT, MAX, MEN1,  MET, MITF (c.952G>A, p.Glu318Lys variant only), MLH1, MSH2, MSH3, MSH6, MUTYH, NBN, NF1, NF2, NTHL1, PALB2, PDGFRA, PHOX2B, PMS2, POLD1, POLE, POT1, PRKAR1A, PTCH1, PTEN, RAD50, RAD51C, RAD51D, RB1, RECQL4, RET, RNF43, RUNX1, SDHAF2, SDHA  (sequence changes only), SDHB, SDHC, SDHD, SMAD4, SMARCA4, SMARCB1, SMARCE1, STK11, SUFU, TERC, TERT, TMEM127, TP53, TSC1, TSC2, VHL, WRN and WT1.  The report date is December 02, 2018.      INTERVAL HISTORY:   Rebecca Scott is a 67 y.o. female presenting to clinic today for follow up of renal cell carcinoma. She was last seen by me on 11/20/21.  Today, she states that she is doing well overall. Her appetite level is at 100%. Her energy level is at 100%. She denies any new onset pains.  Denies any infections, fevers night sweats or weight loss.   PAST MEDICAL HISTORY:   Past Medical History: Past Medical History:  Diagnosis Date   Anemia    Anxiety    Arthritis    Asthma    seasonal asthma rarely    Chronic kidney disease    renal mass right kidney    CPAP (continuous positive airway pressure) dependence    Diverticulitis    Family history of cancer of extrahepatic bile ducts    Family history of colon cancer    Family history of kidney cancer    Family history of melanoma    GERD (gastroesophageal reflux disease)    Hemorrhoids    History of kidney stones    HTN (hypertension) 07/29/2014   Hyperlipemia    Hypertension    Migraine    no migraines now, will have stress headaches   Multinodular goiter 08/21/2017   Last Assessment & Plan:  She will need an f/u u/s of thyroid to reassess nodules. Repeat in 07/2018 Nodules are too small for biopsy now.   Papillary renal cell carcinoma ([place]) 05/2017   s/p partial nephrectomy, right   Papillary thyroid carcinoma ([place]) 03/10/2018   PONV (postoperative nausea and vomiting)    patient reports being "slower to wake than average "    Pre-diabetes    "ive been told i'm borderline"    Sleep apnea    CPAP use     Surgical History: Past Surgical History:  Procedure Laterality Date   ABDOMINAL HYSTERECTOMY     APPENDECTOMY  1978   BIOPSY  07/08/2017   Procedure: BIOPSY;  Surgeon: Rogene Houston, MD;  [facility];  Service:  Endoscopy;;  gastric   CHOLECYSTECTOMY  2005   COLONOSCOPY WITH PROPOFOL N/A 10/28/2017   Procedure: COLONOSCOPY WITH PROPOFOL;  Surgeon: Rogene Houston, MD;  [facility]: [facility];  Service: Endoscopy;  Laterality: N/A;  7:30   EPIDURAL BLOCK INJECTION  11/09/2022   ESOPHAGOGASTRODUODENOSCOPY N/A 07/08/2017   Procedure: ESOPHAGOGASTRODUODENOSCOPY (EGD);  Surgeon: Rogene Houston, MD;  [facility];  Service: Endoscopy;  Laterality: N/A;  220   LUMBAR LAMINECTOMY/DECOMPRESSION MICRODISCECTOMY N/A 09/26/2018   Procedure: L4-5 MICRODISCECTOMY;  Surgeon: [name], MD;  Location: [facility];  Service: Orthopedics;  Laterality: N/A;   NASAL SEPTUM SURGERY     ROBOTIC ASSITED PARTIAL NEPHRECTOMY Right 05/17/2017   Procedure: XI ROBOTIC ASSITED PARTIAL NEPHRECTOMY;  Surgeon: Alexis Frock, MD;  Location: [facility];  Service: Urology;  Laterality: Right;   Ruptured Disk L5     SUBMANDIBULAR GLAND EXCISION  2022   THYROIDECTOMY N/A 03/10/2018   Procedure: TOTAL THYROIDECTOMY;  Surgeon: Aviva Signs, MD;  Location: [facility];  Service: General;  Laterality: N/A;    Social History: Social History   Socioeconomic History   Marital status: Widowed    Spouse name: Not on file   Number of children: Not on file   Years of education: Not on file   Highest education level: Not on file  Occupational History   Not on file  Tobacco Use   Smoking status: Never   Smokeless tobacco: Never  Vaping Use   Vaping Use: Never used  Substance and Sexual Activity   Alcohol use: Yes    Comment: rarely    Drug use: No   Sexual activity: Not Currently  Other Topics Concern   Not on file  Social History Narrative   Not on file   Social Determinants of Health   Financial Resource Strain: Not on file  Food Insecurity: Not on file  Transportation Needs: Not on file  Physical Activity: Not on file  Stress: Not on file  Social Connections: Not on file  Intimate Partner Violence: Not on file     Family History: Family History  Problem Relation Age of Onset   COPD Mother    Diabetes Mother    Hypertension Mother    High Cholesterol Mother    Liver cancer Mother        bile duct cancer   COPD Sister    Hypertension Brother    High Cholesterol Brother    Kidney cancer Brother 66   Hypertension Brother    Asthma Son    Congenital heart disease Son        hypoplastic left heart syndrome   Melanoma Maternal Aunt        dx in her 6s   Brain cancer Maternal Grandmother        ? benign   Heart disease Maternal Grandfather    Colon cancer Other    Lung disease Maternal Uncle    Cancer Cousin        ? lunc cancer; mother's mat first cousin    Current Medications:  Current Outpatient Medications:    diphenhydrAMINE (BENADRYL) 50 MG tablet, Take 1 tablet (50 mg total) by mouth once for 1 dose. 1 hour prior to CT, Disp: 1 tablet, Rfl: 0   hydrochlorothiazide (HYDRODIURIL) 25 MG tablet, TAKE 1/2 TABLET BY MOUTH DAILY (Patient taking differently: Take 12 mg by mouth daily.), Disp: 45 tablet, Rfl: 0   HYDROcodone-acetaminophen (NORCO/VICODIN) 5-325 MG tablet, Take by mouth every 6 (six) hours as needed., Disp: , Rfl:    levothyroxine (SYNTHROID) 125 MCG tablet, Take 1 tablet (125 mcg total) by mouth daily before breakfast., Disp: 90 tablet, Rfl: 1   levothyroxine (SYNTHROID) 125 MCG tablet, Take 1 tablet (125 mcg total) by mouth daily before breakfast., Disp: 90 tablet, Rfl: 1   losartan (COZAAR) 100 MG tablet, TAKE 1 TABLET ONCE DAILY., Disp: 90 tablet, Rfl: 1   methocarbamol (ROBAXIN) 500 MG tablet, Take 1 tablet (500 mg total) by mouth every 8 (eight) hours as needed for muscle spasms., Disp: 20 tablet, Rfl: 0   omeprazole (PRILOSEC) 20 MG capsule, Take 20 mg by mouth daily as needed., Disp: , Rfl:    potassium chloride SA (K-DUR,KLOR-CON) 20 MEQ tablet, TAKE 1 TABLET TWICE DAILY FOR 3 DAYS THEN 1 TABLET DAILY., Disp: 30 tablet, Rfl: 5   predniSONE (DELTASONE) 50 MG tablet,  Take 1 tablet 13 hrs, 7 hrs and 1 hr prior to CT, Disp: 3 tablet, Rfl: 0   simvastatin (ZOCOR) 40 MG tablet, Take  40 mg by mouth daily at 6 PM. , Disp: , Rfl:    Allergies: Allergies  Allergen Reactions   Iodinated Contrast Media Hives and Itching   Mango Flavor Swelling and Rash    Tongue swelling, feels like throat is closing in on her    Isovue [Iopamidol] Hives and Itching    And hives    Nitrofurantoin Hives   Other Hives, Itching, Rash and Other (See Comments)    Use Paper tape ONLY   Sulfa Antibiotics Hives   Adhesive [Tape] Rash and Other (See Comments)    Use Paper tape ONLY    REVIEW OF SYSTEMS:   Review of Systems  Constitutional:  Negative for chills, fatigue and fever.  HENT:   Negative for lump/mass, mouth sores, nosebleeds, sore throat and trouble swallowing.   Eyes:  Negative for eye problems.  Respiratory:  Negative for cough and shortness of breath.   Cardiovascular:  Negative for chest pain, leg swelling and palpitations.  Gastrointestinal:  Negative for abdominal pain, constipation, diarrhea, nausea and vomiting.  Genitourinary:  Negative for bladder incontinence, difficulty urinating, dysuria, frequency, hematuria and nocturia.   Musculoskeletal:  Negative for arthralgias, back pain, flank pain, myalgias and neck pain.  Skin:  Negative for itching and rash.  Neurological:  Negative for dizziness, headaches and numbness.  Hematological:  Does not bruise/bleed easily.  Psychiatric/Behavioral:  Negative for depression, sleep disturbance and suicidal ideas. The patient is not nervous/anxious.   All other systems reviewed and are negative.    VITALS:   Blood pressure (!) 140/75, pulse (!) 58, temperature 98.7 F (37.1 C), temperature source Tympanic, resp. rate 17, height '5\' 3"'$  (1.6 m), weight 216 lb 11.2 oz (98.3 kg), SpO2 98 %.  Wt Readings from Last 3 Encounters:  11/19/22 216 lb (98 kg)  11/19/22 216 lb 11.2 oz (98.3 kg)  05/30/22 212 lb (96.2 kg)     Body mass index is 38.39 kg/m.  Performance status (ECOG): 1 - Symptomatic but completely ambulatory  PHYSICAL EXAM:   Physical Exam Vitals and nursing note reviewed. Exam conducted with a chaperone present.  Constitutional:      Appearance: Normal appearance.  Cardiovascular:     Rate and Rhythm: Normal rate and regular rhythm.     Pulses: Normal pulses.     Heart sounds: Normal heart sounds.  Pulmonary:     Effort: Pulmonary effort is normal.     Breath sounds: Normal breath sounds.  Abdominal:     Palpations: Abdomen is soft. There is no hepatomegaly, splenomegaly or mass.     Tenderness: There is no abdominal tenderness.  Musculoskeletal:     Right lower leg: No edema.     Left lower leg: No edema.  Lymphadenopathy:     Cervical: No cervical adenopathy.     Right cervical: No superficial, deep or posterior cervical adenopathy.    Left cervical: No superficial, deep or posterior cervical adenopathy.     Upper Body:     Right upper body: No supraclavicular or axillary adenopathy.     Left upper body: No supraclavicular or axillary adenopathy.  Neurological:     General: No focal deficit present.     Mental Status: She is alert and oriented to person, place, and time.  Psychiatric:        Mood and Affect: Mood normal.        Behavior: Behavior normal.     LABS:      Latest Ref Rng &  Units 11/13/2021   10:13 AM 11/10/2020   12:57 PM 05/03/2020    1:33 PM  CBC  WBC 4.0 - 10.5 K/uL 7.8  7.1  5.7   Hemoglobin 12.0 - 15.0 g/dL 11.7  11.5  11.0   Hematocrit 36.0 - 46.0 % 37.2  37.1  36.2   Platelets 150 - 400 K/uL 151  164  132       Latest Ref Rng & Units 11/13/2021   10:13 AM 11/10/2020   12:57 PM 05/03/2020    1:33 PM  CMP  Glucose 70 - 99 mg/dL 199  200  164   BUN 8 - 23 mg/dL '14  12  11   '$ Creatinine 0.44 - 1.00 mg/dL 0.80  0.78  0.73   Sodium 135 - 145 mmol/L 136  137  140   Potassium 3.5 - 5.1 mmol/L 3.8  3.8  3.8   Chloride 98 - 111 mmol/L 104  105  108    CO2 22 - 32 mmol/L '23  21  24   '$ Calcium 8.9 - 10.3 mg/dL 9.4  9.6  9.1   Total Protein 6.5 - 8.1 g/dL 7.7  7.5  6.8   Total Bilirubin 0.3 - 1.2 mg/dL 0.3  0.7  0.6   Alkaline Phos 38 - 126 U/L 115  123  86   AST 15 - 41 U/L '23  26  22   '$ ALT 0 - 44 U/L '23  31  26      '$ No results found for: "CEA1", "CEA" / No results found for: "CEA1", "CEA" No results found for: "PSA1" No results found for: "[id number]" No results found for: "CAN125"  No results found for: "TOTALPROTELP", "ALBUMINELP", "A1GS", "A2GS", "BETS", "BETA2SER", "GAMS", "MSPIKE", "SPEI" Lab Results  Component Value Date   TIBC 383 11/13/2021   TIBC 391 11/10/2020   TIBC 395 05/03/2020   FERRITIN 65 11/13/2021   FERRITIN 64 11/10/2020   FERRITIN 44 05/03/2020   IRONPCTSAT 14 11/13/2021   IRONPCTSAT 14 11/10/2020   IRONPCTSAT 11 05/03/2020   Lab Results  Component Value Date   LDH 152 11/13/2021   LDH 174 11/10/2020   LDH 186 05/03/2020     STUDIES:   No results found.

## 2022-11-19 NOTE — Patient Instructions (Addendum)
[place] at [facility] Discharge Instructions   You were seen and examined today by Dr. Delton Coombes.  He reviewed the results of your lab work which are normal/stable.   We will see you back in the clinic as needed.    Thank you for choosing [facility] [place] at [facility] to provide your oncology and hematology care.  To afford each patient quality time with our provider, please arrive at least 15 minutes before your scheduled appointment time.   If you have a lab appointment with the [facility] please come in thru the Main Entrance and check in at the main information desk.  You need to re-schedule your appointment should you arrive 10 or more minutes late.  We strive to give you quality time with our providers, and arriving late affects you and other patients whose appointments are after yours.  Also, if you no show three or more times for appointments you may be dismissed from the clinic at the providers discretion.     Again, thank you for choosing [facility].  Our hope is that these requests will decrease the amount of time that you wait before being seen by our physicians.       _____________________________________________________________  Should you have questions after your visit to [facility], please contact our office at [phone] and follow the prompts.  Our office hours are 8:00 a.m. and 4:30 p.m. Monday - Friday.  Please note that voicemails left after 4:00 p.m. may not be returned until the following business day.  We are closed weekends and major holidays.  You do have access to a nurse 24-7, just call the main number to the clinic [phone] and do not press any options, hold on the line and a nurse will answer the phone.    For prescription refill requests, have your pharmacy contact our office and allow 72 hours.    Due to Covid, you will need to wear a mask upon entering the hospital. If you do  not have a mask, a mask will be given to you at the Main Entrance upon arrival. For doctor visits, patients may have 1 support person age 49 or older with them. For treatment visits, patients can not have anyone with them due to social distancing guidelines and our immunocompromised population.

## 2022-11-27 LAB — LIPID PANEL
Chol/HDL Ratio: 2.6 ratio (ref 0.0–4.4)
Cholesterol, Total: 139 mg/dL (ref 100–199)
HDL: 53 mg/dL (ref >39)
LDL Chol Calc (NIH): 73 mg/dL (ref 0–99)
Triglycerides: 63 mg/dL (ref 0–149)
VLDL Cholesterol Cal: 13 mg/dL (ref 5–40)

## 2022-11-27 LAB — TSH: TSH: 3 u[IU]/mL (ref 0.450–4.500)

## 2022-11-27 LAB — T4, FREE: Free T4: 1.2 ng/dL (ref 0.82–1.77)

## 2022-11-27 LAB — THYROGLOBULIN LEVEL: Thyroglobulin (TG-RIA): <2 ng/mL

## 2023-02-28 NOTE — Written Directive (Cosign Needed)
MOLECULAR IMAGING AND THERAPEUTICS WRITTEN DIRECTIVE   PATIENT NAME: Rebecca Scott  PT DOB:   1955/11/28                                              MRN: [id number]  ---------------------------------------------------------------------------------------------------------------------   I-131 THYROID CANCER THERAPY   RADIOPHARMACEUTICAL:  Iodine-131 Capsule                                                  Appt: 04/17/2023    PRESCRIBED DOSE FOR ADMINISTRATION:    ROUTE OFADMINISTRATION:  PO   DIAGNOSIS: Malignant Neoplasm of Thyroid Gland   REFERRING PHYSICIAN:  Purcell Nails MD   THYROGEN STIMULATION OR HORMONE WITHDRAW: Thyrogen     REMNANT ABLATION OR ADJUVANT THERAPY:   DATE OF THYROIDECTOMY:03/10/2018   SURGEON: Franky Macho MD   TSH:   Lab Results  Component Value Date   TSH 3.000 11/12/2022   TSH 4.350 05/23/2022   TSH 0.703 11/13/2021     PRIOR I-131 THERAPY (Date and Dose): 01/29/2020   Pathology:  Cell type: []   Papillary  []   Follicular  []   [place]   Largest tumor focus:      cm  Extrathyroidal Extension?     Yes []   No []     Lymphovascular Invasion?  Yes  []   No  []     Margins positive ? Yes []   No []     Lymph nodes positive? Yes []   No  []       # positive nodes:   # negative nodes:     TNM staging: pT:         PN:         Mx:    ADDITIONAL PHYSICIAN COMMENTS/NOTES   AUTHORIZED USER SIGNATURE & TIME STAMP:

## 2023-03-06 NOTE — Written Directive (Addendum)
MOLECULAR IMAGING AND THERAPEUTICS WRITTEN DIRECTIVE   PATIENT NAME: NAZIRAH TRI  PT DOB:   1956-01-11                                              MRN: [id number]  ---------------------------------------------------------------------------------------------------------------------  I-131 WHOLE BODY SCAN    RADIOPHARMACEUTICAL: Iodine-131 Capsule for Diagnostic Imaging   PRESCRIBED DOSE FOR ADMINISTRATION:    ROUTE OFADMINISTRATION: PO   DIAGNOSIS:  Malignant Neoplasm of Thyroid Gland   REFERRING PHYSICIAN: Roma Kayser., MD   THYROGEN STIMULATION OR HORMONE WITHDRAW: THYROGEN   DATE OF THYROIDECTOMY:03/10/2018   SURGEON: Franky Macho, MD   TSH:   Lab Results  Component Value Date   TSH 3.000 11/12/2022   TSH 4.350 05/23/2022   TSH 0.703 11/13/2021     PRIOR I-131 THERAPY (Date and Dose): 01/29/2020 w/Thyrogen   ADDITIONAL PHYSICIAN COMMENTS/NOTES   AUTHORIZED USER SIGNATURE & TIME STAMP: Patriciaann Clan, MD   03/06/23    8:12 PM

## 2023-04-15 ENCOUNTER — Encounter
Admission: RE | Admit: 2023-04-15 | Discharge: 2023-04-15 | Disposition: A | Discharge status: Home / Self Care | Payer: Medicare Other | Source: Ambulatory Visit | Attending: "Endocrinology | Admitting: "Endocrinology

## 2023-04-15 DIAGNOSIS — C73 Malignant neoplasm of thyroid gland: Secondary | ICD-10-CM | POA: Insufficient documentation

## 2023-04-15 MED ORDER — THYROTROPIN ALFA 0.9 MG IM SOLR
0.9000 mg | INTRAMUSCULAR | Status: AC
  Administered 2023-04-15: 0.9 mg via INTRAMUSCULAR
  Filled 2023-04-15: qty 0.9

## 2023-04-16 ENCOUNTER — Encounter
Admission: RE | Admit: 2023-04-16 | Discharge: 2023-04-16 | Disposition: A | Discharge status: Home / Self Care | Payer: Medicare Other | Source: Ambulatory Visit | Attending: "Endocrinology | Admitting: "Endocrinology

## 2023-04-16 DIAGNOSIS — C73 Malignant neoplasm of thyroid gland: Secondary | ICD-10-CM | POA: Diagnosis not present

## 2023-04-16 MED ORDER — THYROTROPIN ALFA 0.9 MG IM SOLR
0.9000 mg | INTRAMUSCULAR | Status: AC
  Administered 2023-04-16: 0.9 mg via INTRAMUSCULAR

## 2023-04-17 ENCOUNTER — Encounter
Admission: RE | Admit: 2023-04-17 | Discharge: 2023-04-17 | Disposition: A | Discharge status: Home / Self Care | Payer: Medicare Other | Source: Ambulatory Visit | Attending: "Endocrinology | Admitting: "Endocrinology

## 2023-04-17 DIAGNOSIS — C73 Malignant neoplasm of thyroid gland: Secondary | ICD-10-CM | POA: Diagnosis not present

## 2023-04-17 DIAGNOSIS — E890 Postprocedural hypothyroidism: Secondary | ICD-10-CM | POA: Insufficient documentation

## 2023-04-17 MED ORDER — SODIUM IODIDE I 131 CAPSULE
4.4300 | Freq: Once | INTRAVENOUS | Status: AC | PRN
  Administered 2023-04-17: 4.43 via ORAL

## 2023-04-19 ENCOUNTER — Encounter
Admission: RE | Admit: 2023-04-19 | Discharge: 2023-04-19 | Disposition: A | Discharge status: Home / Self Care | Payer: Medicare Other | Source: Ambulatory Visit | Attending: "Endocrinology | Admitting: "Endocrinology

## 2023-04-19 ENCOUNTER — Other Ambulatory Visit
Admission: RE | Admit: 2023-04-19 | Discharge: 2023-04-19 | Disposition: A | Discharge status: Home / Self Care | Payer: Medicare Other | Source: Ambulatory Visit | Attending: "Endocrinology | Admitting: "Endocrinology

## 2023-04-19 DIAGNOSIS — C73 Malignant neoplasm of thyroid gland: Secondary | ICD-10-CM | POA: Diagnosis not present

## 2023-04-19 DIAGNOSIS — E890 Postprocedural hypothyroidism: Secondary | ICD-10-CM | POA: Diagnosis not present

## 2023-04-19 LAB — LIPID PANEL
Cholesterol: 136 mg/dL (ref 0–200)
HDL: 43 mg/dL (ref >40)
LDL Cholesterol: 77 mg/dL (ref 0–99)
Total CHOL/HDL Ratio: 3.2 RATIO
Triglycerides: 81 mg/dL (ref <150)
VLDL: 16 mg/dL (ref 0–40)

## 2023-04-19 LAB — TSH: TSH: 40.195 u[IU]/mL — ABNORMAL HIGH (ref 0.350–4.500)

## 2023-04-19 LAB — T4, FREE: Free T4: 1.04 ng/dL (ref 0.61–1.12)

## 2023-05-29 ENCOUNTER — Other Ambulatory Visit: Payer: Self-pay | Admitting: "Endocrinology

## 2023-06-05 ENCOUNTER — Ambulatory Visit (INDEPENDENT_AMBULATORY_CARE_PROVIDER_SITE_OTHER): Payer: Medicare Other | Admitting: "Endocrinology

## 2023-06-05 ENCOUNTER — Encounter: Payer: Self-pay | Admitting: "Endocrinology

## 2023-06-05 VITALS — BP 114/72 | HR 60 | Ht 63.0 in | Wt 213.2 lb

## 2023-06-05 DIAGNOSIS — E890 Postprocedural hypothyroidism: Secondary | ICD-10-CM

## 2023-06-05 DIAGNOSIS — C73 Malignant neoplasm of thyroid gland: Secondary | ICD-10-CM | POA: Diagnosis not present

## 2023-06-05 MED ORDER — LEVOTHYROXINE SODIUM 137 MCG PO TABS: 137.0000 ug | ORAL_TABLET | Freq: Every day | ORAL | 1 refills | Status: DC

## 2023-06-05 NOTE — Progress Notes (Signed)
Endocrinology follow-up note                                            06/05/2023, 5:58 PM   Subjective:    Patient ID: Rebecca Scott, female    DOB: [birth year], PCP Jeannette Corpus, MD   Past Medical History:  Diagnosis Date   Anemia    Anxiety    Arthritis    Asthma    seasonal asthma rarely    Chronic kidney disease    renal mass right kidney    CPAP (continuous positive airway pressure) dependence    Diverticulitis    Family history of cancer of extrahepatic bile ducts    Family history of colon cancer    Family history of kidney cancer    Family history of melanoma    GERD (gastroesophageal reflux disease)    Hemorrhoids    History of kidney stones    HTN (hypertension) 07/29/2014   Hyperlipemia    Hypertension    Migraine    no migraines now, will have stress headaches   Multinodular goiter 08/21/2017   Last Assessment & Plan:  She will need an f/u u/s of thyroid to reassess nodules. Repeat in 07/2018 Nodules are too small for biopsy now.   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/2017   s/p partial nephrectomy, right   Papillary thyroid carcinoma (HCC) 03/10/2018   PONV (postoperative nausea and vomiting)    patient reports being "slower to wake than average "    Pre-diabetes    "ive been told i'm borderline"    Sleep apnea    CPAP use    Past Surgical History:  Procedure Laterality Date   ABDOMINAL HYSTERECTOMY     APPENDECTOMY  1978   BIOPSY  07/08/2017   Procedure: BIOPSY;  Surgeon: Malissa Hippo, MD;  Location: [facility];  Service: Endoscopy;;  gastric   CHOLECYSTECTOMY  2005   COLONOSCOPY WITH PROPOFOL N/A 10/28/2017   Procedure: COLONOSCOPY WITH PROPOFOL;  Surgeon: Malissa Hippo, MD;  Location: [facility];  Service: Endoscopy;  Laterality: N/A;  7:30   EPIDURAL BLOCK INJECTION  11/09/2022   ESOPHAGOGASTRODUODENOSCOPY N/A 07/08/2017   Procedure: ESOPHAGOGASTRODUODENOSCOPY (EGD);  Surgeon: Malissa Hippo, MD;  Location: [facility];  Service: Endoscopy;  Laterality: N/A;  220   LUMBAR LAMINECTOMY/DECOMPRESSION MICRODISCECTOMY N/A 09/26/2018   Procedure: L4-5 MICRODISCECTOMY;  Surgeon: Eldred Manges, MD;  Location: [facility];  Service: Orthopedics;  Laterality: N/A;   NASAL SEPTUM SURGERY     ROBOTIC ASSITED PARTIAL NEPHRECTOMY Right 05/17/2017   Procedure: XI ROBOTIC ASSITED PARTIAL NEPHRECTOMY;  Surgeon: Sebastian Ache, MD;  Location: [facility];  Service: Urology;  Laterality: Right;   Ruptured Disk L5     SUBMANDIBULAR GLAND EXCISION  2022   THYROIDECTOMY N/A 03/10/2018   Procedure: TOTAL THYROIDECTOMY;  Surgeon: Franky Macho, MD;  Location: [facility];  Service: General;  Laterality: N/A;   Social History   Socioeconomic History   Marital status: Widowed    Spouse name: Not on file   Number of children: Not on file   Years of education: Not on file   Highest education level: Not on file  Occupational History   Not on file  Tobacco Use   Smoking status: Never   Smokeless tobacco: Never  Vaping Use  Vaping status: Never Used  Substance and Sexual Activity   Alcohol use: Yes    Comment: rarely    Drug use: No   Sexual activity: Not Currently  Other Topics Concern   Not on file  Social History Narrative   Not on file   Social Determinants of Health   Financial Resource Strain: Low Risk  (10/05/2022)   Received from [facility], [facility]   Overall Financial Resource Strain (CARDIA)    Difficulty of Paying Living Expenses: Not hard at all  Food Insecurity: No Food Insecurity (10/05/2022)   Received from [facility], [facility]   Hunger Vital Sign    Worried About Running Out of Food in the Last Year: Never true    Ran Out of Food in the Last Year: Never true  Transportation Needs: No Transportation Needs (10/05/2022)   Received from [facility], [facility]   [facility] - Transportation    Lack of Transportation (Medical): No    Lack of Transportation (Non-Medical): No   Physical Activity: Not on file  Stress: Not on file  Social Connections: Not on file   Outpatient Encounter Medications as of 06/05/2023  Medication Sig   diphenhydrAMINE (BENADRYL) 50 MG tablet Take 1 tablet (50 mg total) by mouth once for 1 dose. 1 hour prior to CT   HYDROcodone-acetaminophen (NORCO/VICODIN) 5-325 MG tablet Take by mouth every 6 (six) hours as needed.   levothyroxine (SYNTHROID) 137 MCG tablet Take 1 tablet (137 mcg total) by mouth daily before breakfast.   [START ON 06/21/2023] losartan-hydrochlorothiazide (HYZAAR) 100-12.5 MG tablet Take 1 tablet by mouth daily.   methocarbamol (ROBAXIN) 500 MG tablet Take 1 tablet (500 mg total) by mouth every 8 (eight) hours as needed for muscle spasms.   omeprazole (PRILOSEC) 20 MG capsule Take 20 mg by mouth daily as needed.   potassium chloride SA (K-DUR,KLOR-CON) 20 MEQ tablet TAKE 1 TABLET TWICE DAILY FOR 3 DAYS THEN 1 TABLET DAILY.   predniSONE (DELTASONE) 50 MG tablet Take 1 tablet 13 hrs, 7 hrs and 1 hr prior to CT   simvastatin (ZOCOR) 40 MG tablet Take 40 mg by mouth daily at 6 PM.    [DISCONTINUED] hydrochlorothiazide (HYDRODIURIL) 25 MG tablet TAKE 1/2 TABLET BY MOUTH DAILY (Patient taking differently: Take 12 mg by mouth daily.)   [DISCONTINUED] levothyroxine (SYNTHROID) 125 MCG tablet TAKE 1 TABLET BY MOUTH DAILY BEFORE BREAKFAST   [DISCONTINUED] losartan (COZAAR) 100 MG tablet TAKE 1 TABLET ONCE DAILY.   No facility-administered encounter medications on file as of 06/05/2023.   ALLERGIES: Allergies  Allergen Reactions   Iodinated Contrast Media Hives and Itching   Mango Flavor Swelling and Rash    Tongue swelling, feels like throat is closing in on her    Isovue [Iopamidol] Hives and Itching    And hives    Nitrofurantoin Hives   Other Hives, Itching, Rash and Other (See Comments)    Use Paper tape ONLY   Sulfa Antibiotics Hives   Adhesive [Tape] Rash and Other (See Comments)    Use Paper tape ONLY     VACCINATION STATUS: Immunization History  Administered Date(s) Administered   Influenza,inj,Quad PF,6+ Mos 06/06/2018   Influenza-Unspecified 06/24/2017, 06/11/2019   Moderna Sars-Covid-2 Vaccination 04/25/2020, 05/30/2020   Tdap 09/16/2018    HPI ZEENAT JICHA is 67 y.o. female who is returning for follow-up after recent near total thyroidectomy for thyroid malignancy, and postsurgical hypothyroidism.    -She  underwent near total thyroidectomy on March 10, 2018 which revealed bilateral follicular variant papillary thyroid carcinoma Pathologic Stage Classification (pTNM, AJCC 8th Edition): pT2(multifocal), pNX.  She is currently on Synthroid 112 mcg p.o. daily before breakfast.  she reports compliance.  Her previsit thyroid function tests are consistent with  Appropriate replacement.  Her serum thyroglobulin level is still pending.  - She denies dysphagia, odynophagia, voice change, nor shortness of breath.  Prior to this visit on September 15, 2020 she underwent thyroid/neck ultrasound: Small teardrop focus of residual thyroid tissue in the left resection.  Measuring 1.2 x 0.8 cm with no evidence of nodularity.  1.2 cm complex cyst within the left submandibular gland with differentials including cyst, focal ductal ectasia, abscess, less likely cystic neoplasm.   She underwent fine-needle aspiration biopsy prior to this visit which was negative for malignancy.  She underwent surgical excision :  Diagnosis    A: Submandibular gland, left, excision - Submandibular gland with benign salivary gland cyst (1.2 cm) involved by mixed acute and chronic inflammation - Cyst is completely excised - Negative for malignancy   She underwent surveillance Thyrogen stimulated whole-body scan on April 19, 2023 which showed no evidence of distant metastatic thyroid carcinoma, however asymmetric activity in the right neck which favored asymmetric salivary gland tissue on the right .  -Note that her  previous abnormalities on the left submandibular gland for which she underwent excision of 1.2 cm salivary gland cyst negative for malignancy.   Review of Systems Limited as above.  Objective:    BP 114/72   Pulse 60   Ht 5\' 3"  (1.6 m)   Wt 213 lb 3.2 oz (96.7 kg)   BMI 37.77 kg/m   Wt Readings from Last 3 Encounters:  06/05/23 213 lb 3.2 oz (96.7 kg)  11/19/22 216 lb (98 kg)  11/19/22 216 lb 11.2 oz (98.3 kg)     Diabetic Labs (most recent): Lab Results  Component Value Date   HGBA1C 5.1 09/08/2018   HGBA1C 5.4 03/06/2018   HGBA1C 5.7 (H) 05/09/2017     Recent Results (from the past 2160 hour(s))  Thyroglobulin antibody     Status: None   Collection Time: 04/19/23 11:16 AM  Result Value Ref Range   Thyroglobulin Antibody <1.0 0.0 - 0.9 IU/mL    Comment: (NOTE) Thyroglobulin Antibody measured by [employer] Methodology It should be noted that the presence of thyroglobulin antibodies may not be pathogenic nor diagnostic, especially at very low levels. The assay manufacturer has found that four percent of individuals without evidence of thyroid disease or autoimmunity will have positive TgAb levels up to 4 IU/mL. Performed At: [facility] [address] [place], [place] [id number] Jolene Schimke MD WU:[id number]   Lipid panel     Status: None   Collection Time: 04/19/23 11:16 AM  Result Value Ref Range   Cholesterol 136 0 - 200 mg/dL   Triglycerides 81 <956 mg/dL   HDL 43 >21 mg/dL   Total CHOL/HDL Ratio 3.2 RATIO   VLDL 16 0 - 40 mg/dL   LDL Cholesterol 77 0 - 99 mg/dL    Comment:        Total Cholesterol/HDL:CHD Risk Coronary Heart Disease Risk Table                     Men   Women  1/2 Average Risk   3.4   3.3  Average Risk       5.0   4.4  2 X Average Risk   9.6   7.1  3 X Average Risk  23.4   11.0        Use the calculated Patient Ratio above and the CHD Risk Table to determine the patient's CHD Risk.        ATP III CLASSIFICATION  (LDL):  <100     mg/dL   Optimal  161-096  mg/dL   Near or Above                    Optimal  130-159  mg/dL   Borderline  045-409  mg/dL   High  >811     mg/dL   Very High Performed at [facility], [address] [place], [place] [address]   T4, free     Status: None   Collection Time: 04/19/23 11:16 AM  Result Value Ref Range   Free T4 1.04 0.61 - 1.12 ng/dL    Comment: (NOTE) Biotin ingestion may interfere with free T4 tests. If the results are inconsistent with the TSH level, previous test results, or the clinical presentation, then consider biotin interference. If needed, order repeat testing after stopping biotin. Performed at [facility], [address] [place], [place] [address]   TSH     Status: Abnormal   Collection Time: 04/19/23 11:16 AM  Result Value Ref Range   TSH 40.195 (H) 0.350 - 4.500 uIU/mL    Comment: Performed by a 3rd Generation assay with a functional sensitivity of <=0.01 uIU/mL. Performed at [facility], [address] [place], [place] [address]        Diagnosis 1. Thyroid, lobectomy, left lobe and isthmus - PAPILLARY THYROID CARCINOMA, FOLLICULAR VARIANT, 1.3 CM. - TUMOR CONFINED WITHIN THYROID CAPSULE. - MARGINS NOT INVOLVED. 2. Thyroid, lobectomy, right lobe - PAPILLARY THYROID CARCINOMA, FOLLICULAR VARIANT, 2.6 CM. - TUMOR FOCALLY LESS THAN 0.1 CM FROM MARGIN.   Pathologic Stage Classification (pTNM, AJCC 8th Edition): pT2(multifocal), pNX.   Thyrogen stimulated whole-body scan on June 23, 2018 FINDINGS: Radio iodine accumulation is seen at the thyroid bed bilaterally consistent with thyroid remnant. No distant sites of abnormal radio iodine accumulation are identified to suggest iodine-avid metastatic thyroid cancer.   IMPRESSION: Thyroid remnant.  No scintigraphic evidence of iodine-avid thyroid cancer metastases.   Thyroid/neck ultrasound for Jan 13, 2019- consistent with surgical changes with  absence of residual or recurrent thyroid disease.     Most recent Thyrogen stimulated whole-body scan on Feb 01, 2020 FINDINGS: A single small focus of increased radiotracer uptake within the lower left neck.   Normal physiologic uptake identified within the oropharynx and GI tract.   IMPRESSION: Single, small focus of increased radiotracer uptake noted within the left lower lobe neck worrisome for residual/recurrent functioning thyroid tissue.    CT scan of neck on March 25, 2020: IMPRESSION: No mass or adenopathy. No definite finding corresponding to area of increased uptake on nuclear study.  Thyroid/neck ultrasound on September 15, 2020 IMPRESSION: 1. Surgical changes of prior thyroid resection. 2. Small teardrop shaped focus of residual thyroid tissue in the left resection bed measuring 0.8 x 1.2 x 0.6 cm. No evidence of thyroid nodule. 3. Complex cyst within the left submandibular gland measures up to 1.2 x 0.8 x 0.8 cm. Differential considerations include cyst, focal ductal ectasia, abscess, and less likely, cystic neoplasm. Consider repeat left submandibular ultrasound in 3-6 months to assess for Stability.   May, 18 2022 Thyroid/neck surveillance  ultrasound  IMPRESSION: 1. Surgical changes of total thyroidectomy. 2. Decreasing conspicuity of small focus of probable residual thyroid tissue in the left resection bed. No evidence of thyroid nodule. 3. Slight interval enlargement of complex cyst within the left submandibular gland now measuring up to 1.5 cm compared to 1.2 cm previously. Given slight interval enlargement, consider fine-needle aspiration biopsy.  Fine-needle aspiration biopsy on March 09, 2021 at [facility]: Left submandibular gland fine-needle aspiration - No malignant cells identified - Hypocellular specimen consistent with benign cyst fluid.  June 06, 2021 surgical excision of this lesion at [facility]  Diagnosis    A: Submandibular  gland, left, excision - Submandibular gland with benign salivary gland cyst (1.2 cm) involved by mixed acute and chronic inflammation - Cyst is completely excised - Negative for malignancy    Surveillance Thyrogen stimulated whole-body scan on April 19, 2023: IMPRESSION: 1. Asymmetric activity in the RIGHT neck as described above. Favor asymmetric salivary gland tissue. Recommend correlation with thyroglobulin levels. If elevated, recommend ultrasound evaluation of the neck. 2. No evidence of distant metastatic thyroid carcinoma.  Assessment & Plan:   1.  Follicular variant of papillary thyroid cancer  -She is status post negative her thyroidectomy on March 10, 2018 with Dr. Franky Macho resulting in  Pathologic Stage Classification (pTNM, AJCC 8th Edition): pT2(multifocal), pNX.  -She he is status post radioactive iodine remnant ablation followed by whole body scan which was completed on June 24, 2018 at [facility].  -The whole body scan revealed some thyroid remnant in the thyroid bed, no evidence of distant metastasis.  Her previsit thyroid/neck ultrasound is consistent with surgical changes of absent residual or recurrent thyroid disease. -She willd not need any further intervention at this time, -I had a long discussion with her regarding the need for continued monitoring with yearly imaging studies for next 5-10 years.  -Her recent Thyrogen stimulated whole-body scan showed single, small focus of increased radiotracer uptake within the left lower lobe worrisome for residual/recurrent functioning thyroid tissue.    -Her previsit CT neck/thyroid was unremarkable-no mass or adenopathy.  No definitive finding corresponding to the area of increased uptake on nuclear study.    September 15, 2020 thyroid/neck ultrasound confirming possible residual thyroid tissue measuring 1.2 cm on the left thyroid bed, and 1.2 cm complex cyst within the left submandibular gland differentials  include ductal ectasia, abscess, cyst, or less likely cystic neoplasm.  -surgical excision : benign cyst, submandibular gland.  Prior to her last visit, thyroglobulin was undetectable.  Her previsit thyroglobulin level is still pending.    -Her previsit Thyrogen stimulated whole-body scan did not show evidence of distant metastatic thyroid carcinoma, however abnormal tracer finding on the right submandibular area.    -The description favors salivary gland.  She will be offered repeat assessment with thyroid ultrasound before her next visit in 6 months.   2.  Postsurgical hypothyroidism  -Her previsit thyroid function tests are such that she would benefit from slight increase in her levothyroxine.  I discussed and increase her levothyroxine to 137 mcg p.o. daily before breakfast.     - We discussed about the correct intake of her thyroid hormone, on empty stomach at fasting, with water, separated by at least 30 minutes from breakfast and other medications,  and separated by more than 4 hours from calcium, iron, multivitamins, acid reflux medications (PPIs). -Patient is made aware of the fact that thyroid hormone replacement is needed for life, dose to be adjusted by periodic  monitoring of thyroid function tests.   - I advised her  to maintain close follow up with Jeannette Corpus, MD for primary care needs.   I spent  25  minutes in the care of the patient today including review of labs from Thyroid Function, her new and previous thyroid/neck imaging/biopsy records (current and previous including abstractions from other facilities); face-to-face time discussing  her lab results and symptoms, medications doses, her options of short and long term treatment based on the latest standards of care / guidelines;   and documenting the encounter.  Rebecca Scott  participated in the discussions, expressed understanding, and voiced agreement with the above plans.  All questions were answered to her  satisfaction. she is encouraged to contact clinic should she have any questions or concerns prior to her return visit.   Follow up plan: Return in about 6 months (around 12/03/2023) for F/U with Pre-visit Labs, Thyroid / Neck Ultrasound.   Marquis Lunch, MD [facility] [address] [place], [place] [address] Phone: [phone]  Fax: [phone]     06/05/2023, 5:58 PM  This note was partially dictated with voice recognition software. Similar sounding words can be transcribed inadequately or may not  be corrected upon review.

## 2023-11-25 ENCOUNTER — Other Ambulatory Visit
Admission: RE | Admit: 2023-11-25 | Discharge: 2023-11-25 | Disposition: A | Discharge status: Home / Self Care | Source: Ambulatory Visit | Attending: "Endocrinology | Admitting: "Endocrinology

## 2023-11-25 ENCOUNTER — Ambulatory Visit
Admission: RE | Admit: 2023-11-25 | Discharge: 2023-11-25 | Disposition: A | Discharge status: Home / Self Care | Payer: Medicare Other | Source: Ambulatory Visit | Attending: "Endocrinology | Admitting: "Endocrinology

## 2023-11-25 DIAGNOSIS — C73 Malignant neoplasm of thyroid gland: Secondary | ICD-10-CM | POA: Diagnosis present

## 2023-11-25 LAB — TSH: TSH: 0.053 u[IU]/mL — ABNORMAL LOW (ref 0.350–4.500)

## 2023-11-25 LAB — T4, FREE: Free T4: 1.16 ng/dL — ABNORMAL HIGH (ref 0.61–1.12)

## 2023-11-27 LAB — TGAB+THYROGLOBULIN IMA OR LCMS: Thyroglobulin Antibody: <1 [IU]/mL (ref 0.0–0.9)

## 2023-11-27 LAB — THYROGLOBULIN BY IMA: Thyroglobulin by IMA: <0.1 ng/mL — ABNORMAL LOW (ref 1.5–38.5)

## 2023-12-03 ENCOUNTER — Ambulatory Visit (INDEPENDENT_AMBULATORY_CARE_PROVIDER_SITE_OTHER): Payer: Medicare Other | Admitting: "Endocrinology

## 2023-12-03 ENCOUNTER — Encounter: Payer: Self-pay | Admitting: "Endocrinology

## 2023-12-03 VITALS — BP 104/72 | HR 64 | Ht 63.0 in | Wt 199.6 lb

## 2023-12-03 DIAGNOSIS — C73 Malignant neoplasm of thyroid gland: Secondary | ICD-10-CM

## 2023-12-03 DIAGNOSIS — E890 Postprocedural hypothyroidism: Secondary | ICD-10-CM | POA: Diagnosis not present

## 2023-12-03 MED ORDER — LEVOTHYROXINE SODIUM 125 MCG PO TABS: 125.0000 ug | ORAL_TABLET | Freq: Every day | ORAL | 1 refills | Status: DC

## 2023-12-03 NOTE — Progress Notes (Signed)
 Endocrinology follow-up note                                            12/03/2023, 5:15 PM   Subjective:    Patient ID: Rebecca Scott, female    DOB: [birth year], PCP Jeannette Corpus, MD   Past Medical History:  Diagnosis Date   Anemia    Anxiety    Arthritis    Asthma    seasonal asthma rarely    Chronic kidney disease    renal mass right kidney    CPAP (continuous positive airway pressure) dependence    Diverticulitis    Family history of cancer of extrahepatic bile ducts    Family history of colon cancer    Family history of kidney cancer    Family history of melanoma    GERD (gastroesophageal reflux disease)    Hemorrhoids    History of kidney stones    HTN (hypertension) 07/29/2014   Hyperlipemia    Hypertension    Migraine    no migraines now, will have stress headaches   Multinodular goiter 08/21/2017   Last Assessment & Plan:  She will need an f/u u/s of thyroid to reassess nodules. Repeat in 07/2018 Nodules are too small for biopsy now.   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/2017   s/p partial nephrectomy, right   Papillary thyroid carcinoma (HCC) 03/10/2018   PONV (postoperative nausea and vomiting)    patient reports being "slower to wake than average "    Pre-diabetes    "ive been told i'm borderline"    Sleep apnea    CPAP use    Past Surgical History:  Procedure Laterality Date   ABDOMINAL HYSTERECTOMY     APPENDECTOMY  1978   BIOPSY  07/08/2017   Procedure: BIOPSY;  Surgeon: Malissa Hippo, MD;  [facility]: [facility];  Service: Endoscopy;;  gastric   CHOLECYSTECTOMY  2005   COLONOSCOPY WITH PROPOFOL N/A 10/28/2017   Procedure: COLONOSCOPY WITH PROPOFOL;  Surgeon: Malissa Hippo, MD;  Location: [facility];  Service: Endoscopy;  Laterality: N/A;  7:30   EPIDURAL BLOCK INJECTION  11/09/2022   ESOPHAGOGASTRODUODENOSCOPY N/A 07/08/2017   Procedure: ESOPHAGOGASTRODUODENOSCOPY (EGD);  Surgeon: Malissa Hippo, MD;  Location: [facility];  Service: Endoscopy;  Laterality: N/A;  220   LUMBAR LAMINECTOMY/DECOMPRESSION MICRODISCECTOMY N/A 09/26/2018   Procedure: L4-5 MICRODISCECTOMY;  Surgeon: Eldred Manges, MD;  Location: [facility];  Service: Orthopedics;  Laterality: N/A;   NASAL SEPTUM SURGERY     ROBOTIC ASSITED PARTIAL NEPHRECTOMY Right 05/17/2017   Procedure: XI ROBOTIC ASSITED PARTIAL NEPHRECTOMY;  Surgeon: Sebastian Ache, MD;  Location: [facility];  Service: Urology;  Laterality: Right;   Ruptured Disk L5     SUBMANDIBULAR GLAND EXCISION  2022   THYROIDECTOMY N/A 03/10/2018   Procedure: TOTAL THYROIDECTOMY;  Surgeon: Franky Macho, MD;  Location: [facility];  Service: General;  Laterality: N/A;   Social History   Socioeconomic History   Marital status: Widowed    Spouse name: Not on file   Number of children: Not on file   Years of education: Not on file   Highest education level: Not on file  Occupational History   Not on file  Tobacco Use   Smoking status: Never   Smokeless tobacco: Never  Vaping Use  Vaping status: Never Used  Substance and Sexual Activity   Alcohol use: Yes    Comment: rarely    Drug use: No   Sexual activity: Not Currently  Other Topics Concern   Not on file  Social History Narrative   Not on file   Social Drivers of Health   Financial Resource Strain: Low Risk  (10/05/2022)   Received from [facility], [facility]   Overall Financial Resource Strain (CARDIA)    Difficulty of Paying Living Expenses: Not hard at all  Food Insecurity: No Food Insecurity (10/05/2022)   Received from [facility], [facility]   Hunger Vital Sign    Worried About Running Out of Food in the Last Year: Never true    Ran Out of Food in the Last Year: Never true  Transportation Needs: No Transportation Needs (10/05/2022)   Received from [facility], [facility]   [facility] - Transportation    Lack of Transportation (Medical): No    Lack of Transportation (Non-Medical): No   Physical Activity: Not on file  Stress: Not on file  Social Connections: Not on file   Outpatient Encounter Medications as of 12/03/2023  Medication Sig   metFORMIN (GLUCOPHAGE) 500 MG tablet Take 500 mg by mouth 2 (two) times daily with a meal.   Semaglutide,0.25 or 0.5MG /DOS, (OZEMPIC, 0.25 OR 0.5 MG/DOSE,) 2 MG/3ML SOPN Inject 0.5 mg into the skin once a week.   diphenhydrAMINE (BENADRYL) 50 MG tablet Take 1 tablet (50 mg total) by mouth once for 1 dose. 1 hour prior to CT   HYDROcodone-acetaminophen (NORCO/VICODIN) 5-325 MG tablet Take by mouth every 6 (six) hours as needed.   levothyroxine (SYNTHROID) 125 MCG tablet Take 1 tablet (125 mcg total) by mouth daily before breakfast.   losartan-hydrochlorothiazide (HYZAAR) 100-12.5 MG tablet Take 1 tablet by mouth daily.   methocarbamol (ROBAXIN) 500 MG tablet Take 1 tablet (500 mg total) by mouth every 8 (eight) hours as needed for muscle spasms.   omeprazole (PRILOSEC) 20 MG capsule Take 20 mg by mouth daily as needed.   potassium chloride SA (K-DUR,KLOR-CON) 20 MEQ tablet TAKE 1 TABLET TWICE DAILY FOR 3 DAYS THEN 1 TABLET DAILY.   predniSONE (DELTASONE) 50 MG tablet Take 1 tablet 13 hrs, 7 hrs and 1 hr prior to CT   simvastatin (ZOCOR) 40 MG tablet Take 40 mg by mouth daily at 6 PM.    [DISCONTINUED] levothyroxine (SYNTHROID) 137 MCG tablet Take 1 tablet (137 mcg total) by mouth daily before breakfast.   No facility-administered encounter medications on file as of 12/03/2023.   ALLERGIES: Allergies  Allergen Reactions   Iodinated Contrast Media Hives and Itching   Mango Flavoring Agent (Non-Screening) Swelling and Rash    Tongue swelling, feels like throat is closing in on her    Isovue [Iopamidol] Hives and Itching    And hives    Nitrofurantoin Hives   Other Hives, Itching, Rash and Other (See Comments)    Use Paper tape ONLY   Sulfa Antibiotics Hives   Adhesive [Tape] Rash and Other (See Comments)    Use Paper tape ONLY     VACCINATION STATUS: Immunization History  Administered Date(s) Administered   Influenza,inj,Quad PF,6+ Mos 06/06/2018   Influenza-Unspecified 06/24/2017, 06/11/2019   Moderna Sars-Covid-2 Vaccination 04/25/2020, 05/30/2020   Tdap 09/16/2018    HPI Rebecca Scott is 68 y.o. female who is returning for follow-up after recent near total thyroidectomy for thyroid malignancy,  and postsurgical hypothyroidism.    -She underwent near total thyroidectomy on March 10, 2018 which revealed bilateral follicular variant papillary thyroid carcinoma Pathologic Stage Classification (pTNM, AJCC 8th Edition): pT2(multifocal), pNX.  She is currently on Synthroid 137 mcg p.o. daily before breakfast.  she reports compliance.  Her previsit thyroid function tests are consistent with slight over replacement. Her previsit labs show undetectable thyroglobulin. - She denies dysphagia, odynophagia, voice change, nor shortness of breath.  Prior to this visit on September 15, 2020 she underwent thyroid/neck ultrasound: Small teardrop focus of residual thyroid tissue in the left resection.  Measuring 1.2 x 0.8 cm with no evidence of nodularity.  1.2 cm complex cyst within the left submandibular gland with differentials including cyst, focal ductal ectasia, abscess, less likely cystic neoplasm.   She underwent fine-needle aspiration biopsy prior to this visit which was negative for malignancy.  She underwent surgical excision :  Diagnosis    A: Submandibular gland, left, excision - Submandibular gland with benign salivary gland cyst (1.2 cm) involved by mixed acute and chronic inflammation - Cyst is completely excised - Negative for malignancy   She underwent surveillance Thyrogen stimulated whole-body scan on April 19, 2023 which showed no evidence of distant metastatic thyroid carcinoma, however asymmetric activity in the right neck which favored asymmetric salivary gland tissue on the right .  -Note that her  previous abnormalities on the left submandibular gland for which she underwent excision of 1.2 cm salivary gland cyst negative for malignancy.  She underwent previsit thyroid/neck ultrasound with some findings in the right upper thyroid area.  See below.   Review of Systems Limited as above.  Objective:    BP 104/72   Pulse 64   Ht 5\' 3"  (1.6 m)   Wt 199 lb 9.6 oz (90.5 kg)   BMI 35.36 kg/m   Wt Readings from Last 3 Encounters:  12/03/23 199 lb 9.6 oz (90.5 kg)  06/05/23 213 lb 3.2 oz (96.7 kg)  11/19/22 216 lb (98 kg)     Diabetic Labs (most recent): Lab Results  Component Value Date   HGBA1C 5.1 09/08/2018   HGBA1C 5.4 03/06/2018   HGBA1C 5.7 (H) 05/09/2017     Recent Results (from the past 2160 hours)  TSH     Status: Abnormal   Collection Time: 11/25/23 11:20 AM  Result Value Ref Range   TSH 0.053 (L) 0.350 - 4.500 uIU/mL    Comment: Performed by a 3rd Generation assay with a functional sensitivity of <=0.01 uIU/mL. Performed at [facility], [address] [place], [place] [address]   T4, free     Status: Abnormal   Collection Time: 11/25/23 11:20 AM  Result Value Ref Range   Free T4 1.16 (H) 0.61 - 1.12 ng/dL    Comment: (NOTE) Biotin ingestion may interfere with free T4 tests. If the results are inconsistent with the TSH level, previous test results, or the clinical presentation, then consider biotin interference. If needed, order repeat testing after stopping biotin. Performed at [facility], [address] [place], [place] [address]   TgAb+Thyroglobulin IMA or LCMS     Status: None   Collection Time: 11/25/23 11:20 AM  Result Value Ref Range   Thyroglobulin Antibody <1.0 0.0 - 0.9 IU/mL    Comment: (NOTE) Thyroglobulin Antibody measured by [employer] Methodology It should be noted that the presence of thyroglobulin antibodies may not be pathogenic nor diagnostic, especially at very low levels. The assay manufacturer has  found that four percent of individuals without evidence of thyroid disease or autoimmunity will have positive TgAb levels up to 4 IU/mL. Performed At: [facility] [address] [place], [place] [id number] Jolene Schimke MD NW:[id number]   Thyroglobulin by IMA     Status: Abnormal   Collection Time: 11/25/23 11:20 AM  Result Value Ref Range   Thyroglobulin by IMA <0.1 (L) 1.5 - 38.5 ng/mL    Comment: (NOTE) According to the [facility] of Clinical Biochemistry, the reference interval for Thyroglobulin (TG) should be related to euthyroid patients and not for patients who underwent thyroidectomy. TG reference intervals for these patients depend on the residual mass of the thyroid tissue left after surgery. Establishing a post-operative baseline is recommended. The assay limit of quantitation is 0.1 ng/mL Thyroglobulin measured by [facility] Immunometric Assay Performed At: [facility] [address] [place], [place] [id number] Jolene Schimke MD BM:[id number]        Diagnosis 1. Thyroid, lobectomy, left lobe and isthmus - PAPILLARY THYROID CARCINOMA, FOLLICULAR VARIANT, 1.3 CM. - TUMOR CONFINED WITHIN THYROID CAPSULE. - MARGINS NOT INVOLVED. 2. Thyroid, lobectomy, right lobe - PAPILLARY THYROID CARCINOMA, FOLLICULAR VARIANT, 2.6 CM. - TUMOR FOCALLY LESS THAN 0.1 CM FROM MARGIN.   Pathologic Stage Classification (pTNM, AJCC 8th Edition): pT2(multifocal), pNX.   Thyrogen stimulated whole-body scan on June 23, 2018 FINDINGS: Radio iodine accumulation is seen at the thyroid bed bilaterally consistent with thyroid remnant. No distant sites of abnormal radio iodine accumulation are identified to suggest iodine-avid metastatic thyroid cancer.   IMPRESSION: Thyroid remnant.  No scintigraphic evidence of iodine-avid thyroid cancer metastases.   Thyroid/neck ultrasound for Jan 13, 2019- consistent with surgical changes with absence of residual or  recurrent thyroid disease.     Most recent Thyrogen stimulated whole-body scan on Feb 01, 2020 FINDINGS: A single small focus of increased radiotracer uptake within the lower left neck.   Normal physiologic uptake identified within the oropharynx and GI tract.   IMPRESSION: Single, small focus of increased radiotracer uptake noted within the left lower lobe neck worrisome for residual/recurrent functioning thyroid tissue.    CT scan of neck on March 25, 2020: IMPRESSION: No mass or adenopathy. No definite finding corresponding to area of increased uptake on nuclear study.  Thyroid/neck ultrasound on September 15, 2020 IMPRESSION: 1. Surgical changes of prior thyroid resection. 2. Small teardrop shaped focus of residual thyroid tissue in the left resection bed measuring 0.8 x 1.2 x 0.6 cm. No evidence of thyroid nodule. 3. Complex cyst within the left submandibular gland measures up to 1.2 x 0.8 x 0.8 cm. Differential considerations include cyst, focal ductal ectasia, abscess, and less likely, cystic neoplasm. Consider repeat left submandibular ultrasound in 3-6 months to assess for Stability.   May, 18 2022 Thyroid/neck surveillance  ultrasound IMPRESSION: 1. Surgical changes of total thyroidectomy. 2. Decreasing conspicuity of small focus of probable residual thyroid tissue in the left resection bed. No evidence of thyroid nodule. 3. Slight interval enlargement of complex cyst within the left submandibular gland now measuring up to 1.5 cm compared to 1.2 cm previously. Given slight interval enlargement, consider fine-needle aspiration biopsy.  Fine-needle aspiration biopsy on March 09, 2021 at [facility] healthcare: Left submandibular gland fine-needle aspiration - No malignant cells identified - Hypocellular specimen consistent with benign cyst fluid.  June 06, 2021 surgical excision of this lesion at [facility]  Diagnosis    A: Submandibular gland, left, excision -  Submandibular gland with benign salivary gland  cyst (1.2 cm) involved by mixed acute and chronic inflammation - Cyst is completely excised - Negative for malignancy    Surveillance Thyrogen stimulated whole-body scan on April 19, 2023: IMPRESSION: 1. Asymmetric activity in the RIGHT neck as described above. Favor asymmetric salivary gland tissue. Recommend correlation with thyroglobulin levels. If elevated, recommend ultrasound evaluation of the neck. 2. No evidence of distant metastatic thyroid carcinoma.  Thyroid/neck surveillance ultrasound on November 25, 2023 IMPRESSION: 1. Evaluation of the right submandibular space demonstrates a focus of homogeneous echogenic soft tissue measuring 2.2 x 0.9 x 2.1 cm medial to the submandibular gland. Findings suggest a focus of recurrent thyroid tissue. No nodularity or microcalcifications. 2. Surgical changes of total thyroidectomy without evidence of residual or recurrent disease in the resection bed. 3. No suspicious lymphadenopathy.  Assessment & Plan:   1.  Follicular variant of papillary thyroid cancer  -She is status post negative her thyroidectomy on March 10, 2018 with Dr. Franky Macho resulting in  Pathologic Stage Classification (pTNM, AJCC 8th Edition): pT2(multifocal), pNX.  -She he is status post radioactive iodine remnant ablation followed by whole body scan which was completed on June 24, 2018 at [facility].  -The whole body scan revealed some thyroid remnant in the thyroid bed, no evidence of distant metastasis.  Her previsit thyroid/neck ultrasound is consistent with surgical changes of absent residual or recurrent thyroid disease. -She willd not need any further intervention at this time, -I had a long discussion with her regarding the need for continued monitoring with yearly imaging studies for next 5-10 years.  -Her recent Thyrogen stimulated whole-body scan showed single, small focus of increased radiotracer  uptake within the left lower lobe worrisome for residual/recurrent functioning thyroid tissue.    -Her previsit CT neck/thyroid was unremarkable-no mass or adenopathy.  No definitive finding corresponding to the area of increased uptake on nuclear study.    September 15, 2020 thyroid/neck ultrasound confirming possible residual thyroid tissue measuring 1.2 cm on the left thyroid bed, and 1.2 cm complex cyst within the left submandibular gland differentials include ductal ectasia, abscess, cyst, or less likely cystic neoplasm.  -surgical excision : benign cyst, submandibular gland.  Prior to her last visit, thyroglobulin was undetectable.  Her previsit thyroglobulin level is still pending.    -Her previsit Thyrogen stimulated whole-body scan did not show evidence of distant metastatic thyroid carcinoma, however abnormal tracer finding on the right submandibular area.    -The description favored salivary gland on the whole-body scan.  She was offered repeat surveillance ultrasound which showed 2.2 cm possible thyroid remnant.   Thyroglobulin is undetectable.  She was offered options of proceeding for excisional biopsy or repeat imaging with CT scan in 3 months.  She opted into waiting and imaging with CT scan in 3 months.   2.  Postsurgical hypothyroidism  -Her previsit thyroid function tests are consistent with slight over replacement.  I discussed and lowered her levothyroxine to 125 mcg p.o. daily before breakfast.     - We discussed about the correct intake of her thyroid hormone, on empty stomach at fasting, with water, separated by at least 30 minutes from breakfast and other medications,  and separated by more than 4 hours from calcium, iron, multivitamins, acid reflux medications (PPIs). -Patient is made aware of the fact that thyroid hormone replacement is needed for life, dose to be adjusted by periodic monitoring of thyroid function tests.   - I advised her  to maintain close follow up  with Jeannette Corpus, MD for primary care needs.   I spent  25  minutes in the care of the patient today including review of labs from Thyroid Function, CMP, and other relevant labs ; imaging/biopsy records (current and previous including abstractions from other facilities); face-to-face time discussing  her lab results and symptoms, medications doses, her options of short and long term treatment based on the latest standards of care / guidelines;   and documenting the encounter.  Rebecca Scott  participated in the discussions, expressed understanding, and voiced agreement with the above plans.  All questions were answered to her satisfaction. she is encouraged to contact clinic should she have any questions or concerns prior to her return visit.    Follow up plan: Return in about 3 months (around 03/04/2024), or and CT B4 next visit, for Fasting Labs  in AM B4 8.   Marquis Lunch, MD [facility] [address] [place], [place] [address] Phone: [phone]  Fax: [phone]     12/03/2023, 5:15 PM  This note was partially dictated with voice recognition software. Similar sounding words can be transcribed inadequately or may not  be corrected upon review.

## 2023-12-04 ENCOUNTER — Encounter: Payer: Self-pay | Admitting: "Endocrinology

## 2023-12-04 ENCOUNTER — Other Ambulatory Visit: Payer: Self-pay | Admitting: "Endocrinology

## 2023-12-04 DIAGNOSIS — Z91041 Radiographic dye allergy status: Secondary | ICD-10-CM

## 2023-12-04 MED ORDER — PREDNISONE 50 MG PO TABS
ORAL_TABLET | ORAL | 0 refills | Status: AC
Start: 2023-12-04 — End: ?

## 2023-12-04 MED ORDER — DIPHENHYDRAMINE HCL 50 MG PO TABS: 50.0000 mg | ORAL_TABLET | Freq: Once | ORAL | 0 refills | Status: AC

## 2023-12-05 ENCOUNTER — Other Ambulatory Visit: Payer: Self-pay | Admitting: "Endocrinology

## 2024-01-20 HISTORY — PX: HERNIA REPAIR: SHX51

## 2024-02-19 ENCOUNTER — Other Ambulatory Visit
Admission: RE | Admit: 2024-02-19 | Discharge: 2024-02-19 | Disposition: A | Discharge status: Home / Self Care | Attending: "Endocrinology | Admitting: "Endocrinology

## 2024-02-19 DIAGNOSIS — E890 Postprocedural hypothyroidism: Secondary | ICD-10-CM | POA: Insufficient documentation

## 2024-02-19 LAB — TSH: TSH: 0.057 u[IU]/mL — ABNORMAL LOW (ref 0.350–4.500)

## 2024-02-19 LAB — T4, FREE: Free T4: 1.16 ng/dL — ABNORMAL HIGH (ref 0.61–1.12)

## 2024-02-24 ENCOUNTER — Ambulatory Visit
Admission: RE | Admit: 2024-02-24 | Discharge: 2024-02-24 | Disposition: A | Discharge status: Home / Self Care | Source: Ambulatory Visit | Attending: "Endocrinology | Admitting: "Endocrinology

## 2024-02-24 DIAGNOSIS — E890 Postprocedural hypothyroidism: Secondary | ICD-10-CM | POA: Insufficient documentation

## 2024-02-24 LAB — POCT I-STAT CREATININE: Creatinine, Ser: 0.8 mg/dL (ref 0.44–1.00)

## 2024-02-24 MED ORDER — IOHEXOL 300 MG/ML  SOLN
75.0000 mL | Freq: Once | INTRAMUSCULAR | Status: AC | PRN
  Administered 2024-02-24: 75 mL via INTRAVENOUS

## 2024-03-01 LAB — MISC LABCORP TEST (SEND OUT): Labcorp test code: 500001

## 2024-03-05 ENCOUNTER — Encounter: Payer: Self-pay | Admitting: "Endocrinology

## 2024-03-05 ENCOUNTER — Ambulatory Visit (INDEPENDENT_AMBULATORY_CARE_PROVIDER_SITE_OTHER): Admitting: "Endocrinology

## 2024-03-05 VITALS — BP 114/68 | HR 68 | Ht 63.0 in | Wt 196.0 lb

## 2024-03-05 DIAGNOSIS — C73 Malignant neoplasm of thyroid gland: Secondary | ICD-10-CM

## 2024-03-05 DIAGNOSIS — E890 Postprocedural hypothyroidism: Secondary | ICD-10-CM

## 2024-03-05 MED ORDER — LEVOTHYROXINE SODIUM 112 MCG PO TABS: 112.0000 ug | ORAL_TABLET | Freq: Every day | ORAL | 1 refills | Status: DC

## 2024-03-05 NOTE — Progress Notes (Signed)
 Endocrinology follow-up note                                            03/05/2024, 4:44 PM   Subjective:    Patient ID: Rebecca Scott, female    DOB: 16-Jan-1956, PCP Devora Sherrilyn Stagger, MD   Past Medical History:  Diagnosis Date   Anemia    Anxiety    Arthritis    Asthma    seasonal asthma rarely    Chronic kidney disease    renal mass right kidney    CPAP (continuous positive airway pressure) dependence    Diverticulitis    Family history of cancer of extrahepatic bile ducts    Family history of colon cancer    Family history of kidney cancer    Family history of melanoma    GERD (gastroesophageal reflux disease)    Hemorrhoids    History of kidney stones    HTN (hypertension) 07/29/2014   Hyperlipemia    Hypertension    Migraine    no migraines now, will have stress headaches   Multinodular goiter 08/21/2017   Last Assessment & Plan:  She will need an f/u u/s of thyroid  to reassess nodules. Repeat in 07/2018 Nodules are too small for biopsy now.   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/2017   s/p partial nephrectomy, right   Papillary thyroid  carcinoma (HCC) 03/10/2018   PONV (postoperative nausea and vomiting)    patient reports being slower to wake than average     Pre-diabetes    ive been told i'm borderline    Sleep apnea    CPAP use    Past Surgical History:  Procedure Laterality Date   ABDOMINAL HYSTERECTOMY     APPENDECTOMY  1978   BIOPSY  07/08/2017   Procedure: BIOPSY;  Surgeon: Golda Claudis PENNER, MD;  Location: [facility];  Service: Endoscopy;;  gastric   CHOLECYSTECTOMY  2005   COLONOSCOPY WITH PROPOFOL  N/A 10/28/2017   Procedure: COLONOSCOPY WITH PROPOFOL ;  Surgeon: Golda Claudis PENNER, MD;  Location: [facility];  Service: Endoscopy;  Laterality: N/A;  7:30   EPIDURAL BLOCK INJECTION  11/09/2022   ESOPHAGOGASTRODUODENOSCOPY N/A 07/08/2017   Procedure: ESOPHAGOGASTRODUODENOSCOPY (EGD);  Surgeon: Golda Claudis PENNER, MD;  Location: [facility];  Service: Endoscopy;  Laterality: N/A;  220   HERNIA REPAIR  01/20/2024   umbilical   LUMBAR LAMINECTOMY/DECOMPRESSION MICRODISCECTOMY N/A 09/26/2018   Procedure: L4-5 MICRODISCECTOMY;  Surgeon: Barbarann Oneil JAYSON, MD;  Location: [facility];  Service: Orthopedics;  Laterality: N/A;   NASAL SEPTUM SURGERY     ROBOTIC ASSITED PARTIAL NEPHRECTOMY Right 05/17/2017   Procedure: XI ROBOTIC ASSITED PARTIAL NEPHRECTOMY;  Surgeon: Alvaro Hummer, MD;  Location: [facility];  Service: Urology;  Laterality: Right;   Ruptured Disk L5     SUBMANDIBULAR GLAND EXCISION  2022   THYROIDECTOMY N/A 03/10/2018   Procedure: TOTAL THYROIDECTOMY;  Surgeon: Mavis Oneil, MD;  Location: [facility];  Service: General;  Laterality: N/A;   Social History   Socioeconomic History   Marital status: Widowed    Spouse name: Not on file   Number of children: Not on file   Years of education: Not on file   Highest education level: Not on file  Occupational History   Not on file  Tobacco Use   Smoking status: Never  Smokeless tobacco: Never  Vaping Use   Vaping status: Never Used  Substance and Sexual Activity   Alcohol use: Yes    Comment: rarely    Drug use: No   Sexual activity: Not Currently  Other Topics Concern   Not on file  Social History Narrative   Not on file   Social Drivers of Health   Financial Resource Strain: Low Risk  (03/02/2024)   Received from [facility]   Overall Financial Resource Strain (CARDIA)    How hard is it for you to pay for the very basics like food, housing, medical care, and heating?: Not hard at all  Food Insecurity: No Food Insecurity (03/02/2024)   Received from [facility]   Hunger Vital Sign    Within the past 12 months, you worried that your food would run out before you got the money to buy more.: Never true    Within the past 12 months, the food you bought just didn't last and you didn't have money to get more.: Never true  Transportation Needs: No  Transportation Needs (03/02/2024)   Received from [facility]   PRAPARE - Transportation    Lack of Transportation (Medical): No    Lack of Transportation (Non-Medical): No  Physical Activity: Not on file  Stress: Not on file  Social Connections: Not on file   Outpatient Encounter Medications as of 03/05/2024  Medication Sig   diphenhydrAMINE  (BENADRYL ) 50 MG tablet Take 1 tablet (50 mg total) by mouth once for 1 dose. 1 hour prior to CT   HYDROcodone -acetaminophen  (NORCO/VICODIN) 5-325 MG tablet Take by mouth every 6 (six) hours as needed.   levothyroxine  (SYNTHROID ) 112 MCG tablet Take 1 tablet (112 mcg total) by mouth daily before breakfast.   losartan -hydrochlorothiazide  (HYZAAR) 100-12.5 MG tablet Take 1 tablet by mouth daily.   metFORMIN (GLUCOPHAGE) 500 MG tablet Take 500 mg by mouth 2 (two) times daily with a meal.   methocarbamol  (ROBAXIN ) 500 MG tablet Take 1 tablet (500 mg total) by mouth every 8 (eight) hours as needed for muscle spasms.   omeprazole (PRILOSEC) 20 MG capsule Take 20 mg by mouth daily as needed.   potassium chloride  SA (K-DUR,KLOR-CON ) 20 MEQ tablet TAKE 1 TABLET TWICE DAILY FOR 3 DAYS THEN 1 TABLET DAILY.   predniSONE  (DELTASONE ) 50 MG tablet Take 1 tablet 13 hrs, 7 hrs and 1 hr prior to CT   Semaglutide,0.25 or 0.5MG /DOS, (OZEMPIC, 0.25 OR 0.5 MG/DOSE,) 2 MG/3ML SOPN Inject 0.5 mg into the skin once a week.   simvastatin (ZOCOR) 40 MG tablet Take 40 mg by mouth daily at 6 PM.    [DISCONTINUED] levothyroxine  (SYNTHROID ) 125 MCG tablet Take 1 tablet (125 mcg total) by mouth daily before breakfast.   No facility-administered encounter medications on file as of 03/05/2024.   ALLERGIES: Allergies  Allergen Reactions   Iodinated Contrast Media Hives and Itching   Mango Flavoring Agent (Non-Screening) Swelling and Rash    Tongue swelling, feels like throat is closing in on her    Isovue [Iopamidol] Hives and Itching    And hives    Nitrofurantoin Hives    Other Hives, Itching, Rash and Other (See Comments)    Use Paper tape ONLY   Sulfa Antibiotics Hives   Adhesive [Tape] Rash and Other (See Comments)    Use Paper tape ONLY    VACCINATION STATUS: Immunization History  Administered Date(s) Administered   Influenza,inj,Quad PF,6+ Mos 06/06/2018   Influenza-Unspecified 06/24/2017,  06/11/2019   Moderna Sars-Covid-2 Vaccination 04/25/2020, 05/30/2020   Tdap 09/16/2018    HPI Rebecca Scott is 68 y.o. female who is returning for follow-up after recent near total thyroidectomy for thyroid  malignancy, and postsurgical hypothyroidism.    -She underwent near total thyroidectomy on March 10, 2018 which revealed bilateral follicular variant papillary thyroid  carcinoma Pathologic Stage Classification (pTNM, AJCC 8th Edition): pT2(multifocal), pNX.  She is currently on Synthroid  125 mcg p.o. daily before breakfast.  she reports compliance.  Her previsit thyroid  function tests are consistent with slight over replacement.   -Prior to her last visit labs showed undetectable thyroglobulin.    - She denies dysphagia, odynophagia, voice change, nor shortness of breath. See notes from her prior visits.  She underwent surveillance Thyrogen  stimulated whole-body scan on April 19, 2023 which showed no evidence of distant metastatic thyroid  carcinoma, however asymmetric activity in the right neck which favored asymmetric salivary gland tissue on the right .  -Note that her previous abnormalities on the left submandibular gland for which she underwent excision of 1.2 cm salivary gland cyst negative for malignancy.  She underwent previsit thyroid /neck ultrasound with some findings in the right upper thyroid  area.  See below.  She underwent neck CT scan on February 24, 2024 which is not remarkable-see below. Review of Systems Limited as above.  Objective:    BP 114/68   Pulse 68   Ht 5' 3 (1.6 m)   Wt 196 lb (88.9 kg)   BMI 34.72 kg/m   Wt Readings from  Last 3 Encounters:  03/05/24 196 lb (88.9 kg)  12/03/23 199 lb 9.6 oz (90.5 kg)  06/05/23 213 lb 3.2 oz (96.7 kg)     Diabetic Labs (most recent): Lab Results  Component Value Date   HGBA1C 5.1 09/08/2018   HGBA1C 5.4 03/06/2018   HGBA1C 5.7 (H) 05/09/2017     Recent Results (from the past 2160 hours)  TSH     Status: Abnormal   Collection Time: 02/19/24  9:39 AM  Result Value Ref Range   TSH 0.057 (L) 0.350 - 4.500 uIU/mL    Comment: Performed by a 3rd Generation assay with a functional sensitivity of <=0.01 uIU/mL. Performed at [facility], [address] [place], [place] [address]   T4, free     Status: Abnormal   Collection Time: 02/19/24  9:39 AM  Result Value Ref Range   Free T4 1.16 (H) 0.61 - 1.12 ng/dL    Comment: (NOTE) Biotin ingestion may interfere with free T4 tests. If the results are inconsistent with the TSH level, previous test results, or the clinical presentation, then consider biotin interference. If needed, order repeat testing after stopping biotin. Performed at [facility], [address] [place], [place] [address]   Thyroglobulin antibody     Status: None   Collection Time: 02/19/24  9:39 AM  Result Value Ref Range   Thyroglobulin Antibody <1.0 0.0 - 0.9 IU/mL    Comment: (NOTE) Thyroglobulin Antibody measured by [employer] Methodology It should be noted that the presence of thyroglobulin antibodies may not be pathogenic nor diagnostic, especially at very low levels. The assay manufacturer has found that four percent of individuals without evidence of thyroid  disease or autoimmunity will have positive TgAb levels up to 4 IU/mL. Performed At: [facility] [address] [place], [place] [id number] Jennette Shorter MD Ey:[id number]   Miscellaneous [facility] test (send-out)     Status: None   Collection Time: 02/19/24  9:39 AM  Result Value Ref Range   [facility] test code 500001    [facility] test name THYROGLUBULIN  LEVEL     Comment: Performed at [facility], [address] [place], [place] [address]   Misc [facility] result COMMENT     Comment: (NOTE) Test Ordered: 500001 Thyroglobulin (TG-RIA) Thyroglobulin (TG-RIA)         <2.0             ng/mL    ES   This test was developed and its performance characteristics determined by [facility]. It has not been cleared or approved by the Food and Drug Administration. Reference Range: Pubertal Children and Adults: <40 According to the [facility] of Clinical Biochemistry, the reference interval for Thyroglobulin (TG) should be related to euthyroid patients and not for patients who underwent thyroidectomy.  TG reference intervals for these patients depend on the residual mass of the thyroid  tissue left after surgery.  Establishing a post-operative baseline is recommended.  The assay quantitation limit is 2.0 ng/mL. Performed At: [facility] [address] [place], [place] [id number] Jennette Shorter MD Ey:[id number] Performed At: [facility] [address] [place], [place] [id number] Shermon Redell FALCON MD Ey:[id number]   I-STAT creatinine     Status: None   Collection Time: 02/24/24  2:09 PM  Result Value Ref Range   Creatinine, Ser 0.80 0.44 - 1.00 mg/dL       Diagnosis 1. Thyroid , lobectomy, left lobe and isthmus - PAPILLARY THYROID  CARCINOMA, FOLLICULAR VARIANT, 1.3 CM. - TUMOR CONFINED WITHIN THYROID  CAPSULE. - MARGINS NOT INVOLVED. 2. Thyroid , lobectomy, right lobe - PAPILLARY THYROID  CARCINOMA, FOLLICULAR VARIANT, 2.6 CM. - TUMOR FOCALLY LESS THAN 0.1 CM FROM MARGIN.   Pathologic Stage Classification (pTNM, AJCC 8th Edition): pT2(multifocal), pNX.   Thyrogen  stimulated whole-body scan on June 23, 2018 FINDINGS: Radio iodine accumulation is seen at the thyroid  bed bilaterally consistent with thyroid  remnant. No distant sites of abnormal radio iodine accumulation are identified to suggest  iodine-avid metastatic thyroid  cancer.   IMPRESSION: Thyroid  remnant.  No scintigraphic evidence of iodine-avid thyroid  cancer metastases.   Thyroid /neck ultrasound for Jan 13, 2019- consistent with surgical changes with absence of residual or recurrent thyroid  disease.     Most recent Thyrogen  stimulated whole-body scan on Feb 01, 2020 FINDINGS: A single small focus of increased radiotracer uptake within the lower left neck.   Normal physiologic uptake identified within the oropharynx and GI tract.   IMPRESSION: Single, small focus of increased radiotracer uptake noted within the left lower lobe neck worrisome for residual/recurrent functioning thyroid  tissue.    CT scan of neck on March 25, 2020: IMPRESSION: No mass or adenopathy. No definite finding corresponding to area of increased uptake on nuclear study.  Thyroid /neck ultrasound on September 15, 2020 IMPRESSION: 1. Surgical changes of prior thyroid  resection. 2. Small teardrop shaped focus of residual thyroid  tissue in the left resection bed measuring 0.8 x 1.2 x 0.6 cm. No evidence of thyroid  nodule. 3. Complex cyst within the left submandibular gland measures up to 1.2 x 0.8 x 0.8 cm. Differential considerations include cyst, focal ductal ectasia, abscess, and less likely, cystic neoplasm. Consider repeat left submandibular ultrasound in 3-6 months to assess for Stability.   May, 18 2022 Thyroid /neck surveillance  ultrasound IMPRESSION: 1. Surgical changes of total thyroidectomy. 2. Decreasing conspicuity of small focus of probable residual thyroid  tissue in the left resection bed. No evidence of thyroid  nodule. 3. Slight interval enlargement  of complex cyst within the left submandibular gland now measuring up to 1.5 cm compared to 1.2 cm previously. Given slight interval enlargement, consider fine-needle aspiration biopsy.  Fine-needle aspiration biopsy on March 09, 2021 at [facility] healthcare: Left submandibular  gland fine-needle aspiration - No malignant cells identified - Hypocellular specimen consistent with benign cyst fluid.  June 06, 2021 surgical excision of this lesion at [facility]  Diagnosis    A: Submandibular gland, left, excision - Submandibular gland with benign salivary gland cyst (1.2 cm) involved by mixed acute and chronic inflammation - Cyst is completely excised - Negative for malignancy    Surveillance Thyrogen  stimulated whole-body scan on April 19, 2023: IMPRESSION: 1. Asymmetric activity in the RIGHT neck as described above. Favor asymmetric salivary gland tissue. Recommend correlation with thyroglobulin levels. If elevated, recommend ultrasound evaluation of the neck. 2. No evidence of distant metastatic thyroid  carcinoma.  Thyroid /neck surveillance ultrasound on November 25, 2023 IMPRESSION: 1. Evaluation of the right submandibular space demonstrates a focus of homogeneous echogenic soft tissue measuring 2.2 x 0.9 x 2.1 cm medial to the submandibular gland. Findings suggest a focus of recurrent thyroid  tissue. No nodularity or microcalcifications. 2. Surgical changes of total thyroidectomy without evidence of residual or recurrent disease in the resection bed. 3. No suspicious lymphadenopathy.  CT neck on February 24, 2024 IMPRESSION: 1. Previous thyroidectomy. No evidence of residual thyroid  tissue or recurrent tumor. 2. No  lymphadenopathy. 3. Chronic fatty change of the parotid glands. Surgical absence of the left submandibular gland. No complicating feature seen on the left post submandibular resection. No right submandibular gland or submandibular space lesion. No pathologic finding to correlate with the ultrasound. 4. 4 mm sub solid density at the right apex, unchanged when compared to 2021, therefore benign.  Assessment & Plan:   1.  Follicular variant of papillary thyroid  cancer  -She is status post negative her thyroidectomy on March 10, 2018 with  Dr. Oneil Budge resulting in  Pathologic Stage Classification (pTNM, AJCC 8th Edition): pT2(multifocal), pNX.  -She he is status post radioactive iodine remnant ablation followed by whole body scan which was completed on June 24, 2018 at [facility]  -The whole body scan revealed some thyroid  remnant in the thyroid  bed, no evidence of distant metastasis.  Her previsit thyroid /neck ultrasound is consistent with surgical changes of absent residual or recurrent thyroid  disease.  -Her recent Thyrogen  stimulated whole-body scan showed single, small focus of increased radiotracer uptake within the left lower lobe worrisome for residual/recurrent functioning thyroid  tissue.    -Her previsit CT neck/thyroid  was unremarkable-no mass or adenopathy.  No definitive finding corresponding to the area of increased uptake on nuclear study.    September 15, 2020 thyroid /neck ultrasound confirming possible residual thyroid  tissue measuring 1.2 cm on the left thyroid  bed, and 1.2 cm complex cyst within the left submandibular gland differentials include ductal ectasia, abscess, cyst, or less likely cystic neoplasm.  -surgical excision : benign cyst, submandibular gland.  Prior to her last visit, thyroglobulin was undetectable.  Her previsit thyroglobulin level is still pending.    -Her previsit Thyrogen  stimulated whole-body scan did not show evidence of distant metastatic thyroid  carcinoma, however abnormal tracer finding on the right submandibular area.    -The description favored salivary gland on the whole-body scan.  She was offered repeat surveillance ultrasound which showed 2.2 cm possible thyroid  remnant.   Thyroglobulin is undetectable.    Her previsit surveillance CT neck on February 24, 2024 was [employer]  above.  -She willd not need any further intervention at this time. - I had a long discussion with her regarding the need for continued monitoring with yearly imaging studies for next 5-10  years.    2.  Postsurgical hypothyroidism  -Her previsit thyroid  function tests are consistent with slight over replacement.  I discussed and lowered her levothyroxine  to 112 mcg p.o. daily before breakfast.     - We discussed about the correct intake of her thyroid  hormone, on empty stomach at fasting, with water , separated by at least 30 minutes from breakfast and other medications,  and separated by more than 4 hours from calcium , iron, multivitamins, acid reflux medications (PPIs). -Patient is made aware of the fact that thyroid  hormone replacement is needed for life, dose to be adjusted by periodic monitoring of thyroid  function tests.   - I advised her  to maintain close follow up with Devora Sherrilyn Stagger, MD for primary care needs.   I spent  25  minutes in the care of the patient today including review of labs from Thyroid  Function, CMP, and other relevant labs ; imaging/biopsy records (current and previous including abstractions from other facilities); face-to-face time discussing  her lab results and symptoms, medications doses, her options of short and long term treatment based on the latest standards of care / guidelines;   and documenting the encounter.  Rebecca Scott  participated in the discussions, expressed understanding, and voiced agreement with the above plans.  All questions were answered to her satisfaction. she is encouraged to contact clinic should she have any questions or concerns prior to her return visit.   Follow up plan: Return in about 6 months (around 09/04/2024) for F/U with Pre-visit Labs.   Ranny Earl, MD [facility] [address] [place], [place] [address] Phone: [phone]  Fax: [phone]     03/05/2024, 4:44 PM  This note was partially dictated with voice recognition software. Similar sounding words can be transcribed inadequately or may not  be corrected upon review.

## 2024-06-24 ENCOUNTER — Encounter (INDEPENDENT_AMBULATORY_CARE_PROVIDER_SITE_OTHER): Payer: Self-pay | Admitting: Gastroenterology

## 2024-08-21 ENCOUNTER — Other Ambulatory Visit
Admission: RE | Admit: 2024-08-21 | Discharge: 2024-08-21 | Disposition: A | Attending: "Endocrinology | Admitting: "Endocrinology

## 2024-08-21 LAB — TSH: TSH: 1.14 u[IU]/mL (ref 0.350–4.500)

## 2024-08-21 LAB — T4, FREE: Free T4: 1.21 ng/dL — ABNORMAL HIGH (ref 0.61–1.12)

## 2024-08-22 LAB — THYROGLOBULIN ANTIBODY: Thyroglobulin Antibody: <1 [IU]/mL (ref 0.0–0.9)

## 2024-08-29 ENCOUNTER — Other Ambulatory Visit: Payer: Self-pay | Admitting: "Endocrinology

## 2024-08-29 LAB — MISC LABCORP TEST (SEND OUT): Labcorp test code: 500001

## 2024-08-31 ENCOUNTER — Encounter: Payer: Self-pay | Admitting: "Endocrinology

## 2024-08-31 ENCOUNTER — Ambulatory Visit: Admitting: "Endocrinology

## 2024-08-31 VITALS — BP 142/82 | HR 65 | Resp 18 | Ht 63.0 in | Wt 199.6 lb

## 2024-08-31 DIAGNOSIS — E890 Postprocedural hypothyroidism: Secondary | ICD-10-CM

## 2024-08-31 DIAGNOSIS — C73 Malignant neoplasm of thyroid gland: Secondary | ICD-10-CM | POA: Diagnosis not present

## 2024-08-31 NOTE — Progress Notes (Signed)
 "          Endocrinology follow-up note                                            08/31/2024, 2:21 PM   Subjective:    Patient ID: Rebecca Scott, female    DOB: June 26, 1956, PCP Devora Sherrilyn Stagger, MD   Past Medical History:  Diagnosis Date   Anemia    Anxiety    Arthritis    Asthma    seasonal asthma rarely    Chronic kidney disease    renal mass right kidney    CPAP (continuous positive airway pressure) dependence    Diverticulitis    Family history of cancer of extrahepatic bile ducts    Family history of colon cancer    Family history of kidney cancer    Family history of melanoma    GERD (gastroesophageal reflux disease)    Hemorrhoids    History of kidney stones    HTN (hypertension) 07/29/2014   Hyperlipemia    Hypertension    Migraine    no migraines now, will have stress headaches   Multinodular goiter 08/21/2017   Last Assessment & Plan:  She will need an f/u u/s of thyroid  to reassess nodules. Repeat in 07/2018 Nodules are too small for biopsy now.   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/2017   s/p partial nephrectomy, right   Papillary thyroid  carcinoma (HCC) 03/10/2018   PONV (postoperative nausea and vomiting)    patient reports being slower to wake than average     Pre-diabetes    ive been told i'm borderline    Sleep apnea    CPAP use    Past Surgical History:  Procedure Laterality Date   ABDOMINAL HYSTERECTOMY     APPENDECTOMY  1978   BIOPSY  07/08/2017   Procedure: BIOPSY;  Surgeon: Golda Claudis PENNER, MD;  Location: [facility];  Service: Endoscopy;;  gastric   CHOLECYSTECTOMY  2005   COLONOSCOPY WITH PROPOFOL  N/A 10/28/2017   Procedure: COLONOSCOPY WITH PROPOFOL ;  Surgeon: Golda Claudis PENNER, MD;  Location: [facility];  Service: Endoscopy;  Laterality: N/A;  7:30   EPIDURAL BLOCK INJECTION  11/09/2022   ESOPHAGOGASTRODUODENOSCOPY N/A 07/08/2017   Procedure: ESOPHAGOGASTRODUODENOSCOPY (EGD);  Surgeon: Golda Claudis PENNER, MD;  Location:  [facility];  Service: Endoscopy;  Laterality: N/A;  220   HERNIA REPAIR  01/20/2024   umbilical   LUMBAR LAMINECTOMY/DECOMPRESSION MICRODISCECTOMY N/A 09/26/2018   Procedure: L4-5 MICRODISCECTOMY;  Surgeon: Barbarann Oneil JAYSON, MD;  Location: [facility];  Service: Orthopedics;  Laterality: N/A;   NASAL SEPTUM SURGERY     ROBOTIC ASSITED PARTIAL NEPHRECTOMY Right 05/17/2017   Procedure: XI ROBOTIC ASSITED PARTIAL NEPHRECTOMY;  Surgeon: Alvaro Hummer, MD;  Location: [facility];  Service: Urology;  Laterality: Right;   Ruptured Disk L5     SUBMANDIBULAR GLAND EXCISION  2022   THYROIDECTOMY N/A 03/10/2018   Procedure: TOTAL THYROIDECTOMY;  Surgeon: Mavis Oneil, MD;  Location: [facility];  Service: General;  Laterality: N/A;   Social History   Socioeconomic History   Marital status: Widowed    Spouse name: Not on file   Number of children: Not on file   Years of education: Not on file   Highest education level: Not on file  Occupational History   Not on file  Tobacco Use  Smoking status: Never   Smokeless tobacco: Never  Vaping Use   Vaping status: Never Used  Substance and Sexual Activity   Alcohol use: Yes    Comment: rarely    Drug use: No   Sexual activity: Not Currently  Other Topics Concern   Not on file  Social History Narrative   Not on file   Social Drivers of Health   Tobacco Use: Low Risk (08/31/2024)   Patient History    Smoking Tobacco Use: Never    Smokeless Tobacco Use: Never    Passive Exposure: Not on file  Financial Resource Strain: Low Risk (03/02/2024)   Received from [facility]   Overall Financial Resource Strain (CARDIA)    How hard is it for you to pay for the very basics like food, housing, medical care, and heating?: Not hard at all  Food Insecurity: No Food Insecurity (03/02/2024)   Received from [facility]   Epic    Within the past 12 months, you worried that your food would run out before you got the money to buy more.: Never true    Within the  past 12 months, the food you bought just didn't last and you didn't have money to get more.: Never true  Transportation Needs: No Transportation Needs (03/02/2024)   Received from [facility]   PRAPARE - Transportation    Lack of Transportation (Medical): No    Lack of Transportation (Non-Medical): No  Physical Activity: Not on file  Stress: Not on file  Social Connections: Not on file  Depression (EYV7-0): Not on file  Alcohol Screen: Not on file  Housing: Not on file  Utilities: Low Risk (03/02/2024)   Received from [facility]   Utilities    Within the past 12 months, have you been unable to get utilities(heat, electricity) when it was really needed?: No  Health Literacy: Low Risk (06/26/2023)   Received from [facility] Literacy    How often do you need to have someone help you when you read instructions, pamphlets, or other written material from your doctor or pharmacy?: Never   Outpatient Encounter Medications as of 08/31/2024  Medication Sig   diphenhydrAMINE  (BENADRYL ) 50 MG tablet Take 1 tablet (50 mg total) by mouth once for 1 dose. 1 hour prior to CT   estradiol (ESTRACE) 0.01 % CREA vaginal cream Place vaginally.   HYDROcodone -acetaminophen  (NORCO/VICODIN) 5-325 MG tablet Take by mouth every 6 (six) hours as needed.   KLOR-CON  M15 15 MEQ tablet Take by mouth.   levothyroxine  (SYNTHROID ) 112 MCG tablet Take 1 tablet (112 mcg total) by mouth daily before breakfast.   losartan -hydrochlorothiazide  (HYZAAR) 100-12.5 MG tablet Take 1 tablet by mouth daily.   metFORMIN (GLUCOPHAGE) 500 MG tablet Take 500 mg by mouth 2 (two) times daily with a meal.   methocarbamol  (ROBAXIN ) 500 MG tablet Take 1 tablet (500 mg total) by mouth every 8 (eight) hours as needed for muscle spasms.   omeprazole (PRILOSEC) 20 MG capsule Take 20 mg by mouth daily as needed.   potassium chloride  SA (K-DUR,KLOR-CON ) 20 MEQ tablet TAKE 1 TABLET TWICE DAILY FOR 3 DAYS THEN 1 TABLET DAILY.    predniSONE  (DELTASONE ) 50 MG tablet Take 1 tablet 13 hrs, 7 hrs and 1 hr prior to CT   Semaglutide,0.25 or 0.5MG /DOS, (OZEMPIC, 0.25 OR 0.5 MG/DOSE,) 2 MG/3ML SOPN Inject 0.5 mg into the skin once a week.   simvastatin (ZOCOR) 40 MG tablet  Take 40 mg by mouth daily at 6 PM.    No facility-administered encounter medications on file as of 08/31/2024.   ALLERGIES: Allergies  Allergen Reactions   Iodinated Contrast Media Hives and Itching   Mango Flavoring Agent (Non-Screening) Swelling and Rash    Tongue swelling, feels like throat is closing in on her    Isovue [Iopamidol] Hives and Itching    And hives    Nitrofurantoin Hives   Other Hives, Itching, Rash and Other (See Comments)    Use Paper tape ONLY   Sulfa Antibiotics Hives   Adhesive [Tape] Rash and Other (See Comments)    Use Paper tape ONLY    VACCINATION STATUS: Immunization History  Administered Date(s) Administered   Influenza,inj,Quad PF,6+ Mos 06/06/2018   Influenza-Unspecified 06/24/2017, 06/11/2019   Moderna Sars-Covid-2 Vaccination 04/25/2020, 05/30/2020   Tdap 09/16/2018    HPI LELAR FAREWELL is 68 y.o. female who is returning for follow-up after recent near total thyroidectomy for thyroid  malignancy, and postsurgical hypothyroidism.    -She underwent near total thyroidectomy on March 10, 2018 which revealed bilateral follicular variant papillary thyroid  carcinoma Pathologic Stage Classification (pTNM, AJCC 8th Edition): pT2(multifocal), pNX.  She is currently on Synthroid  125 mcg p.o. daily before breakfast.  she reports compliance.  Her previsit thyroid  function tests are consistent with appropriate suppressive therapy.    - She denies dysphagia, odynophagia, voice change, nor shortness of breath. See notes from her prior visits.  She underwent surveillance Thyrogen  stimulated whole-body scan on April 19, 2023 which showed no evidence of distant metastatic thyroid  carcinoma, however asymmetric activity in the  right neck which favored asymmetric salivary gland tissue on the right .  -Note that her previous abnormalities on the left submandibular gland for which she underwent excision of 1.2 cm salivary gland cyst negative for malignancy.  he underwent previsit thyroid /neck ultrasound with some findings in the right upper thyroid  area.  See below.  Prior to her last visit in June 2025 she underwent CT neck on February 24, 2024 which is not remarkable-see below.  She continues to have detectable thyroglobulin levels.   Review of Systems Limited as above.  Objective:    BP (!) 142/82   Pulse 65   Resp 18   Ht 5' 3 (1.6 m)   Wt 199 lb 9.6 oz (90.5 kg)   SpO2 95%   BMI 35.36 kg/m   Wt Readings from Last 3 Encounters:  08/31/24 199 lb 9.6 oz (90.5 kg)  03/05/24 196 lb (88.9 kg)  12/03/23 199 lb 9.6 oz (90.5 kg)     Diabetic Labs (most recent): Lab Results  Component Value Date   HGBA1C 5.1 09/08/2018   HGBA1C 5.4 03/06/2018   HGBA1C 5.7 (H) 05/09/2017     Recent Results (from the past 2160 hours)  Thyroglobulin antibody     Status: None   Collection Time: 08/21/24 12:48 PM  Result Value Ref Range   Thyroglobulin Antibody <1.0 0.0 - 0.9 IU/mL    Comment: (NOTE) Thyroglobulin Antibody measured by [employer] Methodology It should be noted that the presence of thyroglobulin antibodies may not be pathogenic nor diagnostic, especially at very low levels. The assay manufacturer has found that four percent of individuals without evidence of thyroid  disease or autoimmunity will have positive TgAb levels up to 4 IU/mL. Performed At: [facility] [address] [place], [place] [id number] Jennette Shorter MD Ey:[id number]   TSH     Status: None   Collection Time:  08/21/24 12:48 PM  Result Value Ref Range   TSH 1.140 0.350 - 4.500 uIU/mL    Comment: Performed at [facility], [address] [place], [place] [address]  T4, free     Status: Abnormal   Collection  Time: 08/21/24 12:48 PM  Result Value Ref Range   Free T4 1.21 (H) 0.61 - 1.12 ng/dL    Comment: Performed at [facility], [address] [place], [place] [address]  Miscellaneous [facility] test (send-out)     Status: None   Collection Time: 08/21/24 12:48 PM  Result Value Ref Range   [facility] test code [id number]    [facility] test name THYROGLOBULIN LEVEL     Comment: Performed at [facility], [address] [place], [place] [address]   Misc [facility] result COMMENT     Comment: (NOTE) Test Ordered: 500001 Thyroglobulin (TG-RIA) Thyroglobulin (TG-RIA)         <2.0             ng/mL    ES   This test was developed and its performance characteristics determined by [facility]. It has not been cleared or approved by the Food and Drug Administration. Reference Range: Pubertal Children and Adults: <40 According to the [facility] of Clinical Biochemistry, the reference interval for Thyroglobulin (TG) should be related to euthyroid patients and not for patients who underwent thyroidectomy.  TG reference intervals for these patients depend on the residual mass of the thyroid  tissue left after surgery.  Establishing a post-operative baseline is recommended.  The assay quantitation limit is 2.0 ng/mL. Performed At: [facility] [address] [place], [place] [id number] Jennette Shorter MD Ey:[id number] Performed At: [facility] [address] [place], [place] [id number] Timmie Grieves MD Ey:[id number]        Diagnosis 1. Thyroid , lobectomy, left lobe and isthmus - PAPILLARY THYROID  CARCINOMA, FOLLICULAR VARIANT, 1.3 CM. - TUMOR CONFINED WITHIN THYROID  CAPSULE. - MARGINS NOT INVOLVED. 2. Thyroid , lobectomy, right lobe - PAPILLARY THYROID  CARCINOMA, FOLLICULAR VARIANT, 2.6 CM. - TUMOR FOCALLY LESS THAN 0.1 CM FROM MARGIN.   Pathologic Stage Classification (pTNM, AJCC 8th Edition): pT2(multifocal), pNX.   Thyrogen  stimulated whole-body scan  on June 23, 2018 FINDINGS: Radio iodine accumulation is seen at the thyroid  bed bilaterally consistent with thyroid  remnant. No distant sites of abnormal radio iodine accumulation are identified to suggest iodine-avid metastatic thyroid  cancer.   IMPRESSION: Thyroid  remnant.  No scintigraphic evidence of iodine-avid thyroid  cancer metastases.   Thyroid /neck ultrasound for Jan 13, 2019- consistent with surgical changes with absence of residual or recurrent thyroid  disease.     Most recent Thyrogen  stimulated whole-body scan on Feb 01, 2020 FINDINGS: A single small focus of increased radiotracer uptake within the lower left neck.   Normal physiologic uptake identified within the oropharynx and GI tract.   IMPRESSION: Single, small focus of increased radiotracer uptake noted within the left lower lobe neck worrisome for residual/recurrent functioning thyroid  tissue.    CT scan of neck on March 25, 2020: IMPRESSION: No mass or adenopathy. No definite finding corresponding to area of increased uptake on nuclear study.  Thyroid /neck ultrasound on September 15, 2020 IMPRESSION: 1. Surgical changes of prior thyroid  resection. 2. Small teardrop shaped focus of residual thyroid  tissue in the left resection bed measuring 0.8 x 1.2 x 0.6 cm. No evidence of thyroid  nodule. 3. Complex cyst within the left submandibular gland measures up to 1.2 x 0.8 x 0.8 cm. Differential considerations include  cyst, focal ductal ectasia, abscess, and less likely, cystic neoplasm. Consider repeat left submandibular ultrasound in 3-6 months to assess for Stability.   May, 18 2022 Thyroid /neck surveillance  ultrasound IMPRESSION: 1. Surgical changes of total thyroidectomy. 2. Decreasing conspicuity of small focus of probable residual thyroid  tissue in the left resection bed. No evidence of thyroid  nodule. 3. Slight interval enlargement of complex cyst within the left submandibular gland now  measuring up to 1.5 cm compared to 1.2 cm previously. Given slight interval enlargement, consider fine-needle aspiration biopsy.  Fine-needle aspiration biopsy on March 09, 2021 at [facility]: Left submandibular gland fine-needle aspiration - No malignant cells identified - Hypocellular specimen consistent with benign cyst fluid.  June 06, 2021 surgical excision of this lesion at [facility]  Diagnosis    A: Submandibular gland, left, excision - Submandibular gland with benign salivary gland cyst (1.2 cm) involved by mixed acute and chronic inflammation - Cyst is completely excised - Negative for malignancy    Surveillance Thyrogen  stimulated whole-body scan on April 19, 2023: IMPRESSION: 1. Asymmetric activity in the RIGHT neck as described above. Favor asymmetric salivary gland tissue. Recommend correlation with thyroglobulin levels. If elevated, recommend ultrasound evaluation of the neck. 2. No evidence of distant metastatic thyroid  carcinoma.  Thyroid /neck surveillance ultrasound on November 25, 2023 IMPRESSION: 1. Evaluation of the right submandibular space demonstrates a focus of homogeneous echogenic soft tissue measuring 2.2 x 0.9 x 2.1 cm medial to the submandibular gland. Findings suggest a focus of recurrent thyroid  tissue. No nodularity or microcalcifications. 2. Surgical changes of total thyroidectomy without evidence of residual or recurrent disease in the resection bed. 3. No suspicious lymphadenopathy.  CT neck on February 24, 2024 IMPRESSION: 1. Previous thyroidectomy. No evidence of residual thyroid  tissue or recurrent tumor. 2. No  lymphadenopathy. 3. Chronic fatty change of the parotid glands. Surgical absence of the left submandibular gland. No complicating feature seen on the left post submandibular resection. No right submandibular gland or submandibular space lesion. No pathologic finding to correlate with the ultrasound. 4. 4 mm sub solid density  at the right apex, unchanged when compared to 2021, therefore benign.  Assessment & Plan:   1.  Follicular variant of papillary thyroid  cancer  -She is status post negative her thyroidectomy on March 10, 2018 with Dr. Oneil Budge resulting in  Pathologic Stage Classification (pTNM, AJCC 8th Edition): pT2(multifocal), pNX.  -She he is status post radioactive iodine remnant ablation followed by whole body scan which was completed on June 24, 2018 at [facility].  -The whole body scan revealed some thyroid  remnant in the thyroid  bed, no evidence of distant metastasis.  Her previsit thyroid /neck ultrasound is consistent with surgical changes of absent residual or recurrent thyroid  disease.  -Her recent Thyrogen  stimulated whole-body scan showed single, small focus of increased radiotracer uptake within the left lower lobe worrisome for residual/recurrent functioning thyroid  tissue.    -Her previsit CT neck/thyroid  was unremarkable-no mass or adenopathy.  No definitive finding corresponding to the area of increased uptake on nuclear study.    September 15, 2020 thyroid /neck ultrasound confirming possible residual thyroid  tissue measuring 1.2 cm on the left thyroid  bed, and 1.2 cm complex cyst within the left submandibular gland differentials include ductal ectasia, abscess, cyst, or less likely cystic neoplasm.  -surgical excision : benign cyst, submandibular gland.  Prior to her last visit, thyroglobulin was undetectable.  Her previsit thyroglobulin level is still pending.    -Her previsit Thyrogen  stimulated whole-body scan  did not show evidence of distant metastatic thyroid  carcinoma, however abnormal tracer finding on the right submandibular area.    -The description favored salivary gland on the whole-body scan.  She was offered repeat surveillance ultrasound which showed 2.2 cm possible thyroid  remnant.   Thyroglobulin is undetectable.    Her previsit surveillance CT neck on February 24, 2024 was unremarkable-see above.  -She willd not need any further intervention at this time.  She continued to have undetectable thyroglobulin levels. - I had a long discussion with her regarding the need for continued monitoring with yearly imaging studies for next 5-10 years.    2.  Postsurgical hypothyroidism  -Her previsit thyroid  function tests are consistent with appropriate suppressive therapy.  She is advised to continue levothyroxine  120 mcg p.o. daily before breakfast.     - We discussed about the correct intake of her thyroid  hormone, on empty stomach at fasting, with water , separated by at least 30 minutes from breakfast and other medications,  and separated by more than 4 hours from calcium , iron, multivitamins, acid reflux medications (PPIs). -Patient is made aware of the fact that thyroid  hormone replacement is needed for life, dose to be adjusted by periodic monitoring of thyroid  function tests.    - I advised her  to maintain close follow up with Devora Sherrilyn Stagger, MD for primary care needs.   I spent  20  minutes in the care of the patient today including review of labs from Thyroid  Function, CMP, and other relevant labs ; imaging/biopsy records (current and previous including abstractions from other facilities); face-to-face time discussing  her lab results and symptoms, medications doses, her options of short and long term treatment based on the latest standards of care / guidelines;   and documenting the encounter.  Rebecca Scott  participated in the discussions, expressed understanding, and voiced agreement with the above plans.  All questions were answered to her satisfaction. she is encouraged to contact clinic should she have any questions or concerns prior to her return visit.   Follow up plan: Return in about 6 months (around 03/01/2025) for F/U with Pre-visit Labs.   Ranny Earl, MD [facility] [address] [place], [place] [address] Phone: [phone]  Fax: [phone]     08/31/2024, 2:21 PM  This note was partially dictated with voice recognition software. Similar sounding words can be transcribed inadequately or may not  be corrected upon review.  "

## 2024-10-09 ENCOUNTER — Other Ambulatory Visit: Payer: Self-pay | Admitting: "Endocrinology

## 2024-10-09 ENCOUNTER — Encounter: Payer: Self-pay | Admitting: "Endocrinology

## 2024-10-12 ENCOUNTER — Other Ambulatory Visit: Payer: Self-pay | Admitting: "Endocrinology

## 2024-10-13 ENCOUNTER — Other Ambulatory Visit: Payer: Self-pay | Admitting: "Endocrinology

## 2024-10-13 MED ORDER — LEVOTHYROXINE SODIUM 125 MCG PO TABS: 125.0000 ug | ORAL_TABLET | Freq: Every day | ORAL | 1 refills | Status: AC

## 2025-03-09 ENCOUNTER — Ambulatory Visit: Admitting: "Endocrinology
